# Patient Record
Sex: Female | Born: 1955 | Race: Black or African American | Hispanic: No | Marital: Single | Smoking: Current every day smoker
Health system: Southern US, Community
[De-identification: ages and names within clinical notes are randomized; demographics above are authoritative.]

## PROBLEM LIST (undated history)

## (undated) DIAGNOSIS — N289 Disorder of kidney and ureter, unspecified: Secondary | ICD-10-CM

## (undated) DIAGNOSIS — M329 Systemic lupus erythematosus, unspecified: Secondary | ICD-10-CM

## (undated) DIAGNOSIS — N184 Chronic kidney disease, stage 4 (severe): Secondary | ICD-10-CM

## (undated) DIAGNOSIS — I10 Essential (primary) hypertension: Secondary | ICD-10-CM

## (undated) DIAGNOSIS — I509 Heart failure, unspecified: Secondary | ICD-10-CM

## (undated) DIAGNOSIS — R768 Other specified abnormal immunological findings in serum: Secondary | ICD-10-CM

## (undated) DIAGNOSIS — R7689 Other specified abnormal immunological findings in serum: Secondary | ICD-10-CM

## (undated) DIAGNOSIS — F419 Anxiety disorder, unspecified: Secondary | ICD-10-CM

## (undated) DIAGNOSIS — F1910 Other psychoactive substance abuse, uncomplicated: Secondary | ICD-10-CM

## (undated) DIAGNOSIS — F1410 Cocaine abuse, uncomplicated: Secondary | ICD-10-CM

## (undated) DIAGNOSIS — IMO0002 Reserved for concepts with insufficient information to code with codable children: Secondary | ICD-10-CM

## (undated) DIAGNOSIS — Z5189 Encounter for other specified aftercare: Secondary | ICD-10-CM

## (undated) HISTORY — DX: Cocaine abuse, uncomplicated: F14.10

## (undated) HISTORY — DX: Other specified abnormal immunological findings in serum: R76.89

## (undated) HISTORY — DX: Other psychoactive substance abuse, uncomplicated: F19.10

## (undated) HISTORY — DX: Other specified abnormal immunological findings in serum: R76.8

## (undated) HISTORY — DX: Chronic kidney disease, stage 4 (severe): N18.4

## (undated) HISTORY — DX: Essential (primary) hypertension: I10

---

## 1997-10-01 ENCOUNTER — Emergency Department (HOSPITAL_COMMUNITY): Admission: EM | Admit: 1997-10-01 | Discharge: 1997-10-01 | Payer: Self-pay | Admitting: Emergency Medicine

## 1998-02-22 ENCOUNTER — Emergency Department (HOSPITAL_COMMUNITY): Admission: EM | Admit: 1998-02-22 | Discharge: 1998-02-22 | Payer: Self-pay | Admitting: Emergency Medicine

## 1998-10-28 ENCOUNTER — Emergency Department (HOSPITAL_COMMUNITY): Admission: EM | Admit: 1998-10-28 | Discharge: 1998-10-28 | Payer: Self-pay | Admitting: *Deleted

## 1999-10-03 ENCOUNTER — Encounter: Payer: Self-pay | Admitting: Emergency Medicine

## 1999-10-03 ENCOUNTER — Emergency Department (HOSPITAL_COMMUNITY): Admission: EM | Admit: 1999-10-03 | Discharge: 1999-10-03 | Payer: Self-pay | Admitting: Emergency Medicine

## 2001-11-29 ENCOUNTER — Encounter: Payer: Self-pay | Admitting: Emergency Medicine

## 2001-11-29 ENCOUNTER — Emergency Department (HOSPITAL_COMMUNITY): Admission: EM | Admit: 2001-11-29 | Discharge: 2001-11-29 | Payer: Self-pay | Admitting: Emergency Medicine

## 2002-01-11 ENCOUNTER — Emergency Department (HOSPITAL_COMMUNITY): Admission: EM | Admit: 2002-01-11 | Discharge: 2002-01-11 | Payer: Self-pay | Admitting: Emergency Medicine

## 2002-08-18 ENCOUNTER — Encounter: Payer: Self-pay | Admitting: Emergency Medicine

## 2002-08-18 ENCOUNTER — Inpatient Hospital Stay (HOSPITAL_COMMUNITY): Admission: EM | Admit: 2002-08-18 | Discharge: 2002-08-25 | Payer: Self-pay | Admitting: *Deleted

## 2002-11-07 ENCOUNTER — Emergency Department (HOSPITAL_COMMUNITY): Admission: EM | Admit: 2002-11-07 | Discharge: 2002-11-07 | Payer: Self-pay | Admitting: Emergency Medicine

## 2002-11-08 ENCOUNTER — Ambulatory Visit (HOSPITAL_BASED_OUTPATIENT_CLINIC_OR_DEPARTMENT_OTHER): Admission: RE | Admit: 2002-11-08 | Discharge: 2002-11-09 | Payer: Self-pay | Admitting: Orthopedic Surgery

## 2002-11-08 ENCOUNTER — Encounter: Payer: Self-pay | Admitting: Emergency Medicine

## 2003-02-09 ENCOUNTER — Emergency Department (HOSPITAL_COMMUNITY): Admission: EM | Admit: 2003-02-09 | Discharge: 2003-02-09 | Payer: Self-pay | Admitting: Emergency Medicine

## 2003-02-09 ENCOUNTER — Encounter: Payer: Self-pay | Admitting: Emergency Medicine

## 2003-02-10 ENCOUNTER — Inpatient Hospital Stay (HOSPITAL_COMMUNITY): Admission: EM | Admit: 2003-02-10 | Discharge: 2003-02-18 | Payer: Self-pay | Admitting: Psychiatry

## 2004-07-15 ENCOUNTER — Emergency Department (HOSPITAL_COMMUNITY): Admission: EM | Admit: 2004-07-15 | Discharge: 2004-07-15 | Payer: Self-pay | Admitting: Emergency Medicine

## 2004-07-24 ENCOUNTER — Inpatient Hospital Stay (HOSPITAL_COMMUNITY): Admission: RE | Admit: 2004-07-24 | Discharge: 2004-07-30 | Payer: Self-pay | Admitting: Psychiatry

## 2004-07-24 ENCOUNTER — Ambulatory Visit: Payer: Self-pay | Admitting: Psychiatry

## 2007-07-13 ENCOUNTER — Emergency Department (HOSPITAL_COMMUNITY): Admission: EM | Admit: 2007-07-13 | Discharge: 2007-07-13 | Payer: Self-pay | Admitting: Emergency Medicine

## 2008-06-07 ENCOUNTER — Emergency Department (HOSPITAL_COMMUNITY): Admission: EM | Admit: 2008-06-07 | Discharge: 2008-06-07 | Payer: Self-pay | Admitting: Emergency Medicine

## 2008-06-16 ENCOUNTER — Ambulatory Visit: Payer: Self-pay | Admitting: Internal Medicine

## 2008-06-20 ENCOUNTER — Ambulatory Visit: Payer: Self-pay | Admitting: *Deleted

## 2008-07-03 ENCOUNTER — Emergency Department (HOSPITAL_COMMUNITY): Admission: EM | Admit: 2008-07-03 | Discharge: 2008-07-03 | Payer: Self-pay | Admitting: Emergency Medicine

## 2008-09-01 ENCOUNTER — Ambulatory Visit: Payer: Self-pay | Admitting: Internal Medicine

## 2008-09-01 ENCOUNTER — Encounter (INDEPENDENT_AMBULATORY_CARE_PROVIDER_SITE_OTHER): Payer: Self-pay | Admitting: Internal Medicine

## 2008-09-01 LAB — CONVERTED CEMR LAB
ALT: 17 units/L (ref 0–35)
AST: 16 units/L (ref 0–37)
Albumin: 4.4 g/dL (ref 3.5–5.2)
Anti Nuclear Antibody(ANA): POSITIVE — AB
Basophils Absolute: 0 10*3/uL (ref 0.0–0.1)
Calcium: 9.5 mg/dL (ref 8.4–10.5)
Creatinine, Ser: 1.02 mg/dL (ref 0.40–1.20)
Eosinophils Relative: 3 % (ref 0–5)
Glucose, Bld: 100 mg/dL — ABNORMAL HIGH (ref 70–99)
MCHC: 33.6 g/dL (ref 30.0–36.0)
Monocytes Relative: 13 % — ABNORMAL HIGH (ref 3–12)
Neutrophils Relative %: 57 % (ref 43–77)
Potassium: 4.1 meq/L (ref 3.5–5.3)
RDW: 14.3 % (ref 11.5–15.5)
Total Protein: 8 g/dL (ref 6.0–8.3)
WBC: 6 10*3/uL (ref 4.0–10.5)

## 2008-09-23 ENCOUNTER — Encounter: Admission: RE | Admit: 2008-09-23 | Discharge: 2008-09-23 | Payer: Self-pay | Admitting: Internal Medicine

## 2008-12-26 ENCOUNTER — Ambulatory Visit: Payer: Self-pay | Admitting: Family Medicine

## 2009-12-28 ENCOUNTER — Emergency Department (HOSPITAL_COMMUNITY): Admission: EM | Admit: 2009-12-28 | Discharge: 2009-12-28 | Payer: Self-pay | Admitting: Emergency Medicine

## 2010-01-01 ENCOUNTER — Emergency Department (HOSPITAL_COMMUNITY): Admission: EM | Admit: 2010-01-01 | Discharge: 2010-01-01 | Payer: Self-pay | Admitting: Emergency Medicine

## 2010-01-01 ENCOUNTER — Telehealth (INDEPENDENT_AMBULATORY_CARE_PROVIDER_SITE_OTHER): Payer: Self-pay | Admitting: *Deleted

## 2010-01-03 ENCOUNTER — Emergency Department (HOSPITAL_COMMUNITY): Admission: EM | Admit: 2010-01-03 | Discharge: 2010-01-03 | Payer: Self-pay | Admitting: Emergency Medicine

## 2010-07-08 ENCOUNTER — Encounter: Payer: Self-pay | Admitting: Internal Medicine

## 2010-07-19 NOTE — Progress Notes (Signed)
Summary: triage/bloody urine/clots/pain  Phone Note Call from Patient   Caller: Patient Reason for Call: Talk to Nurse Summary of Call: Patient presents to office wanting to see a provider for bright red blood and clots in urine when she tries to urinate. She also states she has a small amount of bloody drainage when she isn't trying to urinate..Patient walking slightly slumped over guarding lower abd. where she states she is having pain. She was seen at ED on 7/14 and treated for UTI with Macrobid and Pyridium. She states she did not see this amount of blood in her urine at that time, and the pain has gotten worse. Patient advised to return to ED as we do not have any openings until 3:45  this afternoon and she needs further evaluation before then.  Patient states understanding and will return to ED via cab. Initial call taken by: Verne Spurr,  January 01, 2010 9:40 AM

## 2010-09-01 LAB — GC/CHLAMYDIA PROBE AMP, GENITAL
Chlamydia, DNA Probe: NEGATIVE
GC Probe Amp, Genital: NEGATIVE

## 2010-09-01 LAB — POCT I-STAT, CHEM 8
Calcium, Ion: 1.11 mmol/L — ABNORMAL LOW (ref 1.12–1.32)
Chloride: 110 mEq/L (ref 96–112)
Creatinine, Ser: 1.8 mg/dL — ABNORMAL HIGH (ref 0.4–1.2)
Hemoglobin: 14.6 g/dL (ref 12.0–15.0)
Sodium: 142 mEq/L (ref 135–145)

## 2010-09-01 LAB — URINE CULTURE: Colony Count: 25000

## 2010-09-01 LAB — URINALYSIS, ROUTINE W REFLEX MICROSCOPIC
Bilirubin Urine: NEGATIVE
Glucose, UA: 100 mg/dL — AB
Glucose, UA: NEGATIVE mg/dL
Ketones, ur: NEGATIVE mg/dL
Leukocytes, UA: NEGATIVE
Nitrite: NEGATIVE
Protein, ur: 100 mg/dL — AB
Specific Gravity, Urine: 1.023 (ref 1.005–1.030)
Specific Gravity, Urine: 1.006 (ref 1.005–1.030)
pH: 6.5 (ref 5.0–8.0)
pH: 6.5 (ref 5.0–8.0)

## 2010-09-01 LAB — WET PREP, GENITAL
Trich, Wet Prep: NONE SEEN
Yeast Wet Prep HPF POC: NONE SEEN

## 2010-09-01 LAB — DIFFERENTIAL
Basophils Absolute: 0.1 10*3/uL (ref 0.0–0.1)
Basophils Relative: 1 % (ref 0–1)
Eosinophils Relative: 2 % (ref 0–5)
Lymphocytes Relative: 23 % (ref 12–46)
Lymphs Abs: 1.1 10*3/uL (ref 0.7–4.0)
Monocytes Absolute: 0.4 10*3/uL (ref 0.1–1.0)
Monocytes Relative: 8 % (ref 3–12)

## 2010-09-01 LAB — CBC
MCH: 31.4 pg (ref 26.0–34.0)
RBC: 4.51 MIL/uL (ref 3.87–5.11)
RDW: 13.6 % (ref 11.5–15.5)

## 2010-09-01 LAB — URINE MICROSCOPIC-ADD ON

## 2010-09-01 LAB — PREGNANCY, URINE: Preg Test, Ur: NEGATIVE

## 2010-09-02 LAB — URINE MICROSCOPIC-ADD ON

## 2010-09-02 LAB — POCT I-STAT, CHEM 8
BUN: 21 mg/dL (ref 6–23)
Calcium, Ion: 1.18 mmol/L (ref 1.12–1.32)
Chloride: 111 meq/L (ref 96–112)
Creatinine, Ser: 1.4 mg/dL — ABNORMAL HIGH (ref 0.4–1.2)
Glucose, Bld: 87 mg/dL (ref 70–99)
HCT: 46 % (ref 36.0–46.0)
Hemoglobin: 15.6 g/dL — ABNORMAL HIGH (ref 12.0–15.0)
Potassium: 4.1 meq/L (ref 3.5–5.1)
Sodium: 145 meq/L (ref 135–145)
TCO2: 28 mmol/L (ref 0–100)

## 2010-09-02 LAB — URINALYSIS, ROUTINE W REFLEX MICROSCOPIC
Glucose, UA: NEGATIVE mg/dL
Urobilinogen, UA: 0.2 mg/dL (ref 0.0–1.0)
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
## 2011-03-07 LAB — POCT CARDIAC MARKERS
Myoglobin, poc: 83.3
Operator id: 151321

## 2011-03-07 LAB — CBC
HCT: 44.3
Hemoglobin: 14.6
MCHC: 33.1
MCV: 93.3
Platelets: 221
RBC: 4.75
RDW: 13.1
WBC: 4.4

## 2011-03-07 LAB — DIFFERENTIAL
Basophils Absolute: 0.1
Basophils Relative: 1
Eosinophils Absolute: 0.1
Eosinophils Relative: 2
Lymphocytes Relative: 46
Lymphs Abs: 2
Monocytes Absolute: 0.5
Monocytes Relative: 11
Neutro Abs: 1.8
Neutrophils Relative %: 40 — ABNORMAL LOW

## 2011-03-07 LAB — POCT I-STAT CREATININE
Creatinine, Ser: 1.1
Operator id: 151321

## 2011-03-07 LAB — I-STAT 8, (EC8 V) (CONVERTED LAB)
Acid-Base Excess: 1
BUN: 14
Bicarbonate: 27.3 — ABNORMAL HIGH
Glucose, Bld: 83
HCT: 49 — ABNORMAL HIGH
Operator id: 151321
pH, Ven: 7.346 — ABNORMAL HIGH

## 2011-03-07 LAB — D-DIMER, QUANTITATIVE: D-Dimer, Quant: <0.22

## 2011-03-21 LAB — URINALYSIS, ROUTINE W REFLEX MICROSCOPIC
Glucose, UA: NEGATIVE mg/dL
Hgb urine dipstick: NEGATIVE
Ketones, ur: NEGATIVE mg/dL
pH: 6 (ref 5.0–8.0)

## 2011-03-21 LAB — POCT I-STAT 3, ART BLOOD GAS (G3+)
Bicarbonate: 25.8 mEq/L — ABNORMAL HIGH (ref 20.0–24.0)
O2 Saturation: 93 %
TCO2: 27 mmol/L (ref 0–100)
pCO2 arterial: 42.7 mmHg (ref 35.0–45.0)
pH, Arterial: 7.389 (ref 7.350–7.400)

## 2011-03-21 LAB — WET PREP, GENITAL
Clue Cells Wet Prep HPF POC: NONE SEEN
Trich, Wet Prep: NONE SEEN

## 2011-03-21 LAB — URINE CULTURE: Colony Count: 7000

## 2012-10-15 ENCOUNTER — Emergency Department (HOSPITAL_COMMUNITY)
Admission: EM | Admit: 2012-10-15 | Discharge: 2012-10-15 | Disposition: A | Discharge status: Home / Self Care | Payer: Medicaid Other | Attending: Emergency Medicine | Admitting: Emergency Medicine

## 2012-10-15 ENCOUNTER — Emergency Department (HOSPITAL_COMMUNITY): Payer: Medicaid Other

## 2012-10-15 ENCOUNTER — Encounter (HOSPITAL_COMMUNITY): Payer: Self-pay | Admitting: Emergency Medicine

## 2012-10-15 DIAGNOSIS — Z23 Encounter for immunization: Secondary | ICD-10-CM

## 2012-10-15 DIAGNOSIS — S71109A Unspecified open wound, unspecified thigh, initial encounter: Secondary | ICD-10-CM | POA: Insufficient documentation

## 2012-10-15 DIAGNOSIS — S71009A Unspecified open wound, unspecified hip, initial encounter: Secondary | ICD-10-CM | POA: Insufficient documentation

## 2012-10-15 DIAGNOSIS — Y92410 Unspecified street and highway as the place of occurrence of the external cause: Secondary | ICD-10-CM | POA: Insufficient documentation

## 2012-10-15 DIAGNOSIS — W540XXA Bitten by dog, initial encounter: Secondary | ICD-10-CM | POA: Insufficient documentation

## 2012-10-15 DIAGNOSIS — Y9301 Activity, walking, marching and hiking: Secondary | ICD-10-CM | POA: Insufficient documentation

## 2012-10-15 DIAGNOSIS — S71151A Open bite, right thigh, initial encounter: Secondary | ICD-10-CM

## 2012-10-15 DIAGNOSIS — IMO0002 Reserved for concepts with insufficient information to code with codable children: Secondary | ICD-10-CM | POA: Insufficient documentation

## 2012-10-15 MED ORDER — RABIES IMMUNE GLOBULIN 150 UNIT/ML IM INJ
20.0000 [IU]/kg | INJECTION | Freq: Once | INTRAMUSCULAR | Status: AC
  Administered 2012-10-15: 1050 [IU] via INTRAMUSCULAR
  Filled 2012-10-15: qty 8

## 2012-10-15 MED ORDER — AMOXICILLIN-POT CLAVULANATE 500-125 MG PO TABS: 1.0000 | ORAL_TABLET | Freq: Three times a day (TID) | ORAL | Status: DC

## 2012-10-15 MED ORDER — TETANUS-DIPHTH-ACELL PERTUSSIS 5-2.5-18.5 LF-MCG/0.5 IM SUSP
0.5000 mL | Freq: Once | INTRAMUSCULAR | Status: AC
  Administered 2012-10-15: 0.5 mL via INTRAMUSCULAR
  Filled 2012-10-15: qty 0.5

## 2012-10-15 MED ORDER — LIDOCAINE-EPINEPHRINE 1 %-1:100000 IJ SOLN
10.0000 mL | Freq: Once | INTRAMUSCULAR | Status: AC
  Administered 2012-10-15: 10 mL via INTRADERMAL
  Filled 2012-10-15: qty 1

## 2012-10-15 MED ORDER — HYDROCODONE-ACETAMINOPHEN 5-325 MG PO TABS: 1.0000 | ORAL_TABLET | Freq: Four times a day (QID) | ORAL | Status: DC | PRN

## 2012-10-15 MED ORDER — RABIES VACCINE, PCEC IM SUSR
1.0000 mL | Freq: Once | INTRAMUSCULAR | Status: AC
  Administered 2012-10-15: 1 mL via INTRAMUSCULAR
  Filled 2012-10-15: qty 1

## 2012-10-15 MED ORDER — OXYCODONE-ACETAMINOPHEN 5-325 MG PO TABS
2.0000 | ORAL_TABLET | Freq: Once | ORAL | Status: AC
  Administered 2012-10-15: 2 via ORAL
  Filled 2012-10-15: qty 2
  Filled 2012-10-15: qty 1

## 2012-10-15 NOTE — ED Provider Notes (Signed)
Pt with acute onset of dog bite this AM - states she was bit by unknown dog to the R leg and the R forearm (scratch).  She has hx of lupus - no immunosuppression at thsi time.  This attack was unprovoked.  On exam has large tissue defect to the R anterior thigh X 2, no sutures possible, no active bleeding, no obvious FB's.  No repair indicated as there is missing large piece of tissue - irrgate, cleanse, and cover with sterile gauze - abx, rabies tx and f/u for ongoing rabies tx.    I saw and evaluated the patient, reviewed the resident's note and I agree with the findings and plan.   Johnna Acosta, MD 10/15/12 [phone]

## 2012-10-15 NOTE — ED Notes (Signed)
Patient states that she was walking down the road and got bit by an unknown dog.   Patient unsure of kind or whose dog it is.   Patient states that she is not interested in reporting bite.
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
## 2012-10-16 NOTE — ED Provider Notes (Signed)
Medical screening examination/treatment/procedure(s) were conducted as a shared visit with resident and myself.  I personally evaluated the patient during the encounter.  Please see my separate respective documentation pertaining to this patient encounter   Johnna Acosta, MD 10/16/12 [phone]

## 2012-10-18 ENCOUNTER — Emergency Department (INDEPENDENT_AMBULATORY_CARE_PROVIDER_SITE_OTHER)
Admission: EM | Admit: 2012-10-18 | Discharge: 2012-10-18 | Disposition: A | Discharge status: Home / Self Care | Payer: Medicaid Other | Source: Home / Self Care

## 2012-10-18 DIAGNOSIS — Z203 Contact with and (suspected) exposure to rabies: Secondary | ICD-10-CM

## 2012-10-18 MED ORDER — RABIES VACCINE, PCEC IM SUSR
INTRAMUSCULAR | Status: AC
  Filled 2012-10-18: qty 1

## 2012-10-18 MED ORDER — RABIES VACCINE, PCEC IM SUSR
1.0000 mL | Freq: Once | INTRAMUSCULAR | Status: AC
  Administered 2012-10-18: 1 mL via INTRAMUSCULAR

## 2012-10-18 NOTE — ED Notes (Signed)
Patient presents for day 3 of rabies treatment.

## 2012-10-22 ENCOUNTER — Emergency Department (INDEPENDENT_AMBULATORY_CARE_PROVIDER_SITE_OTHER)
Admission: EM | Admit: 2012-10-22 | Discharge: 2012-10-22 | Disposition: A | Discharge status: Home / Self Care | Payer: Medicaid Other | Source: Home / Self Care

## 2012-10-22 ENCOUNTER — Encounter (HOSPITAL_COMMUNITY): Payer: Self-pay | Admitting: Emergency Medicine

## 2012-10-22 DIAGNOSIS — Z203 Contact with and (suspected) exposure to rabies: Secondary | ICD-10-CM

## 2012-10-22 MED ORDER — RABIES VACCINE, PCEC IM SUSR
1.0000 mL | Freq: Once | INTRAMUSCULAR | Status: AC
  Administered 2012-10-22: 1 mL via INTRAMUSCULAR

## 2012-10-22 MED ORDER — RABIES VACCINE, PCEC IM SUSR
INTRAMUSCULAR | Status: AC
  Filled 2012-10-22: qty 1

## 2012-10-22 MED ORDER — HYDROCODONE-ACETAMINOPHEN 5-325 MG PO TABS: 1.0000 | ORAL_TABLET | Freq: Three times a day (TID) | ORAL | Status: DC | PRN

## 2012-10-22 NOTE — ED Notes (Addendum)
Wound was assessed by Dr. Gunnar Bulla no signs of infection.  Wound was redressed with antibiotic ointment, telfa and kerlix wrap. Pt given refill on pain medication.  Mw,cma

## 2012-10-22 NOTE — ED Notes (Signed)
Pt is here for rabies injection.  Pt states that she is still having a lot of pain from dog bite and is requesting refill on pain medication.

## 2013-03-29 ENCOUNTER — Emergency Department (HOSPITAL_COMMUNITY)
Admission: EM | Admit: 2013-03-29 | Discharge: 2013-03-29 | Disposition: A | Discharge status: Home / Self Care | Payer: Medicaid Other | Attending: Emergency Medicine | Admitting: Emergency Medicine

## 2013-03-29 ENCOUNTER — Encounter (HOSPITAL_COMMUNITY): Payer: Self-pay | Admitting: Emergency Medicine

## 2013-03-29 DIAGNOSIS — Z91199 Patient's noncompliance with other medical treatment and regimen due to unspecified reason: Secondary | ICD-10-CM | POA: Insufficient documentation

## 2013-03-29 DIAGNOSIS — B86 Scabies: Secondary | ICD-10-CM

## 2013-03-29 DIAGNOSIS — Z79899 Other long term (current) drug therapy: Secondary | ICD-10-CM | POA: Insufficient documentation

## 2013-03-29 DIAGNOSIS — F17200 Nicotine dependence, unspecified, uncomplicated: Secondary | ICD-10-CM | POA: Insufficient documentation

## 2013-03-29 DIAGNOSIS — N949 Unspecified condition associated with female genital organs and menstrual cycle: Secondary | ICD-10-CM | POA: Insufficient documentation

## 2013-03-29 DIAGNOSIS — M329 Systemic lupus erythematosus, unspecified: Secondary | ICD-10-CM | POA: Insufficient documentation

## 2013-03-29 DIAGNOSIS — Z888 Allergy status to other drugs, medicaments and biological substances status: Secondary | ICD-10-CM | POA: Insufficient documentation

## 2013-03-29 DIAGNOSIS — Z9119 Patient's noncompliance with other medical treatment and regimen: Secondary | ICD-10-CM | POA: Insufficient documentation

## 2013-03-29 DIAGNOSIS — F1410 Cocaine abuse, uncomplicated: Secondary | ICD-10-CM | POA: Insufficient documentation

## 2013-03-29 DIAGNOSIS — I10 Essential (primary) hypertension: Secondary | ICD-10-CM | POA: Insufficient documentation

## 2013-03-29 DIAGNOSIS — F1490 Cocaine use, unspecified, uncomplicated: Secondary | ICD-10-CM

## 2013-03-29 HISTORY — DX: Essential (primary) hypertension: I10

## 2013-03-29 LAB — COMPREHENSIVE METABOLIC PANEL
ALT: 13 U/L (ref 0–35)
Alkaline Phosphatase: 66 U/L (ref 39–117)
Chloride: 104 mEq/L (ref 96–112)
GFR calc Af Amer: 78 mL/min — ABNORMAL LOW (ref >90)
Glucose, Bld: 90 mg/dL (ref 70–99)
Potassium: 4.8 mEq/L (ref 3.5–5.1)
Sodium: 141 mEq/L (ref 135–145)
Total Protein: 8.4 g/dL — ABNORMAL HIGH (ref 6.0–8.3)

## 2013-03-29 LAB — CBC WITH DIFFERENTIAL/PLATELET
Eosinophils Absolute: 0.1 10*3/uL (ref 0.0–0.7)
Lymphocytes Relative: 45 % (ref 12–46)
Lymphs Abs: 1.9 10*3/uL (ref 0.7–4.0)
MCH: 31.1 pg (ref 26.0–34.0)
Neutro Abs: 1.8 10*3/uL (ref 1.7–7.7)
Neutrophils Relative %: 44 % (ref 43–77)
Platelets: 174 10*3/uL (ref 150–400)
RBC: 4.25 MIL/uL (ref 3.87–5.11)
WBC: 4.2 10*3/uL (ref 4.0–10.5)

## 2013-03-29 LAB — URINALYSIS, ROUTINE W REFLEX MICROSCOPIC
Bilirubin Urine: NEGATIVE
Glucose, UA: NEGATIVE mg/dL
Ketones, ur: NEGATIVE mg/dL
Nitrite: NEGATIVE
pH: 6.5 (ref 5.0–8.0)

## 2013-03-29 MED ORDER — PERMETHRIN 5 % EX CREA: TOPICAL_CREAM | CUTANEOUS | Status: DC

## 2013-03-29 MED ORDER — HYDROCHLOROTHIAZIDE 25 MG PO TABS: 25.0000 mg | ORAL_TABLET | Freq: Every day | ORAL | Status: DC

## 2013-03-29 NOTE — ED Notes (Signed)
Patient was given Clonidine at doctors office approx 9min ago. BP has decreased from [id number] to 123XX123 systolic.

## 2013-03-29 NOTE — ED Notes (Signed)
Pt here via EMS c/o htn from PCP; pt given meds with some effect; pt sts not taking meds x month and admits to crack cocaine use this weekend

## 2013-03-29 NOTE — ED Provider Notes (Signed)
TIME SEEN: 4:59 PM  CHIEF COMPLAINT: Lupus flare, hypertension  HPI: Pt is a 57 y.o. female with a history of lupus, hypertension who presents to the emergency department from her primary care physician's office with elevated blood pressure. Per patient, she was seen at her primary care doctor's office today because she is complaining of diffuse pain which is similar to her prior lupus flares. There she was noted to be hypertensive with a systolic blood pressure in the 220s. She was given clonidine and sent to the emergency department by ambulance. Here her blood pressure is 157/93. She denies any headache, blurry vision, chest pain or shortness of breath, numbness, tingling or focal weakness. She states she does take blood pressure medication but cannot recall the name but ran out of this medication 3 days ago. She has recently used crack cocaine.  Patient also complains of pelvic pain for several months. She states her pain is worse with sex. Denies any vaginal bleeding, discharge, dysuria or hematuria. She states that she does not want evaluation for this in the ED but will followup with her primary care physician.  She also complains of a diffuse rash and states she thinks she is exposed to bad bugs and she lives at [place]. She reports the rash is paretic. No associated discharge or fever.  ROS: See HPI Constitutional: no fever  Eyes: no drainage  ENT: no runny nose   Cardiovascular:  no chest pain  Resp: no SOB  GI: no vomiting GU: no dysuria Integumentary: no rash  Allergy: no hives  Musculoskeletal: no leg swelling  Neurological: no slurred speech ROS otherwise negative  PAST MEDICAL HISTORY/PAST SURGICAL HISTORY:  Past Medical History  Diagnosis Date  . Hypertension     MEDICATIONS:  Prior to Admission medications   Medication Sig Start Date End Date Taking? Authorizing Provider  amoxicillin-clavulanate (AUGMENTIN) 500-125 MG per tablet Take 1 tablet (500 mg total) by  mouth every 8 (eight) hours. 10/15/12   Marco Collie, MD  HYDROcodone-acetaminophen (NORCO/VICODIN) 5-325 MG per tablet Take 1 tablet by mouth every 8 (eight) hours as needed for pain. 10/22/12   Randa Spike, MD    ALLERGIES:  Allergies  Allergen Reactions  . Aspirin     Makes sick on stomach     SOCIAL HISTORY:  History  Substance Use Topics  . Smoking status: Current Every Day Smoker  . Smokeless tobacco: Not on file  . Alcohol Use: No    FAMILY HISTORY: History reviewed. No pertinent family history.  EXAM: BP 157/93  Pulse 64  Temp(Src) 97.9 F (36.6 C) (Oral)  Resp 16  Ht 5\' 2"  (1.575 m)  Wt 111 lb 14.4 oz (50.758 kg)  BMI 20.46 kg/m2  SpO2 100% CONSTITUTIONAL: Alert and oriented and responds appropriately to questions. Well-appearing; well-nourished; no apparent distress HEAD: Normocephalic EYES: Conjunctivae clear, PERRL ENT: normal nose; no rhinorrhea; moist mucous membranes; pharynx without lesions noted; poor dentition NECK: Supple, no meningismus, no LAD  CARD: RRR; S1 and S2 appreciated; no murmurs, no clicks, no rubs, no gallops RESP: Normal chest excursion without splinting or tachypnea; breath sounds clear and equal bilaterally; no wheezes, no rhonchi, no rales,  ABD/GI: Normal bowel sounds; non-distended; soft, non-tender, no rebound, no guarding BACK:  The back appears normal and is non-tender to palpation, there is no CVA tenderness EXT: Normal ROM in all joints; non-tender to palpation; no edema; normal capillary refill; no cyanosis    SKIN: Normal color for age and race;  warm; multiple small papules and lesions that are consistent with burrowing from scabies; no cellulitis, fluctuance, discharge NEURO: Moves all extremities equally, normal gait, no pronator drift, sensation to light touch intact diffusely, cranial nerves II through XII intact PSYCH: The patient's mood and manner are appropriate. Grooming and personal hygiene are appropriate.  MEDICAL  DECISION MAKING: Patient here with hypertension who is been noncompliant with medication and a using crack cocaine. She is currently asymptomatic other than diffuse arm and leg pain that is similar to her prior lupus flares. Her blood pressure has improved. Labs including creatinine, urinalysis are unremarkable. We'll send patient home with two-week prescription for hydrochlorothiazide. Patient has been on propanolol in the past but given her cocaine use I do not feel this is safe for her to continue this medication have explained this to her. Have also explained to patient that she will need to followup with her primary care physician. Counseled on abstaining from cocaine use. Discussed with patient that she will need to followup with her doctor to be treated for her chronic pain.  Will treat with permethrin for scabies. I do not feel there is any need for admission at this time and no life-threatening illness. Given return precautions. Patient verbalizes understanding and is comfortable with this plan.        [place], DO 03/29/13 [phone]

## 2013-07-13 DIAGNOSIS — F1910 Other psychoactive substance abuse, uncomplicated: Secondary | ICD-10-CM | POA: Insufficient documentation

## 2013-07-13 DIAGNOSIS — F341 Dysthymic disorder: Secondary | ICD-10-CM | POA: Insufficient documentation

## 2014-01-12 ENCOUNTER — Encounter (HOSPITAL_COMMUNITY): Payer: Self-pay | Admitting: Emergency Medicine

## 2014-01-12 ENCOUNTER — Emergency Department (HOSPITAL_COMMUNITY): Payer: Medicaid Other

## 2014-01-12 ENCOUNTER — Emergency Department (HOSPITAL_COMMUNITY)
Admission: EM | Admit: 2014-01-12 | Discharge: 2014-01-12 | Disposition: A | Discharge status: Home / Self Care | Payer: Medicaid Other | Attending: Emergency Medicine | Admitting: Emergency Medicine

## 2014-01-12 DIAGNOSIS — F17200 Nicotine dependence, unspecified, uncomplicated: Secondary | ICD-10-CM | POA: Diagnosis not present

## 2014-01-12 DIAGNOSIS — Z91040 Latex allergy status: Secondary | ICD-10-CM | POA: Diagnosis not present

## 2014-01-12 DIAGNOSIS — R5381 Other malaise: Secondary | ICD-10-CM | POA: Diagnosis not present

## 2014-01-12 DIAGNOSIS — R0789 Other chest pain: Secondary | ICD-10-CM | POA: Diagnosis not present

## 2014-01-12 DIAGNOSIS — Z8739 Personal history of other diseases of the musculoskeletal system and connective tissue: Secondary | ICD-10-CM | POA: Insufficient documentation

## 2014-01-12 DIAGNOSIS — R5383 Other fatigue: Secondary | ICD-10-CM

## 2014-01-12 DIAGNOSIS — I10 Essential (primary) hypertension: Secondary | ICD-10-CM | POA: Insufficient documentation

## 2014-01-12 DIAGNOSIS — R079 Chest pain, unspecified: Secondary | ICD-10-CM | POA: Diagnosis present

## 2014-01-12 DIAGNOSIS — Z79899 Other long term (current) drug therapy: Secondary | ICD-10-CM | POA: Insufficient documentation

## 2014-01-12 HISTORY — DX: Reserved for concepts with insufficient information to code with codable children: IMO0002

## 2014-01-12 HISTORY — DX: Systemic lupus erythematosus, unspecified: M32.9

## 2014-01-12 LAB — URINALYSIS, ROUTINE W REFLEX MICROSCOPIC
Bilirubin Urine: NEGATIVE
Glucose, UA: NEGATIVE mg/dL
Hgb urine dipstick: NEGATIVE
Ketones, ur: NEGATIVE mg/dL
NITRITE: NEGATIVE
PROTEIN: NEGATIVE mg/dL
SPECIFIC GRAVITY, URINE: 1.017 (ref 1.005–1.030)
UROBILINOGEN UA: 1 mg/dL (ref 0.0–1.0)
pH: 6 (ref 5.0–8.0)

## 2014-01-12 LAB — CBG MONITORING, ED: Glucose-Capillary: 142 mg/dL — ABNORMAL HIGH (ref 70–99)

## 2014-01-12 LAB — URINE MICROSCOPIC-ADD ON

## 2014-01-12 LAB — COMPREHENSIVE METABOLIC PANEL
ALBUMIN: 3.4 g/dL — AB (ref 3.5–5.2)
ALT: <5 U/L (ref 0–35)
ANION GAP: 14 (ref 5–15)
AST: 12 U/L (ref 0–37)
Alkaline Phosphatase: 54 U/L (ref 39–117)
BUN: 17 mg/dL (ref 6–23)
CO2: 25 mEq/L (ref 19–32)
CREATININE: 1.32 mg/dL — AB (ref 0.50–1.10)
Calcium: 8.8 mg/dL (ref 8.4–10.5)
Chloride: 101 mEq/L (ref 96–112)
GFR calc non Af Amer: 44 mL/min — ABNORMAL LOW (ref >90)
GFR, EST AFRICAN AMERICAN: 51 mL/min — AB (ref >90)
Glucose, Bld: 124 mg/dL — ABNORMAL HIGH (ref 70–99)
Potassium: 3.1 mEq/L — ABNORMAL LOW (ref 3.7–5.3)
Sodium: 140 mEq/L (ref 137–147)
Total Protein: 7.2 g/dL (ref 6.0–8.3)

## 2014-01-12 LAB — CBC
HCT: 35.6 % — ABNORMAL LOW (ref 36.0–46.0)
Hemoglobin: 12.1 g/dL (ref 12.0–15.0)
MCH: 30.3 pg (ref 26.0–34.0)
MCHC: 34 g/dL (ref 30.0–36.0)
MCV: 89 fL (ref 78.0–100.0)
Platelets: 197 10*3/uL (ref 150–400)
RBC: 4 MIL/uL (ref 3.87–5.11)
RDW: 13 % (ref 11.5–15.5)
WBC: 5 10*3/uL (ref 4.0–10.5)

## 2014-01-12 LAB — LIPASE, BLOOD: Lipase: 24 U/L (ref 11–59)

## 2014-01-12 LAB — I-STAT TROPONIN, ED
Troponin i, poc: 0.01 ng/mL (ref 0.00–0.08)
Troponin i, poc: 0.01 ng/mL (ref 0.00–0.08)

## 2014-01-12 SURGERY — LEFT HEART CATHETERIZATION WITH CORONARY ANGIOGRAM
Anesthesia: LOCAL

## 2014-01-12 MED ORDER — ONDANSETRON 4 MG PO TBDP
4.0000 mg | ORAL_TABLET | Freq: Once | ORAL | Status: AC
  Administered 2014-01-12: 4 mg via ORAL
  Filled 2014-01-12: qty 1

## 2014-01-12 MED ORDER — HYDROCODONE-ACETAMINOPHEN 5-325 MG PO TABS
1.0000 | ORAL_TABLET | Freq: Once | ORAL | Status: AC
  Administered 2014-01-12: 1 via ORAL
  Filled 2014-01-12: qty 1

## 2014-01-12 MED ORDER — SODIUM CHLORIDE 0.9 % IV BOLUS (SEPSIS)
1000.0000 mL | Freq: Once | INTRAVENOUS | Status: AC
  Administered 2014-01-12: 1000 mL via INTRAVENOUS

## 2014-01-12 MED ORDER — MORPHINE SULFATE 4 MG/ML IJ SOLN
4.0000 mg | Freq: Once | INTRAMUSCULAR | Status: AC
  Administered 2014-01-12: 4 mg via INTRAVENOUS
  Filled 2014-01-12: qty 1

## 2014-01-12 NOTE — ED Notes (Addendum)
Pt told phlebotomy that she had bed bugs-- pt placed on contact precautions

## 2014-01-12 NOTE — ED Notes (Addendum)
Pt had 3 quart bottles of chocolate milk, 2 sandwiches and pieces of pound cake on her person on arrival.  Pt requested to have bottles and food placed in refrigerator, food and beverages placed in POD A refrigerator.

## 2014-01-12 NOTE — Discharge Instructions (Signed)

## 2014-01-12 NOTE — ED Notes (Signed)
Pt presents with onset dizziness and weakness x 1 month that worsened today.  Per EMS, pt had onset of crushing chest pain that began just prior to [facility] admission.  EKG showed T wave elevation, pt transported here.  NTG 0.4mg  given x 1 PTA with pain reducing to 6-7/10.  Pt hypotensive in route.

## 2014-01-12 NOTE — ED Provider Notes (Signed)
CSN: [id number]     Arrival date & time 01/12/14  1439 History   First MD Initiated Contact with Patient 01/12/14 1509     No chief complaint on file.    (Consider location/radiation/quality/duration/timing/severity/associated sxs/prior Treatment) Patient is a 58 y.o. female presenting with chest pain.  Chest Pain Pain location:  Substernal area Pain radiates to:  Does not radiate Pain radiates to the back: no   Pain severity:  Moderate Timing:  Constant Chronicity:  New Associated symptoms: weakness   Associated symptoms: no abdominal pain, no back pain, no cough, no dizziness, no headache, no nausea, no shortness of breath and not vomiting     Past Medical History  Diagnosis Date  . Hypertension   . Lupus    History reviewed. No pertinent past surgical history. History reviewed. No pertinent family history. History  Substance Use Topics  . Smoking status: Current Every Day Smoker  . Smokeless tobacco: Not on file  . Alcohol Use: No   OB History   Grav Para Term Preterm Abortions TAB SAB Ect Mult Living                 Review of Systems  Constitutional: Negative for activity change.  HENT: Negative for congestion.   Respiratory: Negative for cough and shortness of breath.   Cardiovascular: Positive for chest pain. Negative for leg swelling.  Gastrointestinal: Negative for nausea, vomiting, abdominal pain, diarrhea, constipation, blood in stool and abdominal distention.  Genitourinary: Negative for dysuria, flank pain and vaginal discharge.  Musculoskeletal: Negative for back pain.  Skin: Negative for color change.  Neurological: Positive for weakness and light-headedness. Negative for dizziness, syncope and headaches.  Psychiatric/Behavioral: Negative for agitation.      Allergies  Aspirin and Latex  Home Medications   Prior to Admission medications   Medication Sig Start Date End Date Taking? Authorizing Provider  escitalopram (LEXAPRO) 20 MG tablet Take  20 mg by mouth daily.    Historical Provider, MD  gabapentin (NEURONTIN) 300 MG capsule Take 300 mg by mouth 3 (three) times daily.    Historical Provider, MD  hydrochlorothiazide (HYDRODIURIL) 25 MG tablet Take 1 tablet (25 mg total) by mouth daily. 03/29/13   Kristen N Ward, DO  ibuprofen (ADVIL,MOTRIN) 200 MG tablet Take 200 mg by mouth every 6 (six) hours as needed for pain.    Historical Provider, MD  Multiple Vitamin (MULTIVITAMIN WITH MINERALS) TABS tablet Take 1 tablet by mouth daily.    Historical Provider, MD  permethrin (ELIMITE) 5 % cream Apply to affected area once 03/29/13   Kristen N Ward, DO  propranolol (INDERAL) 10 MG tablet Take 10 mg by mouth 3 (three) times daily.    Historical Provider, MD   BP 99/62  Pulse 67  Temp(Src) 98.1 F (36.7 C) (Oral)  Resp 20  Ht 5\' 2"  (1.575 m)  Wt 90 lb (40.824 kg)  BMI 16.46 kg/m2  SpO2 98% Physical Exam  Nursing note and vitals reviewed. Constitutional: She is oriented to person, place, and time. She appears well-developed.  HENT:  Head: Normocephalic.  Eyes: Pupils are equal, round, and reactive to light.  Neck: Neck supple.  Cardiovascular: Normal rate.  Exam reveals no gallop and no friction rub.   No murmur heard. Pulmonary/Chest: Effort normal and breath sounds normal. No respiratory distress.  Abdominal: Soft. She exhibits no distension. There is no tenderness. There is no rebound.  Musculoskeletal: She exhibits no edema.  Neurological: She is alert and oriented to  person, place, and time.  Skin: Skin is warm.  Psychiatric: She has a normal mood and affect.   Cranial nerves III-XII grossly intact Strength 5+/5+ to upper and lower extremities bilaterally with resistance applied, equal distribution noted Strength intact to MCP, PIP, DIP joints of  hand Negative arm drift Fine motor skills intact Heel to knee down shin normal bilaterally Gait proper, proper balance - negative sway, negative drift, negative  step-offs     ED Course  Procedures (including critical care time) Labs Review Labs Reviewed  CBC - Abnormal; Notable for the following:    HCT 35.6 (*)    All other components within normal limits  COMPREHENSIVE METABOLIC PANEL - Abnormal; Notable for the following:    Potassium 3.1 (*)    Glucose, Bld 124 (*)    Creatinine, Ser 1.32 (*)    Albumin 3.4 (*)    Total Bilirubin <0.2 (*)    GFR calc non Af Amer 44 (*)    GFR calc Af Amer 51 (*)    All other components within normal limits  URINALYSIS, ROUTINE W REFLEX MICROSCOPIC - Abnormal; Notable for the following:    APPearance CLOUDY (*)    Leukocytes, UA MODERATE (*)    All other components within normal limits  URINE MICROSCOPIC-ADD ON - Abnormal; Notable for the following:    Squamous Epithelial / LPF FEW (*)    Casts HYALINE CASTS (*)    All other components within normal limits  CBG MONITORING, ED - Abnormal; Notable for the following:    Glucose-Capillary 142 (*)    All other components within normal limits  LIPASE, BLOOD  I-STAT TROPOININ, ED  I-STAT TROPOININ, ED  I-STAT TROPOININ, ED    Imaging Review Dg Chest 2 View  01/12/2014   CLINICAL DATA:  Pre syncope, dizziness, fatigue, shortness of breath, cough and chest pain for few weeks, leg pain, history hypertension, lupus, smoking  EXAM: CHEST  2 VIEW  COMPARISON:  06/07/2008  FINDINGS: Upper normal heart size.  Normal mediastinal contours and pulmonary vascularity.  Linear scarring RIGHT lower lobe.  No acute infiltrate, pleural effusion or pneumothorax.  Nonspecific calcification in RIGHT cervical region, cannot exclude carotid calcification.  BILATERAL cervical ribs.  Minimal levoconvex thoracic scoliosis.  IMPRESSION: RIGHT lower lobe scarring.  No acute infiltrate.  Question RIGHT carotid arterial calcification ; recommend followup nonemergent carotid ultrasound to exclude carotid vascular disease.   Electronically Signed   By: Lavonia Dana M.D.   On: 01/12/2014  15:56     EKG Interpretation   Date/Time:  Wednesday January 12 2014 14:40:17 EDT Ventricular Rate:  66 PR Interval:  109 QRS Duration: 84 QT Interval:  405 QTC Calculation: 424 R Axis:   112 Text Interpretation:  Right and left arm electrode reversal,  interpretation assumes no reversal Sinus rhythm Short PR interval Consider  right atrial enlargement Consider left ventricular hypertrophy Probable  lateral infarct, age indeterminate Anterior ST elevation, probably due to  LVH Similar to prior Confirmed by HORTON  MD, COURTNEY ([id number]) on  01/12/2014 2:45:56 PM      MDM   Final diagnoses:  Other chest pain   58 y/o female with PMH of HTN and lupus that presents with generalized weakness and chest pain with onset today. Patient has no history of CAD and reports that she is very hot in her home and has not been able to have adequate PO intake. No neuro deficits on exam and pain has reduced with nitro  Patient given IV fluids and did have improvement. Patient noted have a HEART score of 3 and EKG noted to have ST elevations in V1-V3 that are noted to be early-repole and not concerning for cardiac ischemia. Trop -neg, CXR unremarkable. Patient has stead gait and no neuro deficits. It was determined that after a delta negative trop the patient was appropriate for discharge and at discharge had good resolution of symptoms.     Claudean Severance, MD 01/13/14 1626

## 2014-01-14 NOTE — ED Provider Notes (Signed)
This patient was seen in conjunction with the resident physician, Dr. Fredderick Severance.  The documentation accurately reflects this patient's evaluation in the ED.  On my exam, patient was in no distress.  Patient mostly complains of weakness, and chest pain seems secondary to her.  Patient's complaints are chronic, and his evaluation is largely reassuring, with serial negative troponins, unremarkable labs. EKG was nonischemic, rate 66, LVH, abnormal  Carmin Muskrat, MD 01/14/14 [phone]

## 2014-10-10 IMAGING — CR DG CHEST 2V
3 series · 3 of 3 positions shown · non-contrast
Comparison: 06/07/2008

CLINICAL DATA: Pre syncope, dizziness, fatigue, shortness of
breath, cough and chest pain for few weeks, leg pain, history
hypertension, lupus, smoking

EXAM:
CHEST  2 VIEW

[w chest lat (1 of 2)]
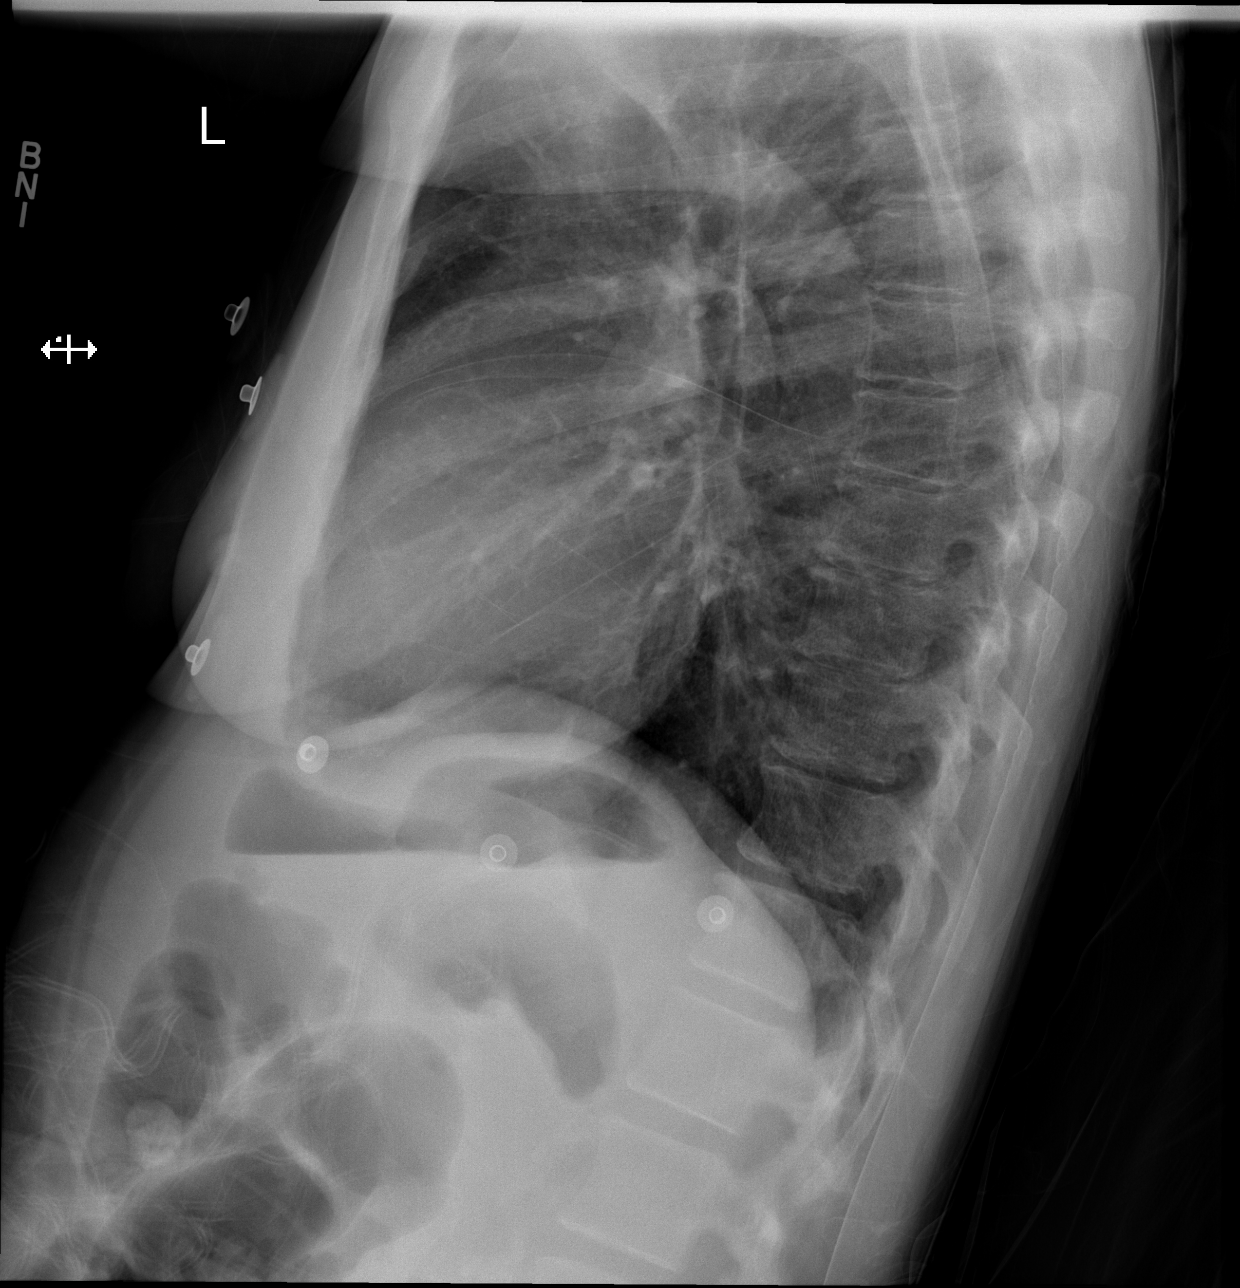

[w chest lat (2 of 2)]
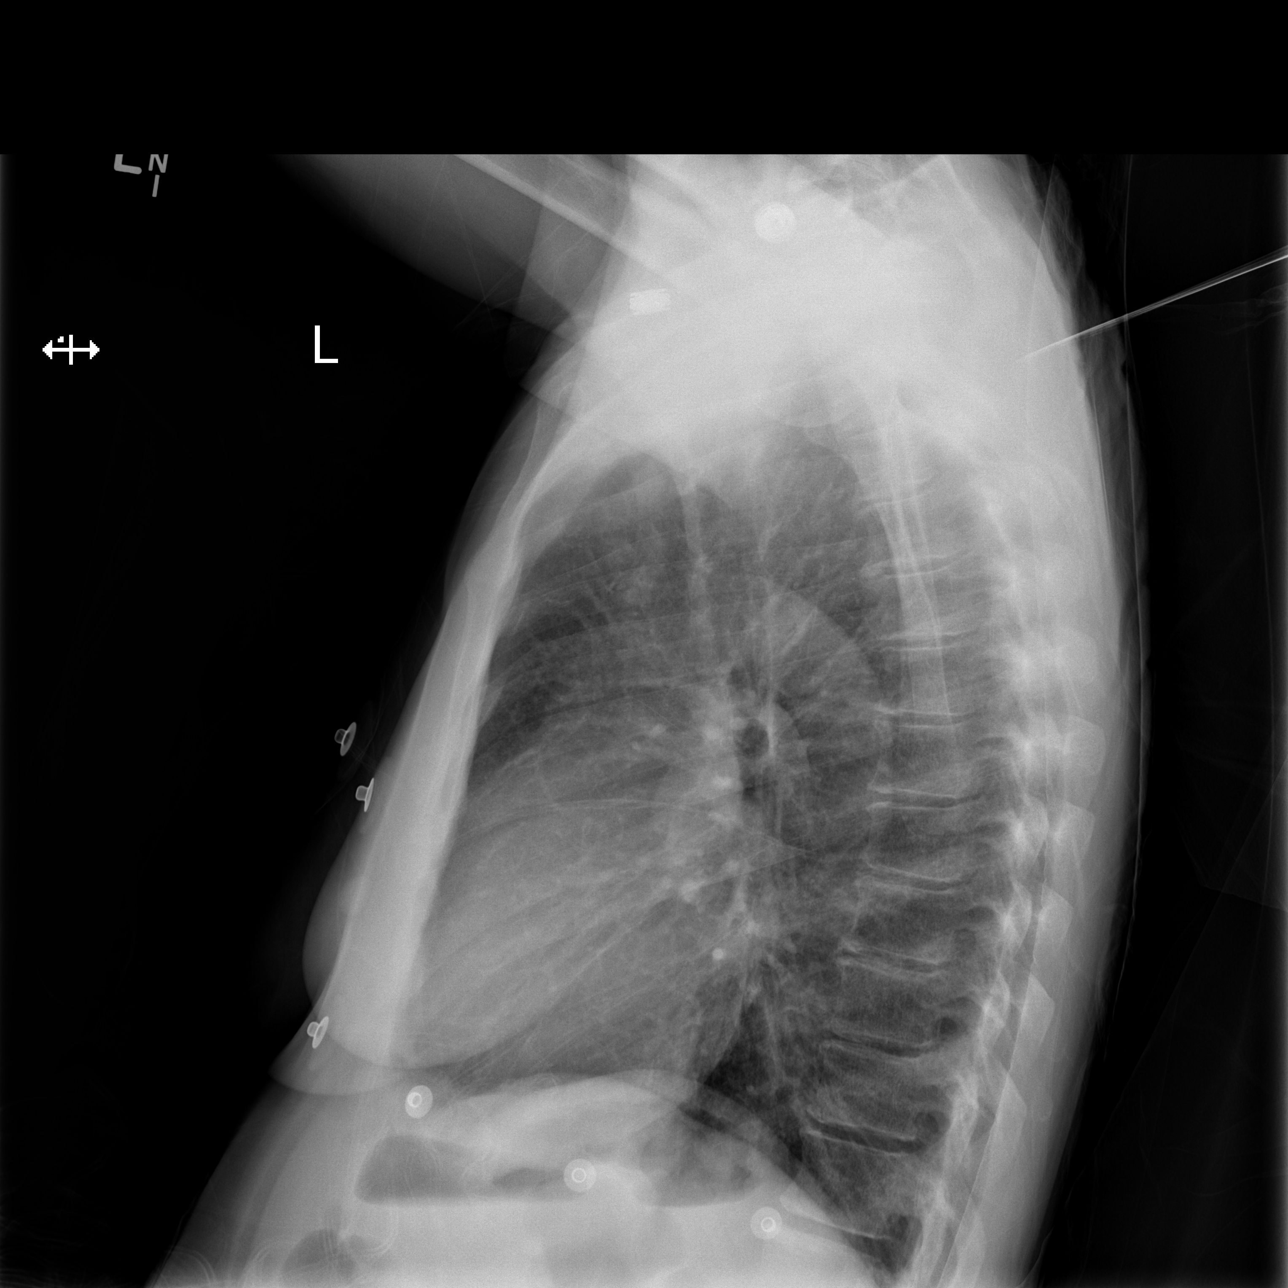

[x chest ap]
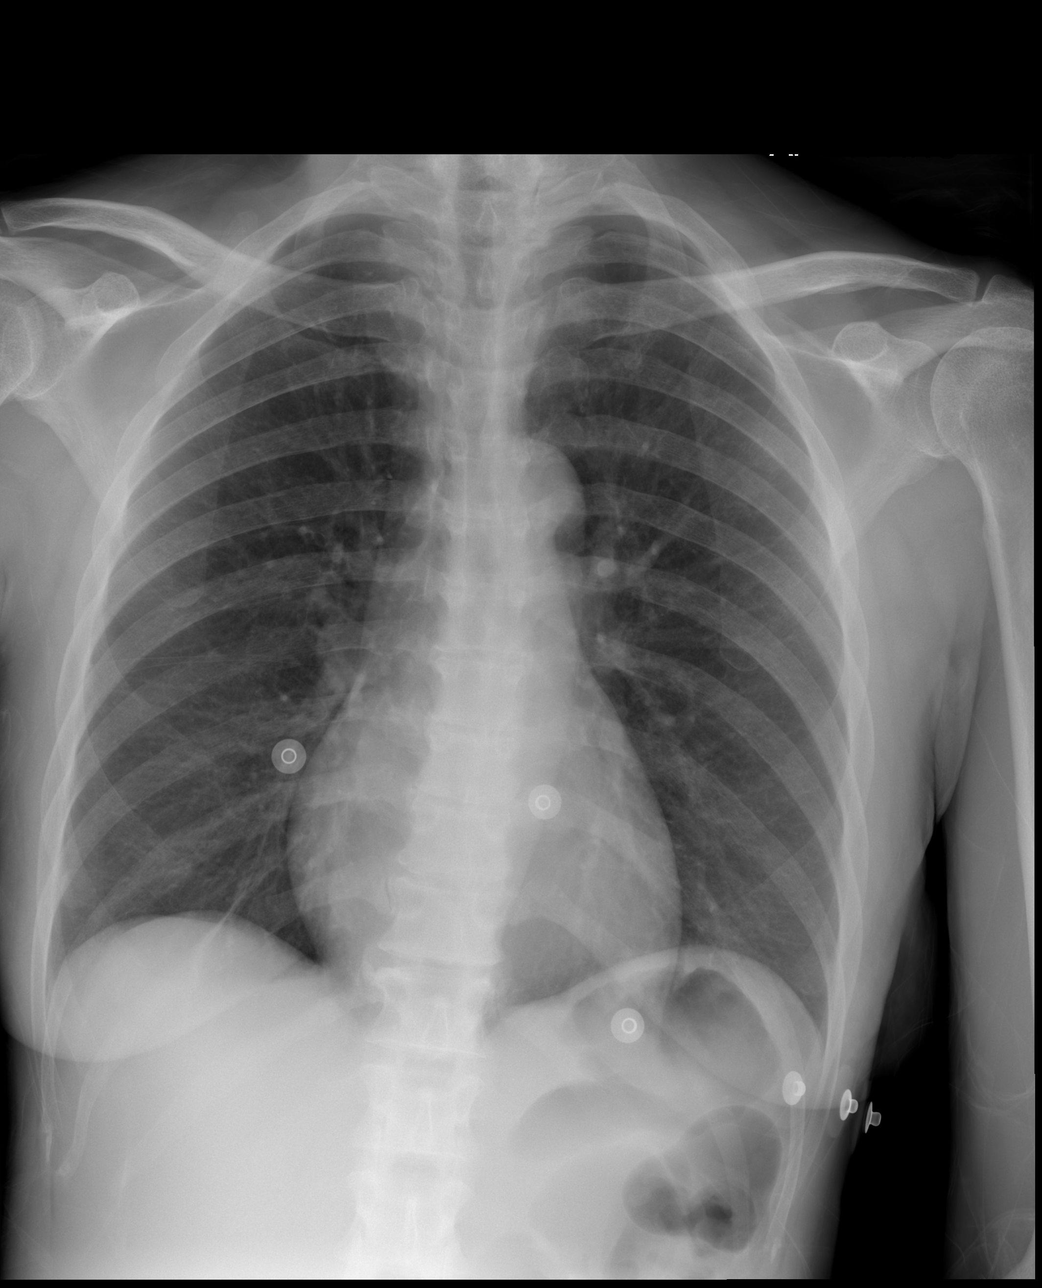

[3 of 3 positions shown; findings below may reference images not displayed]

FINDINGS: Upper normal heart size.

Normal mediastinal contours and pulmonary vascularity.

Linear scarring RIGHT lower lobe.

No acute infiltrate, pleural effusion or pneumothorax.

Nonspecific calcification in RIGHT cervical region, cannot exclude
carotid calcification.

BILATERAL cervical ribs.

Minimal levoconvex thoracic scoliosis.
IMPRESSION: RIGHT lower lobe scarring.

No acute infiltrate.

Question RIGHT carotid arterial calcification ; recommend followup
nonemergent carotid ultrasound to exclude carotid vascular disease.

## 2015-12-07 ENCOUNTER — Emergency Department (HOSPITAL_COMMUNITY): Payer: Medicaid Other

## 2015-12-07 ENCOUNTER — Emergency Department (HOSPITAL_COMMUNITY)
Admission: EM | Admit: 2015-12-07 | Discharge: 2015-12-07 | Disposition: A | Discharge status: Home / Self Care | Payer: Medicaid Other | Attending: Emergency Medicine | Admitting: Emergency Medicine

## 2015-12-07 ENCOUNTER — Ambulatory Visit (HOSPITAL_COMMUNITY)
Admission: RE | Admit: 2015-12-07 | Discharge: 2015-12-07 | Disposition: A | Discharge status: Home / Self Care | Payer: Medicaid Other | Source: Ambulatory Visit | Attending: Emergency Medicine | Admitting: Emergency Medicine

## 2015-12-07 ENCOUNTER — Encounter (HOSPITAL_COMMUNITY): Payer: Self-pay | Admitting: Emergency Medicine

## 2015-12-07 ENCOUNTER — Other Ambulatory Visit (HOSPITAL_COMMUNITY): Payer: Self-pay | Admitting: Emergency Medicine

## 2015-12-07 ENCOUNTER — Other Ambulatory Visit: Payer: Self-pay

## 2015-12-07 DIAGNOSIS — Z599 Problem related to housing and economic circumstances, unspecified: Secondary | ICD-10-CM

## 2015-12-07 DIAGNOSIS — I10 Essential (primary) hypertension: Secondary | ICD-10-CM | POA: Diagnosis not present

## 2015-12-07 DIAGNOSIS — R52 Pain, unspecified: Secondary | ICD-10-CM | POA: Diagnosis not present

## 2015-12-07 DIAGNOSIS — F419 Anxiety disorder, unspecified: Secondary | ICD-10-CM | POA: Insufficient documentation

## 2015-12-07 DIAGNOSIS — R079 Chest pain, unspecified: Secondary | ICD-10-CM | POA: Insufficient documentation

## 2015-12-07 DIAGNOSIS — F17210 Nicotine dependence, cigarettes, uncomplicated: Secondary | ICD-10-CM | POA: Insufficient documentation

## 2015-12-07 DIAGNOSIS — Z91040 Latex allergy status: Secondary | ICD-10-CM | POA: Insufficient documentation

## 2015-12-07 HISTORY — DX: Anxiety disorder, unspecified: F41.9

## 2015-12-07 LAB — COMPREHENSIVE METABOLIC PANEL
ALBUMIN: 4.1 g/dL (ref 3.5–5.0)
ALT: 17 U/L (ref 14–54)
AST: 24 U/L (ref 15–41)
Alkaline Phosphatase: 63 U/L (ref 38–126)
Anion gap: 11 (ref 5–15)
BUN: 32 mg/dL — ABNORMAL HIGH (ref 6–20)
CHLORIDE: 98 mmol/L — AB (ref 101–111)
CO2: 25 mmol/L (ref 22–32)
Calcium: 9.7 mg/dL (ref 8.9–10.3)
Creatinine, Ser: 2.56 mg/dL — ABNORMAL HIGH (ref 0.44–1.00)
GFR calc non Af Amer: 20 mL/min — ABNORMAL LOW (ref >60)
GFR, EST AFRICAN AMERICAN: 23 mL/min — AB (ref >60)
Glucose, Bld: 91 mg/dL (ref 65–99)
Potassium: 3.4 mmol/L — ABNORMAL LOW (ref 3.5–5.1)
SODIUM: 134 mmol/L — AB (ref 135–145)
Total Bilirubin: 0.4 mg/dL (ref 0.3–1.2)
Total Protein: 8.7 g/dL — ABNORMAL HIGH (ref 6.5–8.1)

## 2015-12-07 LAB — CBC WITH DIFFERENTIAL/PLATELET
BASOS PCT: 0 %
Basophils Absolute: 0 10*3/uL (ref 0.0–0.1)
EOS ABS: 0 10*3/uL (ref 0.0–0.7)
EOS PCT: 1 %
HCT: 41.6 % (ref 36.0–46.0)
Hemoglobin: 14.1 g/dL (ref 12.0–15.0)
LYMPHS ABS: 1.8 10*3/uL (ref 0.7–4.0)
Lymphocytes Relative: 37 %
MCH: 30.1 pg (ref 26.0–34.0)
MCHC: 33.9 g/dL (ref 30.0–36.0)
MCV: 88.7 fL (ref 78.0–100.0)
Monocytes Absolute: 0.5 10*3/uL (ref 0.1–1.0)
Monocytes Relative: 10 %
Neutro Abs: 2.6 10*3/uL (ref 1.7–7.7)
Neutrophils Relative %: 52 %
PLATELETS: 215 10*3/uL (ref 150–400)
RBC: 4.69 MIL/uL (ref 3.87–5.11)
RDW: 13 % (ref 11.5–15.5)
WBC: 5 10*3/uL (ref 4.0–10.5)

## 2015-12-07 LAB — I-STAT TROPONIN, ED: Troponin i, poc: 0 ng/mL (ref 0.00–0.08)

## 2015-12-07 MED ORDER — LORAZEPAM 1 MG PO TABS
1.0000 mg | ORAL_TABLET | Freq: Once | ORAL | Status: AC
  Administered 2015-12-07: 1 mg via ORAL
  Filled 2015-12-07: qty 1

## 2015-12-07 NOTE — ED Notes (Signed)
[facility] from [facility] with CP. Pt has hx of anxiety and her CP gets worse when she becomes anxious. Pt is in a bad living situation and that triggers her anxiety attacks. Pt reports pain to bilateral arms. Pt a/o x 4 on arrival. Pt has hx of noncompliance with HTN meds and anxiety meds.

## 2015-12-07 NOTE — Progress Notes (Signed)
CSW engaged with Patient at Patient's bedside. Patient reports concerns with her current housing. Patient states that she stays in a [facility] apartment and that she needs a note so that she can get into a new apartment. She states that she has spoken with the [employer] people who have given her resources and told her that she needs to take a few classes prior to getting a new place. She states that because of her social issues, this causes her to have anxiety and the anxiety causes occasional chest pains.CSW provided Patient with resources to the [facility] who assist with locating affordable rental housing for families and individuals in crisis and the chronically homeless. Patient reports that she is also actively using crack to deal with her stress but emphasizes that she is not drinking alcohol. CSW provided Patient with brief psychoeducation on the effects of substance abuse on the body and Patient's mental health. Patient reports an understanding and expresses a desire to get clean and get back active in her church. Patient reports that she will follow up with the [facility] to assist with her housing situation. Patient was provided two bus passes to get to and from [employer]. Patient was also provided with substance abuse resources. Patient appreciative of care received at this time. No further needs reported. CSW signing off. Please contact if new need(s) arise.     Emiliano Dyer, LCSW [facility] ED/87M Clinical Social Worker [phone]

## 2015-12-07 NOTE — Discharge Instructions (Signed)
Panic Attacks °Panic attacks are sudden, short feelings of great fear or discomfort. You may have them for no reason when you are relaxed, when you are uneasy (anxious), or when you are sleeping.  °HOME CARE °· Take all your medicines as told. °· Check with your doctor before starting new medicines. °· Keep all doctor visits. °GET HELP IF: °· You are not able to take your medicines as told. °· Your symptoms do not get better. °· Your symptoms get worse. °GET HELP RIGHT AWAY IF: °· Your attacks seem different than your normal attacks. °· You have thoughts about hurting yourself or others. °· You take panic attack medicine and you have a side effect. °MAKE SURE YOU: °· Understand these instructions. °· Will watch your condition. °· Will get help right away if you are not doing well or get worse. °  °This information is not intended to replace advice given to you by your health care provider. Make sure you discuss any questions you have with your health care provider. °  °Document Released: 07/06/2010 Document Revised: 03/24/2013 Document Reviewed: 01/15/2013 °Elsevier Interactive Patient Education ©2016 [employer] Inc[employer] ° °

## 2015-12-07 NOTE — ED Provider Notes (Signed)
CSN: [id number]     Arrival date & time 12/07/15  1222 History   First MD Initiated Contact with Patient 12/07/15 1504     Chief Complaint  Patient presents with  . Chest Pain     (Consider location/radiation/quality/duration/timing/severity/associated sxs/prior Treatment) HPI Comments: Patient presents to the ED with a chief complaint of "housing problem."  She states that she stays in a [place] apartment and that she needs a note so that she can get into a new apartment.  She states that she has spoken with the [employer] people who have given her resources and told her that she needs to take a few classes prior to getting a new place.  She states that because of her social issues, this causes her to have anxiety and the anxiety causes occasional chest pains.  She denies symptoms at present.  There are no other associated symptoms.  She has been noncompliant in taking her HTN and anxiety meds.  The history is provided by the patient. No language interpreter was used.    Past Medical History  Diagnosis Date  . Hypertension   . Lupus ([place])   . Anxiety    History reviewed. No pertinent past surgical history. History reviewed. No pertinent family history. Social History  Substance Use Topics  . Smoking status: Current Every Day Smoker -- 0.50 packs/day    Types: Cigarettes  . Smokeless tobacco: None  . Alcohol Use: No   OB History    No data available     Review of Systems  Constitutional: Negative for fever and chills.  Respiratory: Negative for shortness of breath.   Cardiovascular: Negative for chest pain.  Gastrointestinal: Negative for nausea, vomiting, diarrhea and constipation.  Genitourinary: Negative for dysuria.  All other systems reviewed and are negative.     Allergies  Aspirin and Latex  Home Medications   Prior to Admission medications   Not on File   BP 145/87 mmHg  Pulse 65  Temp(Src) 98.8 F (37.1 C) (Oral)  Resp 14  Ht 5\' 4"  (1.626 m)  Wt  40.824 kg  BMI 15.44 kg/m2  SpO2 91% Physical Exam  Constitutional: She is oriented to person, place, and time. She appears well-developed and well-nourished.  HENT:  Head: Normocephalic and atraumatic.  Eyes: Conjunctivae and EOM are normal. Pupils are equal, round, and reactive to light.  Neck: Normal range of motion. Neck supple.  Cardiovascular: Normal rate and regular rhythm.  Exam reveals no gallop and no friction rub.   No murmur heard. Pulmonary/Chest: Effort normal and breath sounds normal. No respiratory distress. She has no wheezes. She has no rales. She exhibits no tenderness.  Abdominal: Soft. Bowel sounds are normal. She exhibits no distension and no mass. There is no tenderness. There is no rebound and no guarding.  Musculoskeletal: Normal range of motion. She exhibits no edema or tenderness.  Neurological: She is alert and oriented to person, place, and time.  Skin: Skin is warm and dry.  Psychiatric: She has a normal mood and affect. Her behavior is normal. Judgment and thought content normal.  Nursing note and vitals reviewed.   ED Course  Procedures (including critical care time) Results for orders placed or performed during the hospital encounter of 12/07/15  CBC with Differential/Platelet  Result Value Ref Range   WBC 5.0 4.0 - 10.5 K/uL   RBC 4.69 3.87 - 5.11 MIL/uL   Hemoglobin 14.1 12.0 - 15.0 g/dL   HCT 41.6 36.0 - 46.0 %  MCV 88.7 78.0 - 100.0 fL   MCH 30.1 26.0 - 34.0 pg   MCHC 33.9 30.0 - 36.0 g/dL   RDW 13.0 11.5 - 15.5 %   Platelets 215 150 - 400 K/uL   Neutrophils Relative % 52 %   Neutro Abs 2.6 1.7 - 7.7 K/uL   Lymphocytes Relative 37 %   Lymphs Abs 1.8 0.7 - 4.0 K/uL   Monocytes Relative 10 %   Monocytes Absolute 0.5 0.1 - 1.0 K/uL   Eosinophils Relative 1 %   Eosinophils Absolute 0.0 0.0 - 0.7 K/uL   Basophils Relative 0 %   Basophils Absolute 0.0 0.0 - 0.1 K/uL  Comprehensive metabolic panel  Result Value Ref Range   Sodium 134 (L)  135 - 145 mmol/L   Potassium 3.4 (L) 3.5 - 5.1 mmol/L   Chloride 98 (L) 101 - 111 mmol/L   CO2 25 22 - 32 mmol/L   Glucose, Bld 91 65 - 99 mg/dL   BUN 32 (H) 6 - 20 mg/dL   Creatinine, Ser 2.56 (H) 0.44 - 1.00 mg/dL   Calcium 9.7 8.9 - 10.3 mg/dL   Total Protein 8.7 (H) 6.5 - 8.1 g/dL   Albumin 4.1 3.5 - 5.0 g/dL   AST 24 15 - 41 U/L   ALT 17 14 - 54 U/L   Alkaline Phosphatase 63 38 - 126 U/L   Total Bilirubin 0.4 0.3 - 1.2 mg/dL   GFR calc non Af Amer 20 (L) >60 mL/min   GFR calc Af Amer 23 (L) >60 mL/min   Anion gap 11 5 - 15  I-stat troponin, ED  Result Value Ref Range   Troponin i, poc 0.00 0.00 - 0.08 ng/mL   Comment 3           No results found.  I have personally reviewed and evaluated these images and lab results as part of my medical decision-making.   EKG Interpretation   Date/Time:  Thursday December 07 2015 12:33:35 EDT Ventricular Rate:  85 PR Interval:    QRS Duration: 79 QT Interval:  377 QTC Calculation: 449 R Axis:   82 Text Interpretation:  Sinus rhythm Short PR interval Right atrial  enlargement Consider left ventricular hypertrophy Anterior ST elevation,  probably due to LVH No significant change was found Confirmed by CAMPOS   MD, Lennette Bihari ([id number]) on 12/07/2015 3:19:38 PM      MDM   Final diagnoses:  Housing problems  Anxiety    Patient here with housing problem.  She states that this is the reason she came to the ED.  I have arranged for social work to speak with the patient and provide resources.  No symptoms at this time, but she does seem somewhat anxious.    Troponin is negative.  No ischemic changes on EKG.    Plan for discharge with follow-up/resources.  Cr is increased from prior about 2 years ago.  Recommend increasing fluids and f/u with primary care in 3 days.  Patient understands and agrees with this plan.  Labs and EKG reviewed with Dr. Venora Maples.   Montine Circle, PA-C 12/07/15 [place], MD 12/08/15 [phone]

## 2015-12-07 NOTE — ED Notes (Signed)
Pt ambulates independently and with steady gait at time of discharge. Discharge instructions and follow up information reviewed with patient. No other questions or concerns voiced at this time.  

## 2017-08-17 DIAGNOSIS — I10 Essential (primary) hypertension: Secondary | ICD-10-CM | POA: Insufficient documentation

## 2017-08-17 DIAGNOSIS — R519 Headache, unspecified: Secondary | ICD-10-CM | POA: Insufficient documentation

## 2017-08-17 DIAGNOSIS — F1910 Other psychoactive substance abuse, uncomplicated: Secondary | ICD-10-CM | POA: Insufficient documentation

## 2017-08-18 DIAGNOSIS — N184 Chronic kidney disease, stage 4 (severe): Secondary | ICD-10-CM | POA: Insufficient documentation

## 2017-08-18 DIAGNOSIS — N189 Chronic kidney disease, unspecified: Secondary | ICD-10-CM | POA: Insufficient documentation

## 2017-08-25 DIAGNOSIS — R768 Other specified abnormal immunological findings in serum: Secondary | ICD-10-CM | POA: Insufficient documentation

## 2018-02-24 ENCOUNTER — Encounter (HOSPITAL_COMMUNITY): Payer: Self-pay | Admitting: Emergency Medicine

## 2018-02-24 ENCOUNTER — Other Ambulatory Visit: Payer: Self-pay

## 2018-02-24 ENCOUNTER — Ambulatory Visit (INDEPENDENT_AMBULATORY_CARE_PROVIDER_SITE_OTHER): Payer: Medicaid Other

## 2018-02-24 ENCOUNTER — Ambulatory Visit (HOSPITAL_COMMUNITY)
Admission: EM | Admit: 2018-02-24 | Discharge: 2018-02-24 | Disposition: A | Discharge status: Home / Self Care | Payer: Medicaid Other | Attending: Family Medicine | Admitting: Family Medicine

## 2018-02-24 DIAGNOSIS — F1910 Other psychoactive substance abuse, uncomplicated: Secondary | ICD-10-CM

## 2018-02-24 DIAGNOSIS — J069 Acute upper respiratory infection, unspecified: Secondary | ICD-10-CM

## 2018-02-24 DIAGNOSIS — R05 Cough: Secondary | ICD-10-CM

## 2018-02-24 HISTORY — DX: Disorder of kidney and ureter, unspecified: N28.9

## 2018-02-24 MED ORDER — CETIRIZINE HCL 10 MG PO CHEW: 10.0000 mg | CHEWABLE_TABLET | Freq: Every day | ORAL | 0 refills | Status: DC

## 2018-02-24 MED ORDER — FLUTICASONE PROPIONATE 50 MCG/ACT NA SUSP: 2.0000 | Freq: Every day | NASAL | 0 refills | Status: DC

## 2018-02-24 NOTE — ED Triage Notes (Addendum)
Uri symptoms started 2 months ago.  Patient has chest congestion, head congestion.  Patient has multiple concerns.  Patient is homeless.  Patient comments about feel week and tired all the time

## 2018-02-24 NOTE — Discharge Instructions (Signed)
Chest x-ray did not show signs of pneumonia Get plenty of rest and push fluids Flonase prescribed use as directed Zyrtec prescribed.  Take daily for symptomatic relief Use OTC medication as needed for symptomatic relief Follow up with [facility] or with [place] next week for reevaluation of symptoms Please follow up with Education officer, museum for further assistance regarding substance abuse and current living situation Return or go to ER if you have any new or worsening symptoms

## 2018-02-24 NOTE — ED Provider Notes (Signed)
[facility]   [id number] 02/24/18 Arrival Time: 1093   CC: Chest congestion and head congestion  SUBJECTIVE: History from: patient.  Cassidy Larson is a 62 y.o. female hx congential heart murmur, HTN, anxiety, renal disorder, substance abuse, and homelessness who presents with worsening chest and head congestion for 2-3 days and dry cough for approximately 2 weeks.  Admits to positive sick exposure to friend with pneumonia.  Has NOT tried OTC medications.  Symptoms are made worse with laying down at night.  Reports previous symptoms in the past.   Complains of sweating, subjective fever, chills, fatigue, sinus pressure, rhinorrhea, dry cough, and decreased appetite.  Denies sore throat, SOB, wheezing, chest pain, nausea, changes in bowel or bladder habits.    Admits to smoking meth this morning.  Working with Education officer, museum to get into treatment center.     ROS: As per HPI.  Past Medical History:  Diagnosis Date  . Anxiety   . Hypertension   . Lupus ([place])   . Renal disorder    History reviewed. No pertinent surgical history. Allergies  Allergen Reactions  . Aspirin Other (See Comments)    Makes sick on stomach   . Latex Other (See Comments)    unspecified   No current facility-administered medications on file prior to encounter.    No current outpatient medications on file prior to encounter.   Social History   Socioeconomic History  . Marital status: Single    Spouse name: Not on file  . Number of children: Not on file  . Years of education: Not on file  . Highest education level: Not on file  Occupational History  . Not on file  Social Needs  . Financial resource strain: Not on file  . Food insecurity:    Worry: Not on file    Inability: Not on file  . Transportation needs:    Medical: Not on file    Non-medical: Not on file  Tobacco Use  . Smoking status: Current Every Day Smoker    Packs/day: 0.50    Types: Cigarettes  Substance and Sexual  Activity  . Alcohol use: No  . Drug use: Yes    Types: Cocaine    Comment: crack- last use 12/06/15  . Sexual activity: Yes    Birth control/protection: None  Lifestyle  . Physical activity:    Days per week: Not on file    Minutes per session: Not on file  . Stress: Not on file  Relationships  . Social connections:    Talks on phone: Not on file    Gets together: Not on file    Attends religious service: Not on file    Active member of club or organization: Not on file    Attends meetings of clubs or organizations: Not on file    Relationship status: Not on file  . Intimate partner violence:    Fear of current or ex partner: Not on file    Emotionally abused: Not on file    Physically abused: Not on file    Forced sexual activity: Not on file  Other Topics Concern  . Not on file  Social History Narrative  . Not on file   Family History  Family history unknown: Yes    OBJECTIVE:  Vitals:   02/24/18 1609  BP: (!) 181/102  Pulse: 71  Resp: 18  Temp: 98.2 F (36.8 C)  TempSrc: Oral  SpO2: 96%     General appearance: alert;  fragile appearing; appears chronically ill HEENT: Ears: EACs clear, TMs pearly gray with visible cone of light, without erythema; Eyes: pupils constricted, EOMI grossly; Sinuses nontender to palpation; Nose: clear rhinorrhea; Throat: oropharynx clear, tonsils nonerythematous without white tonsillar exudates, uvula midline dentition: poor, missing multiple teeth Neck: supple without LAD Lungs: unlabored respirations, symmetrical air entry; cough: moderate; no respiratory distress; harsh breath sounds heard throughout bilateral lung fields Heart: murmur present.  Skin: warm and dry Psychological: alert and cooperative; normal mood and affect  Imaging: Dg Chest 2 View  Result Date: 02/24/2018 CLINICAL DATA:  Cough for 2 weeks EXAM: CHEST - 2 VIEW COMPARISON:  August 17, 2017 FINDINGS: No edema or consolidation. The heart size and pulmonary  vascularity are normal. No adenopathy. No bone lesions. IMPRESSION: No edema or consolidation. Electronically Signed   By: Lowella Grip III M.D.   On: 02/24/2018 17:25   ASSESSMENT & PLAN:  1. URI with cough and congestion   2. Substance abuse ([place])    Meds ordered this encounter  Medications  . fluticasone (FLONASE) 50 MCG/ACT nasal spray    Sig: Place 2 sprays into both nostrils daily.    Dispense:  16 g    Refill:  0    Order Specific Question:   Supervising Provider    Answer:   Wynona Luna [phone]  . cetirizine (ZYRTEC) 10 MG chewable tablet    Sig: Chew 1 tablet (10 mg total) by mouth daily.    Dispense:  20 tablet    Refill:  0    Order Specific Question:   Supervising Provider    Answer:   Wynona Luna [[id number]]   Chest x-ray did not show signs of pneumonia Get plenty of rest and push fluids Flonase prescribed use as directed Zyrtec prescribed.  Take daily for symptomatic relief Use OTC medication as needed for symptomatic relief Follow up with [facility] or with [place] next week for reevaluation of symptoms Please follow up with Education officer, museum for further assistance regarding substance abuse and current living situation Return or go to ER if you have any new or worsening symptoms   Reviewed expectations re: course of current medical issues. Questions answered. Outlined signs and symptoms indicating need for more acute intervention. Patient verbalized understanding. After Visit Summary given.         Lestine Box, PA-C 02/24/18 1823

## 2018-04-19 ENCOUNTER — Emergency Department (HOSPITAL_COMMUNITY): Payer: Medicaid Other

## 2018-04-19 ENCOUNTER — Emergency Department (HOSPITAL_COMMUNITY)
Admission: EM | Admit: 2018-04-19 | Discharge: 2018-04-19 | Disposition: A | Discharge status: Home / Self Care | Payer: Medicaid Other | Attending: Emergency Medicine | Admitting: Emergency Medicine

## 2018-04-19 ENCOUNTER — Encounter (HOSPITAL_COMMUNITY): Payer: Self-pay | Admitting: Emergency Medicine

## 2018-04-19 DIAGNOSIS — W19XXXA Unspecified fall, initial encounter: Secondary | ICD-10-CM | POA: Diagnosis not present

## 2018-04-19 DIAGNOSIS — Z590 Homelessness: Secondary | ICD-10-CM | POA: Diagnosis not present

## 2018-04-19 DIAGNOSIS — Y999 Unspecified external cause status: Secondary | ICD-10-CM | POA: Insufficient documentation

## 2018-04-19 DIAGNOSIS — Y929 Unspecified place or not applicable: Secondary | ICD-10-CM | POA: Diagnosis not present

## 2018-04-19 DIAGNOSIS — R350 Frequency of micturition: Secondary | ICD-10-CM | POA: Diagnosis not present

## 2018-04-19 DIAGNOSIS — I10 Essential (primary) hypertension: Secondary | ICD-10-CM | POA: Insufficient documentation

## 2018-04-19 DIAGNOSIS — M79601 Pain in right arm: Secondary | ICD-10-CM | POA: Diagnosis not present

## 2018-04-19 DIAGNOSIS — Y939 Activity, unspecified: Secondary | ICD-10-CM | POA: Insufficient documentation

## 2018-04-19 DIAGNOSIS — M2550 Pain in unspecified joint: Secondary | ICD-10-CM | POA: Insufficient documentation

## 2018-04-19 DIAGNOSIS — F17210 Nicotine dependence, cigarettes, uncomplicated: Secondary | ICD-10-CM | POA: Diagnosis not present

## 2018-04-19 DIAGNOSIS — R52 Pain, unspecified: Secondary | ICD-10-CM

## 2018-04-19 DIAGNOSIS — Z79899 Other long term (current) drug therapy: Secondary | ICD-10-CM | POA: Diagnosis not present

## 2018-04-19 DIAGNOSIS — M7918 Myalgia, other site: Secondary | ICD-10-CM | POA: Diagnosis present

## 2018-04-19 LAB — CBC WITH DIFFERENTIAL/PLATELET
ABS IMMATURE GRANULOCYTES: 0.02 10*3/uL (ref 0.00–0.07)
BASOS PCT: 1 %
Basophils Absolute: 0 10*3/uL (ref 0.0–0.1)
EOS ABS: 0 10*3/uL (ref 0.0–0.5)
Eosinophils Relative: 1 %
HCT: 43.3 % (ref 36.0–46.0)
Hemoglobin: 13.7 g/dL (ref 12.0–15.0)
IMMATURE GRANULOCYTES: 0 %
Lymphocytes Relative: 20 %
Lymphs Abs: 1.3 10*3/uL (ref 0.7–4.0)
MCH: 30.5 pg (ref 26.0–34.0)
MCHC: 31.6 g/dL (ref 30.0–36.0)
MCV: 96.4 fL (ref 80.0–100.0)
Monocytes Absolute: 0.6 10*3/uL (ref 0.1–1.0)
Monocytes Relative: 10 %
NEUTROS ABS: 4.3 10*3/uL (ref 1.7–7.7)
NRBC: 0 % (ref 0.0–0.2)
Neutrophils Relative %: 68 %
PLATELETS: 266 10*3/uL (ref 150–400)
RBC: 4.49 MIL/uL (ref 3.87–5.11)
RDW: 13.2 % (ref 11.5–15.5)
WBC: 6.3 10*3/uL (ref 4.0–10.5)

## 2018-04-19 LAB — COMPREHENSIVE METABOLIC PANEL
ALBUMIN: 3.5 g/dL (ref 3.5–5.0)
ALT: 20 U/L (ref 0–44)
AST: 27 U/L (ref 15–41)
Alkaline Phosphatase: 54 U/L (ref 38–126)
Anion gap: 7 (ref 5–15)
BILIRUBIN TOTAL: 0.7 mg/dL (ref 0.3–1.2)
BUN: 24 mg/dL — ABNORMAL HIGH (ref 6–20)
CO2: 21 mmol/L — ABNORMAL LOW (ref 22–32)
Calcium: 9.2 mg/dL (ref 8.9–10.3)
Chloride: 109 mmol/L (ref 98–111)
Creatinine, Ser: 1.42 mg/dL — ABNORMAL HIGH (ref 0.44–1.00)
GFR calc Af Amer: 45 mL/min — ABNORMAL LOW (ref >60)
GFR, EST NON AFRICAN AMERICAN: 39 mL/min — AB (ref >60)
GLUCOSE: 85 mg/dL (ref 70–99)
POTASSIUM: 4.9 mmol/L (ref 3.5–5.1)
Sodium: 137 mmol/L (ref 135–145)
TOTAL PROTEIN: 8.3 g/dL — AB (ref 6.5–8.1)

## 2018-04-19 LAB — URINALYSIS, ROUTINE W REFLEX MICROSCOPIC
BILIRUBIN URINE: NEGATIVE
GLUCOSE, UA: NEGATIVE mg/dL
Hgb urine dipstick: NEGATIVE
KETONES UR: NEGATIVE mg/dL
LEUKOCYTES UA: NEGATIVE
Nitrite: NEGATIVE
PH: 5 (ref 5.0–8.0)
Protein, ur: 30 mg/dL — AB
SPECIFIC GRAVITY, URINE: 1.025 (ref 1.005–1.030)

## 2018-04-19 LAB — CK: Total CK: 116 U/L (ref 38–234)

## 2018-04-19 MED ORDER — ACETAMINOPHEN 500 MG PO TABS
500.0000 mg | ORAL_TABLET | Freq: Once | ORAL | Status: AC
  Administered 2018-04-19: 500 mg via ORAL
  Filled 2018-04-19: qty 1

## 2018-04-19 MED ORDER — SODIUM CHLORIDE 0.9 % IV BOLUS
500.0000 mL | Freq: Once | INTRAVENOUS | Status: AC
  Administered 2018-04-19: 500 mL via INTRAVENOUS

## 2018-04-19 NOTE — ED Notes (Signed)
Pt back from xray and IV fluids are done.

## 2018-04-19 NOTE — Discharge Instructions (Addendum)
There is no clear cause for why your arm is hurting.  If you develop fever, worsening pain, numbness, chest pain, shortness of breath, or any other new/concerning symptoms and return to the ER for evaluation. You may take tylenol for pain.

## 2018-04-19 NOTE — ED Notes (Signed)
D/c teaching done.  Pt expresses concerns of being homeless and needing housing and medications needs.  Mariann Laster, case manager notified.  Will come see pt.

## 2018-04-19 NOTE — ED Notes (Signed)
Pt to xray

## 2018-04-19 NOTE — ED Notes (Signed)
Pt ambulated to restroom without difficulty
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
## 2018-04-19 NOTE — ED Triage Notes (Addendum)
Pt reports generalized pain all over due to "multiple falls" states that she is homeless and her legs have been giving out lately causing her to fall. Also reports urinary frequency for a while

## 2018-04-19 NOTE — ED Notes (Signed)
Back from xray

## 2018-05-21 ENCOUNTER — Ambulatory Visit (INDEPENDENT_AMBULATORY_CARE_PROVIDER_SITE_OTHER): Payer: Medicaid Other

## 2018-05-21 ENCOUNTER — Other Ambulatory Visit: Payer: Self-pay

## 2018-05-21 ENCOUNTER — Encounter (HOSPITAL_COMMUNITY): Payer: Self-pay

## 2018-05-21 ENCOUNTER — Ambulatory Visit (HOSPITAL_COMMUNITY)
Admission: EM | Admit: 2018-05-21 | Discharge: 2018-05-21 | Disposition: A | Discharge status: Home / Self Care | Payer: Medicaid Other | Attending: Family Medicine | Admitting: Family Medicine

## 2018-05-21 DIAGNOSIS — W19XXXA Unspecified fall, initial encounter: Secondary | ICD-10-CM

## 2018-05-21 DIAGNOSIS — M25532 Pain in left wrist: Secondary | ICD-10-CM

## 2018-05-21 MED ORDER — HYDROCODONE-ACETAMINOPHEN 5-325 MG PO TABS
1.0000 | ORAL_TABLET | Freq: Once | ORAL | Status: AC
  Administered 2018-05-21: 1 via ORAL

## 2018-05-21 MED ORDER — HYDROCODONE-ACETAMINOPHEN 5-325 MG PO TABS
ORAL_TABLET | ORAL | Status: AC
  Filled 2018-05-21: qty 1

## 2018-05-21 NOTE — ED Triage Notes (Signed)
Pt states she fell last night. Pt thinks she broke her left wrist and left forearm.
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
## 2018-05-21 NOTE — Discharge Instructions (Signed)
You need to follow-up with your primary care doctor for ongoing pain management

## 2018-08-09 ENCOUNTER — Emergency Department (HOSPITAL_COMMUNITY)
Admission: EM | Admit: 2018-08-09 | Discharge: 2018-08-09 | Disposition: A | Discharge status: Home / Self Care | Payer: Medicaid Other | Attending: Emergency Medicine | Admitting: Emergency Medicine

## 2018-08-09 ENCOUNTER — Other Ambulatory Visit: Payer: Self-pay

## 2018-08-09 ENCOUNTER — Encounter (HOSPITAL_COMMUNITY): Payer: Self-pay | Admitting: Emergency Medicine

## 2018-08-09 DIAGNOSIS — I10 Essential (primary) hypertension: Secondary | ICD-10-CM | POA: Insufficient documentation

## 2018-08-09 DIAGNOSIS — F1490 Cocaine use, unspecified, uncomplicated: Secondary | ICD-10-CM | POA: Diagnosis not present

## 2018-08-09 DIAGNOSIS — R531 Weakness: Secondary | ICD-10-CM | POA: Insufficient documentation

## 2018-08-09 DIAGNOSIS — E860 Dehydration: Secondary | ICD-10-CM

## 2018-08-09 DIAGNOSIS — Z91040 Latex allergy status: Secondary | ICD-10-CM | POA: Insufficient documentation

## 2018-08-09 DIAGNOSIS — Z590 Homelessness: Secondary | ICD-10-CM | POA: Diagnosis not present

## 2018-08-09 DIAGNOSIS — F17210 Nicotine dependence, cigarettes, uncomplicated: Secondary | ICD-10-CM | POA: Diagnosis not present

## 2018-08-09 DIAGNOSIS — F1290 Cannabis use, unspecified, uncomplicated: Secondary | ICD-10-CM | POA: Insufficient documentation

## 2018-08-09 DIAGNOSIS — R079 Chest pain, unspecified: Secondary | ICD-10-CM | POA: Diagnosis present

## 2018-08-09 LAB — RAPID URINE DRUG SCREEN, HOSP PERFORMED
AMPHETAMINES: NOT DETECTED
Barbiturates: NOT DETECTED
Benzodiazepines: NOT DETECTED
Cocaine: POSITIVE — AB
Opiates: NOT DETECTED
Tetrahydrocannabinol: POSITIVE — AB

## 2018-08-09 LAB — COMPREHENSIVE METABOLIC PANEL
ALBUMIN: 3.7 g/dL (ref 3.5–5.0)
ALT: 20 U/L (ref 0–44)
ANION GAP: 10 (ref 5–15)
AST: 25 U/L (ref 15–41)
Alkaline Phosphatase: 58 U/L (ref 38–126)
BUN: 26 mg/dL — ABNORMAL HIGH (ref 6–20)
CO2: 20 mmol/L — AB (ref 22–32)
Calcium: 9.6 mg/dL (ref 8.9–10.3)
Chloride: 108 mmol/L (ref 98–111)
Creatinine, Ser: 1.48 mg/dL — ABNORMAL HIGH (ref 0.44–1.00)
GFR calc Af Amer: 44 mL/min — ABNORMAL LOW (ref >60)
GFR calc non Af Amer: 38 mL/min — ABNORMAL LOW (ref >60)
GLUCOSE: 109 mg/dL — AB (ref 70–99)
Potassium: 4.3 mmol/L (ref 3.5–5.1)
Sodium: 138 mmol/L (ref 135–145)
Total Bilirubin: 0.5 mg/dL (ref 0.3–1.2)
Total Protein: 9.4 g/dL — ABNORMAL HIGH (ref 6.5–8.1)

## 2018-08-09 LAB — URINALYSIS, ROUTINE W REFLEX MICROSCOPIC
Bacteria, UA: NONE SEEN
Bilirubin Urine: NEGATIVE
Glucose, UA: NEGATIVE mg/dL
Hgb urine dipstick: NEGATIVE
Ketones, ur: NEGATIVE mg/dL
Nitrite: NEGATIVE
Protein, ur: 100 mg/dL — AB
Specific Gravity, Urine: 1.027 (ref 1.005–1.030)
pH: 5 (ref 5.0–8.0)

## 2018-08-09 LAB — CBC
HCT: 45.2 % (ref 36.0–46.0)
Hemoglobin: 14.5 g/dL (ref 12.0–15.0)
MCH: 29.5 pg (ref 26.0–34.0)
MCHC: 32.1 g/dL (ref 30.0–36.0)
MCV: 92.1 fL (ref 80.0–100.0)
Platelets: 279 10*3/uL (ref 150–400)
RBC: 4.91 MIL/uL (ref 3.87–5.11)
RDW: 14.3 % (ref 11.5–15.5)
WBC: 6 10*3/uL (ref 4.0–10.5)
nRBC: 0 % (ref 0.0–0.2)

## 2018-08-09 MED ORDER — SODIUM CHLORIDE 0.9 % IV BOLUS
1000.0000 mL | Freq: Once | INTRAVENOUS | Status: AC
  Administered 2018-08-09: 1000 mL via INTRAVENOUS

## 2018-08-09 NOTE — ED Notes (Signed)
Discharge instructions again discussed requesting bp medication perscritption. Will inform provider.

## 2018-08-09 NOTE — Progress Notes (Signed)
CSW met with patient to discuss homelessness resources and supports. CSW noted patient was not receptive and kept stating they would not help her.CSW processed with patient her concerns regarding the shelters and stated she has been to "them all" and would not help her. CSW discussed patient's history of homelessness related to her partner passing away and "leaving me nothing." CSW noted patient wanted to find her own home as she reported having SSDI. CSW discussed steps towards it and noted patient endorsed hopelessness. CSW discussed resources again and provided patient with resource list and contact information for [facility] and their crisis line. CSW discussed the housing resources that may be available and their connection to local boarding houses. CSW provided patient with a pair of jeans and provided the covering RN two bus passes to assist patient as she reports residing in an abandoned house. CSW signing off at this time please re-consult for future social work needs.  Lamonte Richer, LCSW, [place] Worker II [phone]

## 2018-08-09 NOTE — ED Notes (Signed)
Pt drinking coffee and eating [place] sandwhich. Discharge instructions discussed. Pt offered shelter information and state she doe not want to be there.

## 2018-08-09 NOTE — ED Triage Notes (Signed)
Pt in w/central cp that began this am after 2 episodes of diarrhea. Denies sob, n/v or coughing, is homeless. Did not take ASA PTA. EKG unremarkable per EMS

## 2018-08-09 NOTE — ED Notes (Signed)
SW at bedside.
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
## 2018-08-09 NOTE — ED Notes (Signed)
Social work aware of pt needs.

## 2018-12-10 NOTE — Progress Notes (Signed)
[place] Screening performed. Temperature, PHQ-9, and need for medical care and medications assessed. Referred to [facility].  Jobe Igo MSN, RN

## 2018-12-24 ENCOUNTER — Other Ambulatory Visit: Payer: Self-pay | Admitting: *Deleted

## 2018-12-24 DIAGNOSIS — Z20822 Contact with and (suspected) exposure to covid-19: Secondary | ICD-10-CM

## 2018-12-31 LAB — NOVEL CORONAVIRUS, NAA: SARS-CoV-2, NAA: NOT DETECTED

## 2019-01-06 NOTE — Progress Notes (Signed)
COVID-19 Screening performed. Temperature, PHQ-9, and need for medical care and medications assessed. No additional needs assessed at this time.  Kelton Bultman MSN, RN 

## 2019-01-26 ENCOUNTER — Telehealth: Payer: Self-pay

## 2019-01-26 NOTE — Telephone Encounter (Signed)
Message received from Jobe Igo, RN requesting an appointment for patient to establish care. Informed her that an appointment has been scheduled for 02/09/2019 @ 1030 @ [facility]

## 2019-02-04 NOTE — Progress Notes (Signed)
COVID-19 Screening performed. Temperature, PHQ-9, and need for medical care and medications assessed. No additional needs assessed at this time.  Valleri Hendricksen MSN, RN 

## 2019-02-05 ENCOUNTER — Telehealth: Payer: Self-pay

## 2019-02-05 ENCOUNTER — Encounter: Payer: Self-pay | Admitting: Internal Medicine

## 2019-02-05 NOTE — Telephone Encounter (Signed)
Spoke with Cassidy Larson about appt on August 25th at [facility]. She stated she would be moving and is unsure of next location. I will follow up with Derick of the [facility] for more details.   Jobe Igo RN, CNP

## 2019-02-07 NOTE — Progress Notes (Signed)
COVID-19 Screening performed. Temperature, PHQ-9, and need for medical care and medications assessed. No additional needs assessed at this time.  Remington Highbaugh MSN, RN 

## 2019-02-08 ENCOUNTER — Telehealth: Payer: Self-pay

## 2019-02-08 NOTE — Telephone Encounter (Signed)
Called patient to do their pre-visit COVID screening.  Unable to contact patient to do prescreening.

## 2019-02-09 ENCOUNTER — Ambulatory Visit: Payer: Medicaid Other | Admitting: Internal Medicine

## 2019-02-09 ENCOUNTER — Other Ambulatory Visit: Payer: Self-pay

## 2019-02-09 ENCOUNTER — Encounter (INDEPENDENT_AMBULATORY_CARE_PROVIDER_SITE_OTHER): Payer: Self-pay | Admitting: Primary Care

## 2019-02-09 ENCOUNTER — Other Ambulatory Visit (INDEPENDENT_AMBULATORY_CARE_PROVIDER_SITE_OTHER): Payer: Self-pay | Admitting: Primary Care

## 2019-02-09 ENCOUNTER — Ambulatory Visit (INDEPENDENT_AMBULATORY_CARE_PROVIDER_SITE_OTHER): Payer: Medicaid Other | Admitting: Primary Care

## 2019-02-09 ENCOUNTER — Telehealth: Payer: Self-pay

## 2019-02-09 VITALS — BP 163/88 | HR 65 | Temp 97.8°F | Ht 62.0 in | Wt 96.6 lb

## 2019-02-09 DIAGNOSIS — F1920 Other psychoactive substance dependence, uncomplicated: Secondary | ICD-10-CM

## 2019-02-09 DIAGNOSIS — Z681 Body mass index (BMI) 19 or less, adult: Secondary | ICD-10-CM | POA: Diagnosis not present

## 2019-02-09 DIAGNOSIS — F17200 Nicotine dependence, unspecified, uncomplicated: Secondary | ICD-10-CM

## 2019-02-09 DIAGNOSIS — I10 Essential (primary) hypertension: Secondary | ICD-10-CM

## 2019-02-09 DIAGNOSIS — F17210 Nicotine dependence, cigarettes, uncomplicated: Secondary | ICD-10-CM | POA: Diagnosis not present

## 2019-02-09 DIAGNOSIS — Z7689 Persons encountering health services in other specified circumstances: Secondary | ICD-10-CM
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
## 2019-02-09 NOTE — Progress Notes (Signed)
New Patient Office Visit  Subjective:  Patient ID: Cassidy Larson, female    DOB: 1955-08-03  Age: 63 y.o. MRN: [id number]  CC:  Chief Complaint  Patient presents with  . New Patient (Initial Visit)    pain all over     HPI [name] presents for establishment of care and used cocaine 2 days ago she is in excruciating pain she need pain medication because she is unable to care for her self its affecting her life she rather be dead than in this type of pain.   Past Medical History:  Diagnosis Date  . Anxiety   . CKD (chronic kidney disease) stage 4, GFR 15-29 ml/min (HCC)   . Cocaine abuse ([place])   . Essential hypertension   . Lupus ([place])   . Polysubstance abuse ([place])   . Positive ANA (antinuclear antibody)     History reviewed. No pertinent surgical history.  Family History  Family history unknown: Yes    Social History   Socioeconomic History  . Marital status: Single    Spouse name: Not on file  . Number of children: Not on file  . Years of education: Not on file  . Highest education level: Not on file  Occupational History  . Not on file  Social Needs  . Financial resource strain: Not on file  . Food insecurity    Worry: Not on file    Inability: Not on file  . Transportation needs    Medical: Not on file    Non-medical: Not on file  Tobacco Use  . Smoking status: Current Every Day Smoker    Packs/day: 0.50    Types: Cigarettes  . Smokeless tobacco: Never Used  Substance and Sexual Activity  . Alcohol use: No  . Drug use: Yes    Types: Marijuana, "Crack" cocaine  . Sexual activity: Yes    Birth control/protection: None  Lifestyle  . Physical activity    Days per week: Not on file    Minutes per session: Not on file  . Stress: Not on file  Relationships  . Social Herbalist on phone: Not on file    Gets together: Not on file    Attends religious service: Not on file    Active member of club or organization: Not on file   Attends meetings of clubs or organizations: Not on file    Relationship status: Not on file  . Intimate partner violence    Fear of current or ex partner: Not on file    Emotionally abused: Not on file    Physically abused: Not on file    Forced sexual activity: Not on file  Other Topics Concern  . Not on file  Social History Narrative  . Not on file    ROS Review of Systems  Constitutional: Positive for fatigue.  Musculoskeletal: Positive for arthralgias.  Neurological: Positive for light-headedness.    Objective:   Today's Vitals: BP (!) 163/88 (BP Location: Left Arm, Patient Position: Sitting, Cuff Size: Normal)   Pulse 65   Temp 97.8 F (36.6 C) (Tympanic)   Ht 5\' 2"  (1.575 m)   Wt 96 lb 9.6 oz (43.8 kg)   SpO2 96%   BMI 17.67 kg/m   Physical Exam Constitutional:      Appearance: Normal appearance.     Comments: Thin built   HENT:     Head: Normocephalic.     Right Ear: Tympanic membrane normal.  Left Ear: Tympanic membrane normal.  Eyes:     Extraocular Movements: Extraocular movements intact.     Pupils: Pupils are equal, round, and reactive to light.  Neck:     Musculoskeletal: Normal range of motion.  Cardiovascular:     Rate and Rhythm: Normal rate and regular rhythm.  Pulmonary:     Effort: Pulmonary effort is normal.     Breath sounds: Normal breath sounds.  Abdominal:     General: Abdomen is flat. Bowel sounds are normal.     Palpations: Abdomen is soft.  Musculoskeletal: Normal range of motion.  Skin:    General: Skin is warm and dry.  Neurological:     General: No focal deficit present.     Mental Status: She is alert and oriented to person, place, and time.     Assessment & Plan:  Emyla was seen today for new patient (initial visit).  Diagnoses and all orders for this visit:  Encounter to establish care [name] was dropped off by a nurse for an appointment on [place] patient was seen. I asked her when was the last time she  use cocaine 2 days ago. I explained I was unable to provide what she was requesting pain medications. She became irate voicing to kill herself because she is in pain and rather be dead.  Tobacco dependence Nicotine affect every organ in the body second leading cause of death.  Increased risk for lung cancer and other respiratory diseases recommend cessation.  This will be reminded at each clinical visit.  BMI less than 19,adult Malnutrition indicating an lack of food resource underlining conditions.   Essential hypertension Counseled on blood pressure goal of less than 130/80, low-sodium, DASH diet, medication compliance, 150 minutes of moderate intensity exercise per week. Discussed medication compliance, adverse effects.  Drug abuse and dependence Fish farm manager Aid [place]:  [phone]  /  [phone]  [place]:  [phone]  Family Service of the [facility] 24-hr Crisis line:  [phone]  [facility], [place]:  [phone]  [place] (custody):  [phone]  [facility]:   [phone]    Baby & Breastfeeding Car Seat Inspection @ [employer].- call [place] Lactation  [phone]  [place] Lactation [phone]  [place]: [phone] ([place]);  [phone] ([place])  [place] League:  [phone]   [place] Child Development: [phone] [facility]) / [phone] (HP)  - Child Care Resources/ Referrals/ Scholarships  - Head Start/ Early Head Start (call or apply online)  [place] DHHS: [place] Pre-K :  [phone] / [phone]   Employment / [place]: [phone] / [place]  [place] Works [place] ([place]): [phone] ([place]) / [phone] ([place])  [place]: [phone] / [id number]  [place] [employer] Job & Career Center: [phone]  DHHS Work First: [phone] ([place]) /  [phone] ([place])  [place]:  [place]:  [phone]  [employer]: [place] (furniture):  [place] Helping Hands: [phone]  [place]  [place]- SNAP/ Food Stamps: [phone]  [place]: Letta [phone] ;  HP [phone]  [place]  During the summer, text "FOOD" to [place] / Clinics (Adults) [place] (for Adults) through [employer]: [phone]  [place]  Family Medicine:   [place]:   [phone]  Health Department:  [place]:  [phone] / [phone]  Planned [facility] [place]:   [phone]  [facility]:   [phone] x [place]:   [place]:  [place]:  [phone]   [place] for [facility] [place]):  [phone]  Faith Action International House:  [place]:  [place]:  [place]:  [place]  www.youthsafegso.org  PFLAG  786-469-4399 / info@[place]a Glassman Project:  (951) 136-6952   Mental Health/ Substance Use Family Service of the [place]  [place]:  ([phone] or [phone]  [facility] of Care:  [phone]  Journeys Counseling:  [place]:  [phone]  [place] Sessions (walk-ins)  [phone] / [place]:  [phone]-123456789  Alcoholics Anonymous:  123XX123  Narcotics Anonymous:  339-532-2006  Quit Smoking Hotline:  800-QUIT-NOW 971-782-7967)   Parenting [place]:   [place]:  [phone]  YWCA: [phone]  [employer]: Bringing Out the Best:  [phone]               Thriving at Three (Hispanic families): [phone]  Healthy Start ([place]):  [phone] [phone]  Parents as Teachers:  [place] Together (Immigrants): (726)310-2516   Poison Control [phone])[phone]  [place] Open Doors Application: ImDemand.es  [place] of [place]: http://www.[place].gov/index.aspx?page=3615   Special Needs Family Support Network:  7818654510  [place] of [place]:   [place] or ([phone] /  [place]:  [phone]  [place]:  [place] (CDSA):  [phone]  [facility] (Care Coordination for Children):  3340507152   Transportation Medicaid Transportation: ([phone] 690-6931 to apply  [place]: [phone] (reduced-fare bus ID to [place])  SCAT Paratransit services: Eligible riders only, call 123456 for application   Tutoring/Mentoring [place]: [place]: [phone] Letta Kocher)  [phone] ([place])  ACES through child's school: [place]: contact your local [place]: [phone]     Follow-up: No follow-ups on file.   Kerin Perna, NP

## 2019-02-09 NOTE — Progress Notes (Signed)
COVID-19 Screening performed. Temperature, PHQ-9, and need for medical care and medications assessed. Pt referred for [facility] and medication assistance.  Jobe Igo MSN, RN

## 2019-02-10 ENCOUNTER — Other Ambulatory Visit: Payer: Self-pay

## 2019-02-10 ENCOUNTER — Encounter (HOSPITAL_COMMUNITY): Payer: Self-pay

## 2019-02-10 ENCOUNTER — Emergency Department (HOSPITAL_COMMUNITY)
Admission: EM | Admit: 2019-02-10 | Discharge: 2019-02-10 | Disposition: A | Discharge status: Home / Self Care | Payer: Medicaid Other | Attending: Emergency Medicine | Admitting: Emergency Medicine

## 2019-02-10 DIAGNOSIS — Z91040 Latex allergy status: Secondary | ICD-10-CM | POA: Diagnosis not present

## 2019-02-10 DIAGNOSIS — M62830 Muscle spasm of back: Secondary | ICD-10-CM | POA: Diagnosis not present

## 2019-02-10 DIAGNOSIS — F17210 Nicotine dependence, cigarettes, uncomplicated: Secondary | ICD-10-CM | POA: Insufficient documentation

## 2019-02-10 DIAGNOSIS — N184 Chronic kidney disease, stage 4 (severe): Secondary | ICD-10-CM | POA: Diagnosis not present

## 2019-02-10 DIAGNOSIS — I129 Hypertensive chronic kidney disease with stage 1 through stage 4 chronic kidney disease, or unspecified chronic kidney disease: Secondary | ICD-10-CM | POA: Diagnosis not present

## 2019-02-10 DIAGNOSIS — M791 Myalgia, unspecified site: Secondary | ICD-10-CM | POA: Diagnosis present

## 2019-02-10 MED ORDER — METHOCARBAMOL 500 MG PO TABS: 500.0000 mg | ORAL_TABLET | Freq: Two times a day (BID) | ORAL | 0 refills | Status: DC

## 2019-02-10 NOTE — ED Notes (Signed)
Resting in chair with eyes closed no respiratory or acute distress noted call light in reach.

## 2019-02-10 NOTE — Progress Notes (Addendum)
Consult request has been received. CSW attempting to follow up at present time.  8:23 PM Pt verbalized wanting a different shelter as she is "not treated right" at the [facility].   CSW actively validated the pt's opinion and provided feedback and informed the pt that calls had been made and there are no open beds at nearby shelters.  CSW attempted to offer [facility] as a solution and pt verbally reprimanded the CSW because per the pt. "They're just a prison, too", and "ya'll are all the same".  CSW attempted to desculate without success, left and returned and pt stated, "I just don't care" and when CSW again offered to assist with solutions pt verbally accosted the CSW in a loud voice for, "being just like the rest of them".  The above repeated itself several time until pt finally stated, "I will just go back" and began muttering under her breath.  CSW stated he was standing by should pt want to talk more and pt ignored the CSW.  EDP updated.  Please reconsult if future social work needs arise.  CSW signing off, as social work intervention is no longer needed.  Alphonse Guild. Zakaria Fromer, LCSW, LCAS, CSI Transitions of Care Clinical Social Worker Care Coordination Department Ph: [phone]

## 2019-02-10 NOTE — ED Provider Notes (Signed)
[facility] DEPT Provider Note   CSN: [id number] Arrival date & time: 02/10/19  1341     History   Chief Complaint Chief Complaint  Patient presents with  . Anxiety  . Generalized Body Aches    HPI Cassidy Larson is a 63 y.o. female with a past medical history of CKD, cocaine abuse, hypertension, polysubstance abuse presents to ED for generalized body spasms.  Patient states that she has been suffering from these symptoms for the past several years.  She has been taking over-the-counter medication such as Tylenol with no improvement.  Patient mostly speaking about being lost, away from her family, not getting Medicaid, providers making fun of her at her doctor's office appointment yesterday, feeling like she is labeled as a drug addict.  She denies any chest pain, shortness of breath, vomiting, SI, HI, AVH.     HPI  Past Medical History:  Diagnosis Date  . Anxiety   . CKD (chronic kidney disease) stage 4, GFR 15-29 ml/min (HCC)   . Cocaine abuse ([place])   . Essential hypertension   . Lupus ([place])   . Polysubstance abuse ([place])   . Positive ANA (antinuclear antibody)     There are no active problems to display for this patient.   History reviewed. No pertinent surgical history.   OB History   No obstetric history on file.      Home Medications    Prior to Admission medications   Medication Sig Start Date End Date Taking? Authorizing Provider  methocarbamol (ROBAXIN) 500 MG tablet Take 1 tablet (500 mg total) by mouth 2 (two) times daily. 02/10/19   Delia Heady, PA-C    Family History Family History  Family history unknown: Yes    Social History Social History   Tobacco Use  . Smoking status: Current Every Day Smoker    Packs/day: 0.50    Types: Cigarettes  . Smokeless tobacco: Never Used  Substance Use Topics  . Alcohol use: No  . Drug use: Yes    Types: Marijuana, "Crack" cocaine     Allergies   Aspirin and Latex   Review of Systems Review of Systems  Constitutional: Negative for chills and fever.  Respiratory: Negative for shortness of breath.   Gastrointestinal: Negative for vomiting.  Neurological: Negative for weakness and headaches.     Physical Exam Updated Vital Signs BP (!) 173/108 (BP Location: Right Arm)   Pulse 88   Temp 98.9 F (37.2 C) (Oral)   Resp 20   Ht 5\' 2"  (1.575 m)   Wt 43.5 kg   SpO2 95%   BMI 17.56 kg/m   Physical Exam Vitals signs and nursing note reviewed.  Constitutional:      General: She is not in acute distress.    Appearance: She is well-developed. She is not diaphoretic.  HENT:     Head: Normocephalic and atraumatic.  Eyes:     General: No scleral icterus.    Conjunctiva/sclera: Conjunctivae normal.  Neck:     Musculoskeletal: Normal range of motion.  Cardiovascular:     Rate and Rhythm: Normal rate and regular rhythm.     Heart sounds: Normal heart sounds.  Pulmonary:     Effort: Pulmonary effort is normal. No respiratory distress.     Breath sounds: Normal breath sounds.  Skin:    Findings: No rash.  Neurological:     Mental Status: She is alert.      ED Treatments / Results  Labs (  all labs ordered are listed, but only abnormal results are displayed) Labs Reviewed - No data to display  EKG None  Radiology No results found.  Procedures Procedures (including critical care time)  Medications Ordered in ED Medications - No data to display   Initial Impression / Assessment and Plan / ED Course  I have reviewed the triage vital signs and the nursing notes.  Pertinent labs & imaging results that were available during my care of the patient were reviewed by me and considered in my medical decision making (see chart for details).        63 year old female presents to ED for generalized muscle spasms.  108 of her history taking time is spent telling me about her social issues such as not being able to get Medicaid, being  away from her family, feeling like the providers at her appointment yesterday were making fun of her and labeled her as a drug addict.  She denies any chest pain, shortness of breath or vomiting.  Her vital signs are within normal limits.  No abnormalities noted on physical exam.  Will give muscle relaxer but will consult social work for help with obtaining medications.  Patient is hemodynamically stable, in NAD, and able to ambulate in the ED. Evaluation does not show pathology that would require ongoing emergent intervention or inpatient treatment.   Portions of this note were generated with Lobby[employer]. Dictation errors may occur despite best attempts at proofreading.   Final Clinical Impressions(s) / ED Diagnoses   Final diagnoses:  Muscle spasm of back    ED Discharge Orders         Ordered    methocarbamol (ROBAXIN) 500 MG tablet  2 times daily     02/10/19 1646           Delia Heady, PA-C 02/10/19 1659    Isla Pence, MD 02/10/19 2257

## 2019-02-10 NOTE — ED Provider Notes (Signed)
Patient care assumed from [facility], PA-C at shift change.  See her note for full H&P.  Per her note, "Cassidy Larson is a 63 y.o. female with a past medical history of CKD, cocaine abuse, hypertension, polysubstance abuse presents to ED for generalized body spasms.  Patient states that she has been suffering from these symptoms for the past several years.  She has been taking over-the-counter medication such as Tylenol with no improvement.  Patient mostly speaking about being lost, away from her family, not getting Medicaid, providers making fun of her at her doctor's office appointment yesterday, feeling like she is labeled as a drug addict.  She denies any chest pain, shortness of breath, vomiting, SI, HI, AVH."  At shift change, patient awaiting case management consult.  Following this she is safe for discharge.  Physical Exam  BP (!) 168/98   Pulse 79   Temp 98.9 F (37.2 C) (Oral)   Resp 13   Ht 5\' 2"  (1.575 m)   Wt 43.5 kg   SpO2 96%   BMI 17.56 kg/m   Physical Exam Vitals signs and nursing note reviewed.  Constitutional:      General: She is not in acute distress.    Appearance: She is well-developed.  HENT:     Head: Normocephalic.  Eyes:     Conjunctiva/sclera: Conjunctivae normal.  Neck:     Musculoskeletal: Neck supple.  Cardiovascular:     Rate and Rhythm: Normal rate.  Pulmonary:     Effort: Pulmonary effort is normal.  Musculoskeletal: Normal range of motion.  Skin:    General: Skin is warm and dry.  Neurological:     Mental Status: She is alert.       ED Course/Procedures     Procedures  MDM   63 year old female presenting with anxiety and generalized body aches.  No SI, HI or AVH.  Does not warrant TTS eval at this time.  Is complaining of chronic generalized body pain.  Prior provider prescribed muscle relaxer for her.  Also awaiting consult social work pending discharge.  Social work evaluated patient to discuss resources.  Patient stable for  discharge.       Bishop Dublin 02/10/19 1943    Isla Pence, MD 02/10/19 2257

## 2019-02-10 NOTE — ED Triage Notes (Signed)
Pt BIBA from street. Pt is stating that she is anxious and upset. Pt c/o chronic generalized pain.

## 2019-02-10 NOTE — ED Notes (Signed)
Pt verbalized discharge instructions and follow up care. Alert and ambulatory. Leaving on bus to go back to shelter.

## 2019-02-10 NOTE — ED Notes (Signed)
No respiratory or acute distress noted resting in bed with eyes closed call light in reach. 

## 2019-02-16 IMAGING — DX DG WRIST COMPLETE 3+V*L*
4 series · 4 of 4 positions shown · non-contrast
Comparison: No recent prior.

CLINICAL DATA: Fall.  Wrist injury.

EXAM:
LEFT WRIST - COMPLETE 3+ VIEW

[wrist pa]
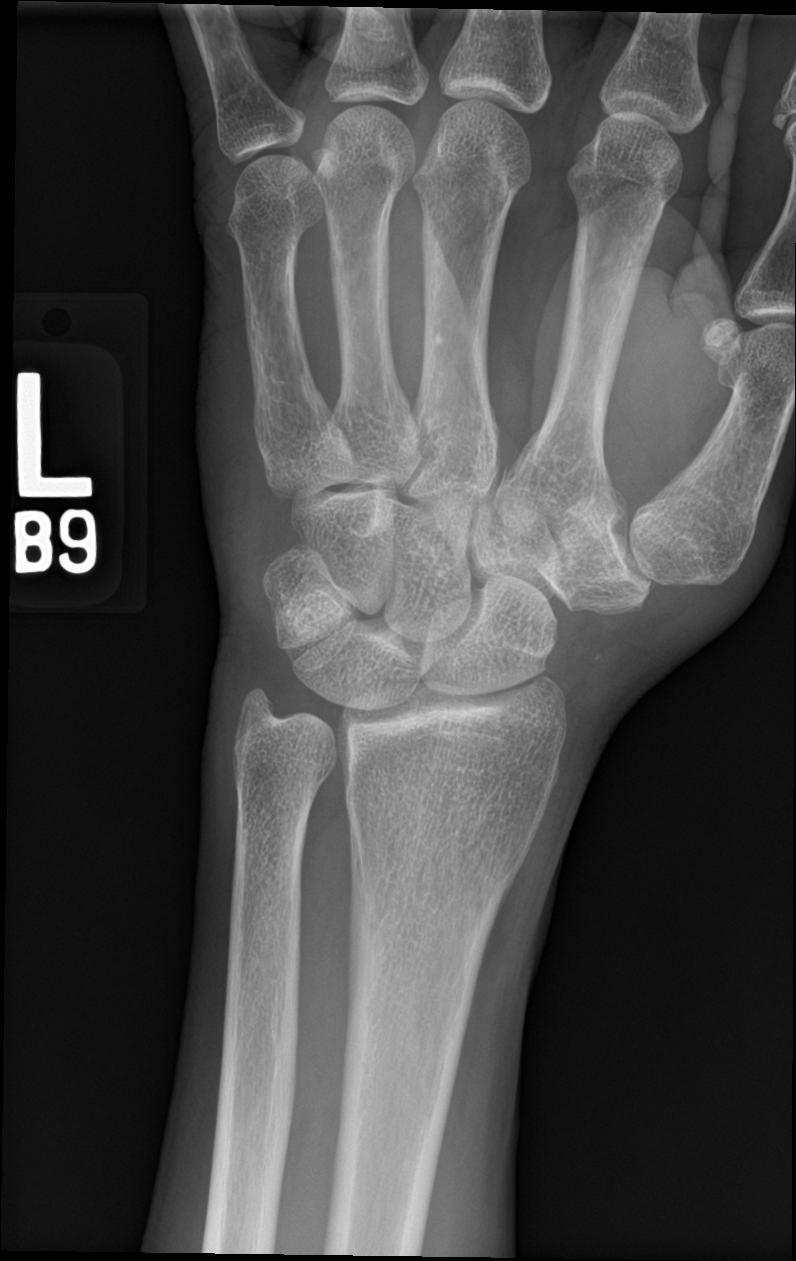

[wrist navicular]
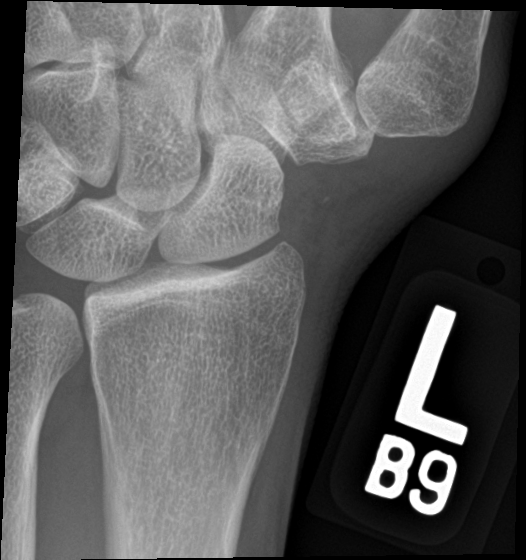

[wrist obl]
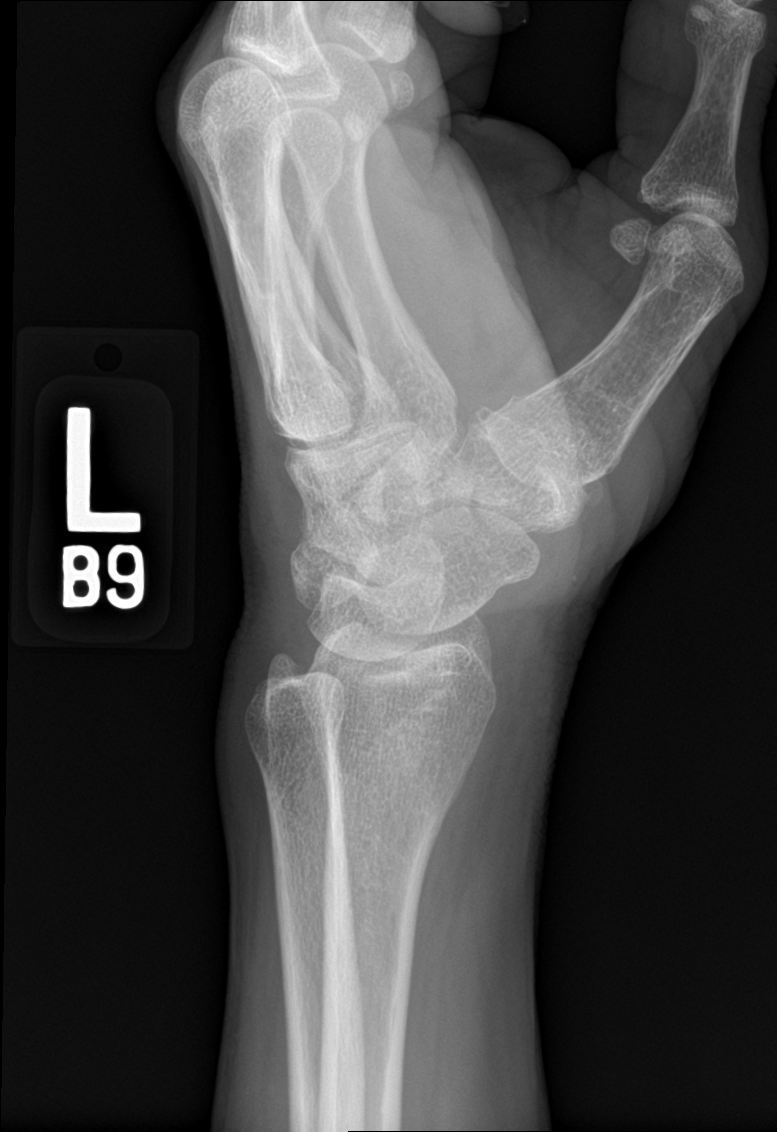

[wrist lat]
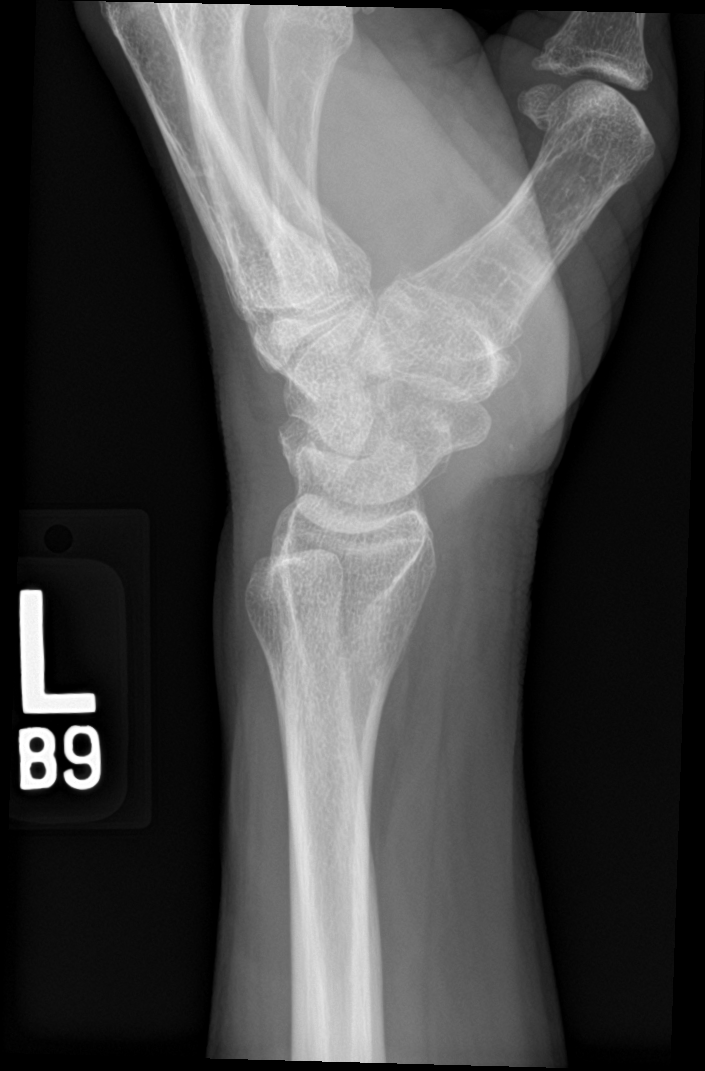

[4 of 4 positions shown; findings below may reference images not displayed]

FINDINGS: No acute bony or joint abnormality identified. No evidence of
fracture or dislocation. Punctate calcification noted over the lower
aspect of the radial aspect of the left wrist. This is most likely
dystrophic.
IMPRESSION: No acute bony or joint abnormality.

## 2019-02-23 ENCOUNTER — Ambulatory Visit: Payer: Medicaid Other

## 2019-03-05 ENCOUNTER — Telehealth: Payer: Self-pay | Admitting: Licensed Clinical Social Worker

## 2019-03-05 NOTE — Telephone Encounter (Signed)
Call placed to patient to follow up on appropriateness for behavioral health services. LCSW left message with shelter staff for a return call.

## 2019-03-08 NOTE — Progress Notes (Signed)
COVID-19 Screening performed. Temperature, PHQ-9, and need for medical care and medications assessed. No additional needs assessed at this time.  Erica Richwine MSN, RN 

## 2019-03-09 NOTE — Progress Notes (Signed)
COVID-19 Screening performed. Temperature, PHQ-9, and need for medical care and medications assessed. No additional needs assessed at this time.  Quita Mcgrory MSN, RN 

## 2020-07-10 ENCOUNTER — Emergency Department (HOSPITAL_COMMUNITY)
Admission: EM | Admit: 2020-07-10 | Discharge: 2020-07-10 | Disposition: A | Discharge status: Home / Self Care | Payer: Medicaid Other | Attending: Emergency Medicine | Admitting: Emergency Medicine

## 2020-07-10 ENCOUNTER — Encounter (HOSPITAL_COMMUNITY): Payer: Self-pay | Admitting: Emergency Medicine

## 2020-07-10 DIAGNOSIS — N184 Chronic kidney disease, stage 4 (severe): Secondary | ICD-10-CM | POA: Diagnosis not present

## 2020-07-10 DIAGNOSIS — I10 Essential (primary) hypertension: Secondary | ICD-10-CM

## 2020-07-10 DIAGNOSIS — F17210 Nicotine dependence, cigarettes, uncomplicated: Secondary | ICD-10-CM | POA: Insufficient documentation

## 2020-07-10 DIAGNOSIS — R52 Pain, unspecified: Secondary | ICD-10-CM | POA: Insufficient documentation

## 2020-07-10 DIAGNOSIS — I129 Hypertensive chronic kidney disease with stage 1 through stage 4 chronic kidney disease, or unspecified chronic kidney disease: Secondary | ICD-10-CM | POA: Diagnosis not present

## 2020-07-10 DIAGNOSIS — W19XXXA Unspecified fall, initial encounter: Secondary | ICD-10-CM | POA: Insufficient documentation

## 2020-07-10 LAB — COMPREHENSIVE METABOLIC PANEL
ALT: 15 U/L (ref 0–44)
AST: 18 U/L (ref 15–41)
Albumin: 2.9 g/dL — ABNORMAL LOW (ref 3.5–5.0)
Alkaline Phosphatase: 61 U/L (ref 38–126)
Anion gap: 12 (ref 5–15)
BUN: 21 mg/dL (ref 8–23)
CO2: 23 mmol/L (ref 22–32)
Calcium: 8.7 mg/dL — ABNORMAL LOW (ref 8.9–10.3)
Chloride: 102 mmol/L (ref 98–111)
Creatinine, Ser: 1.53 mg/dL — ABNORMAL HIGH (ref 0.44–1.00)
GFR, Estimated: 38 mL/min — ABNORMAL LOW (ref >60)
Glucose, Bld: 109 mg/dL — ABNORMAL HIGH (ref 70–99)
Potassium: 3.4 mmol/L — ABNORMAL LOW (ref 3.5–5.1)
Sodium: 137 mmol/L (ref 135–145)
Total Bilirubin: 0.5 mg/dL (ref 0.3–1.2)
Total Protein: 7.2 g/dL (ref 6.5–8.1)

## 2020-07-10 LAB — CBC
HCT: 36.1 % (ref 36.0–46.0)
Hemoglobin: 12 g/dL (ref 12.0–15.0)
MCH: 31 pg (ref 26.0–34.0)
MCHC: 33.2 g/dL (ref 30.0–36.0)
MCV: 93.3 fL (ref 80.0–100.0)
Platelets: 247 10*3/uL (ref 150–400)
RBC: 3.87 MIL/uL (ref 3.87–5.11)
RDW: 14.3 % (ref 11.5–15.5)
WBC: 6.4 10*3/uL (ref 4.0–10.5)
nRBC: 0 % (ref 0.0–0.2)

## 2020-07-10 MED ORDER — ACETAMINOPHEN 325 MG PO TABS
650.0000 mg | ORAL_TABLET | Freq: Once | ORAL | Status: AC
  Administered 2020-07-10: 650 mg via ORAL
  Filled 2020-07-10: qty 2
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
## 2020-07-10 NOTE — ED Triage Notes (Signed)
Pt  Here from home with c/o pain all over , states that the tylenol that she takes otc  Has "stopped " working

## 2020-07-10 NOTE — Discharge Instructions (Signed)
Please read and follow all provided instructions.  Your diagnoses today include:  1. Generalized pain   2. Fall, initial encounter   3. Hypertension, unspecified type     Tests performed today include:  Vital signs. See below for your results today.  Blood cell counts (white, red, and platelets) Electrolytes  Kidney function test  Medications prescribed:   Tylenol - medication for pain  Home care instructions:  Follow any educational materials contained in this packet.  Follow-up instructions: Please follow-up with your primary care provider as needed for further evaluation of your symptoms.  Return instructions:   Please return to the Emergency Department if you experience worsening symptoms.   Please return if you have any other emergent concerns.  Additional Information:  Your vital signs today were: BP (!) 176/95 (BP Location: Right Arm)    Pulse 64    Temp (!) 97.2 F (36.2 C) (Oral)    Resp 16    SpO2 97%  If your blood pressure (BP) was elevated above 135/85 this visit, please have this repeated by your doctor within one month. ---------------

## 2021-02-05 ENCOUNTER — Ambulatory Visit (INDEPENDENT_AMBULATORY_CARE_PROVIDER_SITE_OTHER): Payer: Medicaid Other | Admitting: Primary Care

## 2021-02-22 ENCOUNTER — Ambulatory Visit (INDEPENDENT_AMBULATORY_CARE_PROVIDER_SITE_OTHER): Payer: Medicaid Other | Admitting: Primary Care

## 2021-03-13 ENCOUNTER — Ambulatory Visit (INDEPENDENT_AMBULATORY_CARE_PROVIDER_SITE_OTHER): Payer: Medicaid Other | Admitting: Primary Care

## 2021-04-11 ENCOUNTER — Ambulatory Visit (INDEPENDENT_AMBULATORY_CARE_PROVIDER_SITE_OTHER): Payer: Medicaid Other | Admitting: Primary Care

## 2021-04-25 ENCOUNTER — Ambulatory Visit (INDEPENDENT_AMBULATORY_CARE_PROVIDER_SITE_OTHER): Payer: Medicaid Other | Admitting: Primary Care

## 2022-01-19 ENCOUNTER — Other Ambulatory Visit: Payer: Self-pay

## 2022-01-19 ENCOUNTER — Emergency Department (HOSPITAL_COMMUNITY): Payer: Medicaid Other

## 2022-01-19 ENCOUNTER — Emergency Department (HOSPITAL_COMMUNITY)
Admission: EM | Admit: 2022-01-19 | Discharge: 2022-01-19 | Disposition: A | Discharge status: Home / Self Care | Payer: Medicaid Other | Attending: Emergency Medicine | Admitting: Emergency Medicine

## 2022-01-19 DIAGNOSIS — Z91040 Latex allergy status: Secondary | ICD-10-CM | POA: Insufficient documentation

## 2022-01-19 DIAGNOSIS — F1410 Cocaine abuse, uncomplicated: Secondary | ICD-10-CM | POA: Insufficient documentation

## 2022-01-19 DIAGNOSIS — N189 Chronic kidney disease, unspecified: Secondary | ICD-10-CM | POA: Diagnosis not present

## 2022-01-19 DIAGNOSIS — I129 Hypertensive chronic kidney disease with stage 1 through stage 4 chronic kidney disease, or unspecified chronic kidney disease: Secondary | ICD-10-CM | POA: Insufficient documentation

## 2022-01-19 DIAGNOSIS — R079 Chest pain, unspecified: Secondary | ICD-10-CM | POA: Diagnosis not present

## 2022-01-19 LAB — BASIC METABOLIC PANEL
Anion gap: 6 (ref 5–15)
BUN: 24 mg/dL — ABNORMAL HIGH (ref 8–23)
CO2: 26 mmol/L (ref 22–32)
Calcium: 8.9 mg/dL (ref 8.9–10.3)
Chloride: 106 mmol/L (ref 98–111)
Creatinine, Ser: 1.53 mg/dL — ABNORMAL HIGH (ref 0.44–1.00)
GFR, Estimated: 38 mL/min — ABNORMAL LOW (ref >60)
Glucose, Bld: 87 mg/dL (ref 70–99)
Potassium: 3.7 mmol/L (ref 3.5–5.1)
Sodium: 138 mmol/L (ref 135–145)

## 2022-01-19 LAB — CBC
HCT: 37.4 % (ref 36.0–46.0)
Hemoglobin: 12.3 g/dL (ref 12.0–15.0)
MCH: 29.7 pg (ref 26.0–34.0)
MCHC: 32.9 g/dL (ref 30.0–36.0)
MCV: 90.3 fL (ref 80.0–100.0)
Platelets: 219 10*3/uL (ref 150–400)
RBC: 4.14 MIL/uL (ref 3.87–5.11)
RDW: 14.5 % (ref 11.5–15.5)
WBC: 4 10*3/uL (ref 4.0–10.5)
nRBC: 0 % (ref 0.0–0.2)

## 2022-01-19 LAB — TROPONIN I (HIGH SENSITIVITY)
Troponin I (High Sensitivity): 10 ng/L (ref <18)
Troponin I (High Sensitivity): 10 ng/L (ref <18)

## 2022-01-19 NOTE — ED Provider Notes (Signed)
[facility] EMERGENCY DEPARTMENT Provider Note   CSN: [id number] Arrival date & time: 01/19/22  1547     History  Chief Complaint  Patient presents with   Chest Pain    Cassidy Larson is a 66 y.o. female.  Cassidy Larson is a 66 y.o. female with a history of polysubstance abuse, hypertension, CKD, anxiety, lupus, who presents to the ED for evaluation of chest pain.  Chest pain started today.  She reports she has frequent intermittent episodes of chest pain.  Currently denies chest pain.  Reports that she has been dealing with a lot of loss and has been using crack cocaine to cope.  Last used crack at 3 PM today just prior to arrival reports she sometimes has chest pain after using crack.  Denies any associated shortness of breath, lightheadedness, syncope.  She reports some depression but denies any suicidal or homicidal thoughts.  No other complaints.  The history is provided by the patient.  Chest Pain      Home Medications Prior to Admission medications   Not on File      Allergies    Aspirin and Latex    Review of Systems   Review of Systems  Cardiovascular:  Positive for chest pain.  All other systems reviewed and are negative.   Physical Exam Updated Vital Signs BP (!) 183/96   Pulse (!) 56   Temp 97.7 F (36.5 C) (Oral)   Resp 16   SpO2 98%  Physical Exam Vitals and nursing note reviewed.  Constitutional:      General: She is not in acute distress.    Appearance: Normal appearance. She is well-developed. She is not diaphoretic.     Comments: Alert, chronically ill-appearing, no acute distress  HENT:     Head: Normocephalic and atraumatic.  Eyes:     General:        Right eye: No discharge.        Left eye: No discharge.     Pupils: Pupils are equal, round, and reactive to light.  Cardiovascular:     Rate and Rhythm: Normal rate and regular rhythm.     Pulses: Normal pulses.          Radial pulses are 2+ on the right side and  2+ on the left side.       Dorsalis pedis pulses are 2+ on the right side and 2+ on the left side.     Heart sounds: Normal heart sounds.  Pulmonary:     Effort: Pulmonary effort is normal. No respiratory distress.     Breath sounds: Normal breath sounds. No wheezing or rales.     Comments: Respirations equal and unlabored, patient able to speak in full sentences, lungs clear to auscultation bilaterally  Chest:     Chest wall: No tenderness.  Abdominal:     General: Bowel sounds are normal. There is no distension.     Palpations: Abdomen is soft. There is no mass.     Tenderness: There is no abdominal tenderness. There is no guarding.     Comments: Abdomen soft, nondistended, nontender to palpation in all quadrants without guarding or peritoneal signs  Musculoskeletal:        General: No deformity.     Cervical back: Neck supple.     Right lower leg: No tenderness. No edema.     Left lower leg: No tenderness. No edema.  Skin:    General: Skin is warm and dry.  Capillary Refill: Capillary refill takes less than 2 seconds.  Neurological:     Mental Status: She is alert and oriented to person, place, and time.     Coordination: Coordination normal.     Comments: Speech is clear, able to follow commands Moves extremities without ataxia, coordination intact  Psychiatric:        Mood and Affect: Mood normal.        Behavior: Behavior normal.     ED Results / Procedures / Treatments   Labs (all labs ordered are listed, but only abnormal results are displayed) Labs Reviewed  BASIC METABOLIC PANEL - Abnormal; Notable for the following components:      Result Value   BUN 24 (*)    Creatinine, Ser 1.53 (*)    GFR, Estimated 38 (*)    All other components within normal limits  CBC  TROPONIN I (HIGH SENSITIVITY)  TROPONIN I (HIGH SENSITIVITY)    EKG EKG Interpretation  Date/Time:  Saturday January 19 2022 15:58:07 EDT Ventricular Rate:  60 PR Interval:  106 QRS  Duration: 74 QT Interval:  416 QTC Calculation: 416 R Axis:   63 Text Interpretation: Sinus rhythm with short PR Otherwise normal ECG When compared with ECG of 09-Aug-2018 10:54, PREVIOUS ECG IS PRESENT since last tracing no significant change Confirmed by Daleen Bo [phone]) on 01/19/2022 5:08:22 PM  Radiology DG Chest 2 View  Result Date: 01/19/2022 CLINICAL DATA:  Chest pain EXAM: CHEST - 2 VIEW COMPARISON:  04/19/2018 FINDINGS: The heart size and mediastinal contours are within normal limits. Both lungs are clear. The visualized skeletal structures are unremarkable. IMPRESSION: Negative. Electronically Signed   By: Rolm Baptise M.D.   On: 01/19/2022 17:00    Procedures Procedures    Medications Ordered in ED Medications - No data to display  ED Course/ Medical Decision Making/ A&P                           Medical Decision Making Amount and/or Complexity of Data Reviewed Labs: ordered. Radiology: ordered.   Patient presents to the emergency department with chest pain. Patient nontoxic appearing, in no apparent distress, vitals without significant abnormality. Fairly benign physical exam.   DDX including but not limited to: ACS, pulmonary embolism, dissection, pneumothorax, pneumonia, arrhythmia, severe anemia, MSK, GERD, anxiety, abdominal process .   Additional history obtained:  Chart & nursing note reviewed.    EKG: Normal sinus rhythm, no STEMI  Lab Tests:  I reviewed & interpreted labs including:  No leukocytosis, normal hemoglobin, creatinine at baseline, troponin negative x2  Imaging Studies ordered:  I ordered and viewed the following imaging, agree with radiologist impression:  No active cardiopulmonary disease  ED Course:    RE-EVAL: Currently denies chest pain  EKG without obvious acute ischemia, delta troponin negative, doubt ACS. Patient is low risk wells, PERC negative, doubt pulmonary embolism. Pain is not a tearing sensation, symmetric pulses, no  widening of mediastinum on CXR, doubt dissection. Cardiac monitor reviewed, no notable arrhythmias or tachycardia   Based on patient's chief complaint, I considered admission might be necessary, however after reassuring ED workup feel patient is reasonable for discharge.   I discussed results, treatment plan, need for PCP follow-up, and return precautions with the patient. Provided opportunity for questions, patient confirmed understanding and is in agreement with plan.     Social determinants:  -Cocaine abuse  Portions of this note were generated with Diplomatic Services operational officer dictation  software. Dictation errors may occur despite best attempts at proofreading.         Final Clinical Impression(s) / ED Diagnoses Final diagnoses:  Intermittent chest pain  Cocaine abuse [facility])    Rx / DC Orders ED Discharge Orders     None         Janet Berlin 01/22/22 2121    Daleen Bo, MD 01/24/22 [phone]

## 2022-01-19 NOTE — ED Notes (Signed)
DC instructions reviewed with pt. PT verbalized understanding. PT DC °

## 2022-01-19 NOTE — ED Triage Notes (Signed)
Per EMS pt from home  Has been dealing with a lot of loss in her life, has been using crack to cope.  Pt has CP today .VSS  1 nitro, refused asa d/t reported allergy.  Multiple health problems.  20G LAC   Pt does endorse using crack last about 1500 today.

## 2022-01-19 NOTE — ED Notes (Signed)
Attempted to call pt's ride several times and left message. Pt's ride's name is Merry Proud.  Name and number are in contacts. Pt provided number so she can keep calling him.  She states he has her keys so she cant get in house even if she finds another ride.

## 2022-01-19 NOTE — Discharge Instructions (Signed)
Your lab work today was reassuring.  Please try and reduce your use of crack cocaine as it could be contributing to your chest pain.  Follow-up with your primary care doctor for continued management and help with your stress and anxiety.  Return for new or worsening symptoms.

## 2022-03-27 ENCOUNTER — Emergency Department (HOSPITAL_COMMUNITY): Payer: Medicaid Other | Admitting: Anesthesiology

## 2022-03-27 ENCOUNTER — Inpatient Hospital Stay (HOSPITAL_COMMUNITY): Payer: Medicaid Other

## 2022-03-27 ENCOUNTER — Inpatient Hospital Stay (HOSPITAL_COMMUNITY)
Admission: EM | Disposition: A | Discharge status: Home / Self Care | Payer: Self-pay | Source: Home / Self Care | Attending: Cardiovascular Disease

## 2022-03-27 ENCOUNTER — Inpatient Hospital Stay (HOSPITAL_COMMUNITY)
Admission: EM | Admit: 2022-03-27 | Discharge: 2022-04-02 | DRG: 321 | Disposition: A | Discharge status: Home / Self Care | Payer: Medicaid Other | Attending: Cardiovascular Disease | Admitting: Cardiovascular Disease

## 2022-03-27 DIAGNOSIS — F1410 Cocaine abuse, uncomplicated: Secondary | ICD-10-CM | POA: Diagnosis present

## 2022-03-27 DIAGNOSIS — F17210 Nicotine dependence, cigarettes, uncomplicated: Secondary | ICD-10-CM

## 2022-03-27 DIAGNOSIS — I2510 Atherosclerotic heart disease of native coronary artery without angina pectoris: Secondary | ICD-10-CM

## 2022-03-27 DIAGNOSIS — R339 Retention of urine, unspecified: Secondary | ICD-10-CM | POA: Diagnosis not present

## 2022-03-27 DIAGNOSIS — Z886 Allergy status to analgesic agent status: Secondary | ICD-10-CM | POA: Diagnosis not present

## 2022-03-27 DIAGNOSIS — R627 Adult failure to thrive: Secondary | ICD-10-CM | POA: Diagnosis present

## 2022-03-27 DIAGNOSIS — Z95820 Peripheral vascular angioplasty status with implants and grafts: Secondary | ICD-10-CM

## 2022-03-27 DIAGNOSIS — E860 Dehydration: Secondary | ICD-10-CM | POA: Diagnosis present

## 2022-03-27 DIAGNOSIS — I10 Essential (primary) hypertension: Secondary | ICD-10-CM

## 2022-03-27 DIAGNOSIS — N184 Chronic kidney disease, stage 4 (severe): Secondary | ICD-10-CM | POA: Diagnosis present

## 2022-03-27 DIAGNOSIS — J9601 Acute respiratory failure with hypoxia: Secondary | ICD-10-CM | POA: Diagnosis not present

## 2022-03-27 DIAGNOSIS — E440 Moderate protein-calorie malnutrition: Secondary | ICD-10-CM | POA: Diagnosis not present

## 2022-03-27 DIAGNOSIS — Z681 Body mass index (BMI) 19 or less, adult: Secondary | ICD-10-CM

## 2022-03-27 DIAGNOSIS — I252 Old myocardial infarction: Secondary | ICD-10-CM

## 2022-03-27 DIAGNOSIS — I2102 ST elevation (STEMI) myocardial infarction involving left anterior descending coronary artery: Principal | ICD-10-CM | POA: Diagnosis present

## 2022-03-27 DIAGNOSIS — E785 Hyperlipidemia, unspecified: Secondary | ICD-10-CM | POA: Diagnosis present

## 2022-03-27 DIAGNOSIS — I493 Ventricular premature depolarization: Secondary | ICD-10-CM | POA: Diagnosis not present

## 2022-03-27 DIAGNOSIS — Z9911 Dependence on respirator [ventilator] status: Secondary | ICD-10-CM

## 2022-03-27 DIAGNOSIS — R451 Restlessness and agitation: Secondary | ICD-10-CM | POA: Diagnosis present

## 2022-03-27 DIAGNOSIS — F419 Anxiety disorder, unspecified: Secondary | ICD-10-CM | POA: Diagnosis present

## 2022-03-27 DIAGNOSIS — E782 Mixed hyperlipidemia: Secondary | ICD-10-CM | POA: Diagnosis not present

## 2022-03-27 DIAGNOSIS — F1910 Other psychoactive substance abuse, uncomplicated: Secondary | ICD-10-CM | POA: Diagnosis not present

## 2022-03-27 DIAGNOSIS — Z720 Tobacco use: Secondary | ICD-10-CM | POA: Diagnosis not present

## 2022-03-27 DIAGNOSIS — I129 Hypertensive chronic kidney disease with stage 1 through stage 4 chronic kidney disease, or unspecified chronic kidney disease: Secondary | ICD-10-CM | POA: Diagnosis not present

## 2022-03-27 DIAGNOSIS — R079 Chest pain, unspecified: Secondary | ICD-10-CM | POA: Diagnosis present

## 2022-03-27 DIAGNOSIS — Z5889 Other problems related to physical environment: Secondary | ICD-10-CM

## 2022-03-27 DIAGNOSIS — D631 Anemia in chronic kidney disease: Secondary | ICD-10-CM | POA: Diagnosis not present

## 2022-03-27 DIAGNOSIS — Z91040 Latex allergy status: Secondary | ICD-10-CM | POA: Diagnosis not present

## 2022-03-27 DIAGNOSIS — R64 Cachexia: Secondary | ICD-10-CM | POA: Diagnosis present

## 2022-03-27 DIAGNOSIS — F1210 Cannabis abuse, uncomplicated: Secondary | ICD-10-CM | POA: Diagnosis present

## 2022-03-27 DIAGNOSIS — I213 ST elevation (STEMI) myocardial infarction of unspecified site: Principal | ICD-10-CM

## 2022-03-27 DIAGNOSIS — I472 Ventricular tachycardia, unspecified: Secondary | ICD-10-CM | POA: Diagnosis not present

## 2022-03-27 DIAGNOSIS — I2582 Chronic total occlusion of coronary artery: Secondary | ICD-10-CM | POA: Diagnosis present

## 2022-03-27 DIAGNOSIS — Z955 Presence of coronary angioplasty implant and graft: Secondary | ICD-10-CM

## 2022-03-27 DIAGNOSIS — N1832 Chronic kidney disease, stage 3b: Secondary | ICD-10-CM | POA: Diagnosis not present

## 2022-03-27 DIAGNOSIS — Z79899 Other long term (current) drug therapy: Secondary | ICD-10-CM

## 2022-03-27 DIAGNOSIS — R197 Diarrhea, unspecified: Secondary | ICD-10-CM | POA: Diagnosis not present

## 2022-03-27 HISTORY — PX: LEFT HEART CATH AND CORONARY ANGIOGRAPHY: CATH118249

## 2022-03-27 HISTORY — PX: CORONARY/GRAFT ACUTE MI REVASCULARIZATION: CATH118305

## 2022-03-27 LAB — CBC WITH DIFFERENTIAL/PLATELET
Abs Immature Granulocytes: 0.03 10*3/uL (ref 0.00–0.07)
Basophils Absolute: 0 10*3/uL (ref 0.0–0.1)
Basophils Relative: 0 %
Eosinophils Absolute: 0.1 10*3/uL (ref 0.0–0.5)
Eosinophils Relative: 1 %
HCT: 32.8 % — ABNORMAL LOW (ref 36.0–46.0)
Hemoglobin: 10.7 g/dL — ABNORMAL LOW (ref 12.0–15.0)
Immature Granulocytes: 0 %
Lymphocytes Relative: 20 %
Lymphs Abs: 1.4 10*3/uL (ref 0.7–4.0)
MCH: 31.3 pg (ref 26.0–34.0)
MCHC: 32.6 g/dL (ref 30.0–36.0)
MCV: 95.9 fL (ref 80.0–100.0)
Monocytes Absolute: 1.1 10*3/uL — ABNORMAL HIGH (ref 0.1–1.0)
Monocytes Relative: 16 %
Neutro Abs: 4.4 10*3/uL (ref 1.7–7.7)
Neutrophils Relative %: 63 %
Platelets: 221 10*3/uL (ref 150–400)
RBC: 3.42 MIL/uL — ABNORMAL LOW (ref 3.87–5.11)
RDW: 14.7 % (ref 11.5–15.5)
WBC: 7 10*3/uL (ref 4.0–10.5)
nRBC: 0 % (ref 0.0–0.2)

## 2022-03-27 LAB — COMPREHENSIVE METABOLIC PANEL
ALT: 16 U/L (ref 0–44)
AST: 26 U/L (ref 15–41)
Albumin: 2.7 g/dL — ABNORMAL LOW (ref 3.5–5.0)
Alkaline Phosphatase: 57 U/L (ref 38–126)
Anion gap: 6 (ref 5–15)
BUN: 43 mg/dL — ABNORMAL HIGH (ref 8–23)
CO2: 24 mmol/L (ref 22–32)
Calcium: 8.3 mg/dL — ABNORMAL LOW (ref 8.9–10.3)
Chloride: 107 mmol/L (ref 98–111)
Creatinine, Ser: 1.43 mg/dL — ABNORMAL HIGH (ref 0.44–1.00)
GFR, Estimated: 41 mL/min — ABNORMAL LOW (ref >60)
Glucose, Bld: 112 mg/dL — ABNORMAL HIGH (ref 70–99)
Potassium: 4.5 mmol/L (ref 3.5–5.1)
Sodium: 137 mmol/L (ref 135–145)
Total Bilirubin: 0.7 mg/dL (ref 0.3–1.2)
Total Protein: 7.1 g/dL (ref 6.5–8.1)

## 2022-03-27 LAB — POCT I-STAT 7, (LYTES, BLD GAS, ICA,H+H)
Acid-base deficit: 14 mmol/L — ABNORMAL HIGH (ref 0.0–2.0)
Bicarbonate: 13.6 mmol/L — ABNORMAL LOW (ref 20.0–28.0)
Calcium, Ion: 0.96 mmol/L — ABNORMAL LOW (ref 1.15–1.40)
HCT: 41 % (ref 36.0–46.0)
Hemoglobin: 13.9 g/dL (ref 12.0–15.0)
O2 Saturation: 100 %
Potassium: 2.8 mmol/L — ABNORMAL LOW (ref 3.5–5.1)
Sodium: 102 mmol/L — CL (ref 135–145)
TCO2: 15 mmol/L — ABNORMAL LOW (ref 22–32)
pCO2 arterial: 35.5 mmHg (ref 32–48)
pH, Arterial: 7.19 — CL (ref 7.35–7.45)
pO2, Arterial: 411 mmHg — ABNORMAL HIGH (ref 83–108)

## 2022-03-27 LAB — LIPID PANEL
Cholesterol: 165 mg/dL (ref 0–200)
HDL: 47 mg/dL (ref >40)
LDL Cholesterol: 111 mg/dL — ABNORMAL HIGH (ref 0–99)
Total CHOL/HDL Ratio: 3.5 RATIO
Triglycerides: 33 mg/dL (ref <150)
VLDL: 7 mg/dL (ref 0–40)

## 2022-03-27 LAB — POCT ACTIVATED CLOTTING TIME
Activated Clotting Time: 377 seconds
Activated Clotting Time: 775 seconds

## 2022-03-27 LAB — HEMOGLOBIN A1C
Hgb A1c MFr Bld: 5.5 % (ref 4.8–5.6)
Mean Plasma Glucose: 111.15 mg/dL

## 2022-03-27 LAB — PROTIME-INR
INR: 1.2 (ref 0.8–1.2)
Prothrombin Time: 15.1 seconds (ref 11.4–15.2)

## 2022-03-27 LAB — TROPONIN I (HIGH SENSITIVITY): Troponin I (High Sensitivity): 11 ng/L (ref <18)

## 2022-03-27 LAB — APTT: aPTT: 26 seconds (ref 24–36)

## 2022-03-27 SURGERY — CORONARY/GRAFT ACUTE MI REVASCULARIZATION
Anesthesia: General

## 2022-03-27 MED ORDER — HYDRALAZINE HCL 20 MG/ML IJ SOLN: 10.0000 mg | INTRAMUSCULAR | Status: AC | PRN

## 2022-03-27 MED ORDER — ATORVASTATIN CALCIUM 80 MG PO TABS
80.0000 mg | ORAL_TABLET | Freq: Every day | ORAL | Status: DC
  Administered 2022-03-28 (×2): 80 mg via NASOGASTRIC
  Filled 2022-03-27 (×2): qty 1

## 2022-03-27 MED ORDER — SODIUM BICARBONATE 8.4 % IV SOLN
INTRAVENOUS | Status: AC
  Filled 2022-03-27: qty 50

## 2022-03-27 MED ORDER — VERAPAMIL HCL 2.5 MG/ML IV SOLN
INTRAVENOUS | Status: DC | PRN
  Administered 2022-03-27: 10 mL via INTRA_ARTERIAL

## 2022-03-27 MED ORDER — TIROFIBAN HCL IN NACL 5-0.9 MG/100ML-% IV SOLN
INTRAVENOUS | Status: AC
  Filled 2022-03-27: qty 100

## 2022-03-27 MED ORDER — ASPIRIN 81 MG PO CHEW
324.0000 mg | CHEWABLE_TABLET | Freq: Once | ORAL | Status: AC
  Administered 2022-03-27: 324 mg via ORAL

## 2022-03-27 MED ORDER — MIDAZOLAM-SODIUM CHLORIDE 100-0.9 MG/100ML-% IV SOLN
INTRAVENOUS | Status: AC
  Filled 2022-03-27: qty 100

## 2022-03-27 MED ORDER — VERAPAMIL HCL 2.5 MG/ML IV SOLN
INTRAVENOUS | Status: AC
  Filled 2022-03-27: qty 2

## 2022-03-27 MED ORDER — HEPARIN (PORCINE) IN NACL 1000-0.9 UT/500ML-% IV SOLN
INTRAVENOUS | Status: DC | PRN
  Administered 2022-03-27 (×2): 500 mL

## 2022-03-27 MED ORDER — HEPARIN SODIUM (PORCINE) 1000 UNIT/ML IJ SOLN
INTRAMUSCULAR | Status: AC
  Filled 2022-03-27: qty 10

## 2022-03-27 MED ORDER — NITROGLYCERIN 1 MG/10 ML FOR IR/CATH LAB
INTRA_ARTERIAL | Status: AC
  Filled 2022-03-27: qty 10

## 2022-03-27 MED ORDER — ACETAMINOPHEN 325 MG PO TABS
650.0000 mg | ORAL_TABLET | ORAL | Status: DC | PRN
  Administered 2022-03-28 – 2022-04-01 (×2): 650 mg via ORAL
  Filled 2022-03-27 (×3): qty 2

## 2022-03-27 MED ORDER — ASPIRIN 81 MG PO CHEW
81.0000 mg | CHEWABLE_TABLET | Freq: Every day | ORAL | Status: DC
  Administered 2022-03-28 – 2022-03-31 (×4): 81 mg via ORAL
  Filled 2022-03-27 (×4): qty 1

## 2022-03-27 MED ORDER — LIDOCAINE HCL (CARDIAC) PF 100 MG/5ML IV SOSY
PREFILLED_SYRINGE | INTRAVENOUS | Status: DC | PRN
  Administered 2022-03-27: 20 mg via INTRATRACHEAL

## 2022-03-27 MED ORDER — TICAGRELOR 90 MG PO TABS
90.0000 mg | ORAL_TABLET | Freq: Two times a day (BID) | ORAL | Status: DC
  Administered 2022-03-28: 90 mg via NASOGASTRIC
  Filled 2022-03-27: qty 1

## 2022-03-27 MED ORDER — MIDAZOLAM-SODIUM CHLORIDE 100-0.9 MG/100ML-% IV SOLN
0.5000 mg/h | INTRAVENOUS | Status: DC
  Administered 2022-03-27: 2 mg/h via INTRAVENOUS

## 2022-03-27 MED ORDER — SODIUM CHLORIDE 0.9 % IV SOLN: INTRAVENOUS | Status: AC

## 2022-03-27 MED ORDER — MIDAZOLAM HCL 2 MG/2ML IJ SOLN
INTRAMUSCULAR | Status: DC | PRN
  Administered 2022-03-27: 2 mg via INTRAVENOUS
  Administered 2022-03-27 (×3): 1 mg via INTRAVENOUS

## 2022-03-27 MED ORDER — HEPARIN (PORCINE) IN NACL 1000-0.9 UT/500ML-% IV SOLN
INTRAVENOUS | Status: AC
  Filled 2022-03-27: qty 1000

## 2022-03-27 MED ORDER — LABETALOL HCL 5 MG/ML IV SOLN: 10.0000 mg | INTRAVENOUS | Status: AC | PRN

## 2022-03-27 MED ORDER — HEPARIN SODIUM (PORCINE) 1000 UNIT/ML IJ SOLN
INTRAMUSCULAR | Status: DC | PRN
  Administered 2022-03-27: 6000 [IU] via INTRAVENOUS

## 2022-03-27 MED ORDER — TIROFIBAN HCL IN NACL 5-0.9 MG/100ML-% IV SOLN
INTRAVENOUS | Status: AC | PRN
  Administered 2022-03-27: .075 ug/kg/min via INTRAVENOUS

## 2022-03-27 MED ORDER — HEPARIN SODIUM (PORCINE) 5000 UNIT/ML IJ SOLN
4000.0000 [IU] | Freq: Three times a day (TID) | INTRAMUSCULAR | Status: DC
  Administered 2022-03-27: 4000 [IU] via SUBCUTANEOUS
  Filled 2022-03-27: qty 0.8

## 2022-03-27 MED ORDER — FENTANYL 2500MCG IN NS 250ML (10MCG/ML) PREMIX INFUSION
INTRAVENOUS | Status: AC
  Filled 2022-03-27: qty 250

## 2022-03-27 MED ORDER — ETOMIDATE 2 MG/ML IV SOLN
INTRAVENOUS | Status: DC | PRN
  Administered 2022-03-27: 14 mg via INTRAVENOUS

## 2022-03-27 MED ORDER — SODIUM CHLORIDE 0.9 % IV SOLN: INTRAVENOUS | Status: DC

## 2022-03-27 MED ORDER — SODIUM CHLORIDE 0.9 % IV SOLN: 250.0000 mL | INTRAVENOUS | Status: DC | PRN

## 2022-03-27 MED ORDER — TIROFIBAN HCL IN NACL 5-0.9 MG/100ML-% IV SOLN: 0.0750 ug/kg/min | INTRAVENOUS | Status: AC

## 2022-03-27 MED ORDER — FENTANYL 2500MCG IN NS 250ML (10MCG/ML) PREMIX INFUSION
0.0000 ug/h | INTRAVENOUS | Status: DC
  Administered 2022-03-27: 50 ug/h via INTRAVENOUS

## 2022-03-27 MED ORDER — TIROFIBAN (AGGRASTAT) BOLUS VIA INFUSION
INTRAVENOUS | Status: DC | PRN
  Administered 2022-03-27: 1075 ug via INTRAVENOUS

## 2022-03-27 MED ORDER — ONDANSETRON HCL 4 MG/2ML IJ SOLN
4.0000 mg | Freq: Four times a day (QID) | INTRAMUSCULAR | Status: DC | PRN
  Administered 2022-04-01: 4 mg via INTRAVENOUS
  Filled 2022-03-27: qty 2

## 2022-03-27 MED ORDER — SODIUM CHLORIDE 0.9% FLUSH
3.0000 mL | Freq: Two times a day (BID) | INTRAVENOUS | Status: DC
  Administered 2022-03-28 – 2022-04-01 (×9): 3 mL via INTRAVENOUS

## 2022-03-27 MED ORDER — POTASSIUM CHLORIDE CRYS ER 20 MEQ PO TBCR: 80.0000 meq | EXTENDED_RELEASE_TABLET | Freq: Once | ORAL | Status: DC

## 2022-03-27 MED ORDER — FENTANYL CITRATE (PF) 100 MCG/2ML IJ SOLN
INTRAMUSCULAR | Status: AC
  Filled 2022-03-27: qty 2

## 2022-03-27 MED ORDER — MIDAZOLAM HCL 2 MG/2ML IJ SOLN
INTRAMUSCULAR | Status: AC
  Filled 2022-03-27: qty 2

## 2022-03-27 MED ORDER — SODIUM BICARBONATE 8.4 % IV SOLN
INTRAVENOUS | Status: DC | PRN
  Administered 2022-03-27: 50 meq via INTRAVENOUS

## 2022-03-27 MED ORDER — SUCCINYLCHOLINE CHLORIDE 200 MG/10ML IV SOSY
PREFILLED_SYRINGE | INTRAVENOUS | Status: DC | PRN
  Administered 2022-03-27: 100 mg via INTRAVENOUS

## 2022-03-27 MED ORDER — FENTANYL CITRATE (PF) 100 MCG/2ML IJ SOLN
INTRAMUSCULAR | Status: DC | PRN
  Administered 2022-03-27 (×4): 50 ug via INTRAVENOUS

## 2022-03-27 MED ORDER — LIDOCAINE HCL (PF) 1 % IJ SOLN
INTRAMUSCULAR | Status: AC
  Filled 2022-03-27: qty 30

## 2022-03-27 MED ORDER — SODIUM CHLORIDE 0.9% FLUSH
3.0000 mL | INTRAVENOUS | Status: DC | PRN
  Administered 2022-03-30 (×2): 3 mL via INTRAVENOUS

## 2022-03-27 MED ORDER — IOHEXOL 350 MG/ML SOLN
INTRAVENOUS | Status: DC | PRN
  Administered 2022-03-27: 135 mL

## 2022-03-27 SURGICAL SUPPLY — 22 items
BALL SAPPHIRE NC24 2.75X12 (BALLOONS) ×1
BALLN TREK OTW 2.5X12 (BALLOONS) ×1
BALLOON SAPPHIRE NC24 2.75X12 (BALLOONS) IMPLANT
BALLOON TREK OTW 2.5X12 (BALLOONS) IMPLANT
CATH INFINITI JR4 5F (CATHETERS) IMPLANT
CATH VISTA GUIDE 6FR XBLAD3.5 (CATHETERS) IMPLANT
DEVICE RAD COMP TR BAND LRG (VASCULAR PRODUCTS) IMPLANT
ELECT DEFIB PAD ADLT CADENCE (PAD) IMPLANT
GLIDESHEATH SLEND SS 6F .021 (SHEATH) IMPLANT
GUIDEWIRE INQWIRE 1.5J.035X260 (WIRE) IMPLANT
INQWIRE 1.5J .035X260CM (WIRE) ×1
KIT HEART LEFT (KITS) ×2 IMPLANT
PACK CARDIAC CATHETERIZATION (CUSTOM PROCEDURE TRAY) ×2 IMPLANT
STENT SYNERGY XD 2.50X16 (Permanent Stent) IMPLANT
SYNERGY XD 2.50X16 (Permanent Stent) ×1 IMPLANT
TRANSDUCER W/STOPCOCK (MISCELLANEOUS) ×2 IMPLANT
TUBING CIL FLEX 10 FLL-RA (TUBING) ×2 IMPLANT
WIRE ASAHI FIELDER XT 300CM (WIRE) IMPLANT
WIRE COUGAR XT STRL 190CM (WIRE) IMPLANT
WIRE COUGAR XT STRL 300CM (WIRE) IMPLANT
WIRE HI TORQ VERSACORE-J 145CM (WIRE) IMPLANT
WIRE HI TORQ WHISPER MS 190CM (WIRE) IMPLANT

## 2022-03-27 NOTE — ED Notes (Signed)
Aspirin 324mg  and heparin 4000 units given per cardiology

## 2022-03-27 NOTE — H&P (Addendum)
Cardiology Admission History and Physical   Patient ID: Cassidy Larson MRN: [id number]; DOB: 26-Jul-1955   Admission date: 03/27/2022  PCP:  Kerin Perna, NP   [place] Providers Cardiologist:  None        Chief Complaint:  chest pain  Patient Profile:   Cassidy Larson is a 66 y.o. female with pmhx cocaine use, MJ use, tobacco use who presents to the ER with crushing chest pain that started 4 hours ago.  History of Present Illness:   Cassidy Larson is a 66 y.o. female with pmhx cocaine use, MJ use, tobacco use who presents to the ER with crushing chest pain that started 4 hours ago.  Pt reports that she was with friends "having a good time" this evening, and developed crushing chest pain around 6pm. She says the pain is left-sided and substernal. She endorses cocaine use today. She says that nothing has alleviated her pain including nitroglycerin. She denies prior cardiac history and reports she does not take any medications at home. With regards to cardiac risk factors, she reports history of tobacco use and cocaine use. She denies history of T2DM or hypertension. She reports that aspirin makes her vomit.  In the ER, patient endorsed 10/10 chest pain that radiated to her back. BP was 120/80s, HR 70s. She was given ASA 325 mg and IV heparin 4000 U. EMS EKG shows ST elevations in precordial leads V2-V4. Discussed with interventional cardiology, and decision was made to proceed to [facility].  In the cath lab, patient received DES x1 to mLAD. She was intubated during the procedure due to increasing sedation requirements and agitation on the table. Otherwise, she tolerated the procedure well.  Past Medical History:  Diagnosis Date   Anxiety    CKD (chronic kidney disease) stage 4, GFR 15-29 ml/min (HCC)    Cocaine abuse (HCC)    Essential hypertension    Lupus (HCC)    Polysubstance abuse (HCC)    Positive ANA (antinuclear antibody)     No past surgical  history on file.   Medications Prior to Admission: Prior to Admission medications   Not on File     Allergies:    Allergies  Allergen Reactions   Aspirin Other (See Comments)    Makes sick on stomach    Latex Other (See Comments)    unspecified    Social History:   Social History   Socioeconomic History   Marital status: Single    Spouse name: Not on file   Number of children: Not on file   Years of education: Not on file   Highest education level: Not on file  Occupational History   Not on file  Tobacco Use   Smoking status: Every Day    Packs/day: 0.50    Types: Cigarettes   Smokeless tobacco: Never  Substance and Sexual Activity   Alcohol use: No   Drug use: Yes    Types: Marijuana, "Crack" cocaine   Sexual activity: Yes    Birth control/protection: None  Other Topics Concern   Not on file  Social History Narrative   Not on file   Social Determinants of Health   Financial Resource Strain: Not on file  Food Insecurity: Not on file  Transportation Needs: Not on file  Physical Activity: Not on file  Stress: Not on file  Social Connections: Not on file  Intimate Partner Violence: Not on file    Family History:   The patient's Family history is  unknown by patient.    ROS:  Please see the history of present illness.  All other ROS reviewed and negative.     Physical Exam/Data:   Vitals:   03/27/22 2202 03/27/22 2203 03/27/22 2204 03/27/22 2205  BP:      Pulse: 75 78 75   Resp: 19 18 19    Temp:    98.2 F (36.8 C)  TempSrc:    Oral  SpO2: 100% 99% 99%    No intake or output data in the 24 hours ending 03/27/22 2248    02/10/2019    1:50 PM 02/09/2019   11:17 AM 08/09/2018   10:59 AM  Last 3 Weights  Weight (lbs) 96 lb 96 lb 9.6 oz 98 lb 1.7 oz  Weight (kg) 43.545 kg 43.817 kg 44.5 kg      General:  cachectic AA female, in mild distress HEENT: normal Neck: no JVD Vascular: No carotid bruits; Distal pulses 2+ bilaterally   Cardiac:   normal S1, S2; RRR; no murmur  Lungs:  clear to auscultation bilaterally, no wheezing, rhonchi or rales  Abd: soft, nontender, no hepatomegaly  Ext: no edema Musculoskeletal:  No deformities, BUE and BLE strength normal and equal Skin: warm and dry  Neuro:  CNs 2-12 intact, no focal abnormalities noted Psych:  Normal affect    EKG:  The ECG that was done was personally reviewed and demonstrates ST elevations in V2-V4  Relevant CV Studies: TTE pending LHC report pending  Laboratory Data: Labs are pending   Radiology/Studies:  None   Assessment and Plan:  Cassidy Larson is a 66 y.o. female with pmhx cocaine use, MJ use, tobacco use who presents to the ER with crushing chest pain that started 4 hours ago. In the ER she was noted to have ST elevations in V2-V4 along with continued chest pain. Given her ST elevations on EKG, continued chest pain, and cardiac risk factors, there was high concern for ACS and she was taken to cath lab. She was found to have 100% occlusion of mLAD and received 1 DES to mLAD through right radial artery approach. She was intubated in the cath lab for increasing sedation requirements and agitation. She will be started on aggrastat, ticagrelor, asa and statin.  #STEMI s/p PCI with DES to mLAD - Start high intensity statin - lipid panel, hemoglobin A1c, TSH  - P2Y12: ticagrelor 80 mg BID - Continue aggrastat - Continue asa 81 mg qD  - f/u TTE  #Agitation Intubated during LHC for agitation and increased sedation requirement. No hypoxic respiratory failure - consult to pulmonology for vent management  #Substance use #Tobacco use In the setting of CAD, should be counseled on smoking cessation - Smoking cessation counseling  Risk Assessment/Risk Scores:    TIMI Risk Score for ST  Elevation MI:   The patient's TIMI risk score is 3, which indicates a 4.4% risk of all cause mortality at 30 days.    Severity of Illness: The appropriate patient status  for this patient is INPATIENT. Inpatient status is judged to be reasonable and necessary in order to provide the required intensity of service to ensure the patient's safety. The patient's presenting symptoms, physical exam findings, and initial radiographic and laboratory data in the context of their chronic comorbidities is felt to place them at high risk for further clinical deterioration. Furthermore, it is not anticipated that the patient will be medically stable for discharge from the hospital within 2 midnights of admission.   *  I certify that at the point of admission it is my clinical judgment that the patient will require inpatient hospital care spanning beyond 2 midnights from the point of admission due to high intensity of service, high risk for further deterioration and high frequency of surveillance required.*   For questions or updates, please contact [place] Please consult [email or web address] for contact info under     Signed, Loel Dubonnet, MD  03/27/2022 10:48 PM   I have personally seen and examined this patient. I agree with the assessment and plan as outlined above.  66 yo female with h/o polysubstance abuse (cocaine, Thc, tobacco) admitted with anterior STEMI. Her mid LAD was occluded. Successful PTCA/DES x 1 mid LAD. LVEDP 17 mmHg. Stable BP during case with systolic BP 419-379.  She was intubated during the case secondary to agitation.   Will admit to the ICU. Will ask PCCM team to assist in ventilator management. Will continue Aggrastat infusion for 4 hours. Will give Brilinta 180 mg po x 1 once OG tube in place. Will then continue ASA/Brilinta for one year. It does not appear that she has a true ASA allergy. Will start high intensity statin. Echo in am. No beta blocker given cocaine abuse. Consider PCI of the Circumflex but this lesion is not critical and it may be best to observe her compliance with medical therapy and cocaine abstinence before placing additional  coronary stents.  Pt stated before the case that she had no family or friends and did not wish to notify anyone of her presence in the hospital.   Lauree Chandler, MD, [facility] 03/27/2022 11:51 PM

## 2022-03-27 NOTE — Anesthesia Postprocedure Evaluation (Signed)
Anesthesia Post Note  Patient: Cassidy Larson  Procedure(s) Performed: Coronary/Graft Acute MI Revascularization     Patient location during evaluation: Cath Lab Anesthesia Type: General Level of consciousness: patient remains intubated per anesthesia plan Pain management: pain level controlled Vital Signs Assessment: post-procedure vital signs reviewed and stable Respiratory status: patient on ventilator - see flowsheet for VS and patient remains intubated per anesthesia plan Cardiovascular status: stable and tachycardic Postop Assessment: no apparent nausea or vomiting Anesthetic complications: no   No notable events documented.  Last Vitals:  Vitals:   03/27/22 2204 03/27/22 2205  BP:    Pulse: 75   Resp: 19   Temp:  36.8 C  SpO2: 99%     Last Pain:  Vitals:   03/27/22 2219  TempSrc:   PainSc: 8                  Adamarys Shall,E. Raeya Merritts

## 2022-03-27 NOTE — Anesthesia Procedure Notes (Signed)
Procedure Name: Intubation Date/Time: 03/27/2022 10:58 PM  Performed by: Clovis Cao, CRNAPre-anesthesia Checklist: Patient identified, Emergency Drugs available, Suction available and Patient being monitored Patient Re-evaluated:Patient Re-evaluated prior to induction Oxygen Delivery Method: Ambu bag Preoxygenation: Pre-oxygenation with 100% oxygen Induction Type: IV induction, Rapid sequence and Cricoid Pressure applied Laryngoscope Size: Glidescope and 3 Grade View: Grade I Tube type: Oral Tube size: 7.0 mm Number of attempts: 1 Airway Equipment and Method: Stylet and Video-laryngoscopy Placement Confirmation: ETT inserted through vocal cords under direct vision, breath sounds checked- equal and bilateral and CO2 detector Secured at: 20 cm Tube secured with: Tape Dental Injury: Teeth and Oropharynx as per pre-operative assessment

## 2022-03-27 NOTE — ED Provider Notes (Signed)
[facility] EMERGENCY DEPARTMENT Provider Note   CSN: [id number] Arrival date & time: 03/27/22  2142     History  No chief complaint on file.   Cassidy Larson is a 66 y.o. female.  The history is provided by the patient and the EMS personnel. No language interpreter was used.  Chest Pain Pain location:  Substernal area Pain quality: crushing, pressure and radiating   Pain radiates to:  Upper back and mid back Pain severity:  Severe Onset quality:  Gradual Timing:  Constant Progression:  Worsening Chronicity:  New Associated symptoms: abdominal pain (epigastric), back pain, diaphoresis, nausea and shortness of breath   Associated symptoms: no cough, no fatigue, no fever, no headache, no palpitations, no vomiting and no weakness        Home Medications Prior to Admission medications   Not on File      Allergies    Aspirin and Latex    Review of Systems   Review of Systems  Constitutional:  Positive for diaphoresis. Negative for fatigue and fever.  HENT:  Negative for congestion.   Respiratory:  Positive for chest tightness and shortness of breath. Negative for cough and wheezing.   Cardiovascular:  Positive for chest pain. Negative for palpitations and leg swelling.  Gastrointestinal:  Positive for abdominal pain (epigastric) and nausea. Negative for constipation, diarrhea and vomiting.  Genitourinary:  Negative for dysuria.  Musculoskeletal:  Positive for back pain. Negative for neck pain and neck stiffness.  Skin:  Negative for rash and wound.  Neurological:  Negative for weakness and headaches.  Psychiatric/Behavioral:  Negative for agitation.   All other systems reviewed and are negative.   Physical Exam Updated Vital Signs BP 136/74   Pulse 75   Temp 98.2 F (36.8 C) (Oral)   Resp 19   SpO2 99%  Physical Exam Vitals and nursing note reviewed.  Constitutional:      General: She is not in acute distress.    Appearance: She is  well-developed. She is ill-appearing. She is not toxic-appearing or diaphoretic.  HENT:     Head: Normocephalic and atraumatic.     Nose: No congestion.     Mouth/Throat:     Mouth: Mucous membranes are moist.  Eyes:     Extraocular Movements: Extraocular movements intact.     Conjunctiva/sclera: Conjunctivae normal.     Pupils: Pupils are equal, round, and reactive to light.  Cardiovascular:     Rate and Rhythm: Normal rate and regular rhythm.     Heart sounds: No murmur heard. Pulmonary:     Effort: Pulmonary effort is normal. No respiratory distress.     Breath sounds: Normal breath sounds. No wheezing, rhonchi or rales.  Chest:     Chest wall: No tenderness.  Abdominal:     General: Abdomen is flat.     Palpations: Abdomen is soft.     Tenderness: There is no abdominal tenderness. There is no guarding or rebound.  Musculoskeletal:        General: No swelling or tenderness.     Cervical back: Neck supple. No tenderness.     Right lower leg: No edema.     Left lower leg: No edema.  Skin:    General: Skin is warm and dry.     Capillary Refill: Capillary refill takes less than 2 seconds.     Findings: No erythema.  Neurological:     Mental Status: She is alert.  Psychiatric:  Mood and Affect: Mood normal.     ED Results / Procedures / Treatments   Labs (all labs ordered are listed, but only abnormal results are displayed) Labs Reviewed  HEMOGLOBIN A1C  CBC WITH DIFFERENTIAL/PLATELET  PROTIME-INR  APTT  COMPREHENSIVE METABOLIC PANEL  LIPID PANEL  TROPONIN I (HIGH SENSITIVITY)    EKG EKG Interpretation  Date/Time:  Wednesday March 27 2022 21:43:42 EDT Ventricular Rate:  74 PR Interval:  105 QRS Duration: 80 QT Interval:  398 QTC Calculation: 442 R Axis:   72 Text Interpretation: Sinus rhythm Short PR interval Anterior infarct, acute (LAD) >>> Acute MI <<< when compared to prior, concerning changes for ischemia. ** ** ACUTE MI / STEMI ** ** Confirmed  by Antony Blackbird [phone]) on 03/27/2022 10:09:36 PM  Radiology No results found.  Procedures Procedures    CRITICAL CARE Performed by: Gwenyth Allegra Lola Lofaro Total critical care time: 34 minutes Critical care time was exclusive of separately billable procedures and treating other patients. Critical care was necessary to treat or prevent imminent or life-threatening deterioration. Critical care was time spent personally by me on the following activities: development of treatment plan with patient and/or surrogate as well as nursing, discussions with consultants, evaluation of patient's response to treatment, examination of patient, obtaining history from patient or surrogate, ordering and performing treatments and interventions, ordering and review of laboratory studies, ordering and review of radiographic studies, pulse oximetry and re-evaluation of patient's condition.   Medications Ordered in ED Medications - No data to display  ED Course/ Medical Decision Making/ A&P                           Medical Decision Making   Cassidy Larson is a 66 y.o. female with a past medical history significant for CKD, hypertension, lupus, and polysubstance abuse who presents as a code STEMI for chest pain today.  According to patient, she did report using marijuana and cocaine while partying earlier and then started having chest pain.  She reports the pain is in her chest and goes to her back and epigastric area.  She did not have elevated blood pressures critically with EMS but says that her chest pain is "100 out of 10".  She reports it feels like a large pressure on her chest.  She does have some shortness of breath and had some nausea but no vomiting reported.  Denies syncope.  Denies trauma.  She denies history of aortic disease or vascular troubles to her knowledge.  She had EKG in route that showed concern for STEMI and it was activated.  Patient initially was taken to a room for evaluation.  On  my exam, she does have palpable pulses in all extremities.  Chest and abdomen were nontender on my exam and lungs were clear.  I did not appreciate a murmur.  Normal bowel sounds were appreciated.  No focal neurologic deficits initially.  Initial EKG does show concern for STEMI.  Cardiology arrived as patient arrived to the emergency department and followed her into the room.  Based on the new EKG, they will take her to Cath Lab for likely STEMI.  Acute dissection was considered versus vasospasm given her reported cocaine use tonight however, with her EKG changes and the continued chest pain, they will take her to the Cath Lab.  They gave her heparin and I ordered other STEMI labs.  Anticipate admission after management.  Patient admitted by cardiology after Cath Lab.  Final Clinical Impression(s) / ED Diagnoses Final diagnoses:  ST elevation myocardial infarction (STEMI), unspecified artery (HCC)  Chest pain of uncertain etiology     Clinical Impression: 1. ST elevation myocardial infarction (STEMI), unspecified artery ([place])   2. Chest pain of uncertain etiology     Disposition: Admit  This note was prepared with assistance of Dragon voice recognition software. Occasional wrong-word or sound-a-like substitutions may have occurred due to the inherent limitations of voice recognition software.      Chrles Selley, Gwenyth Allegra, MD 03/27/22 2234

## 2022-03-27 NOTE — Anesthesia Preprocedure Evaluation (Signed)
Anesthesia Evaluation  Patient identified by MRN, date of birth, ID band Patient awake    Reviewed: Allergy & Precautions, NPO status , Patient's Chart, lab work & pertinent test resultsPreop documentation limited or incomplete due to emergent nature of procedure.  History of Anesthesia Complications Negative for: history of anesthetic complications  Airway Mallampati: I  TM Distance: >3 FB Neck ROM: Full    Dental  (+) Edentulous Upper, Edentulous Lower   Pulmonary Current SmokerPatient did not abstain from smoking.,    breath sounds clear to auscultation       Cardiovascular hypertension, + CAD and + Past MI (acute anterior MI)   Rhythm:Regular Rate:Tachycardia  STEMI, VSS   Neuro/Psych negative neurological ROS     GI/Hepatic negative GI ROS, (+)     substance abuse  cocaine use and marijuana use,   Endo/Other  negative endocrine ROS  Renal/GU Renal InsufficiencyRenal disease     Musculoskeletal   Abdominal   Peds  Hematology   Anesthesia Other Findings   Reproductive/Obstetrics                             Anesthesia Physical Anesthesia Plan  ASA: 4  Anesthesia Plan: General   Post-op Pain Management: Minimal or no pain anticipated   Induction: Intravenous  PONV Risk Score and Plan: 3 and Treatment may vary due to age or medical condition  Airway Management Planned: Oral ETT  Additional Equipment: None  Intra-op Plan:   Post-operative Plan: Post-operative intubation/ventilation  Informed Consent: I have reviewed the patients History and Physical, chart, labs and discussed the procedure including the risks, benefits and alternatives for the proposed anesthesia with the patient or authorized representative who has indicated his/her understanding and acceptance.     Only emergency history available  Plan Discussed with: CRNA and Surgeon  Anesthesia Plan Comments:  (Consent from patient)        Anesthesia Quick Evaluation

## 2022-03-27 NOTE — ED Triage Notes (Signed)
Patient arrived via [facility], found on street, complaining of chest pain, rates pain 10/10 arrival to ED. Code STEMI was called en-route. Patient alerts and oriented x4 at arrival to ED.

## 2022-03-27 NOTE — Transfer of Care (Signed)
Immediate Anesthesia Transfer of Care Note  Patient: Cassidy Larson  Procedure(s) Performed: Coronary/Graft Acute MI Revascularization  Patient Location: Cath Lab  Anesthesia Type:General  Level of Consciousness: Patient remains intubated per anesthesia plan  Airway & Oxygen Therapy: Patient remains intubated per anesthesia plan and Patient placed on Ventilator (see vital sign flow sheet for setting)  Post-op Assessment: Report given to RN and Post -op Vital signs reviewed and stable  Post vital signs: Reviewed and stable  Last Vitals:  Vitals Value Taken Time  BP    Temp    Pulse    Resp    SpO2      Last Pain:  Vitals:   03/27/22 2219  TempSrc:   PainSc: 8          Complications: No notable events documented.

## 2022-03-28 ENCOUNTER — Telehealth (HOSPITAL_COMMUNITY): Payer: Self-pay | Admitting: Pharmacy Technician

## 2022-03-28 ENCOUNTER — Encounter (HOSPITAL_COMMUNITY): Payer: Self-pay | Admitting: Cardiovascular Disease

## 2022-03-28 ENCOUNTER — Inpatient Hospital Stay (HOSPITAL_COMMUNITY): Payer: Medicaid Other

## 2022-03-28 ENCOUNTER — Other Ambulatory Visit (HOSPITAL_COMMUNITY): Payer: Self-pay

## 2022-03-28 DIAGNOSIS — Z9911 Dependence on respirator [ventilator] status: Secondary | ICD-10-CM

## 2022-03-28 DIAGNOSIS — Z720 Tobacco use: Secondary | ICD-10-CM | POA: Diagnosis present

## 2022-03-28 DIAGNOSIS — I2510 Atherosclerotic heart disease of native coronary artery without angina pectoris: Secondary | ICD-10-CM | POA: Diagnosis not present

## 2022-03-28 DIAGNOSIS — N1832 Chronic kidney disease, stage 3b: Secondary | ICD-10-CM

## 2022-03-28 DIAGNOSIS — F1410 Cocaine abuse, uncomplicated: Secondary | ICD-10-CM | POA: Diagnosis not present

## 2022-03-28 DIAGNOSIS — J9601 Acute respiratory failure with hypoxia: Secondary | ICD-10-CM | POA: Diagnosis not present

## 2022-03-28 DIAGNOSIS — F1910 Other psychoactive substance abuse, uncomplicated: Secondary | ICD-10-CM

## 2022-03-28 HISTORY — DX: Dependence on respirator (ventilator) status: Z99.11

## 2022-03-28 LAB — POCT I-STAT 7, (LYTES, BLD GAS, ICA,H+H)
Acid-Base Excess: 0 mmol/L (ref 0.0–2.0)
Acid-Base Excess: 0 mmol/L (ref 0.0–2.0)
Bicarbonate: 20.7 mmol/L (ref 20.0–28.0)
Bicarbonate: 24.5 mmol/L (ref 20.0–28.0)
Calcium, Ion: 1.11 mmol/L — ABNORMAL LOW (ref 1.15–1.40)
Calcium, Ion: 1.19 mmol/L (ref 1.15–1.40)
HCT: 30 % — ABNORMAL LOW (ref 36.0–46.0)
HCT: 32 % — ABNORMAL LOW (ref 36.0–46.0)
Hemoglobin: 10.2 g/dL — ABNORMAL LOW (ref 12.0–15.0)
Hemoglobin: 10.9 g/dL — ABNORMAL LOW (ref 12.0–15.0)
O2 Saturation: 100 %
O2 Saturation: 98 %
Patient temperature: 98
Patient temperature: 98.7
Potassium: 4.2 mmol/L (ref 3.5–5.1)
Potassium: 4.8 mmol/L (ref 3.5–5.1)
Sodium: 138 mmol/L (ref 135–145)
Sodium: 140 mmol/L (ref 135–145)
TCO2: 21 mmol/L — ABNORMAL LOW (ref 22–32)
TCO2: 26 mmol/L (ref 22–32)
pCO2 arterial: 23.2 mmHg — ABNORMAL LOW (ref 32–48)
pCO2 arterial: 36.5 mmHg (ref 32–48)
pH, Arterial: 7.434 (ref 7.35–7.45)
pH, Arterial: 7.558 — ABNORMAL HIGH (ref 7.35–7.45)
pO2, Arterial: 217 mmHg — ABNORMAL HIGH (ref 83–108)
pO2, Arterial: 95 mmHg (ref 83–108)

## 2022-03-28 LAB — CBC
HCT: 34.4 % — ABNORMAL LOW (ref 36.0–46.0)
Hemoglobin: 11.3 g/dL — ABNORMAL LOW (ref 12.0–15.0)
MCH: 30.5 pg (ref 26.0–34.0)
MCHC: 32.8 g/dL (ref 30.0–36.0)
MCV: 93 fL (ref 80.0–100.0)
Platelets: 250 10*3/uL (ref 150–400)
RBC: 3.7 MIL/uL — ABNORMAL LOW (ref 3.87–5.11)
RDW: 14.5 % (ref 11.5–15.5)
WBC: 5.3 10*3/uL (ref 4.0–10.5)
nRBC: 0 % (ref 0.0–0.2)

## 2022-03-28 LAB — BASIC METABOLIC PANEL
Anion gap: 8 (ref 5–15)
Anion gap: 6 (ref 5–15)
BUN: 38 mg/dL — ABNORMAL HIGH (ref 8–23)
BUN: 34 mg/dL — ABNORMAL HIGH (ref 8–23)
CO2: 21 mmol/L — ABNORMAL LOW (ref 22–32)
CO2: 22 mmol/L (ref 22–32)
Calcium: 8.2 mg/dL — ABNORMAL LOW (ref 8.9–10.3)
Calcium: 8.3 mg/dL — ABNORMAL LOW (ref 8.9–10.3)
Chloride: 107 mmol/L (ref 98–111)
Chloride: 111 mmol/L (ref 98–111)
Creatinine, Ser: 1.25 mg/dL — ABNORMAL HIGH (ref 0.44–1.00)
Creatinine, Ser: 1.21 mg/dL — ABNORMAL HIGH (ref 0.44–1.00)
GFR, Estimated: 48 mL/min — ABNORMAL LOW (ref >60)
GFR, Estimated: 50 mL/min — ABNORMAL LOW (ref >60)
Glucose, Bld: 99 mg/dL (ref 70–99)
Glucose, Bld: 84 mg/dL (ref 70–99)
Potassium: 3.7 mmol/L (ref 3.5–5.1)
Potassium: 4.6 mmol/L (ref 3.5–5.1)
Sodium: 136 mmol/L (ref 135–145)
Sodium: 139 mmol/L (ref 135–145)

## 2022-03-28 LAB — MRSA NEXT GEN BY PCR, NASAL: MRSA by PCR Next Gen: NOT DETECTED

## 2022-03-28 LAB — MAGNESIUM
Magnesium: 1.8 mg/dL (ref 1.7–2.4)
Magnesium: 2.4 mg/dL (ref 1.7–2.4)

## 2022-03-28 LAB — ECHOCARDIOGRAM COMPLETE
Height: 62 in
Weight: 1439.16 oz

## 2022-03-28 LAB — TROPONIN I (HIGH SENSITIVITY): Troponin I (High Sensitivity): 19192 ng/L (ref <18)

## 2022-03-28 MED ORDER — TICAGRELOR 90 MG PO TABS
90.0000 mg | ORAL_TABLET | Freq: Two times a day (BID) | ORAL | Status: DC
  Administered 2022-03-28 – 2022-04-02 (×10): 90 mg via ORAL
  Filled 2022-03-28 (×10): qty 1

## 2022-03-28 MED ORDER — CARVEDILOL 6.25 MG PO TABS
6.2500 mg | ORAL_TABLET | Freq: Two times a day (BID) | ORAL | Status: DC
  Administered 2022-03-29 – 2022-04-02 (×9): 6.25 mg via ORAL
  Filled 2022-03-28 (×9): qty 1

## 2022-03-28 MED ORDER — HYDRALAZINE HCL 25 MG PO TABS
25.0000 mg | ORAL_TABLET | Freq: Three times a day (TID) | ORAL | Status: DC
  Administered 2022-03-28: 25 mg
  Filled 2022-03-28 (×2): qty 1

## 2022-03-28 MED ORDER — POTASSIUM CHLORIDE 20 MEQ PO PACK
80.0000 meq | PACK | Freq: Once | ORAL | Status: AC
  Administered 2022-03-28: 80 meq
  Filled 2022-03-28: qty 4

## 2022-03-28 MED ORDER — TICAGRELOR 90 MG PO TABS
180.0000 mg | ORAL_TABLET | Freq: Once | ORAL | Status: AC
  Administered 2022-03-28: 180 mg via NASOGASTRIC
  Filled 2022-03-28: qty 2

## 2022-03-28 MED ORDER — ENOXAPARIN SODIUM 30 MG/0.3ML IJ SOSY: 30.0000 mg | PREFILLED_SYRINGE | INTRAMUSCULAR | Status: DC

## 2022-03-28 MED ORDER — METHYLPREDNISOLONE SODIUM SUCC 40 MG IJ SOLR
40.0000 mg | Freq: Once | INTRAMUSCULAR | Status: AC
  Administered 2022-03-28: 40 mg via INTRAVENOUS
  Filled 2022-03-28: qty 1

## 2022-03-28 MED ORDER — ORAL CARE MOUTH RINSE: 15.0000 mL | OROMUCOSAL | Status: DC | PRN

## 2022-03-28 MED ORDER — ENOXAPARIN SODIUM 30 MG/0.3ML IJ SOSY
30.0000 mg | PREFILLED_SYRINGE | INTRAMUSCULAR | Status: DC
  Administered 2022-03-28 – 2022-03-31 (×4): 30 mg via SUBCUTANEOUS
  Filled 2022-03-28 (×4): qty 0.3

## 2022-03-28 MED ORDER — MAGNESIUM SULFATE 2 GM/50ML IV SOLN
2.0000 g | Freq: Once | INTRAVENOUS | Status: AC
  Administered 2022-03-28: 2 g via INTRAVENOUS
  Filled 2022-03-28: qty 50

## 2022-03-28 MED ORDER — ORAL CARE MOUTH RINSE
15.0000 mL | OROMUCOSAL | Status: DC
  Administered 2022-03-28 (×5): 15 mL via OROMUCOSAL

## 2022-03-28 MED ORDER — CHLORHEXIDINE GLUCONATE CLOTH 2 % EX PADS
6.0000 | MEDICATED_PAD | Freq: Every day | CUTANEOUS | Status: DC
  Administered 2022-03-28: 6 via TOPICAL

## 2022-03-28 MED ORDER — RACEPINEPHRINE HCL 2.25 % IN NEBU
0.5000 mL | INHALATION_SOLUTION | Freq: Once | RESPIRATORY_TRACT | Status: AC
  Administered 2022-03-28: 0.5 mL via RESPIRATORY_TRACT
  Filled 2022-03-28: qty 0.5

## 2022-03-28 MED ORDER — ATORVASTATIN CALCIUM 80 MG PO TABS
80.0000 mg | ORAL_TABLET | Freq: Every day | ORAL | Status: DC
  Administered 2022-03-29 – 2022-04-02 (×5): 80 mg via ORAL
  Filled 2022-03-28 (×5): qty 1

## 2022-03-28 MED ORDER — FENTANYL BOLUS VIA INFUSION: 30.0000 ug | INTRAVENOUS | Status: DC | PRN

## 2022-03-28 MED ORDER — CARVEDILOL 6.25 MG PO TABS
6.2500 mg | ORAL_TABLET | Freq: Two times a day (BID) | ORAL | Status: DC
  Administered 2022-03-28 (×2): 6.25 mg
  Filled 2022-03-28 (×2): qty 1

## 2022-03-28 MED ORDER — HALOPERIDOL LACTATE 5 MG/ML IJ SOLN: 1.0000 mg | Freq: Four times a day (QID) | INTRAMUSCULAR | Status: DC | PRN

## 2022-03-28 MED ORDER — HYDRALAZINE HCL 25 MG PO TABS
25.0000 mg | ORAL_TABLET | Freq: Three times a day (TID) | ORAL | Status: DC
  Administered 2022-03-28 – 2022-03-31 (×9): 25 mg via ORAL
  Filled 2022-03-28 (×9): qty 1

## 2022-03-28 MED ORDER — HYDRALAZINE HCL 25 MG PO TABS
25.0000 mg | ORAL_TABLET | Freq: Three times a day (TID) | ORAL | Status: DC
  Administered 2022-03-28: 25 mg

## 2022-03-28 MED FILL — Nitroglycerin IV Soln 100 MCG/ML in D5W: INTRA_ARTERIAL | Qty: 10 | Status: AC

## 2022-03-28 MED FILL — Heparin Sod (Porcine)-NaCl IV Soln 1000 Unit/500ML-0.9%: INTRAVENOUS | Qty: 1000 | Status: AC

## 2022-03-28 NOTE — Progress Notes (Signed)
RT and RN transported vent patient from Cath lab to [place]. Vital signs stable through out.

## 2022-03-28 NOTE — Telephone Encounter (Signed)
Pharmacy Patient Advocate Encounter  Insurance verification completed.    The patient is insured through [place] [employer]   The patient is currently admitted and ran test claims for the following: Cassidy Larson, Jardiance.  Copays and coinsurance results were relayed to Inpatient clinical team.

## 2022-03-28 NOTE — Progress Notes (Addendum)
2000 patient alert x4 able to make all needs known call light in reach on 2L [place] patient doesn't want to take off sp02 100%. Pt states she thinks she breathes better with it on decreased to 1L . [place] swallow completed at bedside patient passed had pudding after.   03/29/2022  0445 5 beat v-run patient was sleeping  0630 patient walked to bathroom tolerated well.

## 2022-03-28 NOTE — Procedures (Signed)
Extubation Procedure Note  Patient Details:   Name: Darrion Macaulay DOB: Jul 11, 1955 MRN: [id number]   Airway Documentation:  Airway 7 mm (Active)  Secured at (cm) 22 cm 03/28/22 0852  Measured From Lips 03/28/22 0852  Secured Location Left 03/28/22 0852  Secured By [employer] 03/28/22 0852  Tube Holder Repositioned Yes 03/28/22 0840  Prone position No 03/28/22 0800  Cuff Pressure (cm H2O) Green OR 18-26 CmH2O 03/28/22 0840  Site Condition Dry 03/28/22 0852   Vent end date: (not recorded) Vent end time: (not recorded)   Evaluation  O2 sats: stable throughout Complications: No apparent complications Patient did tolerate procedure well. Bilateral Breath Sounds: Rhonchi   Yes  Patient was extubated to a 3L [place] without any complications, dyspnea or stridor noted. Positive cuff leak prior to extubation.   Claretta Fraise 03/28/2022, 9:27 AM

## 2022-03-28 NOTE — Consult Note (Signed)
NAME:  Cassidy Larson, MRN:  [id number], DOB:  April 04, 1956, LOS: 1 ADMISSION DATE:  03/27/2022, CONSULTATION DATE:  03/27/22  REFERRING MD:  cardiology , CHIEF COMPLAINT:  chest pain    History of Present Illness:  66 yo woman admitted with stemi, taken to cath lab for PCI, stent placed.   Chest pain began around 6pm. L side/substernal.  No resolution with NTG.  Radiation to midback.    In ED, given ASA 324, heparin 4000U DES to mid LAD   Intubated during the procedure due to increased sedation requirements for agitation during procedure.  11pm 7.19/35/411 (RR 28, Tv  400, 100%) Pertinent  Medical History  Tobacco use Cocaine use Anxiety,  CKD HTN Lupus Pos ANA   Significant Hospital Events: Including procedures, antibiotic start and stop dates in addition to other pertinent events   STEMI> LAD stent, ibtubated  Interim History / Subjective:  Awake, she denies complaints this morning.  Objective   Blood pressure 135/78, pulse 76, temperature 98.7 F (37.1 C), temperature source Oral, resp. rate 19, height 5\' 2"  (1.575 m), weight 40.8 kg, SpO2 99 %.    Vent Mode: CPAP;PSV FiO2 (%):  [30 %-100 %] 30 % Set Rate:  [16 bmp-28 bmp] 16 bmp Vt Set:  [400 mL] 400 mL PEEP:  [5 cmH20] 5 cmH20 Pressure Support:  [10 cmH20] 10 cmH20 Plateau Pressure:  [19 cmH20-23 cmH20] 21 cmH20   Intake/Output Summary (Last 24 hours) at 03/28/2022 0914 Last data filed at 03/28/2022 0800 Gross per 24 hour  Intake 559.7 ml  Output 200 ml  Net 359.7 ml    Filed Weights   03/28/22 0000  Weight: 40.8 kg    Examination: General: awake, alert, lying in bed in NAD on MV HENT: Bier/AT, eyes anicteric Lungs: breathing comfortably on 5/5, cTAB Cardiovascular: S1S2, RRR Abdomen: soft, NT, ND Extremities: no c/c/e Neuro: awake, alert, moving all extremities, following commands, nodding to answer questions Derm: warm, dry, no rashes  BUN 34 Cr 1.21   Resolved Hospital Problem list      Assessment & Plan:  S/p STEMI, PCI -DAPT -statin -coreg -start hydralazine -need to watch renal function carefully when starting ARB -echo ordered -monitor on tele -cardiac rehab, OOB mobility  Acute respiratory failure with hypoxia requiring MV -LTVV -SAT & SBT -extubate to [place] -maintain euvolemia  Cocaine abuse -recommend cessation -only Bblocker appropriate is coreg with cocaine history  Tobacco abuse -recommend cessation -needs lung cancer screening referral after discharge  CKD 3a -strict I/O -renally dose meds, avoid nephrotoxic meds  Acute anemia, POA -monitor -may need GI evaluation as an outpatient -transfuse for Hb <7 or hemodynamically significant bleeding  No family at bedside during rounds.  Best Practice (right click and "Reselect all SmartList Selections" daily)   Diet/type: NPO DVT prophylaxis: LMWH GI prophylaxis: PPI Lines: N/A Foley:  N/A Code Status:  full code Last date of multidisciplinary goals of care discussion []   Labs   CBC: Recent Labs  Lab 03/27/22 2211 03/27/22 2317 03/28/22 0050 03/28/22 0055 03/28/22 0516  WBC 7.0  --  5.3  --   --   NEUTROABS 4.4  --   --   --   --   HGB 10.7* 13.9 11.3* 10.9* 10.2*  HCT 32.8* 41.0 34.4* 32.0* 30.0*  MCV 95.9  --  93.0  --   --   PLT 221  --  250  --   --      Basic Metabolic Panel: Recent  Labs  Lab 03/27/22 2211 03/27/22 2317 03/28/22 0050 03/28/22 0055 03/28/22 0516 03/28/22 0557  NA 137 102* 136 138 140 139  K 4.5 2.8* 3.7 4.2 4.8 4.6  CL 107  --  107  --   --  111  CO2 24  --  21*  --   --  22  GLUCOSE 112*  --  99  --   --  84  BUN 43*  --  38*  --   --  34*  CREATININE 1.43*  --  1.25*  --   --  1.21*  CALCIUM 8.3*  --  8.2*  --   --  8.3*  MG  --   --  1.8  --   --  2.4    GFR: Estimated Creatinine Clearance: 30.3 mL/min (A) (by C-G formula based on SCr of 1.21 mg/dL (H)). Recent Labs  Lab 03/27/22 2211 03/28/22 0050  WBC 7.0 5.3    This patient  is critically ill with multiple organ system failure which requires frequent high complexity decision making, assessment, support, evaluation, and titration of therapies. This was completed through the application of advanced monitoring technologies and extensive interpretation of multiple databases. During this encounter critical care time was devoted to patient care services described in this note for 35 minutes.  Julian Hy, DO 03/28/22 9:41 AM  Pulmonary & Critical Care

## 2022-03-28 NOTE — Progress Notes (Signed)
Rounding Note    Patient Name: Cassidy Larson Date of Encounter: 03/28/2022  [place] Cardiologist: None New- Dr Angelena Form  Subjective   Patient alert on vent. Complains of throat discomfort. No chest pain or SOB  Inpatient Medications    Scheduled Meds:  aspirin  81 mg Oral Daily   atorvastatin  80 mg Per NG tube Daily   mouth rinse  15 mL Mouth Rinse Q2H   sodium chloride flush  3 mL Intravenous Q12H   ticagrelor  90 mg Per NG tube BID   Continuous Infusions:  sodium chloride 10 mL/hr at 03/28/22 0600   sodium chloride 50 mL/hr at 03/28/22 0600   sodium chloride     fentaNYL infusion INTRAVENOUS 25 mcg/hr (03/28/22 0600)   midazolam 1 mg/hr (03/28/22 0600)   PRN Meds: sodium chloride, acetaminophen, fentaNYL, ondansetron (ZOFRAN) IV, mouth rinse, sodium chloride flush   Vital Signs    Vitals:   03/28/22 0600 03/28/22 0615 03/28/22 0630 03/28/22 0645  BP: 105/64 (!) 147/81 (!) 146/91   Pulse: (!) 59 65 69 65  Resp: 16 16 19 16   Temp:      TempSrc:      SpO2: 99% 100% 100% 100%  Weight:      Height:        Intake/Output Summary (Last 24 hours) at 03/28/2022 0745 Last data filed at 03/28/2022 0600 Gross per 24 hour  Intake 430.81 ml  Output 0 ml  Net 430.81 ml      03/28/2022   12:00 AM 02/10/2019    1:50 PM 02/09/2019   11:17 AM  Last 3 Weights  Weight (lbs) 89 lb 15.2 oz 96 lb 96 lb 9.6 oz  Weight (kg) 40.8 kg 43.545 kg 43.817 kg      Telemetry    NSR with some runs of NSVT - Personally Reviewed  ECG    NSR with Junctional rhythm, persistent ST elevation anteriorly - Personally Reviewed  Physical Exam   GEN: small BF in No acute distress.  On vent Neck: No JVD Cardiac: RRR, no murmurs, rubs, or gallops.  Respiratory: Clear to auscultation bilaterally. GI: Soft, nontender, non-distended  MS: No edema; No deformity. Neuro:  Nonfocal, alert on vent Psych: Normal affect   Labs    High Sensitivity Troponin:   Recent Labs   Lab 03/27/22 2211 03/28/22 0050  TROPONINIHS 11 19,192*     Chemistry Recent Labs  Lab 03/27/22 2211 03/27/22 2317 03/28/22 0050 03/28/22 0055 03/28/22 0516 03/28/22 0557  NA 137   < > 136 138 140 139  K 4.5   < > 3.7 4.2 4.8 4.6  CL 107  --  107  --   --  111  CO2 24  --  21*  --   --  22  GLUCOSE 112*  --  99  --   --  84  BUN 43*  --  38*  --   --  34*  CREATININE 1.43*  --  1.25*  --   --  1.21*  CALCIUM 8.3*  --  8.2*  --   --  8.3*  MG  --   --  1.8  --   --  2.4  PROT 7.1  --   --   --   --   --   ALBUMIN 2.7*  --   --   --   --   --   AST 26  --   --   --   --   --  ALT 16  --   --   --   --   --   ALKPHOS 57  --   --   --   --   --   BILITOT 0.7  --   --   --   --   --   GFRNONAA 41*  --  48*  --   --  50*  ANIONGAP 6  --  8  --   --  6   < > = values in this interval not displayed.    Lipids  Recent Labs  Lab 03/27/22 2211  CHOL 165  TRIG 33  HDL 47  LDLCALC 111*  CHOLHDL 3.5    Hematology Recent Labs  Lab 03/27/22 2211 03/27/22 2317 03/28/22 0050 03/28/22 0055 03/28/22 0516  WBC 7.0  --  5.3  --   --   RBC 3.42*  --  3.70*  --   --   HGB 10.7*   < > 11.3* 10.9* 10.2*  HCT 32.8*   < > 34.4* 32.0* 30.0*  MCV 95.9  --  93.0  --   --   MCH 31.3  --  30.5  --   --   MCHC 32.6  --  32.8  --   --   RDW 14.7  --  14.5  --   --   PLT 221  --  250  --   --    < > = values in this interval not displayed.   Thyroid No results for input(s): "TSH", "FREET4" in the last 168 hours.  BNPNo results for input(s): "BNP", "PROBNP" in the last 168 hours.  DDimer No results for input(s): "DDIMER" in the last 168 hours.   Radiology    DG Abd 1 View  Result Date: 03/28/2022 CLINICAL DATA:  OG tube placement EXAM: ABDOMEN - 1 VIEW COMPARISON:  None Available. FINDINGS: Enteric tube terminates in the proximal gastric body. IMPRESSION: Enteric tube terminates in the proximal gastric body. Electronically Signed   By: Julian Hy M.D.   On: 03/28/2022  00:15   DG CHEST PORT 1 VIEW  Result Date: 03/28/2022 CLINICAL DATA:  ETT placement EXAM: PORTABLE CHEST 1 VIEW COMPARISON:  01/19/2022 FINDINGS: Endotracheal tube terminates 4 cm above the carina. Mild bibasilar opacities, likely atelectasis. No pleural effusion or pneumothorax. The heart normal in size.  Thoracic aortic atherosclerosis. Enteric tube courses into the proximal stomach. IMPRESSION: Endotracheal tube terminates 4 cm above the carina. Enteric tube courses into the proximal stomach. Electronically Signed   By: Julian Hy M.D.   On: 03/28/2022 00:14   CARDIAC CATHETERIZATION  Addendum Date: 03/27/2022     Prox RCA to Mid RCA lesion is 100% stenosed.   Mid Cx lesion is 80% stenosed.   Mid LAD lesion is 100% stenosed.   A drug-eluting stent was successfully placed using a SYNERGY XD 2.50X16.   Post intervention, there is a 0% residual stenosis. Acute anterior STEMI secondary to thrombotic occlusion of the mid LAD Successful PTCA/DES x 1 mid LAD Moderately severe mid Circumflex stenosis Dominant RCA with chronic occlusion of the mid RCA. The distal RCA branches fill from left to right collaterals. LVEDP=17 mmHg Pt intubated during case secondary to agitation. Recommendations: Will admit to the ICU. Will ask PCCM team to assist in ventilator management. Will continue Aggrastat infusion for 4 hours. Will give Brilinta 180 mg po x 1 once OG tube in place. Will then continue ASA/Brilinta for one year. It  does not appear that she has a true ASA allergy. Will start high intensity statin. Echo in am. No beta blocker given cocaine abuse. Consider PCI of the Circumflex but this lesion is not critical and it may be best to observe her compliance with medical therapy and cocaine abstinence before placing additional coronary stents.   Result Date: 03/27/2022   Prox RCA to Mid RCA lesion is 100% stenosed.   Mid Cx lesion is 80% stenosed.   Mid LAD lesion is 100% stenosed.   A drug-eluting stent was  successfully placed using a SYNERGY XD 2.50X16.   Post intervention, there is a 0% residual stenosis. Acute anterior STEMI secondary to thrombotic occlusion of the mid LAD Successful PTCA/DES x 1 mid LAD Moderately severe mid Circumflex stenosis Dominant RCA with chronic occlusion of the mid RCA. The distal RCA branches fill from left to right collaterals. LVEDP=17 mmHg Pt intubated during case secondary to agitation. Recommendations: Will admit to the ICU. Will ask PCCM team to assist in ventilator management. Will continue Aggrastat infusion for 4 hours. Will give Brilinta 180 mg po x 1 once OG tube in place. Will then continue ASA/Brilinta for one year. It does not appear that she has a true ASA allergy. Will start high intensity statin. Echo in am. No beta blocker given cocaine abuse. Consider PCI of the Circumflex but this lesion is not critical and it may be best to observe her compliance with medical therapy and cocaine abstinence before placing additional coronary stents.    Cardiac Studies   Coronary/Graft Acute MI Revascularization  LEFT HEART CATH AND CORONARY ANGIOGRAPHY   Conclusion      Prox RCA to Mid RCA lesion is 100% stenosed.   Mid Cx lesion is 80% stenosed.   Mid LAD lesion is 100% stenosed.   A drug-eluting stent was successfully placed using a SYNERGY XD 2.50X16.   Post intervention, there is a 0% residual stenosis.   Acute anterior STEMI secondary to thrombotic occlusion of the mid LAD Successful PTCA/DES x 1 mid LAD Moderately severe mid Circumflex stenosis Dominant RCA with chronic occlusion of the mid RCA. The distal RCA branches fill from left to right collaterals.  LVEDP=17 mmHg Pt intubated during case secondary to agitation.    Recommendations: Will admit to the ICU. Will ask PCCM team to assist in ventilator management. Will continue Aggrastat infusion for 4 hours. Will give Brilinta 180 mg po x 1 once OG tube in place. Will then continue ASA/Brilinta for one  year. It does not appear that she has a true ASA allergy. Will start high intensity statin. Echo in am. No beta blocker given cocaine abuse. Consider PCI of the Circumflex but this lesion is not critical and it may be best to observe her compliance with medical therapy and cocaine abstinence before placing additional coronary stents.    Coronary Diagrams  Diagnostic Dominance: Right  Intervention     Patient Profile     66 y.o. female with pmhx cocaine use, MJ use, tobacco use who presents to the ER with crushing chest pain that started 4 hours ago. Anterior STEMI  Assessment & Plan    Acute anterior STEMI. Patient with risk factors of cocaine and tobacco abuse, HLD. HTN. S/p emergent cardiac cath with acutely occluded mid LAD treated with DES. 80% stenosis in the mid LCx. 100% mid RCA with left to right collaterals- chronic. Now on ASA and Brilinta. Can DC Aggrastat. High dose statin. Will initiate Coreg and hydralazine  now for HTN. Anticipate she will be extubated this am. Will assess LV function by Echo. If EF down as expected will add ARB if renal function remains stable. Plan to treat residual disease medically for now. Could consider PCI of LCx at later time if patient compliant with medical therapy and follow up . Acute respiratory failure secondary to agitation requiring intubation. Now alert on vent and ABG looks good. Anticipate extubation today. Plans per CCM NSVT post reperfusion. Magnesium repleted. Start Coreg and monitor.  Urinary retention. 500 cc on bladder scan. Unable to pass I/O catheter . Voided some this am. Will monitor. If unable to void may need urology to see. Purewick in place. Polysubstance abuse. Will counsel on cessation.  HLD. Will initiate high dose statin. Goal LDL <70 CKD. Stage 3b. Creatinine at baseline.  HTN. Will initiate hydralazine and Coreg and monitor.  History of positive ANA   For questions or updates, please contact [place] Please consult [email or web address] for contact info under        Signed, Sherrine Salberg [place], MD  03/28/2022, 7:45 AM

## 2022-03-28 NOTE — Progress Notes (Signed)
[facility] Progress Note Patient Name: Melondy Blanchard DOB: 12/03/1955 MRN: [id number]   Date of Service  03/28/2022  HPI/Events of Note  Urinary retention - Bladder scan = 572 mL.   eICU Interventions  Plan: I/O Cath PRN.      Intervention Category Major Interventions: Other:  Lysle Dingwall 03/28/2022, 4:59 AM

## 2022-03-28 NOTE — Progress Notes (Signed)
  Echocardiogram 2D Echocardiogram has been performed.  Wynelle Link 03/28/2022, 3:40 PM

## 2022-03-28 NOTE — TOC Benefit Eligibility Note (Addendum)
Patient Teacher, English as a foreign language completed.    The patient is currently admitted and upon discharge could be taking Brilinta 90 mg.  The current 30 day co-pay is $4.00.   The patient is currently admitted and upon discharge could be taking Entresto 24-26mg .  Requires Prior Authorization  The patient is currently admitted and upon discharge could be taking Jardiance 10 mg.  Requires Prior Authorization  The patient is currently admitted and upon discharge could be taking Farxiga 10 mg.  Requires Prior Authorization  The patient is insured through [place] [place] Patient Advocate Specialist [place] Patient Advocate Team Direct Number: [phone]  Fax: [phone]

## 2022-03-28 NOTE — Consult Note (Signed)
NAME:  Cassidy Larson, MRN:  [id number], DOB:  02-Sep-1955, LOS: 1 ADMISSION DATE:  03/27/2022, CONSULTATION DATE:  03/27/22  REFERRING MD:  cardiology , CHIEF COMPLAINT:  chest pain    History of Present Illness:  66 yo woman admitted with stemi, taken to cath lab for PCI, stent placed.   Chest pain began around 6pm. L side/substernal.  No resolution with NTG.  Radiation to midback.    In ED, given ASA 324, heparin 4000U DES to mid LAD   Intubated during the procedure due to increased sedation requirements for agitation during procedure.  11pm 7.19/35/411 (RR 28, Tv  400, 100%) Pertinent  Medical History  Tobacco use Cocaine use Anxiety,  CKD HTN Lupus Pos ANA   Significant Hospital Events: Including procedures, antibiotic start and stop dates in addition to other pertinent events     Interim History / Subjective:    Objective   Blood pressure 127/85, pulse 85, temperature (!) 96.9 F (36.1 C), temperature source Axillary, resp. rate (!) 28, height 5\' 2"  (1.575 m), weight 40.8 kg, SpO2 100 %.    Vent Mode: PRVC FiO2 (%):  [100 %] 100 % Set Rate:  [28 bmp] 28 bmp Vt Set:  [400 mL] 400 mL PEEP:  [5 cmH20] 5 cmH20   Intake/Output Summary (Last 24 hours) at 03/28/2022 0010 Last data filed at 03/27/2022 2300 Gross per 24 hour  Intake --  Output 0 ml  Net 0 ml   Filed Weights   03/28/22 0000  Weight: 40.8 kg    Examination: General: Intubated , thin  HENT: NCAT  Lungs: CTAB Cardiovascular: RRR no mgr  Abdomen: nt, nd, nbs  Extremities: no edema  Neuro: intubated and sedated GU:   Resolved Hospital Problem list     Assessment & Plan:  S/p STEMI, PCI: intubated for airway protection, agitation.  A-a gradient actually elevated.   7.5/23/213 on repeat abg.  I have decreased her O2 to 50% from 100% and decreased RR from 28 to 20.   Repeat ABG 5am ON sedation now.   Goal extubation in AM.   PVCs - cont to monitor, ABG with low K however I doubt it is  accurate.  Kidney disease  Cr 1.43 (appears baseline) BUN elevated at 43 Hypoalb 2.7   Best Practice (right click and "Reselect all SmartList Selections" daily)   Diet/type: NPO DVT prophylaxis: other GI prophylaxis: PPI Lines: N/A Foley:  N/A Code Status:  full code Last date of multidisciplinary goals of care discussion []   Labs   CBC: Recent Labs  Lab 03/27/22 2211 03/27/22 2317  WBC 7.0  --   NEUTROABS 4.4  --   HGB 10.7* 13.9  HCT 32.8* 41.0  MCV 95.9  --   PLT 221  --     Basic Metabolic Panel: Recent Labs  Lab 03/27/22 2211 03/27/22 2317  NA 137 102*  K 4.5 2.8*  CL 107  --   CO2 24  --   GLUCOSE 112*  --   BUN 43*  --   CREATININE 1.43*  --   CALCIUM 8.3*  --    GFR: Estimated Creatinine Clearance: 25.6 mL/min (A) (by C-G formula based on SCr of 1.43 mg/dL (H)). Recent Labs  Lab 03/27/22 2211  WBC 7.0    Liver Function Tests: Recent Labs  Lab 03/27/22 2211  AST 26  ALT 16  ALKPHOS 57  BILITOT 0.7  PROT 7.1  ALBUMIN 2.7*   No results for input(s): "  LIPASE", "AMYLASE" in the last 168 hours. No results for input(s): "AMMONIA" in the last 168 hours.  ABG    Component Value Date/Time   PHART 7.190 (LL) 03/27/2022 2317   PCO2ART 35.5 03/27/2022 2317   PO2ART 411 (H) 03/27/2022 2317   HCO3 13.6 (L) 03/27/2022 2317   TCO2 15 (L) 03/27/2022 2317   ACIDBASEDEF 14.0 (H) 03/27/2022 2317   O2SAT 100 03/27/2022 2317     Coagulation Profile: Recent Labs  Lab 03/27/22 2211  INR 1.2    Cardiac Enzymes: No results for input(s): "CKTOTAL", "CKMB", "CKMBINDEX", "TROPONINI" in the last 168 hours.  HbA1C: Hgb A1c MFr Bld  Date/Time Value Ref Range Status  03/27/2022 10:11 PM 5.5 4.8 - 5.6 % Final    Comment:    (NOTE) Pre diabetes:          5.7%-6.4%  Diabetes:              >6.4%  Glycemic control for   <7.0% adults with diabetes     CBG: No results for input(s): "GLUCAP" in the last 168 hours.  Review of Systems:   Unable to  assess   Past Medical History:  She,  has a past medical history of Anxiety, CKD (chronic kidney disease) stage 4, GFR 15-29 ml/min ([place]), Cocaine abuse ([place]), Essential hypertension, Lupus ([place]), Polysubstance abuse ([place]), and Positive ANA (antinuclear antibody).   Surgical History:  No past surgical history on file.   Social History:   reports that she has been smoking cigarettes. She has been smoking an average of .5 packs per day. She has never used smokeless tobacco. She reports current drug use. Drugs: Marijuana and "Crack" cocaine. She reports that she does not drink alcohol.   Family History:  Her Family history is unknown by patient.   Allergies Allergies  Allergen Reactions   Aspirin Other (See Comments)    Makes sick on stomach    Latex Other (See Comments)    unspecified     Home Medications  Prior to Admission medications   Not on File     Critical care time: 45 min

## 2022-03-28 NOTE — Progress Notes (Signed)
[facility] ADULT ICU REPLACEMENT PROTOCOL   The patient does apply for the [facility] Adult ICU Electrolyte Replacment Protocol based on the criteria listed below:   1.Exclusion criteria: TCTS patients, ECMO patients, and Dialysis patients 2. Is GFR >/= 30 ml/min? Yes.    Patient's GFR today is 48 3. Is SCr </= 2? Yes.   Patient's SCr is 1.25 mg/dL 4. Did SCr increase >/= 0.5 in 24 hours? No. 5.Pt's weight >40kg  Yes.   6. Abnormal electrolyte(s):   Mg 1.8  7. Electrolytes replaced per protocol 8.  Call MD STAT for K+ </= 2.5, Phos </= 1, or Mag </= 1 Physician:  S. Rosie Fate R Ellamarie Naeve 03/28/2022 3:10 AM

## 2022-03-28 NOTE — Progress Notes (Signed)
[facility] Progress Note Patient Name: Simcha Farrington DOB: May 15, 1956 MRN: [id number]   Date of Service  03/28/2022  HPI/Events of Note  Multiple issues: 1. Request to review CXR and Abdominal film for ETT and gastric tube placement. ETT tip in satisfactory position 4 cm from the carina. Gastric tube terminates in satisfactory position in the stomach with tip and side port well below the GE junction. Already has a Fentanyl IV infusion and Versed IV infusion written for sedation. ABG pending.   eICU Interventions  Plan: OK to use gastric tube.  Ventilator orders: 100%/PRVC 28/TV 400/P 5.  Fentanyl 30 mcg IV bolus from bag Q 1 hour PRN pain or agitation.      Intervention Category Major Interventions: Respiratory failure - evaluation and management  Gracie Gupta [place] 03/28/2022, 12:48 AM

## 2022-03-29 ENCOUNTER — Other Ambulatory Visit: Payer: Self-pay

## 2022-03-29 ENCOUNTER — Encounter (HOSPITAL_COMMUNITY): Payer: Self-pay | Admitting: Cardiovascular Disease

## 2022-03-29 LAB — BASIC METABOLIC PANEL
Anion gap: 8 (ref 5–15)
BUN: 23 mg/dL (ref 8–23)
CO2: 21 mmol/L — ABNORMAL LOW (ref 22–32)
Calcium: 8.6 mg/dL — ABNORMAL LOW (ref 8.9–10.3)
Chloride: 108 mmol/L (ref 98–111)
Creatinine, Ser: 1.22 mg/dL — ABNORMAL HIGH (ref 0.44–1.00)
GFR, Estimated: 50 mL/min — ABNORMAL LOW (ref >60)
Glucose, Bld: 145 mg/dL — ABNORMAL HIGH (ref 70–99)
Potassium: 4.5 mmol/L (ref 3.5–5.1)
Sodium: 137 mmol/L (ref 135–145)

## 2022-03-29 LAB — MAGNESIUM: Magnesium: 2.3 mg/dL (ref 1.7–2.4)

## 2022-03-29 LAB — LIPOPROTEIN A (LPA): Lipoprotein (a): 204.9 nmol/L — ABNORMAL HIGH (ref <75.0)

## 2022-03-29 MED ORDER — MAGNESIUM SULFATE 2 GM/50ML IV SOLN: 2.0000 g | Freq: Once | INTRAVENOUS | Status: DC

## 2022-03-29 MED ORDER — ENSURE ENLIVE PO LIQD
237.0000 mL | Freq: Two times a day (BID) | ORAL | Status: DC
  Administered 2022-03-30 – 2022-04-02 (×3): 237 mL via ORAL

## 2022-03-29 MED ORDER — ISOSORBIDE MONONITRATE ER 30 MG PO TB24
15.0000 mg | ORAL_TABLET | Freq: Every day | ORAL | Status: DC
  Administered 2022-03-29 – 2022-04-02 (×5): 15 mg via ORAL
  Filled 2022-03-29 (×5): qty 1

## 2022-03-29 MED ORDER — CALCIUM GLUCONATE-NACL 1-0.675 GM/50ML-% IV SOLN: 1.0000 g | Freq: Once | INTRAVENOUS | Status: DC

## 2022-03-29 MED ORDER — BANATROL TF EN LIQD
60.0000 mL | Freq: Two times a day (BID) | ENTERAL | Status: DC
  Administered 2022-03-29 – 2022-03-31 (×5): 60 mL via ORAL
  Filled 2022-03-29 (×12): qty 60

## 2022-03-29 NOTE — Evaluation (Signed)
Physical Therapy Evaluation Patient Details Name: Preston Weill MRN: [id number] DOB: 06/05/1956 Today's Date: 03/29/2022  History of Present Illness  66 yo admitted 03/27/22 after found on street by EMS with crushing chest pain, STEMI taken to cath lab with PCI and stent placed. Intubated in cath lab, extubated 10/12. PMhx: cocaine and tobacco abuse, anxiety, CKD, HTN, Lupus   Clinical Impression  Patient evaluated by Physical Therapy with no further acute PT needs identified. All education has been completed and the patient has no further questions. To defer further activity to mobility specialist and cardiac rehab. See below for any follow-up Physical Therapy or equipment needs.     Mobility vitals:  Pre 131/79, Post 147/93 SpO2 >95% RA     Recommendations for follow up therapy are one component of a multi-disciplinary discharge planning process, led by the attending physician.  Recommendations may be updated based on patient status, additional functional criteria and insurance authorization.  Follow Up Recommendations No PT follow up      Assistance Recommended at Discharge PRN  Patient can return home with the following  A little help with bathing/dressing/bathroom    Equipment Recommendations    Recommendations for Other Services       Functional Status Assessment Patient has not had a recent decline in their functional status     Precautions / Restrictions Precautions Precautions: Fall;Other (comment) (R wrist cath) Restrictions Weight Bearing Restrictions: No      Mobility  Bed Mobility Overal bed mobility: Needs Assistance Bed Mobility: Supine to Sit, Sit to Supine     Supine to sit: Supervision, HOB elevated Sit to supine: Supervision, HOB elevated   General bed mobility comments: verbal cues for R wrist precautions    Transfers Overall transfer level: Needs assistance Equipment used: None Transfers: Sit to/from Stand Sit to Stand: Supervision            General transfer comment: For line management and cuing for UE positioning, sit to stand from both bed and regular height toilet    Ambulation/Gait Ambulation/Gait assistance: Min guard Gait Distance (Feet): 400 Feet Assistive device: None Gait Pattern/deviations: Decreased stride length, Step-to pattern Gait velocity: decreased     General Gait Details: slow guarded gait, pt reports this is baseline functioning  Stairs            Wheelchair Mobility    Modified Rankin (Stroke Patients Only)       Balance Overall balance assessment: Needs assistance Sitting-balance support: No upper extremity supported Sitting balance-Leahy Scale: Good Sitting balance - Comments: pt performs dynamic sitting balance with weight shifting towards both head and foot of bed   Standing balance support: No upper extremity supported Standing balance-Leahy Scale: Fair Standing balance comment: pt able to intermittently reach out of BOS with no assitive device and no steadying necessary to prevent LOB                             Pertinent Vitals/Pain Pain Assessment Faces Pain Scale: Hurts little more Pain Location: sternal scar + B LE during ambulation Pain Descriptors / Indicators: Aching, Nagging Pain Intervention(s): Monitored during session, Limited activity within patient's tolerance    Home Living Family/patient expects to be discharged to:: Private residence Living Arrangements: Alone Available Help at Discharge: Other (Comment) Type of Home: Apartment Home Access: Level entry       Home Layout: One level Home Equipment: None      Prior Function  Prior Level of Function : Independent/Modified Independent                     Hand Dominance   Dominant Hand: Left    Extremity/Trunk Assessment   Upper Extremity Assessment Upper Extremity Assessment: Overall WFL for tasks assessed    Lower Extremity Assessment Lower Extremity Assessment:  Overall WFL for tasks assessed    Cervical / Trunk Assessment Cervical / Trunk Assessment: Normal  Communication   Communication: No difficulties  Cognition Arousal/Alertness: Awake/alert Behavior During Therapy: WFL for tasks assessed/performed Overall Cognitive Status: Within Functional Limits for tasks assessed                                 General Comments: Pt AOx4, able to recall events that have occurred in past week        General Comments General comments (skin integrity, edema, etc.): Pt performed mobility without supplemental oxygen, Sp02 > 95% when pleth reliable    Exercises     Assessment/Plan    PT Assessment Patient does not need any further PT services  PT Problem List         PT Treatment Interventions      PT Goals (Current goals can be found in the Care Plan section)  Acute Rehab PT Goals Patient Stated Goal: to get back to feeling better PT Goal Formulation: With patient Time For Goal Achievement: 04/12/22 Potential to Achieve Goals: Good    Frequency       Co-evaluation               AM-PAC PT "6 Clicks" Mobility  Outcome Measure Help needed turning from your back to your side while in a flat bed without using bedrails?: None Help needed moving from lying on your back to sitting on the side of a flat bed without using bedrails?: None Help needed moving to and from a bed to a chair (including a wheelchair)?: A Little Help needed standing up from a chair using your arms (e.g., wheelchair or bedside chair)?: None Help needed to walk in hospital room?: A Little Help needed climbing 3-5 steps with a railing? : A Little 6 Click Score: 21    End of Session Equipment Utilized During Treatment: Gait belt Activity Tolerance: Patient tolerated treatment well Patient left: in bed;with call bell/phone within reach Nurse Communication: Mobility status;Other (comment) (O2 status)      Time: 5397-6734 PT Time Calculation (min)  (ACUTE ONLY): 27 min   Charges:   PT Evaluation $PT Eval Moderate Complexity: 1 Mod         [address] Ludwig, SPT   [place] Latrish Mogel 03/29/2022, 12:26 PM

## 2022-03-29 NOTE — Progress Notes (Signed)
PT Cancellation Note  Patient Details Name: Cassidy Larson MRN: [id number] DOB: [birth year]   Cancelled Treatment:    Reason Eval/Treat Not Completed: Other (comment) (pt currently with cardiac rehab, will reattempt)   [name] 03/29/2022, 8:40 AM [place] Office: [phone]

## 2022-03-29 NOTE — Plan of Care (Signed)
  Problem: Education: Goal: Knowledge of General Education information will improve Description: Including pain rating scale, medication(s)/side effects and non-pharmacologic comfort measures Outcome: Not Progressing   Problem: Health Behavior/Discharge Planning: Goal: Ability to manage health-related needs will improve Outcome: Progressing   Problem: Clinical Measurements: Goal: Ability to maintain clinical measurements within normal limits will improve Outcome: Progressing Goal: Will remain free from infection Outcome: Progressing Goal: Diagnostic test results will improve Outcome: Progressing Goal: Respiratory complications will improve Outcome: Progressing Goal: Cardiovascular complication will be avoided Outcome: Progressing

## 2022-03-29 NOTE — Progress Notes (Signed)
CARDIAC REHAB PHASE I   PRE:  Rate/Rhythm: 76 SR   BP:  Sitting: 121/93      SaO2: 99 RA   MODE:  Ambulation: 80 ft   POST:  Rate/Rhythm: 85 SR   BP:  Sitting: 122/99      SaO2: 99 RA   Pt ambulated in hal with minimal assist. Pt feels weak but maintaining steady gait. C/o of mild chest soreness and slight sob which resolved quickly once she sat in chair. In chair with call bell and bedside table in reach. Post MI/stent education including site care, exercise guidelines, risk factors, smoking and drug use cessation, restrictions, MI booklet, antiplatelet therapy importance and CRP2 reviewed. All questions and concerns addressed. Will refer to [facility] for CRP2. Will continue to follow.    0800-0900  Vanessa Barbara, RN BSN 03/29/2022 8:59 AM

## 2022-03-29 NOTE — Progress Notes (Signed)
Initial Nutrition Assessment  DOCUMENTATION CODES:   Non-severe (moderate) malnutrition in context of social or environmental circumstances  INTERVENTION:  - Add Ensure Enlive po BID, each supplement provides 350 kcal and 20 grams of protein.  - Add Banatrol BID  NUTRITION DIAGNOSIS:   Moderate Malnutrition related to social / environmental circumstances as evidenced by moderate fat depletion, moderate muscle depletion.  GOAL:   Patient will meet greater than or equal to 90% of their needs  MONITOR:   PO intake, Supplement acceptance  REASON FOR ASSESSMENT:   Malnutrition Screening Tool    ASSESSMENT:   66 y.o. female admits related to chest pain. PMH includes: anxiety, CKD, HTN, Lupus, polysubstance abuse. Pt is currently receiving medical management for s/p STEMI.  Meds reviewed. Labs reviewed.   The pt reports that she has not been eating very much since admission because she has no appetite. Pt reports that she was eating well prior to getting sick. Pt states that she is not sure how long it has been that she has had a poor appetite. Wts appear stable per record, no significant wt loss. Pt does report that she has been experiencing some diarrhea. She states that she would be open to trying Ensure BID and Banatrol BID. RD will continue to monitor PO intakes.   NUTRITION - FOCUSED PHYSICAL EXAM:  Flowsheet Row Most Recent Value  Orbital Region Moderate depletion  Upper Arm Region Mild depletion  Thoracic and Lumbar Region Unable to assess  Buccal Region Moderate depletion  Temple Region Moderate depletion  Clavicle Bone Region Moderate depletion  Clavicle and Acromion Bone Region Moderate depletion  Scapular Bone Region Unable to assess  Dorsal Hand Moderate depletion  Patellar Region Moderate depletion  Anterior Thigh Region Moderate depletion  Posterior Calf Region Moderate depletion  Edema (RD Assessment) None  Hair Reviewed  Eyes Reviewed  Mouth Reviewed   Skin Reviewed  Nails Reviewed       Diet Order:   Diet Order             Diet Heart Room service appropriate? Yes; Fluid consistency: Thin  Diet effective now                   EDUCATION NEEDS:   Not appropriate for education at this time  Skin:  Skin Assessment: Reviewed RN Assessment  Last BM:  03/27/22  Height:   Ht Readings from Last 1 Encounters:  03/28/22 5\' 2"  (1.575 m)    Weight:   Wt Readings from Last 1 Encounters:  03/29/22 39.1 kg    Ideal Body Weight:  50 kg  BMI:  Body mass index is 15.77 kg/m.  Estimated Nutritional Needs:   Kcal:  9811-9147 kcals  Protein:  70-80 gm  Fluid:  8295-6213 mL  Thalia Bloodgood, RD, LDN, CNSC

## 2022-03-29 NOTE — Progress Notes (Signed)
Rounding Note    Patient Name: Cassidy Larson Date of Encounter: 03/29/2022  [place] Cardiologist: None New- Dr Angelena Form  Subjective   Patient denies any chest pain. States she is still SOB if off oxygen. Reports her living situation is poor.   Inpatient Medications    Scheduled Meds:  aspirin  81 mg Oral Daily   atorvastatin  80 mg Oral Daily   carvedilol  6.25 mg Oral BID WC   Chlorhexidine Gluconate Cloth  6 each Topical Daily   enoxaparin (LOVENOX) injection  30 mg Subcutaneous Q24H   hydrALAZINE  25 mg Oral Q8H   isosorbide mononitrate  15 mg Oral Daily   sodium chloride flush  3 mL Intravenous Q12H   ticagrelor  90 mg Oral BID   Continuous Infusions:  sodium chloride Stopped (03/28/22 1057)   sodium chloride     PRN Meds: sodium chloride, acetaminophen, haloperidol lactate, ondansetron (ZOFRAN) IV, mouth rinse, sodium chloride flush   Vital Signs    Vitals:   03/29/22 0500 03/29/22 0600 03/29/22 0619 03/29/22 0728  BP: 123/67 129/65    Pulse: 65 61    Resp: 14 12    Temp:    98.6 F (37 C)  TempSrc:    Oral  SpO2: 100% 99%    Weight:   39.1 kg   Height:        Intake/Output Summary (Last 24 hours) at 03/29/2022 0809 Last data filed at 03/28/2022 2225 Gross per 24 hour  Intake 197.38 ml  Output 550 ml  Net -352.62 ml       03/29/2022    6:19 AM 03/28/2022   12:00 AM 02/10/2019    1:50 PM  Last 3 Weights  Weight (lbs) 86 lb 3.2 oz 89 lb 15.2 oz 96 lb  Weight (kg) 39.1 kg 40.8 kg 43.545 kg      Telemetry    NSR with some runs of NSVT- less than before.  - Personally Reviewed  ECG    Will repeat today  Physical Exam   GEN: small BF in No acute distress.   Neck: No JVD Cardiac: RRR, no murmurs, rubs, or gallops.  Respiratory: Clear to auscultation bilaterally. GI: Soft, nontender, non-distended  MS: No edema; No deformity. Neuro:  Nonfocal, alert  Psych: Normal affect   Labs    High Sensitivity Troponin:    Recent Labs  Lab 03/27/22 2211 03/28/22 0050  TROPONINIHS 11 19,192*      Chemistry Recent Labs  Lab 03/27/22 2211 03/27/22 2317 03/28/22 0050 03/28/22 0055 03/28/22 0516 03/28/22 0557 03/29/22 0632  NA 137   < > 136   < > 140 139 137  K 4.5   < > 3.7   < > 4.8 4.6 4.5  CL 107  --  107  --   --  111 108  CO2 24  --  21*  --   --  22 21*  GLUCOSE 112*  --  99  --   --  84 145*  BUN 43*  --  38*  --   --  34* 23  CREATININE 1.43*  --  1.25*  --   --  1.21* 1.22*  CALCIUM 8.3*  --  8.2*  --   --  8.3* 8.6*  MG  --   --  1.8  --   --  2.4 2.3  PROT 7.1  --   --   --   --   --   --  ALBUMIN 2.7*  --   --   --   --   --   --   AST 26  --   --   --   --   --   --   ALT 16  --   --   --   --   --   --   ALKPHOS 57  --   --   --   --   --   --   BILITOT 0.7  --   --   --   --   --   --   GFRNONAA 41*  --  48*  --   --  50* 50*  ANIONGAP 6  --  8  --   --  6 8   < > = values in this interval not displayed.     Lipids  Recent Labs  Lab 03/27/22 2211  CHOL 165  TRIG 33  HDL 47  LDLCALC 111*  CHOLHDL 3.5     Hematology Recent Labs  Lab 03/27/22 2211 03/27/22 2317 03/28/22 0050 03/28/22 0055 03/28/22 0516  WBC 7.0  --  5.3  --   --   RBC 3.42*  --  3.70*  --   --   HGB 10.7*   < > 11.3* 10.9* 10.2*  HCT 32.8*   < > 34.4* 32.0* 30.0*  MCV 95.9  --  93.0  --   --   MCH 31.3  --  30.5  --   --   MCHC 32.6  --  32.8  --   --   RDW 14.7  --  14.5  --   --   PLT 221  --  250  --   --    < > = values in this interval not displayed.    Thyroid No results for input(s): "TSH", "FREET4" in the last 168 hours.  BNPNo results for input(s): "BNP", "PROBNP" in the last 168 hours.  DDimer No results for input(s): "DDIMER" in the last 168 hours.   Radiology    ECHOCARDIOGRAM COMPLETE  Result Date: 03/28/2022    ECHOCARDIOGRAM REPORT   Patient Name:   Coree Riester Date of Exam: 03/28/2022 Medical Rec #:  [id number]        Height:       62.0 in Accession #:     [id number]       Weight:       89.9 lb Date of Birth:  Jul 24, 1955        BSA:          1.361 m Patient Age:    4 years         BP:           125/67 mmHg Patient Gender: F                HR:           59 bpm. Exam Location:  Inpatient Procedure: 2D Echo, Cardiac Doppler and Color Doppler Indications:    CAD Native Vessel I25.10  History:        Patient has no prior history of Echocardiogram examinations. CAD                 and STEMI; Risk Factors:Hypertension.  Sonographer:    Greer Pickerel Referring Phys: Burnell Blanks  Sonographer Comments: No apical window, no parasternal window, suboptimal subcostal window, Technically challenging study due to limited acoustic windows and Technically difficult  study due to poor echo windows. Image acquisition challenging due to respiratory motion and Image acquisition challenging due to COPD. IMPRESSIONS  1. Images are not interpretable.  2. Left ventricular endocardial border not optimally defined to evaluate regional wall motion. Left ventricular diastolic function could not be evaluated.  3. Right ventricular systolic function was not well visualized. The right ventricular size is not well visualized.  4. The mitral valve was not assessed. No evidence of mitral valve regurgitation. No evidence of mitral stenosis. Severe mitral annular calcification.  5. The aortic valve was not assessed. Aortic valve regurgitation is not visualized. No aortic stenosis is present.  6. The inferior vena cava is dilated in size with <50% respiratory variability, suggesting right atrial pressure of 15 mmHg. FINDINGS  Left Ventricle: Left ventricular endocardial border not optimally defined to evaluate regional wall motion. There is no left ventricular hypertrophy. Left ventricular diastolic function could not be evaluated. Right Ventricle: The right ventricular size is not well visualized. Right vetricular wall thickness was not assessed. Right ventricular systolic function was not well  visualized. Left Atrium: Left atrial size was not assessed. Right Atrium: Right atrial size was not assessed. Pericardium: The pericardium was not assessed. Mitral Valve: The mitral valve was not assessed. Severe mitral annular calcification. No evidence of mitral valve regurgitation. No evidence of mitral valve stenosis. Tricuspid Valve: The tricuspid valve is not assessed. Tricuspid valve regurgitation is not demonstrated. No evidence of tricuspid stenosis. Aortic Valve: The aortic valve was not assessed. Aortic valve regurgitation is not visualized. No aortic stenosis is present. Pulmonic Valve: The pulmonic valve was not assessed. Pulmonic valve regurgitation is not visualized. No evidence of pulmonic stenosis. Aorta: The aortic root was not well visualized. Venous: The inferior vena cava is dilated in size with less than 50% respiratory variability, suggesting right atrial pressure of 15 mmHg. IAS/Shunts: No atrial level shunt detected by color flow Doppler. Skeet Latch MD Electronically signed by Skeet Latch MD Signature Date/Time: 03/28/2022/7:28:05 PM    Final    DG Abd 1 View  Result Date: 03/28/2022 CLINICAL DATA:  OG tube placement EXAM: ABDOMEN - 1 VIEW COMPARISON:  None Available. FINDINGS: Enteric tube terminates in the proximal gastric body. IMPRESSION: Enteric tube terminates in the proximal gastric body. Electronically Signed   By: Julian Hy M.D.   On: 03/28/2022 00:15   DG CHEST PORT 1 VIEW  Result Date: 03/28/2022 CLINICAL DATA:  ETT placement EXAM: PORTABLE CHEST 1 VIEW COMPARISON:  01/19/2022 FINDINGS: Endotracheal tube terminates 4 cm above the carina. Mild bibasilar opacities, likely atelectasis. No pleural effusion or pneumothorax. The heart normal in size.  Thoracic aortic atherosclerosis. Enteric tube courses into the proximal stomach. IMPRESSION: Endotracheal tube terminates 4 cm above the carina. Enteric tube courses into the proximal stomach. Electronically  Signed   By: Julian Hy M.D.   On: 03/28/2022 00:14   CARDIAC CATHETERIZATION  Addendum Date: 03/27/2022     Prox RCA to Mid RCA lesion is 100% stenosed.   Mid Cx lesion is 80% stenosed.   Mid LAD lesion is 100% stenosed.   A drug-eluting stent was successfully placed using a SYNERGY XD 2.50X16.   Post intervention, there is a 0% residual stenosis. Acute anterior STEMI secondary to thrombotic occlusion of the mid LAD Successful PTCA/DES x 1 mid LAD Moderately severe mid Circumflex stenosis Dominant RCA with chronic occlusion of the mid RCA. The distal RCA branches fill from left to right collaterals. LVEDP=17 mmHg Pt intubated during  case secondary to agitation. Recommendations: Will admit to the ICU. Will ask PCCM team to assist in ventilator management. Will continue Aggrastat infusion for 4 hours. Will give Brilinta 180 mg po x 1 once OG tube in place. Will then continue ASA/Brilinta for one year. It does not appear that she has a true ASA allergy. Will start high intensity statin. Echo in am. No beta blocker given cocaine abuse. Consider PCI of the Circumflex but this lesion is not critical and it may be best to observe her compliance with medical therapy and cocaine abstinence before placing additional coronary stents.   Result Date: 03/27/2022   Prox RCA to Mid RCA lesion is 100% stenosed.   Mid Cx lesion is 80% stenosed.   Mid LAD lesion is 100% stenosed.   A drug-eluting stent was successfully placed using a SYNERGY XD 2.50X16.   Post intervention, there is a 0% residual stenosis. Acute anterior STEMI secondary to thrombotic occlusion of the mid LAD Successful PTCA/DES x 1 mid LAD Moderately severe mid Circumflex stenosis Dominant RCA with chronic occlusion of the mid RCA. The distal RCA branches fill from left to right collaterals. LVEDP=17 mmHg Pt intubated during case secondary to agitation. Recommendations: Will admit to the ICU. Will ask PCCM team to assist in ventilator management. Will  continue Aggrastat infusion for 4 hours. Will give Brilinta 180 mg po x 1 once OG tube in place. Will then continue ASA/Brilinta for one year. It does not appear that she has a true ASA allergy. Will start high intensity statin. Echo in am. No beta blocker given cocaine abuse. Consider PCI of the Circumflex but this lesion is not critical and it may be best to observe her compliance with medical therapy and cocaine abstinence before placing additional coronary stents.    Cardiac Studies   Coronary/Graft Acute MI Revascularization  LEFT HEART CATH AND CORONARY ANGIOGRAPHY   Conclusion      Prox RCA to Mid RCA lesion is 100% stenosed.   Mid Cx lesion is 80% stenosed.   Mid LAD lesion is 100% stenosed.   A drug-eluting stent was successfully placed using a SYNERGY XD 2.50X16.   Post intervention, there is a 0% residual stenosis.   Acute anterior STEMI secondary to thrombotic occlusion of the mid LAD Successful PTCA/DES x 1 mid LAD Moderately severe mid Circumflex stenosis Dominant RCA with chronic occlusion of the mid RCA. The distal RCA branches fill from left to right collaterals.  LVEDP=17 mmHg Pt intubated during case secondary to agitation.    Recommendations: Will admit to the ICU. Will ask PCCM team to assist in ventilator management. Will continue Aggrastat infusion for 4 hours. Will give Brilinta 180 mg po x 1 once OG tube in place. Will then continue ASA/Brilinta for one year. It does not appear that she has a true ASA allergy. Will start high intensity statin. Echo in am. No beta blocker given cocaine abuse. Consider PCI of the Circumflex but this lesion is not critical and it may be best to observe her compliance with medical therapy and cocaine abstinence before placing additional coronary stents.    Coronary Diagrams  Diagnostic Dominance: Right  Intervention     Patient Profile     66 y.o. female with pmhx cocaine use, MJ use, tobacco use who presents to the ER with  crushing chest pain that started 4 hours ago. Anterior STEMI  Assessment & Plan    Acute anterior STEMI. Patient with risk factors of cocaine and  tobacco abuse, HLD. HTN. S/p emergent cardiac cath with acutely occluded mid LAD treated with DES. 80% stenosis in the mid LCx. 100% mid RCA with left to right collaterals- chronic. Now on ASA and Brilinta.  High dose statin. Will initiate Coreg and hydralazine now for HTN. Will add low dose nitrates. Unable to assess LV function by Echo yesterday but exam done with defibrillator pads in place. Will remove pads and repeat Echo today. May not be a great candidate for ACEi/ARB,ARNI or aldactone with low Creatinine clearance.  Plan to treat residual disease medically for now. Could consider PCI of LCx at later time if patient compliant with medical therapy and follow up . Acute respiratory failure secondary to agitation requiring intubation. Now alert and extubated. Wean oxygen as tolerated.  NSVT post reperfusion. Magnesium repleted. On Coreg. Will monitor.  Urinary retention. 500 cc on bladder scan. Unable to pass I/O catheter . Patient able to void on own now.  Polysubstance abuse. Counseled on importance of cessation.  HLD. Will initiate high dose statin. Goal LDL <70 CKD. Stage 3b. Creatinine at baseline.  HTN. improved History of positive ANA Poor social situation. Will ask case management to see. Has Medicaid so medication is not an issue.   Will transfer to telemetry today.   For questions or updates, please contact [place] Please consult [email or web address] for contact info under        Signed, Rishard Delange [place], MD  03/29/2022, 8:09 AM

## 2022-03-29 NOTE — Progress Notes (Signed)
Patient transferred from [facility] at 1535hrs. Oriented to unit and plan of care for shift, patient verbalized understanding.
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
## 2022-03-30 ENCOUNTER — Inpatient Hospital Stay (HOSPITAL_COMMUNITY): Payer: Medicaid Other

## 2022-03-30 ENCOUNTER — Encounter (HOSPITAL_COMMUNITY): Payer: Self-pay | Admitting: Cardiovascular Disease

## 2022-03-30 DIAGNOSIS — I2102 ST elevation (STEMI) myocardial infarction involving left anterior descending coronary artery: Secondary | ICD-10-CM

## 2022-03-30 DIAGNOSIS — Z95820 Peripheral vascular angioplasty status with implants and grafts: Secondary | ICD-10-CM

## 2022-03-30 DIAGNOSIS — E785 Hyperlipidemia, unspecified: Secondary | ICD-10-CM | POA: Diagnosis present

## 2022-03-30 DIAGNOSIS — E440 Moderate protein-calorie malnutrition: Secondary | ICD-10-CM | POA: Insufficient documentation

## 2022-03-30 DIAGNOSIS — I2510 Atherosclerotic heart disease of native coronary artery without angina pectoris: Secondary | ICD-10-CM | POA: Diagnosis present

## 2022-03-30 HISTORY — DX: Atherosclerotic heart disease of native coronary artery without angina pectoris: I25.10

## 2022-03-30 HISTORY — DX: Peripheral vascular angioplasty status with implants and grafts: Z95.820

## 2022-03-30 HISTORY — DX: Hyperlipidemia, unspecified: E78.5

## 2022-03-30 LAB — ECHOCARDIOGRAM COMPLETE
AR max vel: 1.34 cm2
AV Area VTI: 1.33 cm2
AV Area mean vel: 1.26 cm2
AV Mean grad: 5 mmHg
AV Peak grad: 8.4 mmHg
Ao pk vel: 1.45 m/s
Area-P 1/2: 4.49 cm2
Height: 62 in
S' Lateral: 3 cm
Weight: 1379.2 oz

## 2022-03-30 MED ORDER — TICAGRELOR 90 MG PO TABS: 90.0000 mg | ORAL_TABLET | Freq: Two times a day (BID) | ORAL | 11 refills | Status: DC

## 2022-03-30 MED ORDER — ATORVASTATIN CALCIUM 80 MG PO TABS: 80.0000 mg | ORAL_TABLET | Freq: Every day | ORAL | 6 refills | Status: DC

## 2022-03-30 MED ORDER — HYDRALAZINE HCL 25 MG PO TABS: 25.0000 mg | ORAL_TABLET | Freq: Three times a day (TID) | ORAL | 6 refills | Status: DC

## 2022-03-30 MED ORDER — CARVEDILOL 6.25 MG PO TABS: 6.2500 mg | ORAL_TABLET | Freq: Two times a day (BID) | ORAL | 6 refills | Status: DC

## 2022-03-30 MED ORDER — ASPIRIN 81 MG PO CHEW: 81.0000 mg | CHEWABLE_TABLET | Freq: Every day | ORAL | 11 refills | Status: DC

## 2022-03-30 MED ORDER — NITROGLYCERIN 0.4 MG SL SUBL: 0.4000 mg | SUBLINGUAL_TABLET | SUBLINGUAL | Status: DC | PRN

## 2022-03-30 MED ORDER — ISOSORBIDE MONONITRATE ER 30 MG PO TB24: 15.0000 mg | ORAL_TABLET | Freq: Every day | ORAL | 6 refills | Status: DC

## 2022-03-30 NOTE — Progress Notes (Signed)
Rounding Note    Patient Name: Cassidy Larson Date of Encounter: 03/30/2022  [place] Cardiologist: None   Subjective   NAEO. No chest pain. Reports poor appetite. Off oxygen.  Inpatient Medications    Scheduled Meds:  aspirin  81 mg Oral Daily   atorvastatin  80 mg Oral Daily   carvedilol  6.25 mg Oral BID WC   enoxaparin (LOVENOX) injection  30 mg Subcutaneous Q24H   feeding supplement  237 mL Oral BID BM   fiber supplement (BANATROL TF)  60 mL Oral BID   hydrALAZINE  25 mg Oral Q8H   isosorbide mononitrate  15 mg Oral Daily   sodium chloride flush  3 mL Intravenous Q12H   ticagrelor  90 mg Oral BID   Continuous Infusions:  sodium chloride Stopped (03/28/22 1057)   sodium chloride     PRN Meds: sodium chloride, acetaminophen, haloperidol lactate, ondansetron (ZOFRAN) IV, mouth rinse, sodium chloride flush   Vital Signs    Vitals:   03/29/22 1950 03/30/22 0003 03/30/22 0440 03/30/22 0801  BP: 121/69 113/75 (!) 132/95 132/66  Pulse: 70 63 74 67  Resp: 16 16 18 18   Temp: 98.5 F (36.9 C) 98.7 F (37.1 C) 98.9 F (37.2 C) 98.7 F (37.1 C)  TempSrc: Oral Oral Oral Oral  SpO2: 100% 100% 100% 99%  Weight:      Height:        Intake/Output Summary (Last 24 hours) at 03/30/2022 4332 Last data filed at 03/30/2022 9518 Gross per 24 hour  Intake 360 ml  Output 201 ml  Net 159 ml      03/29/2022    6:19 AM 03/28/2022   12:00 AM 02/10/2019    1:50 PM  Last 3 Weights  Weight (lbs) 86 lb 3.2 oz 89 lb 15.2 oz 96 lb  Weight (kg) 39.1 kg 40.8 kg 43.545 kg      Telemetry    sinus - Personally Reviewed  ECG    Personally Reviewed  Physical Exam   GEN: No acute distress.  Chronically ill appearing. Neck: No JVD Cardiac: RRR, no murmurs, rubs, or gallops.  Respiratory: Clear to auscultation bilaterally. GI: Soft, nontender, non-distended  MS: No edema; No deformity. Neuro:  Nonfocal  Psych: Normal affect   Labs    High Sensitivity  Troponin:   Recent Labs  Lab 03/27/22 2211 03/28/22 0050  TROPONINIHS 11 19,192*     Chemistry Recent Labs  Lab 03/27/22 2211 03/27/22 2317 03/28/22 0050 03/28/22 0055 03/28/22 0516 03/28/22 0557 03/29/22 0632  NA 137   < > 136   < > 140 139 137  K 4.5   < > 3.7   < > 4.8 4.6 4.5  CL 107  --  107  --   --  111 108  CO2 24  --  21*  --   --  22 21*  GLUCOSE 112*  --  99  --   --  84 145*  BUN 43*  --  38*  --   --  34* 23  CREATININE 1.43*  --  1.25*  --   --  1.21* 1.22*  CALCIUM 8.3*  --  8.2*  --   --  8.3* 8.6*  MG  --   --  1.8  --   --  2.4 2.3  PROT 7.1  --   --   --   --   --   --   ALBUMIN 2.7*  --   --   --   --   --   --  AST 26  --   --   --   --   --   --   ALT 16  --   --   --   --   --   --   ALKPHOS 57  --   --   --   --   --   --   BILITOT 0.7  --   --   --   --   --   --   GFRNONAA 41*  --  48*  --   --  50* 50*  ANIONGAP 6  --  8  --   --  6 8   < > = values in this interval not displayed.    Lipids  Recent Labs  Lab 03/27/22 2211  CHOL 165  TRIG 33  HDL 47  LDLCALC 111*  CHOLHDL 3.5    Hematology Recent Labs  Lab 03/27/22 2211 03/27/22 2317 03/28/22 0050 03/28/22 0055 03/28/22 0516  WBC 7.0  --  5.3  --   --   RBC 3.42*  --  3.70*  --   --   HGB 10.7*   < > 11.3* 10.9* 10.2*  HCT 32.8*   < > 34.4* 32.0* 30.0*  MCV 95.9  --  93.0  --   --   MCH 31.3  --  30.5  --   --   MCHC 32.6  --  32.8  --   --   RDW 14.7  --  14.5  --   --   PLT 221  --  250  --   --    < > = values in this interval not displayed.   Thyroid No results for input(s): "TSH", "FREET4" in the last 168 hours.  BNPNo results for input(s): "BNP", "PROBNP" in the last 168 hours.  DDimer No results for input(s): "DDIMER" in the last 168 hours.   Radiology    ECHOCARDIOGRAM COMPLETE  Result Date: 03/28/2022    ECHOCARDIOGRAM REPORT   Patient Name:   Cassidy Larson Date of Exam: 03/28/2022 Medical Rec #:  [id number]        Height:       62.0 in Accession #:     [id number]       Weight:       89.9 lb Date of Birth:  December 31, 1955        BSA:          1.361 m Patient Age:    66 years         BP:           125/67 mmHg Patient Gender: F                HR:           59 bpm. Exam Location:  Inpatient Procedure: 2D Echo, Cardiac Doppler and Color Doppler Indications:    CAD Native Vessel I25.10  History:        Patient has no prior history of Echocardiogram examinations. CAD                 and STEMI; Risk Factors:Hypertension.  Sonographer:    Greer Pickerel Referring Phys: Burnell Blanks  Sonographer Comments: No apical window, no parasternal window, suboptimal subcostal window, Technically challenging study due to limited acoustic windows and Technically difficult study due to poor echo windows. Image acquisition challenging due to respiratory motion and Image acquisition challenging due to COPD. IMPRESSIONS  1. Images  are not interpretable.  2. Left ventricular endocardial border not optimally defined to evaluate regional wall motion. Left ventricular diastolic function could not be evaluated.  3. Right ventricular systolic function was not well visualized. The right ventricular size is not well visualized.  4. The mitral valve was not assessed. No evidence of mitral valve regurgitation. No evidence of mitral stenosis. Severe mitral annular calcification.  5. The aortic valve was not assessed. Aortic valve regurgitation is not visualized. No aortic stenosis is present.  6. The inferior vena cava is dilated in size with <50% respiratory variability, suggesting right atrial pressure of 15 mmHg. FINDINGS  Left Ventricle: Left ventricular endocardial border not optimally defined to evaluate regional wall motion. There is no left ventricular hypertrophy. Left ventricular diastolic function could not be evaluated. Right Ventricle: The right ventricular size is not well visualized. Right vetricular wall thickness was not assessed. Right ventricular systolic function was not well  visualized. Left Atrium: Left atrial size was not assessed. Right Atrium: Right atrial size was not assessed. Pericardium: The pericardium was not assessed. Mitral Valve: The mitral valve was not assessed. Severe mitral annular calcification. No evidence of mitral valve regurgitation. No evidence of mitral valve stenosis. Tricuspid Valve: The tricuspid valve is not assessed. Tricuspid valve regurgitation is not demonstrated. No evidence of tricuspid stenosis. Aortic Valve: The aortic valve was not assessed. Aortic valve regurgitation is not visualized. No aortic stenosis is present. Pulmonic Valve: The pulmonic valve was not assessed. Pulmonic valve regurgitation is not visualized. No evidence of pulmonic stenosis. Aorta: The aortic root was not well visualized. Venous: The inferior vena cava is dilated in size with less than 50% respiratory variability, suggesting right atrial pressure of 15 mmHg. IAS/Shunts: No atrial level shunt detected by color flow Doppler. Skeet Latch MD Electronically signed by Skeet Latch MD Signature Date/Time: 03/28/2022/7:28:05 PM    Final       Assessment & Plan    66 y.o. female with pmhx cocaine use, MJ use, tobacco use who presents to the ER with anterior STEMI 03/27/2022.   #STEMI Cont ASA, Atorvastatin, coreg, ticagrelor Repeat echo pending to reassess LV function  #HTN Cont above and imdur/hydral  #Acute respiratory failure Resolved.  #NSVT post reperfusion. Continue coreg.  #Polysubstance abuse Cessation encouraged  Possible discharge later today pending echo results.  For questions or updates, please contact [place] Please consult [email or web address] for contact info under        Signed, Vickie Epley, MD  03/30/2022, 8:22 AM

## 2022-03-30 NOTE — Plan of Care (Signed)
  Problem: Education: Goal: Knowledge of General Education information will improve Description: Including pain rating scale, medication(s)/side effects and non-pharmacologic comfort measures 03/30/2022 1427 by Carlynn Spry, RN Outcome: Adequate for Discharge 03/30/2022 1427 by Carlynn Spry, RN Outcome: Adequate for Discharge   Problem: Health Behavior/Discharge Planning: Goal: Ability to manage health-related needs will improve 03/30/2022 1427 by Carlynn Spry, RN Outcome: Adequate for Discharge 03/30/2022 1427 by Carlynn Spry, RN Outcome: Adequate for Discharge   Problem: Clinical Measurements: Goal: Ability to maintain clinical measurements within normal limits will improve 03/30/2022 1427 by Carlynn Spry, RN Outcome: Adequate for Discharge 03/30/2022 1427 by Carlynn Spry, RN Outcome: Adequate for Discharge Goal: Will remain free from infection 03/30/2022 1427 by Carlynn Spry, RN Outcome: Adequate for Discharge 03/30/2022 1427 by Carlynn Spry, RN Outcome: Adequate for Discharge Goal: Diagnostic test results will improve 03/30/2022 1427 by Carlynn Spry, RN Outcome: Adequate for Discharge 03/30/2022 1427 by Carlynn Spry, RN Outcome: Adequate for Discharge Goal: Respiratory complications will improve 03/30/2022 1427 by Carlynn Spry, RN Outcome: Adequate for Discharge 03/30/2022 1427 by Carlynn Spry, RN Outcome: Adequate for Discharge Goal: Cardiovascular complication will be avoided 03/30/2022 1427 by Carlynn Spry, RN Outcome: Adequate for Discharge 03/30/2022 1427 by Carlynn Spry, RN Outcome: Adequate for Discharge   Problem: Activity: Goal: Risk for activity intolerance will decrease 03/30/2022 1427 by Carlynn Spry, RN Outcome: Adequate for Discharge 03/30/2022 1427 by Carlynn Spry, RN Outcome: Adequate for Discharge   Problem: Nutrition: Goal: Adequate  nutrition will be maintained 03/30/2022 1427 by Carlynn Spry, RN Outcome: Adequate for Discharge 03/30/2022 1427 by Carlynn Spry, RN Outcome: Adequate for Discharge   Problem: Coping: Goal: Level of anxiety will decrease 03/30/2022 1427 by Carlynn Spry, RN Outcome: Adequate for Discharge 03/30/2022 1427 by Carlynn Spry, RN Outcome: Adequate for Discharge   Problem: Elimination: Goal: Will not experience complications related to bowel motility 03/30/2022 1427 by Carlynn Spry, RN Outcome: Adequate for Discharge 03/30/2022 1427 by Carlynn Spry, RN Outcome: Adequate for Discharge Goal: Will not experience complications related to urinary retention 03/30/2022 1427 by Carlynn Spry, RN Outcome: Adequate for Discharge 03/30/2022 1427 by Carlynn Spry, RN Outcome: Adequate for Discharge   Problem: Pain Managment: Goal: General experience of comfort will improve 03/30/2022 1427 by Carlynn Spry, RN Outcome: Adequate for Discharge 03/30/2022 1427 by Carlynn Spry, RN Outcome: Adequate for Discharge   Problem: Safety: Goal: Ability to remain free from injury will improve 03/30/2022 1427 by Carlynn Spry, RN Outcome: Adequate for Discharge 03/30/2022 1427 by Carlynn Spry, RN Outcome: Adequate for Discharge   Problem: Skin Integrity: Goal: Risk for impaired skin integrity will decrease Outcome: Adequate for Discharge

## 2022-03-30 NOTE — Discharge Instructions (Signed)
Call [facility] at [phone] if any bleeding, swelling or drainage at cath site.  May shower, no tub baths for 48 hours for groin sticks. No lifting over 5 pounds for 5 days.  No Driving for 5 days   Heart Healthy Diet  Take 1 NTG, under your tongue, while sitting.  If no relief of pain may repeat NTG, one tab every 5 minutes up to 3 tablets total over 15 minutes.  If no relief CALL 911.  If you have dizziness/lightheadness  while taking NTG, stop taking and call 911.         Do not stop asprin or Brilinta these keep your new stent open and stopping could cause a heart attack.  Stop tobacco, alcohol and any non prescribed drugs.

## 2022-03-30 NOTE — Discharge Summary (Signed)
Discharge Summary    Patient ID: Cassidy Larson MRN: [id number]; DOB: 02-Apr-1956  Admit date: 03/27/2022 Discharge date: 03/31/2022  PCP:  Grayce Sessions, NP   [facility] Providers Cardiologist:  Verne Carrow, MD        Discharge Diagnoses    Principal Problem:   Acute ST elevation myocardial infarction (STEMI) due to occlusion of left anterior descending (LAD) coronary artery [facility]) Active Problems:   On mechanically assisted ventilation (HCC) extubated 03/28/22    Acute respiratory failure with hypoxia (HCC)   Cocaine abuse (HCC)   Tobacco abuse   CAD in native artery residual post LAD stent, 100% RCA and 80% LCx.    S/P angioplasty with stent to mLAD 03/27/22   HLD (hyperlipidemia)   Malnutrition of moderate degree    Diagnostic Studies/Procedures    Cardiac cath 03/27/22    Prox RCA to Mid RCA lesion is 100% stenosed.   Mid Cx lesion is 80% stenosed.   Mid LAD lesion is 100% stenosed.   A drug-eluting stent was successfully placed using a SYNERGY XD 2.50X16.   Post intervention, there is a 0% residual stenosis.   Acute anterior STEMI secondary to thrombotic occlusion of the mid LAD Successful PTCA/DES x 1 mid LAD Moderately severe mid Circumflex stenosis Dominant RCA with chronic occlusion of the mid RCA. The distal RCA branches fill from left to right collaterals.  LVEDP=17 mmHg Pt intubated during case secondary to agitation.    Recommendations: Will admit to the ICU. Will ask PCCM team to assist in ventilator management. Will continue Aggrastat infusion for 4 hours. Will give Brilinta 180 mg po x 1 once OG tube in place. Will then continue ASA/Brilinta for one year. It does not appear that she has a true ASA allergy. Will start high intensity statin. Echo in am. No beta blocker given cocaine abuse. Consider PCI of the Circumflex but this lesion is not critical and it may be best to observe her compliance with medical therapy and cocaine  abstinence before placing additional coronary stents.   Diagnostic Dominance: Right  Intervention    Echo 03/28/22 IMPRESSIONS     1. Images are not interpretable.   2. Left ventricular endocardial border not optimally defined to evaluate  regional wall motion. Left ventricular diastolic function could not be  evaluated.   3. Right ventricular systolic function was not well visualized. The right  ventricular size is not well visualized.   4. The mitral valve was not assessed. No evidence of mitral valve  regurgitation. No evidence of mitral stenosis. Severe mitral annular  calcification.   5. The aortic valve was not assessed. Aortic valve regurgitation is not  visualized. No aortic stenosis is present.   6. The inferior vena cava is dilated in size with <50% respiratory  variability, suggesting right atrial pressure of 15 mmHg.   FINDINGS   Left Ventricle: Left ventricular endocardial border not optimally defined  to evaluate regional wall motion. There is no left ventricular  hypertrophy. Left ventricular diastolic function could not be evaluated.   Right Ventricle: The right ventricular size is not well visualized. Right  vetricular wall thickness was not assessed. Right ventricular systolic  function was not well visualized.   Left Atrium: Left atrial size was not assessed.   Right Atrium: Right atrial size was not assessed.   Pericardium: The pericardium was not assessed.   Mitral Valve: The mitral valve was not assessed. Severe mitral annular  calcification. No evidence of  mitral valve regurgitation. No evidence of  mitral valve stenosis.   Tricuspid Valve: The tricuspid valve is not assessed. Tricuspid valve  regurgitation is not demonstrated. No evidence of tricuspid stenosis.   Aortic Valve: The aortic valve was not assessed. Aortic valve  regurgitation is not visualized. No aortic stenosis is present.   Pulmonic Valve: The pulmonic valve was not assessed.  Pulmonic valve  regurgitation is not visualized. No evidence of pulmonic stenosis.   Aorta: The aortic root was not well visualized.   Venous: The inferior vena cava is dilated in size with less than 50%  respiratory variability, suggesting right atrial pressure of 15 mmHg.   IAS/Shunts: No atrial level shunt detected by color flow Doppler.   Echo 03/30/22 IMPRESSIONS     1. Left ventricular ejection fraction, by estimation, is 55 to 60%. The  left ventricle has normal function. The left ventricle demonstrates  regional wall motion abnormalities (anteroapical hypokinesis). Left  ventricular diastolic parameters were normal.   No LV thrombus   2. Right ventricular systolic function is normal. The right ventricular  size is normal. Tricuspid regurgitation signal is inadequate for assessing  PA pressure.   3. Left atrial size was mildly dilated.   4. The mitral valve is grossly normal. No evidence of mitral valve  regurgitation.   5. The aortic valve is tricuspid. Aortic valve regurgitation is not  visualized. No aortic stenosis is present.   Comparison(s): Prior images reviewed side by side. This study is  interpretable.   FINDINGS   Left Ventricle: Left ventricular ejection fraction, by estimation, is 55  to 60%. The left ventricle has normal function. The left ventricle  demonstrates regional wall motion abnormalities. The left ventricular  internal cavity size was normal in size.  There is no left ventricular hypertrophy. Left ventricular diastolic  parameters were normal.      LV Wall Scoring:  The apical septal segment, apical anterior segment, and apex are  hypokinetic.   Right Ventricle: The right ventricular size is normal. Right vetricular  wall thickness was not well visualized. Right ventricular systolic  function is normal. Tricuspid regurgitation signal is inadequate for  assessing PA pressure.   Left Atrium: Left atrial size was mildly dilated.   Right  Atrium: Right atrial size was normal in size.   Pericardium: There is no evidence of pericardial effusion.   Mitral Valve: The mitral valve is grossly normal. No evidence of mitral  valve regurgitation.   Tricuspid Valve: The tricuspid valve is normal in structure. Tricuspid  valve regurgitation is not demonstrated. No evidence of tricuspid  stenosis.   Aortic Valve: The aortic valve is tricuspid. Aortic valve regurgitation is  not visualized. No aortic stenosis is present. Aortic valve mean gradient  measures 5.0 mmHg. Aortic valve peak gradient measures 8.4 mmHg. Aortic  valve area, by VTI measures 1.33  cm.   Pulmonic Valve: The pulmonic valve was not well visualized. Pulmonic valve  regurgitation is not visualized.   Aorta: The aortic root is normal in size and structure.   IAS/Shunts: No atrial level shunt detected by color flow Doppler.  _____________   History of Present Illness     Cassidy Larson is a 66 y.o. female with prior cocaine use, THC use, tobacco use, presented to ER with crushing chest pain that started 4 hours piror to admit.  She had taken cocaine day of admit and later developed the severe chest pain, left sided substernal.  She did take a NTG  but did not help.  No prior cardiac hx and no medications at home.  + tobacco use and cocaine use.  She stated ASA would make her vomit but here she has been able to take.   In ER pain was 10/10 chest pain with radiation to her back.  BP was stable she was given ASSA 325 and IV heparin.  EKG with ST elevations in precordial leads V2-V4. She was taken to cath lab emergently for STEMI.    In the cath lab she rec'd DES to [place] and was intubated due to increasing sedation requirements and agitation on the table.   Hospital Course     Consultants: CCM managed Vent in ICU.   Dr. Duwayne Heck saw in consult.  Pt extubated 03/28/22.  She is on ASA and Brilinta and High dose statin. Coreg and hydralazine started and low dose  nitrates started.   On Echo could not evaluate LV function.  Will plan repeat prior to discharge. EF was 55-60% + RWMA, no LV thrombus.   She did have NSVT post reperfusion.  Mg+ repleted and on Coreg and continued monitoring.  For her HTN on hydralazine and imdur coreg.  Have encouraged cessation of polysubstance abuse.   Cardiac rehab has walked the pt and she is stable.  She is on Medicaid for medications.  I have Care manager and social work to see if we can help pt.    Plan for follow-up with Dr. Angelena Form and cardiac rehab.  EKG SR and continues with sT elevation but evolving and improved from admit.   Pk troponin 19,192 ,  LPa was 204.Marland Kitchen     Please note pt could not be discharged because she could not go by pharmacy for medication.  She was given a bus pass and no real way to go to pharmacy.  She should not miss brillinta.    Today she assures RN that she will be able to obtain meds.    Did the patient have an acute coronary syndrome (MI, NSTEMI, STEMI, etc) this admission?:  Yes                               AHA/ACC Clinical Performance & Quality Measures: Aspirin prescribed? - Yes ADP Receptor Inhibitor (Plavix/Clopidogrel, Brilinta/Ticagrelor or Effient/Prasugrel) prescribed (includes medically managed patients)? - Yes Beta Blocker prescribed? - Yes High Intensity Statin (Lipitor 40-80mg  or Crestor 20-40mg ) prescribed? - Yes EF assessed during THIS hospitalization? - Yes For EF <40%, was ACEI/ARB prescribed? - Not Applicable (EF >/= 81%) For EF <40%, Aldosterone Antagonist (Spironolactone or Eplerenone) prescribed? - Not Applicable (EF >/= 27%) Cardiac Rehab Phase II ordered (including medically managed patients)? - Yes       The patient will be scheduled for a TOC follow up appointment in 7 days days.  A message has been sent to the [facility] and Scheduling Pool at the office where the patient should be seen for follow up.  _____________  Discharge Vitals Blood pressure 108/60,  pulse 61, temperature 98.3 F (36.8 C), temperature source Oral, resp. rate 17, height 5\' 2"  (1.575 m), weight 39.1 kg, SpO2 100 %.  Filed Weights   03/28/22 0000 03/29/22 0619  Weight: 40.8 kg 39.1 kg    Labs & Radiologic Studies    CBC No results for input(s): "WBC", "NEUTROABS", "HGB", "HCT", "MCV", "PLT" in the last 72 hours.  Basic Metabolic Panel Recent Labs    03/29/22 [phone]  NA 137  K 4.5  CL 108  CO2 21*  GLUCOSE 145*  BUN 23  CREATININE 1.22*  CALCIUM 8.6*  MG 2.3   Liver Function Tests No results for input(s): "AST", "ALT", "ALKPHOS", "BILITOT", "PROT", "ALBUMIN" in the last 72 hours.  No results for input(s): "LIPASE", "AMYLASE" in the last 72 hours. High Sensitivity Troponin:   Recent Labs  Lab 03/27/22 2211 03/28/22 0050  TROPONINIHS 11 19,192*    BNP Invalid input(s): "POCBNP" D-Dimer No results for input(s): "DDIMER" in the last 72 hours. Hemoglobin A1C No results for input(s): "HGBA1C" in the last 72 hours.  Fasting Lipid Panel No results for input(s): "CHOL", "HDL", "LDLCALC", "TRIG", "CHOLHDL", "LDLDIRECT" in the last 72 hours.  Thyroid Function Tests No results for input(s): "TSH", "T4TOTAL", "T3FREE", "THYROIDAB" in the last 72 hours.  Invalid input(s): "FREET3" _____________  ECHOCARDIOGRAM COMPLETE  Result Date: 03/30/2022    ECHOCARDIOGRAM REPORT   Patient Name:   Cassidy Larson Date of Exam: 03/30/2022 Medical Rec #:  [id number]        Height:       62.0 in Accession #:    [id number]       Weight:       86.2 lb Date of Birth:  March 11, 1956        BSA:          1.337 m Patient Age:    54 years         BP:           117/71 mmHg Patient Gender: F                HR:           76 bpm. Exam Location:  Inpatient Procedure: 2D Echo, Cardiac Doppler, Color Doppler and Intracardiac            Opacification Agent Indications:    Acute MI  History:        Patient has no prior history of Echocardiogram examinations and                 Patient has prior  history of Echocardiogram examinations, most                 recent 03/28/2022. CAD; Heart cath 03/27/22.  Sonographer:    Merrie Roof RDCS Referring Phys: [id number] [place]  1. Left ventricular ejection fraction, by estimation, is 55 to 60%. The left ventricle has normal function. The left ventricle demonstrates regional wall motion abnormalities (anteroapical hypokinesis). Left ventricular diastolic parameters were normal.  No LV thrombus  2. Right ventricular systolic function is normal. The right ventricular size is normal. Tricuspid regurgitation signal is inadequate for assessing PA pressure.  3. Left atrial size was mildly dilated.  4. The mitral valve is grossly normal. No evidence of mitral valve regurgitation.  5. The aortic valve is tricuspid. Aortic valve regurgitation is not visualized. No aortic stenosis is present. Comparison(s): Prior images reviewed side by side. This study is interpretable. FINDINGS  Left Ventricle: Left ventricular ejection fraction, by estimation, is 55 to 60%. The left ventricle has normal function. The left ventricle demonstrates regional wall motion abnormalities. The left ventricular internal cavity size was normal in size. There is no left ventricular hypertrophy. Left ventricular diastolic parameters were normal.  LV Wall Scoring: The apical septal segment, apical anterior segment, and apex are hypokinetic. Right Ventricle: The right ventricular size is normal. Right vetricular wall thickness was not well visualized.  Right ventricular systolic function is normal. Tricuspid regurgitation signal is inadequate for assessing PA pressure. Left Atrium: Left atrial size was mildly dilated. Right Atrium: Right atrial size was normal in size. Pericardium: There is no evidence of pericardial effusion. Mitral Valve: The mitral valve is grossly normal. No evidence of mitral valve regurgitation. Tricuspid Valve: The tricuspid valve is normal in structure. Tricuspid  valve regurgitation is not demonstrated. No evidence of tricuspid stenosis. Aortic Valve: The aortic valve is tricuspid. Aortic valve regurgitation is not visualized. No aortic stenosis is present. Aortic valve mean gradient measures 5.0 mmHg. Aortic valve peak gradient measures 8.4 mmHg. Aortic valve area, by VTI measures 1.33 cm. Pulmonic Valve: The pulmonic valve was not well visualized. Pulmonic valve regurgitation is not visualized. Aorta: The aortic root is normal in size and structure. IAS/Shunts: No atrial level shunt detected by color flow Doppler.  LEFT VENTRICLE PLAX 2D LVIDd:         4.40 cm   Diastology LVIDs:         3.00 cm   LV e' medial:    8.05 cm/s LV PW:         0.70 cm   LV E/e' medial:  10.9 LV IVS:        0.70 cm   LV e' lateral:   11.60 cm/s LVOT diam:     1.60 cm   LV E/e' lateral: 7.5 LV SV:         37 LV SV Index:   28 LVOT Area:     2.01 cm  RIGHT VENTRICLE RV Basal diam:  3.20 cm RV S prime:     12.50 cm/s TAPSE (M-mode): 2.1 cm LEFT ATRIUM             Index        RIGHT ATRIUM           Index LA diam:        3.70 cm 2.77 cm/m   RA Area:     11.60 cm LA Vol (A2C):   55.9 ml 41.82 ml/m  RA Volume:   27.50 ml  20.57 ml/m LA Vol (A4C):   36.5 ml 27.30 ml/m LA Biplane Vol: 46.4 ml 34.71 ml/m  AORTIC VALVE AV Area (Vmax):    1.34 cm AV Area (Vmean):   1.26 cm AV Area (VTI):     1.33 cm AV Vmax:           145.00 cm/s AV Vmean:          99.200 cm/s AV VTI:            0.280 m AV Peak Grad:      8.4 mmHg AV Mean Grad:      5.0 mmHg LVOT Vmax:         96.40 cm/s LVOT Vmean:        62.400 cm/s LVOT VTI:          0.185 m LVOT/AV VTI ratio: 0.66  AORTA Ao Root diam: 2.40 cm MITRAL VALVE MV Area (PHT): 4.49 cm    SHUNTS MV Decel Time: 169 msec    Systemic VTI:  0.18 m MV E velocity: 87.50 cm/s  Systemic Diam: 1.60 cm MV A velocity: 63.40 cm/s MV E/A ratio:  1.38 Riley Lam MD Electronically signed by Riley Lam MD Signature Date/Time: 03/30/2022/9:12:48 PM    Final     ECHOCARDIOGRAM COMPLETE  Result Date: 03/28/2022    ECHOCARDIOGRAM REPORT   Patient Name:  Cassidy Larson Date of Exam: 03/28/2022 Medical Rec #:  [id number]        Height:       62.0 in Accession #:    [id number]       Weight:       89.9 lb Date of Birth:  08-19-1955        BSA:          1.361 m Patient Age:    21 years         BP:           125/67 mmHg Patient Gender: F                HR:           59 bpm. Exam Location:  Inpatient Procedure: 2D Echo, Cardiac Doppler and Color Doppler Indications:    CAD Native Vessel I25.10  History:        Patient has no prior history of Echocardiogram examinations. CAD                 and STEMI; Risk Factors:Hypertension.  Sonographer:    Greer Pickerel Referring Phys: Burnell Blanks  Sonographer Comments: No apical window, no parasternal window, suboptimal subcostal window, Technically challenging study due to limited acoustic windows and Technically difficult study due to poor echo windows. Image acquisition challenging due to respiratory motion and Image acquisition challenging due to COPD. IMPRESSIONS  1. Images are not interpretable.  2. Left ventricular endocardial border not optimally defined to evaluate regional wall motion. Left ventricular diastolic function could not be evaluated.  3. Right ventricular systolic function was not well visualized. The right ventricular size is not well visualized.  4. The mitral valve was not assessed. No evidence of mitral valve regurgitation. No evidence of mitral stenosis. Severe mitral annular calcification.  5. The aortic valve was not assessed. Aortic valve regurgitation is not visualized. No aortic stenosis is present.  6. The inferior vena cava is dilated in size with <50% respiratory variability, suggesting right atrial pressure of 15 mmHg. FINDINGS  Left Ventricle: Left ventricular endocardial border not optimally defined to evaluate regional wall motion. There is no left ventricular hypertrophy. Left  ventricular diastolic function could not be evaluated. Right Ventricle: The right ventricular size is not well visualized. Right vetricular wall thickness was not assessed. Right ventricular systolic function was not well visualized. Left Atrium: Left atrial size was not assessed. Right Atrium: Right atrial size was not assessed. Pericardium: The pericardium was not assessed. Mitral Valve: The mitral valve was not assessed. Severe mitral annular calcification. No evidence of mitral valve regurgitation. No evidence of mitral valve stenosis. Tricuspid Valve: The tricuspid valve is not assessed. Tricuspid valve regurgitation is not demonstrated. No evidence of tricuspid stenosis. Aortic Valve: The aortic valve was not assessed. Aortic valve regurgitation is not visualized. No aortic stenosis is present. Pulmonic Valve: The pulmonic valve was not assessed. Pulmonic valve regurgitation is not visualized. No evidence of pulmonic stenosis. Aorta: The aortic root was not well visualized. Venous: The inferior vena cava is dilated in size with less than 50% respiratory variability, suggesting right atrial pressure of 15 mmHg. IAS/Shunts: No atrial level shunt detected by color flow Doppler. Skeet Latch MD Electronically signed by Skeet Latch MD Signature Date/Time: 03/28/2022/7:28:05 PM    Final    DG Abd 1 View  Result Date: 03/28/2022 CLINICAL DATA:  OG tube placement EXAM: ABDOMEN - 1 VIEW COMPARISON:  None Available. FINDINGS: Enteric tube terminates  in the proximal gastric body. IMPRESSION: Enteric tube terminates in the proximal gastric body. Electronically Signed   By: Charline Bills M.D.   On: 03/28/2022 00:15   DG CHEST PORT 1 VIEW  Result Date: 03/28/2022 CLINICAL DATA:  ETT placement EXAM: PORTABLE CHEST 1 VIEW COMPARISON:  01/19/2022 FINDINGS: Endotracheal tube terminates 4 cm above the carina. Mild bibasilar opacities, likely atelectasis. No pleural effusion or pneumothorax. The heart  normal in size.  Thoracic aortic atherosclerosis. Enteric tube courses into the proximal stomach. IMPRESSION: Endotracheal tube terminates 4 cm above the carina. Enteric tube courses into the proximal stomach. Electronically Signed   By: Charline Bills M.D.   On: 03/28/2022 00:14   CARDIAC CATHETERIZATION  Addendum Date: 03/27/2022     Prox RCA to Mid RCA lesion is 100% stenosed.   Mid Cx lesion is 80% stenosed.   Mid LAD lesion is 100% stenosed.   A drug-eluting stent was successfully placed using a SYNERGY XD 2.50X16.   Post intervention, there is a 0% residual stenosis. Acute anterior STEMI secondary to thrombotic occlusion of the mid LAD Successful PTCA/DES x 1 mid LAD Moderately severe mid Circumflex stenosis Dominant RCA with chronic occlusion of the mid RCA. The distal RCA branches fill from left to right collaterals. LVEDP=17 mmHg Pt intubated during case secondary to agitation. Recommendations: Will admit to the ICU. Will ask PCCM team to assist in ventilator management. Will continue Aggrastat infusion for 4 hours. Will give Brilinta 180 mg po x 1 once OG tube in place. Will then continue ASA/Brilinta for one year. It does not appear that she has a true ASA allergy. Will start high intensity statin. Echo in am. No beta blocker given cocaine abuse. Consider PCI of the Circumflex but this lesion is not critical and it may be best to observe her compliance with medical therapy and cocaine abstinence before placing additional coronary stents.   Result Date: 03/27/2022   Prox RCA to Mid RCA lesion is 100% stenosed.   Mid Cx lesion is 80% stenosed.   Mid LAD lesion is 100% stenosed.   A drug-eluting stent was successfully placed using a SYNERGY XD 2.50X16.   Post intervention, there is a 0% residual stenosis. Acute anterior STEMI secondary to thrombotic occlusion of the mid LAD Successful PTCA/DES x 1 mid LAD Moderately severe mid Circumflex stenosis Dominant RCA with chronic occlusion of the mid RCA.  The distal RCA branches fill from left to right collaterals. LVEDP=17 mmHg Pt intubated during case secondary to agitation. Recommendations: Will admit to the ICU. Will ask PCCM team to assist in ventilator management. Will continue Aggrastat infusion for 4 hours. Will give Brilinta 180 mg po x 1 once OG tube in place. Will then continue ASA/Brilinta for one year. It does not appear that she has a true ASA allergy. Will start high intensity statin. Echo in am. No beta blocker given cocaine abuse. Consider PCI of the Circumflex but this lesion is not critical and it may be best to observe her compliance with medical therapy and cocaine abstinence before placing additional coronary stents.   Disposition   Pt is being discharged home today in good condition.  Follow-up Plans & Appointments   Call [facility] at [phone] if any bleeding, swelling or drainage at cath site.  May shower, no tub baths for 48 hours for groin sticks. No lifting over 5 pounds for 5 days.  No Driving for 5 days   Heart Healthy Diet  Take 1 NTG, under your tongue, while sitting.  If no relief of pain may repeat NTG, one tab every 5 minutes up to 3 tablets total over 15 minutes.  If no relief CALL 911.  If you have dizziness/lightheadness  while taking NTG, stop taking and call 911.         Do not stop asprin or Brilinta these keep your new stent open and stopping could cause a heart attack.  Stop tobacco, alcohol and any non prescribed drugs.   Follow-up Information     Elgie Collard, PA-C Follow up on 04/03/2022.   Specialty: Cardiology Why: 1:05 pm Contact information: [address] [place] [id number] [phone]                Discharge Instructions     Amb Referral to Cardiac Rehabilitation   Complete by: As directed    Diagnosis:  Coronary Stents STEMI     After initial evaluation and assessments completed: Virtual Based Care may be provided alone or in  conjunction with Phase 2 Cardiac Rehab based on patient barriers.: Yes   Intensive Cardiac Rehabilitation (ICR) [place] location only OR Traditional Cardiac Rehabilitation (TCR) *If criteria for ICR are not met will enroll in TCR [facility] only): Yes        Discharge Medications   Allergies as of 03/31/2022       Reactions   Aspirin Other (See Comments)   Makes sick on stomach   Latex Other (See Comments)   unspecified        Medication List     TAKE these medications    acetaminophen 500 MG tablet Commonly known as: TYLENOL Take 500 mg by mouth every 6 (six) hours as needed for mild pain or moderate pain.   aspirin 81 MG chewable tablet Chew 1 tablet (81 mg total) by mouth daily.   atorvastatin 80 MG tablet Commonly known as: LIPITOR Take 1 tablet (80 mg total) by mouth daily.   carvedilol 6.25 MG tablet Commonly known as: COREG Take 1 tablet (6.25 mg total) by mouth 2 (two) times daily with a meal.   hydrALAZINE 25 MG tablet Commonly known as: APRESOLINE Take 1 tablet (25 mg total) by mouth every 8 (eight) hours.   isosorbide mononitrate 30 MG 24 hr tablet Commonly known as: IMDUR Take 0.5 tablets (15 mg total) by mouth daily.   ticagrelor 90 MG Tabs tablet Commonly known as: BRILINTA Take 1 tablet (90 mg total) by mouth 2 (two) times daily.           Outstanding Labs/Studies   BMP In 6 weeks hepatic and lipid  Duration of Discharge Encounter   Greater than 30 minutes including physician time.  Signed, Cecilie Kicks, NP 03/31/2022, 10:07 AM

## 2022-03-30 NOTE — Progress Notes (Signed)
Occupational Therapy Treatment Patient Details Name: Cassidy Larson MRN: [id number] DOB: Oct 23, 1955 Today's Date: 03/30/2022   History of present illness 66 yo admitted 03/27/22 after found on street by EMS with crushing chest pain, STEMI taken to cath lab with PCI and stent placed. Intubated in cath lab, extubated 10/12. PMhx: cocaine and tobacco abuse, anxiety, CKD, HTN, Lupus   OT comments  Patient received in bed and agreeable to OT session. Patient required min assist to donn mesh panties due to difficulty using reacher. Patient instructed on AE use for LB dressing with reacher and sock aide with min assist to perform due to difficulty with reacher use and donning sock over sock aide. Patient performed 2 transfers to bathroom with supervision for safety. Patient could benefit from further OT sessions to provide further education on AE use for LB dressing. Acute OT to continue to follow.    Recommendations for follow up therapy are one component of a multi-disciplinary discharge planning process, led by the attending physician.  Recommendations may be updated based on patient status, additional functional criteria and insurance authorization.    Follow Up Recommendations  No OT follow up    Assistance Recommended at Discharge PRN  Patient can return home with the following  Assist for transportation   Equipment Recommendations  None recommended by OT    Recommendations for Other Services      Precautions / Restrictions Precautions Precautions: Fall;Other (comment) (right wrist cath) Restrictions Weight Bearing Restrictions: No       Mobility Bed Mobility Overal bed mobility: Needs Assistance Bed Mobility: Supine to Sit     Supine to sit: Supervision, HOB elevated     General bed mobility comments: verbal cues for R wrist precautions    Transfers Overall transfer level: Needs assistance Equipment used: None Transfers: Sit to/from Stand Sit to Stand: Supervision            General transfer comment: supervision for safety     Balance Overall balance assessment: Needs assistance Sitting-balance support: No upper extremity supported Sitting balance-Leahy Scale: Good     Standing balance support: No upper extremity supported Standing balance-Leahy Scale: Fair Standing balance comment: stood to [facility]                           ADL either performed or assessed with clinical judgement   ADL Overall ADL's : Needs assistance/impaired                 Upper Body Dressing : Set up;Standing Upper Body Dressing Details (indicate cue type and reason): to donn gown Lower Body Dressing: Minimal assistance;Sitting/lateral leans Lower Body Dressing Details (indicate cue type and reason): patient required assistance to donn mesh panties and socks. Patient educated on reacher use and sock aide and required min assist for use and verbal cues for education Toilet Transfer: Supervision/safety;Ambulation   Toileting- Clothing Manipulation and Hygiene: Modified independent;Sit to/from stand         General ADL Comments: education on AE use for LB dressing    Extremity/Trunk Assessment              Vision       Perception     Praxis      Cognition Arousal/Alertness: Awake/alert Behavior During Therapy: WFL for tasks assessed/performed Overall Cognitive Status: Within Functional Limits for tasks assessed  General Comments: asked often why she couldn't go home        Exercises      Shoulder Instructions       General Comments      Pertinent Vitals/ Pain       Pain Assessment Pain Assessment: No/denies pain Pain Intervention(s): Monitored during session  Home Living                                          Prior Functioning/Environment              Frequency  Min 2X/week        Progress Toward Goals  OT Goals(current goals can now  be found in the care plan section)  Progress towards OT goals: Progressing toward goals  Acute Rehab OT Goals OT Goal Formulation: With patient Time For Goal Achievement: 04/12/22 Potential to Achieve Goals: Good ADL Goals Additional ADL Goal #1: Pt will complete basic ADLs modified independently. Additional ADL Goal #2: Pt will be knowledgable in use of AE for accessing R LE.  Plan Discharge plan remains appropriate    Co-evaluation                 AM-PAC OT "6 Clicks" Daily Activity     Outcome Measure   Help from another person eating meals?: None Help from another person taking care of personal grooming?: A Little Help from another person toileting, which includes using toliet, bedpan, or urinal?: A Little Help from another person bathing (including washing, rinsing, drying)?: A Little Help from another person to put on and taking off regular upper body clothing?: A Little Help from another person to put on and taking off regular lower body clothing?: A Little 6 Click Score: 19    End of Session    OT Visit Diagnosis: Muscle weakness (generalized) (M62.81)   Activity Tolerance Patient tolerated treatment well   Patient Left in chair;with call bell/phone within reach   Nurse Communication Mobility status        Time: 6759-1638 OT Time Calculation (min): 42 min  Charges: OT General Charges $OT Visit: 1 Visit OT Treatments $Self Care/Home Management : 38-52 mins  Lodema Hong, [place]  Office [place] 03/30/2022, 2:18 PM

## 2022-03-30 NOTE — Progress Notes (Signed)
Due to staffing and echo schedule, echocardiogram performed late. Echo personally viewed by Dr. Gasper Sells and she was deemed stable for discharge. However, through communication with RN, pt will need a bus pass to get home. This is a problem because she needs to get to the pharmacy to get her brilinta. I made the decision to hold her tonight and we will reassess with a better plan to get her medications tomorrow.    Ledora Bottcher, PA-C 03/30/2022, 5:16 PM [facility] [address] [place], [place] [address]

## 2022-03-30 NOTE — Social Work (Signed)
CSW met with client to address consult for SU and housing. CSW provided a SU resource list to pt and she informed CSW that she would look it over. CSW discussed pt's current living situation and she explained that she has been in her apartment for 4 to 5 years and it needs to be maintained. Pt explained that she has a meeting with them this Thursday. CSW encouraged to bring her concerns to that meeting. Pt explained that she does not have a phone. CSW inquired about friends and family and she informed CSW that she does not have any supports right now. CSW informed her that she may be able to use the phone when she goes to her meeting to connect with a SU provider.Pt expressed her appreciation for CSW efforts.

## 2022-03-31 LAB — CBC
HCT: 35 % — ABNORMAL LOW (ref 36.0–46.0)
Hemoglobin: 11.6 g/dL — ABNORMAL LOW (ref 12.0–15.0)
MCH: 30.5 pg (ref 26.0–34.0)
MCHC: 33.1 g/dL (ref 30.0–36.0)
MCV: 92.1 fL (ref 80.0–100.0)
Platelets: 232 10*3/uL (ref 150–400)
RBC: 3.8 MIL/uL — ABNORMAL LOW (ref 3.87–5.11)
RDW: 14.3 % (ref 11.5–15.5)
WBC: 5.4 10*3/uL (ref 4.0–10.5)
nRBC: 0 % (ref 0.0–0.2)

## 2022-03-31 LAB — BASIC METABOLIC PANEL
Anion gap: 9 (ref 5–15)
BUN: 27 mg/dL — ABNORMAL HIGH (ref 8–23)
CO2: 18 mmol/L — ABNORMAL LOW (ref 22–32)
Calcium: 8.8 mg/dL — ABNORMAL LOW (ref 8.9–10.3)
Chloride: 109 mmol/L (ref 98–111)
Creatinine, Ser: 1.5 mg/dL — ABNORMAL HIGH (ref 0.44–1.00)
GFR, Estimated: 39 mL/min — ABNORMAL LOW (ref >60)
Glucose, Bld: 93 mg/dL (ref 70–99)
Potassium: 4.2 mmol/L (ref 3.5–5.1)
Sodium: 136 mmol/L (ref 135–145)

## 2022-03-31 LAB — TSH: TSH: 0.864 u[IU]/mL (ref 0.350–4.500)

## 2022-03-31 LAB — MAGNESIUM: Magnesium: 2.2 mg/dL (ref 1.7–2.4)

## 2022-03-31 NOTE — Progress Notes (Signed)
Rounding Note    Patient Name: Cassidy Larson Date of Encounter: 03/31/2022  [facility] Cardiologist: Lauree Chandler, MD   Subjective   NAEO.  Unable to discharge yesterday due to transportation/medication problems.  Inpatient Medications    Scheduled Meds:  aspirin  81 mg Oral Daily   atorvastatin  80 mg Oral Daily   carvedilol  6.25 mg Oral BID WC   enoxaparin (LOVENOX) injection  30 mg Subcutaneous Q24H   feeding supplement  237 mL Oral BID BM   fiber supplement (BANATROL TF)  60 mL Oral BID   hydrALAZINE  25 mg Oral Q8H   isosorbide mononitrate  15 mg Oral Daily   sodium chloride flush  3 mL Intravenous Q12H   ticagrelor  90 mg Oral BID   Continuous Infusions:  sodium chloride Stopped (03/28/22 1057)   sodium chloride     PRN Meds: sodium chloride, acetaminophen, haloperidol lactate, ondansetron (ZOFRAN) IV, mouth rinse, sodium chloride flush   Vital Signs    Vitals:   03/30/22 2057 03/31/22 0100 03/31/22 0424 03/31/22 0809  BP: 111/60 118/71 116/71 108/60  Pulse: 70 71 67 64  Resp: 20 16 17 17   Temp: 98.5 F (36.9 C) 99 F (37.2 C) 98.3 F (36.8 C) 98.3 F (36.8 C)  TempSrc: Oral Oral Oral Oral  SpO2: 99% 97% 100% 100%  Weight:      Height:       No intake or output data in the 24 hours ending 03/31/22 0917     03/29/2022    6:19 AM 03/28/2022   12:00 AM 02/10/2019    1:50 PM  Last 3 Weights  Weight (lbs) 86 lb 3.2 oz 89 lb 15.2 oz 96 lb  Weight (kg) 39.1 kg 40.8 kg 43.545 kg      Telemetry    sinus - Personally Reviewed  ECG    Personally Reviewed  Physical Exam   GEN: No acute distress.  Chronically ill appearing. Neck: No JVD Cardiac: RRR, no murmurs, rubs, or gallops.  Respiratory: Clear to auscultation bilaterally. GI: Soft, nontender, non-distended  MS: No edema; No deformity. Neuro:  Nonfocal  Psych: Normal affect   Labs    High Sensitivity Troponin:   Recent Labs  Lab 03/27/22 2211  03/28/22 0050  TROPONINIHS 11 19,192*      Chemistry Recent Labs  Lab 03/27/22 2211 03/27/22 2317 03/28/22 0050 03/28/22 0055 03/28/22 0516 03/28/22 0557 03/29/22 0632  NA 137   < > 136   < > 140 139 137  K 4.5   < > 3.7   < > 4.8 4.6 4.5  CL 107  --  107  --   --  111 108  CO2 24  --  21*  --   --  22 21*  GLUCOSE 112*  --  99  --   --  84 145*  BUN 43*  --  38*  --   --  34* 23  CREATININE 1.43*  --  1.25*  --   --  1.21* 1.22*  CALCIUM 8.3*  --  8.2*  --   --  8.3* 8.6*  MG  --   --  1.8  --   --  2.4 2.3  PROT 7.1  --   --   --   --   --   --   ALBUMIN 2.7*  --   --   --   --   --   --  AST 26  --   --   --   --   --   --   ALT 16  --   --   --   --   --   --   ALKPHOS 57  --   --   --   --   --   --   BILITOT 0.7  --   --   --   --   --   --   GFRNONAA 41*  --  48*  --   --  50* 50*  ANIONGAP 6  --  8  --   --  6 8   < > = values in this interval not displayed.     Lipids  Recent Labs  Lab 03/27/22 2211  CHOL 165  TRIG 33  HDL 47  LDLCALC 111*  CHOLHDL 3.5     Hematology Recent Labs  Lab 03/27/22 2211 03/27/22 2317 03/28/22 0050 03/28/22 0055 03/28/22 0516  WBC 7.0  --  5.3  --   --   RBC 3.42*  --  3.70*  --   --   HGB 10.7*   < > 11.3* 10.9* 10.2*  HCT 32.8*   < > 34.4* 32.0* 30.0*  MCV 95.9  --  93.0  --   --   MCH 31.3  --  30.5  --   --   MCHC 32.6  --  32.8  --   --   RDW 14.7  --  14.5  --   --   PLT 221  --  250  --   --    < > = values in this interval not displayed.    Thyroid No results for input(s): "TSH", "FREET4" in the last 168 hours.  BNPNo results for input(s): "BNP", "PROBNP" in the last 168 hours.  DDimer No results for input(s): "DDIMER" in the last 168 hours.   Radiology    ECHOCARDIOGRAM COMPLETE  Result Date: 03/30/2022    ECHOCARDIOGRAM REPORT   Patient Name:   Cassidy Larson Date of Exam: 03/30/2022 Medical Rec #:  [id number]        Height:       62.0 in Accession #:    [id number]       Weight:       86.2 lb  Date of Birth:  February 17, 1956        BSA:          1.337 m Patient Age:    65 years         BP:           117/71 mmHg Patient Gender: F                HR:           76 bpm. Exam Location:  Inpatient Procedure: 2D Echo, Cardiac Doppler, Color Doppler and Intracardiac            Opacification Agent Indications:    Acute MI  History:        Patient has no prior history of Echocardiogram examinations and                 Patient has prior history of Echocardiogram examinations, most                 recent 03/28/2022. CAD; Heart cath 03/27/22.  Sonographer:    Merrie Roof RDCS Referring Phys: [id number] [place]  1. Left  ventricular ejection fraction, by estimation, is 55 to 60%. The left ventricle has normal function. The left ventricle demonstrates regional wall motion abnormalities (anteroapical hypokinesis). Left ventricular diastolic parameters were normal.  No LV thrombus  2. Right ventricular systolic function is normal. The right ventricular size is normal. Tricuspid regurgitation signal is inadequate for assessing PA pressure.  3. Left atrial size was mildly dilated.  4. The mitral valve is grossly normal. No evidence of mitral valve regurgitation.  5. The aortic valve is tricuspid. Aortic valve regurgitation is not visualized. No aortic stenosis is present. Comparison(s): Prior images reviewed side by side. This study is interpretable. FINDINGS  Left Ventricle: Left ventricular ejection fraction, by estimation, is 55 to 60%. The left ventricle has normal function. The left ventricle demonstrates regional wall motion abnormalities. The left ventricular internal cavity size was normal in size. There is no left ventricular hypertrophy. Left ventricular diastolic parameters were normal.  LV Wall Scoring: The apical septal segment, apical anterior segment, and apex are hypokinetic. Right Ventricle: The right ventricular size is normal. Right vetricular wall thickness was not well visualized. Right  ventricular systolic function is normal. Tricuspid regurgitation signal is inadequate for assessing PA pressure. Left Atrium: Left atrial size was mildly dilated. Right Atrium: Right atrial size was normal in size. Pericardium: There is no evidence of pericardial effusion. Mitral Valve: The mitral valve is grossly normal. No evidence of mitral valve regurgitation. Tricuspid Valve: The tricuspid valve is normal in structure. Tricuspid valve regurgitation is not demonstrated. No evidence of tricuspid stenosis. Aortic Valve: The aortic valve is tricuspid. Aortic valve regurgitation is not visualized. No aortic stenosis is present. Aortic valve mean gradient measures 5.0 mmHg. Aortic valve peak gradient measures 8.4 mmHg. Aortic valve area, by VTI measures 1.33 cm. Pulmonic Valve: The pulmonic valve was not well visualized. Pulmonic valve regurgitation is not visualized. Aorta: The aortic root is normal in size and structure. IAS/Shunts: No atrial level shunt detected by color flow Doppler.  LEFT VENTRICLE PLAX 2D LVIDd:         4.40 cm   Diastology LVIDs:         3.00 cm   LV e' medial:    8.05 cm/s LV PW:         0.70 cm   LV E/e' medial:  10.9 LV IVS:        0.70 cm   LV e' lateral:   11.60 cm/s LVOT diam:     1.60 cm   LV E/e' lateral: 7.5 LV SV:         37 LV SV Index:   28 LVOT Area:     2.01 cm  RIGHT VENTRICLE RV Basal diam:  3.20 cm RV S prime:     12.50 cm/s TAPSE (M-mode): 2.1 cm LEFT ATRIUM             Index        RIGHT ATRIUM           Index LA diam:        3.70 cm 2.77 cm/m   RA Area:     11.60 cm LA Vol (A2C):   55.9 ml 41.82 ml/m  RA Volume:   27.50 ml  20.57 ml/m LA Vol (A4C):   36.5 ml 27.30 ml/m LA Biplane Vol: 46.4 ml 34.71 ml/m  AORTIC VALVE AV Area (Vmax):    1.34 cm AV Area (Vmean):   1.26 cm AV Area (VTI):     1.33 cm  AV Vmax:           145.00 cm/s AV Vmean:          99.200 cm/s AV VTI:            0.280 m AV Peak Grad:      8.4 mmHg AV Mean Grad:      5.0 mmHg LVOT Vmax:         96.40  cm/s LVOT Vmean:        62.400 cm/s LVOT VTI:          0.185 m LVOT/AV VTI ratio: 0.66  AORTA Ao Root diam: 2.40 cm MITRAL VALVE MV Area (PHT): 4.49 cm    SHUNTS MV Decel Time: 169 msec    Systemic VTI:  0.18 m MV E velocity: 87.50 cm/s  Systemic Diam: 1.60 cm MV A velocity: 63.40 cm/s MV E/A ratio:  1.38 Rudean Haskell MD Electronically signed by Rudean Haskell MD Signature Date/Time: 03/30/2022/9:12:48 PM    Final       Assessment & Plan    66 y.o. female with pmhx cocaine use, MJ use, tobacco use who presents to the ER with anterior STEMI 03/27/2022.   #STEMI Cont ASA, Atorvastatin, coreg, ticagrelor Echo shows normal LV function.  #HTN Cont above and imdur/hydral  #Acute respiratory failure Resolved.  #NSVT post reperfusion. Continue coreg.  #Polysubstance abuse Cessation encouraged   OK to discharge today.   For questions or updates, please contact [facility] Please consult [email or web address] for contact info under        Signed, Vickie Epley, MD  03/31/2022, 9:17 AM

## 2022-03-31 NOTE — Progress Notes (Signed)
After ambulation downstairs and back to 6E, she complained of heart racing. She was placed back in bed and on telemetry. EKG with sinus rhythm HR 67. Will draw some basic labs. Monitor overnight.   Ledora Bottcher, PA-C 03/31/2022, 1:57 PM [place], [place] [address]

## 2022-03-31 NOTE — Progress Notes (Signed)
RNCM provided patient with bus pass.

## 2022-04-01 ENCOUNTER — Telehealth: Payer: Self-pay

## 2022-04-01 ENCOUNTER — Other Ambulatory Visit (HOSPITAL_COMMUNITY): Payer: Self-pay

## 2022-04-01 DIAGNOSIS — Z95820 Peripheral vascular angioplasty status with implants and grafts: Secondary | ICD-10-CM

## 2022-04-01 DIAGNOSIS — E785 Hyperlipidemia, unspecified: Secondary | ICD-10-CM

## 2022-04-01 DIAGNOSIS — E440 Moderate protein-calorie malnutrition: Secondary | ICD-10-CM

## 2022-04-01 MED ORDER — ISOSORBIDE MONONITRATE ER 30 MG PO TB24
15.0000 mg | ORAL_TABLET | Freq: Every day | ORAL | 11 refills | Status: DC
  Filled 2022-04-01 (×2): qty 15, 30d supply, fill #0

## 2022-04-01 MED ORDER — ATORVASTATIN CALCIUM 80 MG PO TABS
80.0000 mg | ORAL_TABLET | Freq: Every day | ORAL | 3 refills | Status: DC
  Filled 2022-04-01 (×2): qty 90, 90d supply, fill #0

## 2022-04-01 MED ORDER — ATORVASTATIN CALCIUM 80 MG PO TABS
80.0000 mg | ORAL_TABLET | Freq: Every day | ORAL | 3 refills | Status: DC
  Filled 2022-04-01: qty 90, 90d supply, fill #0

## 2022-04-01 MED ORDER — LOPERAMIDE HCL 2 MG PO CAPS
2.0000 mg | ORAL_CAPSULE | Freq: Once | ORAL | Status: AC
  Administered 2022-04-01: 2 mg via ORAL
  Filled 2022-04-01: qty 1

## 2022-04-01 MED ORDER — HYDRALAZINE HCL 25 MG PO TABS
25.0000 mg | ORAL_TABLET | Freq: Three times a day (TID) | ORAL | 11 refills | Status: DC
  Filled 2022-04-01: qty 90, 30d supply, fill #0

## 2022-04-01 MED ORDER — SODIUM CHLORIDE 0.9 % IV BOLUS
500.0000 mL | Freq: Once | INTRAVENOUS | Status: AC
  Administered 2022-04-01: 500 mL via INTRAVENOUS

## 2022-04-01 MED ORDER — HYDRALAZINE HCL 25 MG PO TABS
25.0000 mg | ORAL_TABLET | Freq: Two times a day (BID) | ORAL | Status: DC
  Administered 2022-04-01 – 2022-04-02 (×2): 25 mg via ORAL
  Filled 2022-04-01 (×2): qty 1

## 2022-04-01 MED ORDER — LOPERAMIDE HCL 2 MG PO CAPS: 2.0000 mg | ORAL_CAPSULE | Freq: Four times a day (QID) | ORAL | Status: DC | PRN

## 2022-04-01 MED ORDER — ISOSORBIDE MONONITRATE ER 30 MG PO TB24
15.0000 mg | ORAL_TABLET | Freq: Every day | ORAL | 11 refills | Status: DC
  Filled 2022-04-01: qty 15, 30d supply, fill #0

## 2022-04-01 MED ORDER — NITROGLYCERIN 0.4 MG SL SUBL
0.4000 mg | SUBLINGUAL_TABLET | SUBLINGUAL | 3 refills | Status: DC | PRN
  Filled 2022-04-01: qty 25, 1d supply, fill #0

## 2022-04-01 MED ORDER — TICAGRELOR 90 MG PO TABS
90.0000 mg | ORAL_TABLET | Freq: Two times a day (BID) | ORAL | 11 refills | Status: DC
  Filled 2022-04-01 (×2): qty 60, 30d supply, fill #0

## 2022-04-01 MED ORDER — ASPIRIN 81 MG PO CHEW
81.0000 mg | CHEWABLE_TABLET | Freq: Every day | ORAL | 3 refills | Status: DC
  Filled 2022-04-01: qty 90, 90d supply, fill #0

## 2022-04-01 MED ORDER — HYDRALAZINE HCL 25 MG PO TABS
25.0000 mg | ORAL_TABLET | Freq: Three times a day (TID) | ORAL | 11 refills | Status: DC
  Filled 2022-04-01 (×2): qty 90, 30d supply, fill #0

## 2022-04-01 MED ORDER — CARVEDILOL 6.25 MG PO TABS
6.2500 mg | ORAL_TABLET | Freq: Two times a day (BID) | ORAL | 11 refills | Status: DC
  Filled 2022-04-01: qty 60, 30d supply, fill #0

## 2022-04-01 MED ORDER — CARVEDILOL 6.25 MG PO TABS
6.2500 mg | ORAL_TABLET | Freq: Two times a day (BID) | ORAL | 11 refills | Status: DC
  Filled 2022-04-01 (×2): qty 60, 30d supply, fill #0

## 2022-04-01 MED ORDER — TICAGRELOR 90 MG PO TABS
90.0000 mg | ORAL_TABLET | Freq: Two times a day (BID) | ORAL | 11 refills | Status: DC
  Filled 2022-04-01: qty 60, 30d supply, fill #0

## 2022-04-01 MED ORDER — NITROGLYCERIN 0.4 MG SL SUBL
0.4000 mg | SUBLINGUAL_TABLET | SUBLINGUAL | 3 refills | Status: DC | PRN
  Filled 2022-04-01: qty 25, 7d supply, fill #0

## 2022-04-01 NOTE — Progress Notes (Addendum)
Rounding Note    Patient Name: Cassidy Larson Date of Encounter: 04/01/2022  [place] Cardiologist: Cassidy Chandler, MD   Subjective   Continue to have nausea. Unable to keep anything down. Multiple watery diarrhea this morning. Peri-umbilical tenderness. No SOB. 1 episode of chest pain after stood up this morning, this has subsided.   She says that her abdomen feels better now.  Not complaining as much of loose stools but just feels weak and tired. She spent a long time talking about hardships in her life.  She talked about her living situation and how unsanitary it is.  She says that sometimes she would rather go back to being homeless then living where she is.  (I suspect a lot of the reason for her not wanting to be discharged is that she feels safer here than she has at home.  Inpatient Medications    Scheduled Meds:  aspirin  81 mg Oral Daily   atorvastatin  80 mg Oral Daily   carvedilol  6.25 mg Oral BID WC   enoxaparin (LOVENOX) injection  30 mg Subcutaneous Q24H   feeding supplement  237 mL Oral BID BM   fiber supplement (BANATROL TF)  60 mL Oral BID   hydrALAZINE  25 mg Oral Q8H   isosorbide mononitrate  15 mg Oral Daily   sodium chloride flush  3 mL Intravenous Q12H   ticagrelor  90 mg Oral BID   Continuous Infusions:  sodium chloride Stopped (03/28/22 1057)   sodium chloride     PRN Meds: sodium chloride, acetaminophen, haloperidol lactate, ondansetron (ZOFRAN) IV, mouth rinse, sodium chloride flush   Vital Signs    Vitals:   03/31/22 2025 04/01/22 0014 04/01/22 0500 04/01/22 0530  BP: (!) 108/59 117/73  112/60  Pulse: 65 68  (!) 59  Resp: 12 15  18   Temp: 98.2 F (36.8 C) 97.7 F (36.5 C)  97.8 F (36.6 C)  TempSrc: Oral Oral  Oral  SpO2: 95% 94% 98% 91%  Weight:      Height:       No intake or output data in the 24 hours ending 04/01/22 0808    03/29/2022    6:19 AM 03/28/2022   12:00 AM 02/10/2019    1:50 PM  Last 3  Weights  Weight (lbs) 86 lb 3.2 oz 89 lb 15.2 oz 96 lb  Weight (kg) 39.1 kg 40.8 kg 43.545 kg      Telemetry    NSR with HR 50-60s - Personally Reviewed  ECG    NSR without significant ST-T wave changes - Personally Reviewed  Physical Exam   GEN: No acute distress.  Thin and frail, almost cachectic elderly woman.  Looks much older than stated age.   Neck: No JVD Cardiac: RRR, no murmurs, rubs, or gallops.  Respiratory: Clear to auscultation bilaterally.Nonlabored, good air movement. GI: Soft, nontender, non-distended  MS: No edema; No deformity. Neuro:  Nonfocal  Psych: Normal affect   Labs    High Sensitivity Troponin:   Recent Labs  Lab 03/27/22 2211 03/28/22 0050  TROPONINIHS 11 19,192*     Chemistry Recent Labs  Lab 03/27/22 2211 03/27/22 2317 03/28/22 0557 03/29/22 0632 03/31/22 1416  NA 137   < > 139 137 136  K 4.5   < > 4.6 4.5 4.2  CL 107   < > 111 108 109  CO2 24   < > 22 21* 18*  GLUCOSE 112*   < > 84  145* 93  BUN 43*   < > 34* 23 27*  CREATININE 1.43*   < > 1.21* 1.22* 1.50*  CALCIUM 8.3*   < > 8.3* 8.6* 8.8*  MG  --    < > 2.4 2.3 2.2  PROT 7.1  --   --   --   --   ALBUMIN 2.7*  --   --   --   --   AST 26  --   --   --   --   ALT 16  --   --   --   --   ALKPHOS 57  --   --   --   --   BILITOT 0.7  --   --   --   --   GFRNONAA 41*   < > 50* 50* 39*  ANIONGAP 6   < > 6 8 9    < > = values in this interval not displayed.    Lipids  Recent Labs  Lab 03/27/22 2211  CHOL 165  TRIG 33  HDL 47  LDLCALC 111*  CHOLHDL 3.5    Hematology Recent Labs  Lab 03/27/22 2211 03/27/22 2317 03/28/22 0050 03/28/22 0055 03/28/22 0516 03/31/22 1416  WBC 7.0  --  5.3  --   --  5.4  RBC 3.42*  --  3.70*  --   --  3.80*  HGB 10.7*   < > 11.3* 10.9* 10.2* 11.6*  HCT 32.8*   < > 34.4* 32.0* 30.0* 35.0*  MCV 95.9  --  93.0  --   --  92.1  MCH 31.3  --  30.5  --   --  30.5  MCHC 32.6  --  32.8  --   --  33.1  RDW 14.7  --  14.5  --   --  14.3  PLT  221  --  250  --   --  232   < > = values in this interval not displayed.   Thyroid  Recent Labs  Lab 03/31/22 1416  TSH 0.864    BNPNo results for input(s): "BNP", "PROBNP" in the last 168 hours.  DDimer No results for input(s): "DDIMER" in the last 168 hours.   Radiology    ECHOCARDIOGRAM COMPLETE  Result Date: 03/30/2022    ECHOCARDIOGRAM REPORT   Patient Name:   Cassidy Larson Date of Exam: 03/30/2022 Medical Rec #:  [id number]        Height:       62.0 in Accession #:    [id number]       Weight:       86.2 lb Date of Birth:  [birth year]        BSA:          1.337 m Patient Age:    66 years         BP:           117/71 mmHg Patient Gender: F                HR:           76 bpm. Exam Location:  Inpatient Procedure: 2D Echo, Cardiac Doppler, Color Doppler and Intracardiac            Opacification Agent Indications:    Acute MI  History:        Patient has no prior history of Echocardiogram examinations and  Patient has prior history of Echocardiogram examinations, most                 recent 03/28/2022. CAD; Heart cath 03/27/22.  Sonographer:    Merrie Roof RDCS Referring Phys: [id number] [place]  1. Left ventricular ejection fraction, by estimation, is 55 to 60%. The left ventricle has normal function. The left ventricle demonstrates regional wall motion abnormalities (anteroapical hypokinesis). Left ventricular diastolic parameters were normal.  No LV thrombus  2. Right ventricular systolic function is normal. The right ventricular size is normal. Tricuspid regurgitation signal is inadequate for assessing PA pressure.  3. Left atrial size was mildly dilated.  4. The mitral valve is grossly normal. No evidence of mitral valve regurgitation.  5. The aortic valve is tricuspid. Aortic valve regurgitation is not visualized. No aortic stenosis is present. Comparison(s): Prior images reviewed side by side. This study is interpretable. FINDINGS  Left Ventricle: Left  ventricular ejection fraction, by estimation, is 55 to 60%. The left ventricle has normal function. The left ventricle demonstrates regional wall motion abnormalities. The left ventricular internal cavity size was normal in size. There is no left ventricular hypertrophy. Left ventricular diastolic parameters were normal.  LV Wall Scoring: The apical septal segment, apical anterior segment, and apex are hypokinetic. Right Ventricle: The right ventricular size is normal. Right vetricular wall thickness was not well visualized. Right ventricular systolic function is normal. Tricuspid regurgitation signal is inadequate for assessing PA pressure. Left Atrium: Left atrial size was mildly dilated. Right Atrium: Right atrial size was normal in size. Pericardium: There is no evidence of pericardial effusion. Mitral Valve: The mitral valve is grossly normal. No evidence of mitral valve regurgitation. Tricuspid Valve: The tricuspid valve is normal in structure. Tricuspid valve regurgitation is not demonstrated. No evidence of tricuspid stenosis. Aortic Valve: The aortic valve is tricuspid. Aortic valve regurgitation is not visualized. No aortic stenosis is present. Aortic valve mean gradient measures 5.0 mmHg. Aortic valve peak gradient measures 8.4 mmHg. Aortic valve area, by VTI measures 1.33 cm. Pulmonic Valve: The pulmonic valve was not well visualized. Pulmonic valve regurgitation is not visualized. Aorta: The aortic root is normal in size and structure. IAS/Shunts: No atrial level shunt detected by color flow Doppler.  LEFT VENTRICLE PLAX 2D LVIDd:         4.40 cm   Diastology LVIDs:         3.00 cm   LV e' medial:    8.05 cm/s LV PW:         0.70 cm   LV E/e' medial:  10.9 LV IVS:        0.70 cm   LV e' lateral:   11.60 cm/s LVOT diam:     1.60 cm   LV E/e' lateral: 7.5 LV SV:         37 LV SV Index:   28 LVOT Area:     2.01 cm  RIGHT VENTRICLE RV Basal diam:  3.20 cm RV S prime:     12.50 cm/s TAPSE (M-mode): 2.1 cm  LEFT ATRIUM             Index        RIGHT ATRIUM           Index LA diam:        3.70 cm 2.77 cm/m   RA Area:     11.60 cm LA Vol (A2C):   55.9 ml 41.82 ml/m  RA Volume:  27.50 ml  20.57 ml/m LA Vol (A4C):   36.5 ml 27.30 ml/m LA Biplane Vol: 46.4 ml 34.71 ml/m  AORTIC VALVE AV Area (Vmax):    1.34 cm AV Area (Vmean):   1.26 cm AV Area (VTI):     1.33 cm AV Vmax:           145.00 cm/s AV Vmean:          99.200 cm/s AV VTI:            0.280 m AV Peak Grad:      8.4 mmHg AV Mean Grad:      5.0 mmHg LVOT Vmax:         96.40 cm/s LVOT Vmean:        62.400 cm/s LVOT VTI:          0.185 m LVOT/AV VTI ratio: 0.66  AORTA Ao Root diam: 2.40 cm MITRAL VALVE MV Area (PHT): 4.49 cm    SHUNTS MV Decel Time: 169 msec    Systemic VTI:  0.18 m MV E velocity: 87.50 cm/s  Systemic Diam: 1.60 cm MV A velocity: 63.40 cm/s MV E/A ratio:  1.38 Rudean Haskell MD Electronically signed by Rudean Haskell MD Signature Date/Time: 03/30/2022/9:12:48 PM    Final     Cardiac Studies   Coronary/Graft Acute MI Revascularization  LEFT HEART CATH AND CORONARY ANGIOGRAPHY    Conclusion       Prox RCA to Mid RCA lesion is 100% stenosed.   Mid Cx lesion is 80% stenosed.   Mid LAD lesion is 100% stenosed.   A drug-eluting stent was successfully placed using a SYNERGY XD 2.50X16.   Post intervention, there is a 0% residual stenosis.   Acute anterior STEMI secondary to thrombotic occlusion of the mid LAD Successful PTCA/DES x 1 mid LAD Moderately severe mid Circumflex stenosis Dominant RCA with chronic occlusion of the mid RCA. The distal RCA branches fill from left to right collaterals.  LVEDP=17 mmHg Pt intubated during case secondary to agitation.    Recommendations: Will admit to the ICU. Will ask PCCM team to assist in ventilator management. Will continue Aggrastat infusion for 4 hours. Will give Brilinta 180 mg po x 1 once OG tube in place. Will then continue ASA/Brilinta for one year. It does not appear  that she has a true ASA allergy. Will start high intensity statin. Echo in am. No beta blocker given cocaine abuse. Consider PCI of the Circumflex but this lesion is not critical and it may be best to observe her compliance with medical therapy and cocaine abstinence before placing additional coronary stents.    Coronary Diagrams   Diagnostic Dominance: Right  Intervention     Echo 03/30/2022 1. Left ventricular ejection fraction, by estimation, is 55 to 60%. The  left ventricle has normal function. The left ventricle demonstrates  regional wall motion abnormalities (anteroapical hypokinesis). Left  ventricular diastolic parameters were normal.   No LV thrombus   2. Right ventricular systolic function is normal. The right ventricular  size is normal. Tricuspid regurgitation signal is inadequate for assessing  PA pressure.   3. Left atrial size was mildly dilated.   4. The mitral valve is grossly normal. No evidence of mitral valve  regurgitation.   5. The aortic valve is tricuspid. Aortic valve regurgitation is not  visualized. No aortic stenosis is present.   Comparison(s): Prior images reviewed side by side. This study is  interpretable.    Patient Profile  66 y.o. female with PMH of cocaine use, MJ use and tobacco use who presented with anterior STEMI and underwent DES to mLAD. Patient was intubated during [place] for agitation and increased sedation requirement.   Assessment & Plan    Anterior STEMI - Cath 03/27/2022 DES to midLAD which was previously 100% occluded, 80% mid LCx lesion. CTO of prox to mid RCA. Post procedure, started on ASA and Brilinta. May consider PCI of LCx later if patient is compliant with medical therapy and follow up. - Echo 03/30/2022 EF 55-60%, anteroapical hypokinesis, no significant valve issue.   CAD: See #1. ASA, Brilinta, lipitor, coreg, hydralazine and imdur  HLD: on Lipitor 80mg  daily  CKD stage III: Cr 1.43 --> 1.25 --> 1.21 --> 1.22  --> 1.50. Baseline Cr 1.2-1.5 range. => With creatinine going up to 1.5 today, I will treat with 500 mL bolus.  I think she is not eating and drinking well this is probably leading Szymon dehydration.  Concern for baseline failure to thrive.  Very concerned about her living situation.  Agitation: intubated during LHC for agitation and increased sedation requirement  Polysubstance use: tobacco, marijuana and cocaine  Poor social situation: Education officer, museum saw the patient on 10/14. Patient does not have any family. Got a bus pass. Will try to get [facility] clinic to fill 30 days of Brilinta prior to D/C  Diarrhea: complained of abdominal pain and watery diarrhea reported by nurse. Imodium. Will check with MD to see if need c dif test. No recent abx. Patient says ASA and Lovenox was making it worse. She has a h/o similar symptom on ASA as well. Will stop Lovenox, patient has SCD. Will discuss with MD to see if can do brilinta monotherapy at this time = reasonable to stop Lovenox and aspirin.  Continue aspirin monotherapy.  Discussed using caffeine with Brilinta to help some of her dyspnea.  Hopefully IV fluids will help rehydrate her with her watery stools.  Dispo: Unfortunately, did not get discharged over the weekend for multiple reasons.  Now is having different complaints.  I think some of this is a little bit related to her fear of being discharged and going back to her living situation.  Cannot exclude that she is truly having symptoms, but they seem to be very and not necessarily associated with her MI.  She does say that she lives near stores where she can get her medications, but just does not always feel safe in her living environment. We will try to give her some IV fluids today and reassess her later on this afternoon and hopefully either by this afternoon or tomorrow we can have her discharged.  She does have a bus pass.  She does not have any family-stating that her sisters have all passed the only  friend she had died a few years ago.  We talked about transportation to clinic visits.  She will be on health heart care social work hopefully provide assistance with rotation to and from clinic visits. Will Engage Dr.Tobb.   For questions or updates, please contact [place] Please consult [email or web address] for contact info under        Signed, Almyra Deforest, [place]  04/01/2022, 8:08 AM    ATTENDING ATTESTATION  I have seen, examined and evaluated the patient this morning on rounds along with Almyra Deforest, PA.  After reviewing all the available data and chart, we discussed the patients laboratory, study & physical findings as well as symptoms in detail.  I agree with his findings, examination as well as impression recommendations as per our discussion.    Attending adjustments noted in italics.   Stable from a cardiac standpoint.  Now seems to be noncardiac in nature.  Somewhat varied and atypical.  Complaint today is abdominal pain in the stools.  She is taking in less p.o.  Borderline failure to thrive. She has been ensuring written form, she would not be have this at home. Seems dehydrated on labs.  Hopefully we can reassess her later on today or tomorrow to ensure that she is for discharge.  She has now r been here 3 to 4 days longer than initially anticipated.    Leonie Man, MD, MS Glenetta Hew, M.D., M.S. Interventional Cardiologist  [place]  Pager # [phone] Phone # [phone] [address] [place], [place] [address]

## 2022-04-01 NOTE — Plan of Care (Signed)

## 2022-04-01 NOTE — Telephone Encounter (Signed)
**Note De-identified  Obfuscation** -----  **Note De-Identified  Obfuscation** Message from Ledora Bottcher, [place] sent at 03/30/2022  1:48 PM EDT ----- Pt will need a TOC phone call.  Thanks Angie

## 2022-04-01 NOTE — TOC Transition Note (Signed)
TOC: 6 MEDS TOTAL: READY FOR PT D/C IN TOC PHARM.

## 2022-04-01 NOTE — Progress Notes (Addendum)
CARDIAC REHAB PHASE I    Pt is resting in bed. She is not feeling well this morning. Having loose stools and had cp earlier today. Reviewed education provided on Friday. Pt is very concerned about her housing meeting coming up this Thursday. I will contact social work and case management to see if there is anything we can do to help. Will continue to follow.   [id number]  Vanessa Barbara, RN BSN 04/01/2022 10:56 AM

## 2022-04-01 NOTE — Progress Notes (Signed)
Pt has went to have BM 4 times since midnight. Dr. Eulas Post notified pt is having clear watery stools with marble sized, brown poop present. Pt stated since admission has not had any withdrawal from cocaine noted. Pt is very anxious and talkative at this time and no signs of behavioral issues noted.   Dr. Eulas Post called back after paged received stated will call nurse back. Will follow-up with call back from MD and also pass all information on to oncoming nurse as well.

## 2022-04-01 NOTE — Plan of Care (Signed)
  Problem: Clinical Measurements: Goal: Cardiovascular complication will be avoided Outcome: Progressing   Problem: Nutrition: Goal: Adequate nutrition will be maintained Outcome: Progressing   Problem: Coping: Goal: Level of anxiety will decrease Outcome: Progressing   Problem: Elimination: Goal: Will not experience complications related to urinary retention Outcome: Progressing   Problem: Safety: Goal: Ability to remain free from injury will improve Outcome: Progressing   Problem: Skin Integrity: Goal: Risk for impaired skin integrity will decrease Outcome: Progressing

## 2022-04-01 NOTE — Progress Notes (Addendum)
CSW met with patient at bedside. Patient reports she comes from home alone. CSW received consult for housing,food,transportation,substance use, and utility resources. CSW offered patient resources for area shelters. Patient declined. Patient confirmed plan is to return to her apartment at dc. CSW offered patient food resources. Patient accepted. CSW offered patient community resources guide for [employer] Patient accepted. CSW offered patient outpatient substance use treatment services resources. Patient accepted. Patient reports she will need transportation at dc. CSW provided patient with a bus pass for when she is medically ready for dc.Patient requested clothes when medically ready for dc. CSW will assist on finding patient some clothes at dc from Fish farm manager.All questions answered. No further questions reported at this time.

## 2022-04-01 NOTE — Progress Notes (Signed)
Mobility Specialist - Progress Note   04/01/22 1555  Mobility  Activity Ambulated with assistance in room  Level of Assistance Contact guard assist, steadying assist  Assistive Device None  Distance Ambulated (ft) 20 ft  Activity Response Tolerated well  Mobility Referral Yes  $Mobility charge 1 Mobility   Pt received in bed and requesting to [place] BR. No complaints throughout. Pt was returned to bed with all needs met and bed alarm on.  Larey Seat

## 2022-04-02 ENCOUNTER — Other Ambulatory Visit (HOSPITAL_COMMUNITY): Payer: Self-pay

## 2022-04-02 DIAGNOSIS — E782 Mixed hyperlipidemia: Secondary | ICD-10-CM

## 2022-04-02 LAB — BASIC METABOLIC PANEL
Anion gap: 8 (ref 5–15)
BUN: 25 mg/dL — ABNORMAL HIGH (ref 8–23)
CO2: 20 mmol/L — ABNORMAL LOW (ref 22–32)
Calcium: 8.9 mg/dL (ref 8.9–10.3)
Chloride: 109 mmol/L (ref 98–111)
Creatinine, Ser: 1.47 mg/dL — ABNORMAL HIGH (ref 0.44–1.00)
GFR, Estimated: 40 mL/min — ABNORMAL LOW (ref >60)
Glucose, Bld: 87 mg/dL (ref 70–99)
Potassium: 4.3 mmol/L (ref 3.5–5.1)
Sodium: 137 mmol/L (ref 135–145)

## 2022-04-02 MED ORDER — SERTRALINE HCL 25 MG PO TABS
25.0000 mg | ORAL_TABLET | Freq: Every day | ORAL | 0 refills | Status: DC
  Filled 2022-04-02: qty 30, 30d supply, fill #0

## 2022-04-02 MED ORDER — SERTRALINE HCL 50 MG PO TABS: 25.0000 mg | ORAL_TABLET | Freq: Every day | ORAL | Status: DC

## 2022-04-02 NOTE — TOC Progression Note (Signed)
Transition of Care [facility]) - Progression Note    Patient Details  Name: Cassidy Larson MRN: [id number] Date of Birth: September 04, 1955  [facility]) CM/SW [place], [place] Phone Number: 04/02/2022, 2:41 PM  Clinical Narrative:     CSW spoke with patient at bedside. Patient confirmed plan to return back to her apartment at dc. Patient requested clothes,shoes, and back pack. CSW provided patient with items requested from social work Management consultant. Patient accepted. All questions answered. No further questions reported at this time.        Expected Discharge Plan and Services           Expected Discharge Date: 04/02/22                                     Social Determinants of Health (SDOH) Interventions Food Insecurity Interventions: Other (Comment) (Food resources provided patient accepted) Housing Interventions: Other (Comment) (Pateint declined shelter resources pateint plans to return to her apartment) Transportation Interventions: [employer] Given (CSW provided pateint with bus pass) Utilities Interventions: Other (Comment) (community resources provided patient accepted)  Readmission Risk Interventions     No data to display

## 2022-04-02 NOTE — Plan of Care (Signed)
  Problem: Clinical Measurements: Goal: Diagnostic test results will improve Outcome: Progressing Goal: Cardiovascular complication will be avoided Outcome: Progressing   Problem: Activity: Goal: Risk for activity intolerance will decrease Outcome: Progressing   Problem: Nutrition: Goal: Adequate nutrition will be maintained Outcome: Progressing   Problem: Elimination: Goal: Will not experience complications related to urinary retention Outcome: Progressing   Problem: Pain Managment: Goal: General experience of comfort will improve Outcome: Progressing   Problem: Safety: Goal: Ability to remain free from injury will improve Outcome: Progressing

## 2022-04-02 NOTE — Progress Notes (Signed)
    Patient seen and evaluated today with plans for discharge.  She continues to have psychosocial concerns.  She feels weak tired and seems very depressed.  I think a lot of this is just fear of discharge.  Plan is ambulate in the hallway today.  She does okay then would try to discharge her on low-dose Zoloft.  Potentially look into some home health assistance for her although social worker had not mentioned anything about that. It is somewhat concerning for her to be discharged to a somewhat unsafe home environment where she is by herself.   Glenetta Hew, MD

## 2022-04-02 NOTE — Plan of Care (Signed)

## 2022-04-02 NOTE — Progress Notes (Signed)
CARDIAC REHAB PHASE I   Pt is resting in bed. States she is feeling better today, however to tired for walk right now. States I just need to sleep I'll walk later. Pt asked about clothing for home. Per note Ebony Hail with social work is aware of need and looking for clothing for pt. Will return to offer walk later today as time permits.    [id number]  Vanessa Barbara, RN BSN 04/02/2022 10:01 AM

## 2022-04-02 NOTE — Discharge Summary (Cosign Needed)
Discharge Summary    Patient ID: Cassidy Larson MRN: [id number]; DOB: 15-Jul-1955  Admit date: 03/27/2022 Discharge date: 04/02/2022  PCP:  Kerin Perna, NP   [place] Prov[facility] Cardiologist:  Lauree Chandler, MD     Discharge Diagnoses    Principal Problem:   Acute ST elevation myocardial infarction (STEMI) due to occlusion of left anterior descending (LAD) coronary artery [facility]) Active Problems:   On mechanically assisted ventilation ([place]) extubated 03/28/22    Acute respiratory failure with hypoxia (HCC)   Cocaine abuse ([place])   Tobacco abuse   CAD in native artery residual post LAD stent, 100% RCA and 80% LCx.    S/P angioplasty with stent to mLAD 03/27/22   HLD (hyperlipidemia)   Malnutrition of moderate degree    Diagnostic Studies/Procedures   Cath: 03/27/22  Conclusion       Prox RCA to Mid RCA lesion is 100% stenosed.   Mid Cx lesion is 80% stenosed.   Mid LAD lesion is 100% stenosed.   A drug-eluting stent was successfully placed using a SYNERGY XD 2.50X16.   Post intervention, there is a 0% residual stenosis.   Acute anterior STEMI secondary to thrombotic occlusion of the mid LAD Successful PTCA/DES x 1 mid LAD Moderately severe mid Circumflex stenosis Dominant RCA with chronic occlusion of the mid RCA. The distal RCA branches fill from left to right collaterals.  LVEDP=17 mmHg Pt intubated during case secondary to agitation.    Recommendations: Will admit to the ICU. Will ask PCCM team to assist in ventilator management. Will continue Aggrastat infusion for 4 hours. Will give Brilinta 180 mg po x 1 once OG tube in place. Will then continue ASA/Brilinta for one year. It does not appear that she has a true ASA allergy. Will start high intensity statin. Echo in am. No beta blocker given cocaine abuse. Consider PCI of the Circumflex but this lesion is not critical and it may be best to observe her compliance with medical therapy and  cocaine abstinence before placing additional coronary stents.    Coronary Diagrams   Diagnostic Dominance: Right  Intervention        Echo 03/30/2022 1. Left ventricular ejection fraction, by estimation, is 55 to 60%. The  left ventricle has normal function. The left ventricle demonstrates  regional wall motion abnormalities (anteroapical hypokinesis). Left  ventricular diastolic parameters were normal.   No LV thrombus   2. Right ventricular systolic function is normal. The right ventricular  size is normal. Tricuspid regurgitation signal is inadequate for assessing  PA pressure.   3. Left atrial size was mildly dilated.   4. The mitral valve is grossly normal. No evidence of mitral valve  regurgitation.   5. The aortic valve is tricuspid. Aortic valve regurgitation is not  visualized. No aortic stenosis is present.   Comparison(s): Prior images reviewed side by side. This study is  interpretable.  _____________   History of Present Illness     Cassidy Larson is a 66 y.o. female with  prior cocaine use, THC use, tobacco use, presented to ER with crushing chest pain that started 4 hours piror to admit.  She had taken cocaine day of admit and later developed the severe chest pain, left sided substernal.  She did take a NTG but did not help.  No prior cardiac hx and no medications at home.  + tobacco use and cocaine use.  She stated ASA would make her vomit but here she has been  able to take.    In ER pain was 10/10 chest pain with radiation to her back.  BP was stable she was given ASSA 325 and IV heparin.  EKG with ST elevations in precordial leads V2-V4. She was taken to cath lab emergently for STEMI.     In the cath lab she rec'd DES to [place] and was intubated due to increasing sedation requirements and agitation on the table.   Hospital Course     Consultants: PCCM   Anterior STEMI CAD -- Cath 03/27/2022 DES to midLAD which was previously 100% occluded, 80% mid LCx  lesion. CTO of prox to mid RCA. Post procedure, started on ASA and Brilinta. May consider PCI of LCx later if patient is compliant with medical therapy and follow up. Seen by cardiac rehab. -- Echo 03/30/2022 EF 55-60%, anteroapical hypokinesis, no significant valve issue.  -- continue ASA, Brilinta, lipitor, coreg, hydralazine and imdur   HLD -- LDL 111, HDL 47 -- now on Lipitor $RemoveBe'80mg'TPdIgNFPl$  daily -- will need FLP/LFTs in 8 weeks    CKD stage III -- Cr 1.43 --> 1.25 --> 1.21 --> 1.22 --> 1.50. Baseline Cr 1.2-1.5 range, improved to 1.47 prior to discharge    Agitation -- intubated during LHC for agitation and increased sedation requirement   Polysubstance use -- tobacco, marijuana and cocaine   Poor social situation -- social worker saw the patient on 10/14. Patient does not have any family. Got a bus pass.  -- medications filled through [facility] prior to discharge   Difficult situation.  She was not discharged yesterday because of feeling weak and lethargic and creatinine being up little bit.  Creatinine is now stable with minor improvement.  She seems to be very very fearful of leaving the hospital and going out to her apartment by herself.  She is complaining of feeling lethargic and weak and just scared.  She clearly has some psychosocial issues that are affecting her care.  Very sad home situation. =>, We will write a prescription for low-dose Zoloft hopefully this will help alleviate some of her stress and anxiety for going home.  I stated the only other option for her would be discharged to a nursing facility but she does not want to do that either.  I think she is probably best to go home, maybe we could arrange some home health. Will help to discharge her today.  We will get her up to ambulate.  If stable, then can be discharged.  Otherwise we will need to potentially consider short-term rehab.    General: Thin, frail older female. Appears older than stated age Head: Normocephalic,  atraumatic.  Neck: Supple without bruits, JVD. Lungs:  Resp regular and unlabored, CTA. Heart: RRR, S1, S2, no S3, S4, or murmur; no rub. Abdomen: Soft, non-tender, non-distended with normoactive bowel sounds. No hepatomegaly. No rebound/guarding. No obvious abdominal masses. Extremities: No clubbing, cyanosis, edema. Distal pedal pulses are 2+ bilaterally.  Neuro: Alert and oriented X 3. Moves all extremities spontaneously. Psych: Normal affect.   Did the patient have an acute coronary syndrome (MI, NSTEMI, STEMI, etc) this admission?:  Yes                               AHA/ACC Clinical Performance & Quality Measures: Aspirin prescribed? - Yes ADP Receptor Inhibitor (Plavix/Clopidogrel, Brilinta/Ticagrelor or Effient/Prasugrel) prescribed (includes medically managed patients)? - Yes Beta Blocker prescribed? - Yes High Intensity Statin (Lipitor  40-'80mg'$  or Crestor 20-$RemoveBefor'40mg'ajhFpwxoxLEe$ ) prescribed? - Yes EF assessed during THIS hospitalization? - Yes For EF <40%, was ACEI/ARB prescribed? - Not Applicable (EF >/= 12%) For EF <40%, Aldosterone Antagonist (Spironolactone or Eplerenone) prescribed? - Not Applicable (EF >/= 24%) Cardiac Rehab Phase II ordered (including medically managed patients)? - Yes       The patient will be scheduled for a TOC follow up appointment in 10-14 days.  A message has been sent to the [facility] and Scheduling Pool at the office where the patient should be seen for follow up.  _____________  Discharge Vitals Blood pressure (!) 99/51, pulse 66, temperature 98.1 F (36.7 C), temperature source Oral, resp. rate 16, height $RemoveBe'5\' 2"'gxXeCWPIp$  (1.575 m), weight 39.1 kg, SpO2 100 %.  Filed Weights   03/28/22 0000 03/29/22 0619  Weight: 40.8 kg 39.1 kg    Labs & Radiologic Studies    CBC Recent Labs    03/31/22 1416  WBC 5.4  HGB 11.6*  HCT 35.0*  MCV 92.1  PLT 825   Basic Metabolic Panel Recent Labs    03/31/22 1416 04/02/22 0859  NA 136 137  K 4.2 4.3  CL 109 109  CO2  18* 20*  GLUCOSE 93 87  BUN 27* 25*  CREATININE 1.50* 1.47*  CALCIUM 8.8* 8.9  MG 2.2  --    Liver Function Tests No results for input(s): "AST", "ALT", "ALKPHOS", "BILITOT", "PROT", "ALBUMIN" in the last 72 hours. No results for input(s): "LIPASE", "AMYLASE" in the last 72 hours. High Sensitivity Troponin:   Recent Labs  Lab 03/27/22 2211 03/28/22 0050  TROPONINIHS 11 19,192*    BNP Invalid input(s): "POCBNP" D-Dimer No results for input(s): "DDIMER" in the last 72 hours. Hemoglobin A1C No results for input(s): "HGBA1C" in the last 72 hours. Fasting Lipid Panel No results for input(s): "CHOL", "HDL", "LDLCALC", "TRIG", "CHOLHDL", "LDLDIRECT" in the last 72 hours. Thyroid Function Tests Recent Labs    03/31/22 1416  TSH 0.864   _____________  ECHOCARDIOGRAM COMPLETE  Result Date: 03/30/2022    ECHOCARDIOGRAM REPORT   Patient Name:   Cassidy Larson Date of Exam: 03/30/2022 Medical Rec #:  [id number]        Height:       62.0 in Accession #:    [id number]       Weight:       86.2 lb Date of Birth:  12/14/1955        BSA:          1.337 m Patient Age:    64 years         BP:           117/71 mmHg Patient Gender: F                HR:           76 bpm. Exam Location:  Inpatient Procedure: 2D Echo, Cardiac Doppler, Color Doppler and Intracardiac            Opacification Agent Indications:    Acute MI  History:        Patient has no prior history of Echocardiogram examinations and                 Patient has prior history of Echocardiogram examinations, most                 recent 03/28/2022. CAD; Heart cath 03/27/22.  Sonographer:    Merrie Roof RDCS Referring Phys: [id number] [facility]  NICOLE [place] IMPRESSIONS  1. Left ventricular ejection fraction, by estimation, is 55 to 60%. The left ventricle has normal function. The left ventricle demonstrates regional wall motion abnormalities (anteroapical hypokinesis). Left ventricular diastolic parameters were normal.  No LV thrombus  2. Right  ventricular systolic function is normal. The right ventricular size is normal. Tricuspid regurgitation signal is inadequate for assessing PA pressure.  3. Left atrial size was mildly dilated.  4. The mitral valve is grossly normal. No evidence of mitral valve regurgitation.  5. The aortic valve is tricuspid. Aortic valve regurgitation is not visualized. No aortic stenosis is present. Comparison(s): Prior images reviewed side by side. This study is interpretable. FINDINGS  Left Ventricle: Left ventricular ejection fraction, by estimation, is 55 to 60%. The left ventricle has normal function. The left ventricle demonstrates regional wall motion abnormalities. The left ventricular internal cavity size was normal in size. There is no left ventricular hypertrophy. Left ventricular diastolic parameters were normal.  LV Wall Scoring: The apical septal segment, apical anterior segment, and apex are hypokinetic. Right Ventricle: The right ventricular size is normal. Right vetricular wall thickness was not well visualized. Right ventricular systolic function is normal. Tricuspid regurgitation signal is inadequate for assessing PA pressure. Left Atrium: Left atrial size was mildly dilated. Right Atrium: Right atrial size was normal in size. Pericardium: There is no evidence of pericardial effusion. Mitral Valve: The mitral valve is grossly normal. No evidence of mitral valve regurgitation. Tricuspid Valve: The tricuspid valve is normal in structure. Tricuspid valve regurgitation is not demonstrated. No evidence of tricuspid stenosis. Aortic Valve: The aortic valve is tricuspid. Aortic valve regurgitation is not visualized. No aortic stenosis is present. Aortic valve mean gradient measures 5.0 mmHg. Aortic valve peak gradient measures 8.4 mmHg. Aortic valve area, by VTI measures 1.33 cm. Pulmonic Valve: The pulmonic valve was not well visualized. Pulmonic valve regurgitation is not visualized. Aorta: The aortic root is normal  in size and structure. IAS/Shunts: No atrial level shunt detected by color flow Doppler.  LEFT VENTRICLE PLAX 2D LVIDd:         4.40 cm   Diastology LVIDs:         3.00 cm   LV e' medial:    8.05 cm/s LV PW:         0.70 cm   LV E/e' medial:  10.9 LV IVS:        0.70 cm   LV e' lateral:   11.60 cm/s LVOT diam:     1.60 cm   LV E/e' lateral: 7.5 LV SV:         37 LV SV Index:   28 LVOT Area:     2.01 cm  RIGHT VENTRICLE RV Basal diam:  3.20 cm RV S prime:     12.50 cm/s TAPSE (M-mode): 2.1 cm LEFT ATRIUM             Index        RIGHT ATRIUM           Index LA diam:        3.70 cm 2.77 cm/m   RA Area:     11.60 cm LA Vol (A2C):   55.9 ml 41.82 ml/m  RA Volume:   27.50 ml  20.57 ml/m LA Vol (A4C):   36.5 ml 27.30 ml/m LA Biplane Vol: 46.4 ml 34.71 ml/m  AORTIC VALVE AV Area (Vmax):    1.34 cm AV Area (Vmean):   1.26 cm AV Area (  VTI):     1.33 cm AV Vmax:           145.00 cm/s AV Vmean:          99.200 cm/s AV VTI:            0.280 m AV Peak Grad:      8.4 mmHg AV Mean Grad:      5.0 mmHg LVOT Vmax:         96.40 cm/s LVOT Vmean:        62.400 cm/s LVOT VTI:          0.185 m LVOT/AV VTI ratio: 0.66  AORTA Ao Root diam: 2.40 cm MITRAL VALVE MV Area (PHT): 4.49 cm    SHUNTS MV Decel Time: 169 msec    Systemic VTI:  0.18 m MV E velocity: 87.50 cm/s  Systemic Diam: 1.60 cm MV A velocity: 63.40 cm/s MV E/A ratio:  1.38 Rudean Haskell MD Electronically signed by Rudean Haskell MD Signature Date/Time: 03/30/2022/9:12:48 PM    Final    ECHOCARDIOGRAM COMPLETE  Result Date: 03/28/2022    ECHOCARDIOGRAM REPORT   Patient Name:   Cassidy Larson Date of Exam: 03/28/2022 Medical Rec #:  [id number]        Height:       62.0 in Accession #:    [id number]       Weight:       89.9 lb Date of Birth:  1956-03-08        BSA:          1.361 m Patient Age:    18 years         BP:           125/67 mmHg Patient Gender: F                HR:           59 bpm. Exam Location:  Inpatient Procedure: 2D Echo, Cardiac  Doppler and Color Doppler Indications:    CAD Native Vessel I25.10  History:        Patient has no prior history of Echocardiogram examinations. CAD                 and STEMI; Risk Factors:Hypertension.  Sonographer:    Greer Pickerel Referring Phys: Burnell Blanks  Sonographer Comments: No apical window, no parasternal window, suboptimal subcostal window, Technically challenging study due to limited acoustic windows and Technically difficult study due to poor echo windows. Image acquisition challenging due to respiratory motion and Image acquisition challenging due to COPD. IMPRESSIONS  1. Images are not interpretable.  2. Left ventricular endocardial border not optimally defined to evaluate regional wall motion. Left ventricular diastolic function could not be evaluated.  3. Right ventricular systolic function was not well visualized. The right ventricular size is not well visualized.  4. The mitral valve was not assessed. No evidence of mitral valve regurgitation. No evidence of mitral stenosis. Severe mitral annular calcification.  5. The aortic valve was not assessed. Aortic valve regurgitation is not visualized. No aortic stenosis is present.  6. The inferior vena cava is dilated in size with <50% respiratory variability, suggesting right atrial pressure of 15 mmHg. FINDINGS  Left Ventricle: Left ventricular endocardial border not optimally defined to evaluate regional wall motion. There is no left ventricular hypertrophy. Left ventricular diastolic function could not be evaluated. Right Ventricle: The right ventricular size is not well visualized. Right vetricular wall thickness was not assessed. Right  ventricular systolic function was not well visualized. Left Atrium: Left atrial size was not assessed. Right Atrium: Right atrial size was not assessed. Pericardium: The pericardium was not assessed. Mitral Valve: The mitral valve was not assessed. Severe mitral annular calcification. No evidence of  mitral valve regurgitation. No evidence of mitral valve stenosis. Tricuspid Valve: The tricuspid valve is not assessed. Tricuspid valve regurgitation is not demonstrated. No evidence of tricuspid stenosis. Aortic Valve: The aortic valve was not assessed. Aortic valve regurgitation is not visualized. No aortic stenosis is present. Pulmonic Valve: The pulmonic valve was not assessed. Pulmonic valve regurgitation is not visualized. No evidence of pulmonic stenosis. Aorta: The aortic root was not well visualized. Venous: The inferior vena cava is dilated in size with less than 50% respiratory variability, suggesting right atrial pressure of 15 mmHg. IAS/Shunts: No atrial level shunt detected by color flow Doppler. Skeet Latch MD Electronically signed by Skeet Latch MD Signature Date/Time: 03/28/2022/7:28:05 PM    Final    DG Abd 1 View  Result Date: 03/28/2022 CLINICAL DATA:  OG tube placement EXAM: ABDOMEN - 1 VIEW COMPARISON:  None Available. FINDINGS: Enteric tube terminates in the proximal gastric body. IMPRESSION: Enteric tube terminates in the proximal gastric body. Electronically Signed   By: Julian Hy M.D.   On: 03/28/2022 00:15   DG CHEST PORT 1 VIEW  Result Date: 03/28/2022 CLINICAL DATA:  ETT placement EXAM: PORTABLE CHEST 1 VIEW COMPARISON:  01/19/2022 FINDINGS: Endotracheal tube terminates 4 cm above the carina. Mild bibasilar opacities, likely atelectasis. No pleural effusion or pneumothorax. The heart normal in size.  Thoracic aortic atherosclerosis. Enteric tube courses into the proximal stomach. IMPRESSION: Endotracheal tube terminates 4 cm above the carina. Enteric tube courses into the proximal stomach. Electronically Signed   By: Julian Hy M.D.   On: 03/28/2022 00:14   CARDIAC CATHETERIZATION  Addendum Date: 03/27/2022     Prox RCA to Mid RCA lesion is 100% stenosed.   Mid Cx lesion is 80% stenosed.   Mid LAD lesion is 100% stenosed.   A drug-eluting stent was  successfully placed using a SYNERGY XD 2.50X16.   Post intervention, there is a 0% residual stenosis. Acute anterior STEMI secondary to thrombotic occlusion of the mid LAD Successful PTCA/DES x 1 mid LAD Moderately severe mid Circumflex stenosis Dominant RCA with chronic occlusion of the mid RCA. The distal RCA branches fill from left to right collaterals. LVEDP=17 mmHg Pt intubated during case secondary to agitation. Recommendations: Will admit to the ICU. Will ask PCCM team to assist in ventilator management. Will continue Aggrastat infusion for 4 hours. Will give Brilinta 180 mg po x 1 once OG tube in place. Will then continue ASA/Brilinta for one year. It does not appear that she has a true ASA allergy. Will start high intensity statin. Echo in am. No beta blocker given cocaine abuse. Consider PCI of the Circumflex but this lesion is not critical and it may be best to observe her compliance with medical therapy and cocaine abstinence before placing additional coronary stents.   Result Date: 03/27/2022   Prox RCA to Mid RCA lesion is 100% stenosed.   Mid Cx lesion is 80% stenosed.   Mid LAD lesion is 100% stenosed.   A drug-eluting stent was successfully placed using a SYNERGY XD 2.50X16.   Post intervention, there is a 0% residual stenosis. Acute anterior STEMI secondary to thrombotic occlusion of the mid LAD Successful PTCA/DES x 1 mid LAD Moderately severe mid Circumflex  stenosis Dominant RCA with chronic occlusion of the mid RCA. The distal RCA branches fill from left to right collaterals. LVEDP=17 mmHg Pt intubated during case secondary to agitation. Recommendations: Will admit to the ICU. Will ask PCCM team to assist in ventilator management. Will continue Aggrastat infusion for 4 hours. Will give Brilinta 180 mg po x 1 once OG tube in place. Will then continue ASA/Brilinta for one year. It does not appear that she has a true ASA allergy. Will start high intensity statin. Echo in am. No beta blocker  given cocaine abuse. Consider PCI of the Circumflex but this lesion is not critical and it may be best to observe her compliance with medical therapy and cocaine abstinence before placing additional coronary stents.   Disposition   Pt is being discharged home today in good condition.  Follow-up Plans & Appointments     Follow-up Information     Elgie Collard, PA-C Follow up on 04/12/2022.   Specialty: Cardiology Why: at 10am for your follow up appt with Dr. Alyse [place]' [place] information: [place] [id number] [phone]                Discharge Instructions     Amb Referral to Cardiac Rehabilitation   Complete by: As directed    Diagnosis:  Coronary Stents STEMI     After initial evaluation and assessments completed: Virtual Based Care may be provided alone or in conjunction with Phase 2 Cardiac Rehab based on patient barriers.: Yes   Intensive Cardiac Rehabilitation (ICR) [place] location only OR Traditional Cardiac Rehabilitation (TCR) *If criteria for ICR are not met will enroll in TCR [facility] only): Yes   Call MD for:  difficulty breathing, headache or visual disturbances   Complete by: As directed    Call MD for:  persistant dizziness or light-headedness   Complete by: As directed    Call MD for:  redness, tenderness, or signs of infection (pain, swelling, redness, odor or green/yellow discharge around incision site)   Complete by: As directed    Diet - low sodium heart healthy   Complete by: As directed    Discharge instructions   Complete by: As directed    PLEASE DO NOT [place]!!!!! Also keep a log of you blood pressures and bring back to your follow up appt. Please call the office with any questions.   Patients taking blood thinners should generally stay away from medicines like ibuprofen, Advil, Motrin, naproxen, and Aleve due to risk of stomach bleeding. You may take Tylenol as directed or talk to your  primary doctor about alternatives.   PLEASE ENSURE THAT YOU DO NOT RUN OUT OF YOUR BRILINTA. This medication is very important to remain on for at least one year. IF you have issues obtaining this medication due to cost please CALL the office 3-5 business days prior to running out in order to prevent missing doses of this medication.   Increase activity slowly   Complete by: As directed         Discharge Medications   Allergies as of 04/02/2022       Reactions   Aspirin Other (See Comments)   Makes sick on stomach   Latex Other (See Comments)   unspecified        Medication List     TAKE these medications    acetaminophen 500 MG tablet Commonly known as: TYLENOL Take 500 mg by mouth  every 6 (six) hours as needed for mild pain or moderate pain.   atorvastatin 80 MG tablet Commonly known as: LIPITOR Take 1 tablet (80 mg total) by mouth daily.   Brilinta 90 MG Tabs tablet Generic drug: ticagrelor Take 1 tablet (90 mg total) by mouth 2 (two) times daily.   carvedilol 6.25 MG tablet Commonly known as: COREG Take 1 tablet (6.25 mg total) by mouth 2 (two) times daily with a meal.   hydrALAZINE 25 MG tablet Commonly known as: APRESOLINE Take 1 tablet (25 mg total) by mouth every 8 (eight) hours.   isosorbide mononitrate 30 MG 24 hr tablet Commonly known as: IMDUR Take 0.5 tablets (15 mg total) by mouth daily.   nitroGLYCERIN 0.4 MG SL tablet Commonly known as: Nitrostat Place 1 tablet (0.4 mg total) under the tongue every 5 (five) minutes as needed for chest pain.   sertraline 25 MG tablet Commonly known as: ZOLOFT Take 1 tablet (25 mg total) by mouth daily.           Outstanding Labs/Studies   FLP/LFTs in 8 weeks BMET at follow up  Duration of Discharge Encounter   Greater than 30 minutes including physician time.  Signed, Reino Bellis, NP 04/02/2022, 2:26 PM  ATTENDING ATTESTATION  Patient seen and evaluated today with plans for discharge.   She continues to have psychosocial concerns.  She feels weak tired and seems very depressed.   I think a lot of this is just fear of discharge.   Plan is ambulate in the hallway today.  She does okay then would try to discharge her on low-dose Zoloft.  Potentially look into some home health assistance for her although social worker had not mentioned anything about that. It is somewhat concerning for her to be discharged to a somewhat unsafe home environment where she is by herself.   I agree with the findings, examination, recommendations noted in the summary above.  Clinically she is ready for discharge, and she was able to walk up and down the hallway shortly after I saw her and did well.  Stable for discharge.   Leonie Man, MD, MS Glenetta Hew, M.D., M.S. Interventional Cardiologist  [place]  Pager # [phone] Phone # [phone] [address] [place], [place] [address]

## 2022-04-02 NOTE — Progress Notes (Signed)
Occupational Therapy Treatment Patient Details Name: Cassidy Larson MRN: [id number] DOB: 05/04/1956 Today's Date: 04/02/2022   History of present illness 66 yo admitted 03/27/22 after found on street by EMS with crushing chest pain, STEMI taken to cath lab with PCI and stent placed. Intubated in cath lab, extubated 10/12. PMhx: cocaine and tobacco abuse, anxiety, CKD, HTN, Lupus   OT comments  Patient received in supine and agreeable to OT session. Patient able to get to EOB without assistance and transferred to recliner without an assistive device. Patient able to doff socks with reacher and verbal cues an donn socks with sock aide with min assist to place sock on sock aide and verbal cues for use. Patient able to take self to bathroom and perform grooming standing at sink with supervision.  Patient making good progress but continues to require instructions for AE use. Acute OT to continue to follow.    Recommendations for follow up therapy are one component of a multi-disciplinary discharge planning process, led by the attending physician.  Recommendations may be updated based on patient status, additional functional criteria and insurance authorization.    Follow Up Recommendations  No OT follow up    Assistance Recommended at Discharge PRN  Patient can return home with the following  Assist for transportation   Equipment Recommendations  None recommended by OT    Recommendations for Other Services      Precautions / Restrictions Precautions Precautions: Fall;Other (comment) (right wrist cath) Restrictions Weight Bearing Restrictions: No       Mobility Bed Mobility Overal bed mobility: Needs Assistance Bed Mobility: Supine to Sit     Supine to sit: Supervision, HOB elevated     General bed mobility comments: supervision for safety    Transfers Overall transfer level: Needs assistance Equipment used: None Transfers: Sit to/from Stand Sit to Stand: Supervision            General transfer comment: able to ambulate to bathroom for toilet transfers     Balance Overall balance assessment: Needs assistance Sitting-balance support: No upper extremity supported Sitting balance-Leahy Scale: Good     Standing balance support: No upper extremity supported Standing balance-Leahy Scale: Fair Standing balance comment: able to stand at sink for grooming without UE support                           ADL either performed or assessed with clinical judgement   ADL Overall ADL's : Needs assistance/impaired     Grooming: Wash/dry hands;Wash/dry face;Supervision/safety;Standing Grooming Details (indicate cue type and reason): at sink             Lower Body Dressing: Minimal assistance;Sitting/lateral leans Lower Body Dressing Details (indicate cue type and reason): adaptive equipment use for LB dressing with min assist to place sock on sock aide Toilet Transfer: Supervision/safety;Ambulation   Toileting- Clothing Manipulation and Hygiene: Modified independent;Sit to/from stand         General ADL Comments: education on AE use for LB dressing    Extremity/Trunk Assessment              Vision       Perception     Praxis      Cognition Arousal/Alertness: Awake/alert Behavior During Therapy: WFL for tasks assessed/performed Overall Cognitive Status: Within Functional Limits for tasks assessed  General Comments: difficulty recalling adaptive equipment use for LB dressing        Exercises      Shoulder Instructions       General Comments      Pertinent Vitals/ Pain       Pain Assessment Pain Assessment: Faces Faces Pain Scale: Hurts a little bit Pain Location: BLE Pain Descriptors / Indicators: Sore Pain Intervention(s): Limited activity within patient's tolerance, Monitored during session, Repositioned  Home Living                                           Prior Functioning/Environment              Frequency  Min 2X/week        Progress Toward Goals  OT Goals(current goals can now be found in the care plan section)  Progress towards OT goals: Progressing toward goals  Acute Rehab OT Goals OT Goal Formulation: With patient Time For Goal Achievement: 04/12/22 Potential to Achieve Goals: Good ADL Goals Additional ADL Goal #1: Pt will complete basic ADLs modified independently. Additional ADL Goal #2: Pt will be knowledgable in use of AE for accessing R LE.  Plan Discharge plan remains appropriate    Co-evaluation                 AM-PAC OT "6 Clicks" Daily Activity     Outcome Measure   Help from another person eating meals?: None Help from another person taking care of personal grooming?: A Little Help from another person toileting, which includes using toliet, bedpan, or urinal?: A Little Help from another person bathing (including washing, rinsing, drying)?: A Little Help from another person to put on and taking off regular upper body clothing?: A Little Help from another person to put on and taking off regular lower body clothing?: A Little 6 Click Score: 19    End of Session Equipment Utilized During Treatment: Other (comment) (adaptive equipment for LB dressign)  OT Visit Diagnosis: Muscle weakness (generalized) (M62.81)   Activity Tolerance Patient tolerated treatment well   Patient Left in chair;with call bell/phone within reach   Nurse Communication Mobility status        Time: 4128-7867 OT Time Calculation (min): 27 min  Charges: OT General Charges $OT Visit: 1 Visit OT Treatments $Self Care/Home Management : 23-37 mins  Lodema Hong, [place]  Office [place] 04/02/2022, 2:06 PM

## 2022-04-02 NOTE — Progress Notes (Signed)
Mobility Specialist - Progress Note   04/02/22 1041  Mobility  Activity Ambulated with assistance to bathroom  Level of Assistance Contact guard assist, steadying assist  Assistive Device None  Distance Ambulated (ft) 20 ft  Activity Response Tolerated well  Mobility Referral Yes  $Mobility charge 1 Mobility   Pt received in bed and requesting to use BR. Pt declined futher ambulation d/t tiredness. Pt was returned to bed with all needs met.  Larey Seat

## 2022-04-02 NOTE — Progress Notes (Signed)
Mobility Specialist - Progress Note   04/02/22 1200  Mobility  Activity Transferred from chair to bed  Level of Assistance Standby assist, set-up cues, supervision of patient - no hands on  Assistive Device None  Activity Response Tolerated well  Mobility Referral Yes  $Mobility charge 1 Mobility   Pt requesting to get back to bed. No complaints throughout. Pt left in bed with all needs met and bed alarm on.  Larey Seat

## 2022-04-03 ENCOUNTER — Ambulatory Visit: Payer: Medicaid Other | Admitting: Physician Assistant

## 2022-04-03 NOTE — Telephone Encounter (Signed)
**Note De-Identified  Obfuscation** Transition Care Management Unsuccessful Follow-up Telephone Call  Date of discharge and from where:  04/02/2022 from [facility].  Attempts:  1st Attempt  Reason for unsuccessful TCM follow-up call:  Missing or invalid number The only phone number listed in the pts chart to contact her at is [phone] (M); [phone] (H)  -This phone number is [name] and the receptionist there stated that I am the 2nd person to call them today requesting to s/w the pt but they have no information on her or where to contact her. -No DPR on file. -The pt does not have a MYCHART account.  I will call this number again tomorrow in hopes of reaching the pt.

## 2022-04-04 NOTE — Telephone Encounter (Signed)
**Note De-Identified  Obfuscation** Transition Care Management Unsuccessful Follow-up Telephone Call  Date of discharge and from where:  04/02/2022 from [facility].  Attempts:  2nd Attempt  Reason for unsuccessful TCM follow-up call:  Missing or invalid number [phone] (M); [phone] (H)  I called and was again advised by the receptionist that I am calling [name] and they have no information about this pt or how to reach her. -No DPR on file. -The pt does not have a MYCHART account.

## 2022-04-05 NOTE — Telephone Encounter (Signed)
**Note De-Identified  Obfuscation** Transition Care Management Unsuccessful Follow-up Telephone Call  Date of discharge and from where: 04/02/2022 from [facility]  Attempts:  3rd Attempt  Reason for unsuccessful TCM follow-up call:  Missing or invalid number

## 2022-04-10 ENCOUNTER — Encounter (HOSPITAL_COMMUNITY): Payer: Self-pay

## 2022-04-10 ENCOUNTER — Telehealth (HOSPITAL_COMMUNITY): Payer: Self-pay

## 2022-04-10 NOTE — Telephone Encounter (Signed)
Tried calling pt from the number on file [phone]. That number is associated with [name] and has no dealings with the pt. The receptionist stated that she has been getting call after call for the pt and that she doesn't know why. I explained that this phone number was listed as a point of contact but I advised her that I would remove the number from the account. The number has been removed from pt's account. Pt's profile needs to be updated.

## 2022-04-11 NOTE — Progress Notes (Signed)
Office Visit    Patient Name: Cassidy Larson Date of Encounter: 04/11/2022  PCP:  Kerin Perna, NP   [place]  Cardiologist:  Lauree Chandler, MD  Advanced Practice Provider:  No care team member to display Electrophysiologist:  None   HPI    Cassidy Larson is a 66 y.o. female with prior cocaine use, THC use, tobacco use, presents today for follow-up visit.  She was seen in the ER 03/27/2022 with crushing chest pain that started 4 hours prior to admit.  She had taken cocaine day of admit and later developed severe chest pain, left sided substernal.  She did take a nitroglycerin but it did not help.  No prior cardiac history and no medications at home.  Positive for tobacco use and cocaine use.  She stated aspirin would make her vomit but she had been able to take it in the hospital.  ER pain was 10 out of 10 chest pain with radiation to her back.  BP was stable.  She was given ASA 325 and IV heparin.  EKG with ST elevation in precordial leads V2 through V4.  Taken to Cath Lab emergently for STEMI.  In the Cath Lab, she required DES to mid LAD and ended up intubated due to increasing sedation requirements and agitation on the table.  Discharged on Brilinta and ASA 81 mg.  Today, she was a no show for her appointment.   Past Medical History    Past Medical History:  Diagnosis Date   Anxiety    CAD in native artery residual post LAD stent, 100% RCA and 80% LCx.  03/30/2022   CKD (chronic kidney disease) stage 4, GFR 15-29 ml/min (HCC)    Cocaine abuse ([place])    Essential hypertension    HLD (hyperlipidemia) 03/30/2022   Lupus ([place])    On mechanically assisted ventilation ([place]) extubated 03/28/22  03/28/2022   Polysubstance abuse ([place])    Positive ANA (antinuclear antibody)    S/P angioplasty with stent to mLAD 03/27/22 03/30/2022   Past Surgical History:  Procedure Laterality Date   CORONARY/GRAFT ACUTE MI REVASCULARIZATION N/A  03/27/2022   Procedure: Coronary/Graft Acute MI Revascularization;  Surgeon: Burnell Blanks, MD;  Location: [facility];  Service: Cardiovascular;  Laterality: N/A;   LEFT HEART CATH AND CORONARY ANGIOGRAPHY N/A 03/27/2022   Procedure: LEFT HEART CATH AND CORONARY ANGIOGRAPHY;  Surgeon: Burnell Blanks, MD;  Location: [facility];  Service: Cardiovascular;  Laterality: N/A;    Allergies  Allergies  Allergen Reactions   Aspirin Other (See Comments)    Makes sick on stomach    Latex Other (See Comments)    unspecified    EKGs/Labs/Other Studies Reviewed:   The following studies were reviewed today:  Cardiac cath 03/27/22    Prox RCA to Mid RCA lesion is 100% stenosed.   Mid Cx lesion is 80% stenosed.   Mid LAD lesion is 100% stenosed.   A drug-eluting stent was successfully placed using a SYNERGY XD 2.50X16.   Post intervention, there is a 0% residual stenosis.   Acute anterior STEMI secondary to thrombotic occlusion of the mid LAD Successful PTCA/DES x 1 mid LAD Moderately severe mid Circumflex stenosis Dominant RCA with chronic occlusion of the mid RCA. The distal RCA branches fill from left to right collaterals.  LVEDP=17 mmHg Pt intubated during case secondary to agitation.    Recommendations: Will admit to the ICU. Will ask PCCM team to  assist in ventilator management. Will continue Aggrastat infusion for 4 hours. Will give Brilinta 180 mg po x 1 once OG tube in place. Will then continue ASA/Brilinta for one year. It does not appear that she has a true ASA allergy. Will start high intensity statin. Echo in am. No beta blocker given cocaine abuse. Consider PCI of the Circumflex but this lesion is not critical and it may be best to observe her compliance with medical therapy and cocaine abstinence before placing additional coronary stents.   Diagnostic Dominance: Right  Intervention      EKG:  EKG is  ordered today.  The ekg ordered today  demonstrates   Recent Labs: 03/27/2022: ALT 16 03/31/2022: Hemoglobin 11.6; Magnesium 2.2; Platelets 232; TSH 0.864 04/02/2022: BUN 25; Creatinine, Ser 1.47; Potassium 4.3; Sodium 137  Recent Lipid Panel    Component Value Date/Time   CHOL 165 03/27/2022 2211   TRIG 33 03/27/2022 2211   HDL 47 03/27/2022 2211   CHOLHDL 3.5 03/27/2022 2211   VLDL 7 03/27/2022 2211   LDLCALC 111 (H) 03/27/2022 2211     Home Medications   No outpatient medications have been marked as taking for the 04/12/22 encounter (Appointment) with Elgie Collard, PA-C.     Review of Systems      All other systems reviewed and are otherwise negative except as noted above.  Physical Exam    VS:  There were no vitals taken for this visit. , BMI There is no height or weight on file to calculate BMI.  Wt Readings from Last 3 Encounters:  03/29/22 86 lb 3.2 oz (39.1 kg)  02/10/19 96 lb (43.5 kg)  02/09/19 96 lb 9.6 oz (43.8 kg)     GEN: Well nourished, well developed, in no acute distress. HEENT: normal. Neck: Supple, no JVD, carotid bruits, or masses. Cardiac: RRR, no murmurs, rubs, or gallops. No clubbing, cyanosis, edema.  Radials/PT 2+ and equal bilaterally.  Respiratory:  Respirations regular and unlabored, clear to auscultation bilaterally. GI: Soft, nontender, nondistended. MS: No deformity or atrophy. Skin: Warm and dry, no rash. Neuro:  Strength and sensation are intact. Psych: Normal affect.  Assessment & Plan    STEMI s/p DES to mLAD Cocaine abuse Tobacco abuse Hypertension   Disposition: Follow up  with Lauree Chandler, MD or APP.  Signed, Elgie Collard, PA-C 04/11/2022, 5:12 PM [facility]

## 2022-04-12 ENCOUNTER — Encounter: Payer: Medicaid Other | Admitting: Physician Assistant

## 2022-04-12 DIAGNOSIS — Z720 Tobacco use: Secondary | ICD-10-CM

## 2022-04-12 DIAGNOSIS — F1410 Cocaine abuse, uncomplicated: Secondary | ICD-10-CM

## 2022-04-12 DIAGNOSIS — I10 Essential (primary) hypertension: Secondary | ICD-10-CM

## 2022-04-12 DIAGNOSIS — I2102 ST elevation (STEMI) myocardial infarction involving left anterior descending coronary artery: Secondary | ICD-10-CM

## 2022-04-19 ENCOUNTER — Observation Stay (HOSPITAL_COMMUNITY)
Admission: EM | Admit: 2022-04-19 | Discharge: 2022-04-22 | Disposition: A | Discharge status: Home / Self Care | Payer: Medicaid Other | Attending: Family Medicine | Admitting: Family Medicine

## 2022-04-19 ENCOUNTER — Other Ambulatory Visit: Payer: Self-pay

## 2022-04-19 ENCOUNTER — Encounter (HOSPITAL_COMMUNITY): Payer: Self-pay | Admitting: Emergency Medicine

## 2022-04-19 ENCOUNTER — Emergency Department (HOSPITAL_COMMUNITY): Payer: Medicaid Other

## 2022-04-19 DIAGNOSIS — I252 Old myocardial infarction: Secondary | ICD-10-CM | POA: Insufficient documentation

## 2022-04-19 DIAGNOSIS — M3214 Glomerular disease in systemic lupus erythematosus: Secondary | ICD-10-CM | POA: Diagnosis not present

## 2022-04-19 DIAGNOSIS — F1910 Other psychoactive substance abuse, uncomplicated: Secondary | ICD-10-CM

## 2022-04-19 DIAGNOSIS — Z79899 Other long term (current) drug therapy: Secondary | ICD-10-CM | POA: Diagnosis not present

## 2022-04-19 DIAGNOSIS — N1832 Chronic kidney disease, stage 3b: Secondary | ICD-10-CM | POA: Diagnosis not present

## 2022-04-19 DIAGNOSIS — R519 Headache, unspecified: Secondary | ICD-10-CM | POA: Diagnosis present

## 2022-04-19 DIAGNOSIS — R079 Chest pain, unspecified: Secondary | ICD-10-CM

## 2022-04-19 DIAGNOSIS — Z9181 History of falling: Secondary | ICD-10-CM | POA: Diagnosis not present

## 2022-04-19 DIAGNOSIS — R9431 Abnormal electrocardiogram [ECG] [EKG]: Secondary | ICD-10-CM

## 2022-04-19 DIAGNOSIS — R2689 Other abnormalities of gait and mobility: Secondary | ICD-10-CM | POA: Diagnosis not present

## 2022-04-19 DIAGNOSIS — I952 Hypotension due to drugs: Secondary | ICD-10-CM

## 2022-04-19 DIAGNOSIS — Z955 Presence of coronary angioplasty implant and graft: Secondary | ICD-10-CM | POA: Insufficient documentation

## 2022-04-19 DIAGNOSIS — F32A Depression, unspecified: Secondary | ICD-10-CM | POA: Diagnosis not present

## 2022-04-19 DIAGNOSIS — I129 Hypertensive chronic kidney disease with stage 1 through stage 4 chronic kidney disease, or unspecified chronic kidney disease: Secondary | ICD-10-CM | POA: Diagnosis not present

## 2022-04-19 DIAGNOSIS — I2510 Atherosclerotic heart disease of native coronary artery without angina pectoris: Secondary | ICD-10-CM | POA: Insufficient documentation

## 2022-04-19 DIAGNOSIS — I959 Hypotension, unspecified: Secondary | ICD-10-CM | POA: Diagnosis not present

## 2022-04-19 DIAGNOSIS — F1410 Cocaine abuse, uncomplicated: Secondary | ICD-10-CM | POA: Diagnosis not present

## 2022-04-19 DIAGNOSIS — F17210 Nicotine dependence, cigarettes, uncomplicated: Secondary | ICD-10-CM | POA: Diagnosis not present

## 2022-04-19 DIAGNOSIS — E785 Hyperlipidemia, unspecified: Secondary | ICD-10-CM | POA: Diagnosis not present

## 2022-04-19 DIAGNOSIS — R197 Diarrhea, unspecified: Secondary | ICD-10-CM | POA: Diagnosis not present

## 2022-04-19 DIAGNOSIS — Z66 Do not resuscitate: Secondary | ICD-10-CM | POA: Diagnosis not present

## 2022-04-19 LAB — COMPREHENSIVE METABOLIC PANEL
ALT: 15 U/L (ref 0–44)
AST: 16 U/L (ref 15–41)
Albumin: 2.5 g/dL — ABNORMAL LOW (ref 3.5–5.0)
Alkaline Phosphatase: 48 U/L (ref 38–126)
Anion gap: 11 (ref 5–15)
BUN: 24 mg/dL — ABNORMAL HIGH (ref 8–23)
CO2: 19 mmol/L — ABNORMAL LOW (ref 22–32)
Calcium: 8 mg/dL — ABNORMAL LOW (ref 8.9–10.3)
Chloride: 109 mmol/L (ref 98–111)
Creatinine, Ser: 1.62 mg/dL — ABNORMAL HIGH (ref 0.44–1.00)
GFR, Estimated: 35 mL/min — ABNORMAL LOW (ref >60)
Glucose, Bld: 109 mg/dL — ABNORMAL HIGH (ref 70–99)
Potassium: 3.7 mmol/L (ref 3.5–5.1)
Sodium: 139 mmol/L (ref 135–145)
Total Bilirubin: 0.2 mg/dL — ABNORMAL LOW (ref 0.3–1.2)
Total Protein: 6 g/dL — ABNORMAL LOW (ref 6.5–8.1)

## 2022-04-19 LAB — RAPID URINE DRUG SCREEN, HOSP PERFORMED
Amphetamines: NOT DETECTED
Barbiturates: NOT DETECTED
Benzodiazepines: NOT DETECTED
Cocaine: POSITIVE — AB
Opiates: NOT DETECTED
Tetrahydrocannabinol: POSITIVE — AB

## 2022-04-19 LAB — CBC
HCT: 29.7 % — ABNORMAL LOW (ref 36.0–46.0)
Hemoglobin: 9.8 g/dL — ABNORMAL LOW (ref 12.0–15.0)
MCH: 31.4 pg (ref 26.0–34.0)
MCHC: 33 g/dL (ref 30.0–36.0)
MCV: 95.2 fL (ref 80.0–100.0)
Platelets: 167 10*3/uL (ref 150–400)
RBC: 3.12 MIL/uL — ABNORMAL LOW (ref 3.87–5.11)
RDW: 14.6 % (ref 11.5–15.5)
WBC: 4 10*3/uL (ref 4.0–10.5)
nRBC: 0 % (ref 0.0–0.2)

## 2022-04-19 LAB — CBC WITH DIFFERENTIAL/PLATELET
Abs Immature Granulocytes: 0.01 10*3/uL (ref 0.00–0.07)
Basophils Absolute: 0 10*3/uL (ref 0.0–0.1)
Basophils Relative: 0 %
Eosinophils Absolute: 0.1 10*3/uL (ref 0.0–0.5)
Eosinophils Relative: 2 %
HCT: 32.8 % — ABNORMAL LOW (ref 36.0–46.0)
Hemoglobin: 10.2 g/dL — ABNORMAL LOW (ref 12.0–15.0)
Immature Granulocytes: 0 %
Lymphocytes Relative: 22 %
Lymphs Abs: 1 10*3/uL (ref 0.7–4.0)
MCH: 29.9 pg (ref 26.0–34.0)
MCHC: 31.1 g/dL (ref 30.0–36.0)
MCV: 96.2 fL (ref 80.0–100.0)
Monocytes Absolute: 0.6 10*3/uL (ref 0.1–1.0)
Monocytes Relative: 13 %
Neutro Abs: 3 10*3/uL (ref 1.7–7.7)
Neutrophils Relative %: 63 %
Platelets: 182 10*3/uL (ref 150–400)
RBC: 3.41 MIL/uL — ABNORMAL LOW (ref 3.87–5.11)
RDW: 14.6 % (ref 11.5–15.5)
WBC: 4.8 10*3/uL (ref 4.0–10.5)
nRBC: 0 % (ref 0.0–0.2)

## 2022-04-19 LAB — TROPONIN I (HIGH SENSITIVITY)
Troponin I (High Sensitivity): 28 ng/L — ABNORMAL HIGH (ref <18)
Troponin I (High Sensitivity): 26 ng/L — ABNORMAL HIGH (ref <18)

## 2022-04-19 LAB — CK: Total CK: 76 U/L (ref 38–234)

## 2022-04-19 LAB — MAGNESIUM: Magnesium: 1.8 mg/dL (ref 1.7–2.4)

## 2022-04-19 MED ORDER — ATORVASTATIN CALCIUM 80 MG PO TABS
80.0000 mg | ORAL_TABLET | Freq: Every day | ORAL | Status: DC
  Administered 2022-04-19 – 2022-04-22 (×4): 80 mg via ORAL
  Filled 2022-04-19: qty 1
  Filled 2022-04-19 (×2): qty 2
  Filled 2022-04-19: qty 1

## 2022-04-19 MED ORDER — LACTATED RINGERS IV BOLUS
1000.0000 mL | Freq: Once | INTRAVENOUS | Status: AC
  Administered 2022-04-19: 1000 mL via INTRAVENOUS

## 2022-04-19 MED ORDER — TICAGRELOR 90 MG PO TABS
90.0000 mg | ORAL_TABLET | Freq: Two times a day (BID) | ORAL | Status: DC
  Administered 2022-04-19 – 2022-04-22 (×6): 90 mg via ORAL
  Filled 2022-04-19 (×6): qty 1

## 2022-04-19 MED ORDER — ENOXAPARIN SODIUM 40 MG/0.4ML IJ SOSY
40.0000 mg | PREFILLED_SYRINGE | INTRAMUSCULAR | Status: DC
  Administered 2022-04-20: 40 mg via SUBCUTANEOUS
  Filled 2022-04-19: qty 0.4

## 2022-04-19 MED ORDER — SERTRALINE HCL 50 MG PO TABS
25.0000 mg | ORAL_TABLET | Freq: Every day | ORAL | Status: DC
  Administered 2022-04-19 – 2022-04-22 (×4): 25 mg via ORAL
  Filled 2022-04-19 (×4): qty 1

## 2022-04-19 NOTE — ED Triage Notes (Signed)
Pt arrives via EMS from home with dizziness and CP. EMS reports initial BP in 60s and 400 cc NS given. Pt reports dizziness all week and chest pain all her life.

## 2022-04-19 NOTE — ED Provider Notes (Signed)
[facility] EMERGENCY DEPARTMENT Provider Note   CSN: [id number] Arrival date & time: 04/19/22  1720     History Chief Complaint  Patient presents with   Dizziness    HPI Cassidy Larson is a 66 y.o. female presenting for lightheadedness.  Patient is a 66 year old female with a extensive medical history.  Was activated as a code STEMI last month found to have an LAD occlusion.  Also there was a circumflex occlusion.  LAD was treated.  Patient was admitted to ICU.  Patient able to be weaned off ventilator off of support, discharged on Brilinta.  Patient thinks she has been taking her medicine though she might have missed it today.  Today she endorses recurrent chest pain lightheadedness dizziness, shortness of breath.  These symptoms have been intermittent.  She has been eating and drinking overall fairly well.  Denies any fevers, cough, syncope.   Patient's recorded medical, surgical, social, medication list and allergies were reviewed in the Snapshot window as part of the initial history.   Review of Systems   Review of Systems  Constitutional:  Negative for chills and fever.  HENT:  Negative for ear pain and sore throat.   Eyes:  Negative for pain and visual disturbance.  Respiratory:  Negative for cough and shortness of breath.   Cardiovascular:  Positive for chest pain. Negative for palpitations.  Gastrointestinal:  Negative for abdominal pain and vomiting.  Genitourinary:  Negative for dysuria and hematuria.  Musculoskeletal:  Negative for arthralgias and back pain.  Skin:  Negative for color change and rash.  Neurological:  Positive for dizziness and light-headedness. Negative for seizures and syncope.  All other systems reviewed and are negative.   Physical Exam Updated Vital Signs BP 119/71   Pulse 60   Temp 97.9 F (36.6 C) (Oral)   Resp 15   Ht 5\' 2"  (1.575 m)   Wt 39 kg   SpO2 100%   BMI 15.73 kg/m  Physical Exam Vitals and nursing note  reviewed.  Constitutional:      General: She is not in acute distress.    Appearance: She is well-developed.  HENT:     Head: Normocephalic and atraumatic.  Eyes:     Conjunctiva/sclera: Conjunctivae normal.  Cardiovascular:     Rate and Rhythm: Normal rate and regular rhythm.     Heart sounds: No murmur heard. Pulmonary:     Effort: Pulmonary effort is normal. No respiratory distress.     Breath sounds: Normal breath sounds.  Abdominal:     General: There is no distension.     Palpations: Abdomen is soft.     Tenderness: There is no abdominal tenderness. There is no right CVA tenderness or left CVA tenderness.  Musculoskeletal:        General: No swelling or tenderness. Normal range of motion.     Cervical back: Neck supple.  Skin:    General: Skin is warm and dry.  Neurological:     General: No focal deficit present.     Mental Status: She is alert and oriented to person, place, and time. Mental status is at baseline.     Cranial Nerves: No cranial nerve deficit.      ED Course/ Medical Decision Making/ A&P Clinical Course as of 04/19/22 2332  Fri Apr 19, 2022  1750 Discussed case with Dr. Johnsie Cancel. He reviewed EKG, feels that is likely ongoing evolution of her cardiac disease.  He recommended troponin screening, lab test, rapid  drug screen and likely admission.  He does not indicate any acute criteria to proceed to emergent catheterization.  Discussed the deeply inverted T waves.   [CC]  1854 2nd troponin, admit after [CC]  2055 Admit  [CC]    Clinical Course User Index [CC] Tretha Sciara, MD    Procedures .Critical Care  Performed by: Tretha Sciara, MD Authorized by: Tretha Sciara, MD   Critical care provider statement:    Critical care time (minutes):  30   Critical care was necessary to treat or prevent imminent or life-threatening deterioration of the following conditions:  Cardiac failure (Chest pain with troponin elevations, ischemic EKGs.)    Critical care was time spent personally by me on the following activities:  Development of treatment plan with patient or surrogate, discussions with consultants, evaluation of patient's response to treatment, examination of patient, ordering and review of laboratory studies, ordering and review of radiographic studies, ordering and performing treatments and interventions, pulse oximetry, re-evaluation of patient's condition and review of old charts   Care discussed with: admitting provider      Medications Ordered in ED Medications  atorvastatin (LIPITOR) tablet 80 mg (80 mg Oral Given 04/19/22 2227)  sertraline (ZOLOFT) tablet 25 mg (25 mg Oral Given 04/19/22 2228)  ticagrelor (BRILINTA) tablet 90 mg (90 mg Oral Given 04/19/22 2227)  enoxaparin (LOVENOX) injection 40 mg (has no administration in time range)  lactated ringers bolus 1,000 mL (0 mLs Intravenous Stopped 04/19/22 1926)    Medical Decision Making:    Cassidy Larson is a 66 y.o. female who presented to the ED today with chest pain detailed above.     Patient's presentation is complicated by their history of recent LAD occlusion.  Patient placed on continuous vitals and telemetry monitoring while in ED which was reviewed periodically.   Complete initial physical exam performed, notably the patient  was hemodynamically stable in no acute distress.  Initially hypotensive with EMS though improving and responding to IV fluids here in the emergency department.      Reviewed and confirmed nursing documentation for past medical history, family history, social history.    Initial Assessment:   With the patient's presentation of chest pain, most likely diagnosis is nonspecific etiology versus acute myocardial infarction. Other diagnoses were considered including (but not limited to) pulmonary malaise him, aortic dissection, pneumonia, pneumothorax. These are considered less likely due to history of present illness and physical exam findings.    This is most consistent with an acute life/limb threatening illness complicated by underlying chronic conditions.  Initial Plan:   Screening labs including CBC and Metabolic panel to evaluate for infectious or metabolic etiology of disease.  Urinalysis with reflex culture ordered to evaluate for UTI or relevant urologic/nephrologic pathology.  CXR to evaluate for structural/infectious intrathoracic pathology.  EKG to evaluate for cardiac pathology. Objective evaluation as below reviewed with plan for close reassessment  Initial Study Results:   Laboratory  All laboratory results reviewed without evidence of clinically relevant pathology.   Exceptions include: Elevated troponin  EKG Initial EKG immediately reviewed by ED provider.  Evidence of deep T wave inversions, patient bedded immediately.  I consulted cardiology given concern for possible STEMI equivalent given anteriorly deep T wave inversions.  On-call cardiology responded in a timely fashion and did not feel that it represents acute STEMI equivalent and recommended troponin testing and admission.  Radiology  All images reviewed independently. Agree with radiology report at this time.   DG Chest Portable 1  View  Result Date: 04/19/2022 CLINICAL DATA:  Shortness of breath. Dizziness and chest pain. EXAM: PORTABLE CHEST 1 VIEW COMPARISON:  03/28/2022 FINDINGS: The cardiomediastinal contours are stable. Atherosclerosis of the thoracic aorta. Improved bronchial thickening from prior. Pulmonary vasculature is normal. No consolidation, pleural effusion, or pneumothorax. No acute osseous abnormalities are seen. IMPRESSION: No acute chest findings. Chronic bronchial thickening that is improved from prior exam. Electronically Signed   By: Keith Rake M.D.   On: 04/19/2022 18:22     Consults:  Case discussed with cardiology/hospitalist.   Final Assessment and Plan:   Ultimately, patient's case is concerning for either cocaine induced  cardiomyopathy or recurrent ACS.  Regardless, patient is going to need ongoing close care and management and was arranged for admission after conversation with hospitalist.   Disposition:   Based on the above findings, I believe this patient is stable for admission.    Patient/family educated about specific findings on our evaluation and explained exact reasons for admission.  Patient/family educated about clinical situation and time was allowed to answer questions.   Admission team communicated with and agreed with need for admission. Patient admitted. Patient ready to move at this time.     Emergency Department Medication Summary:   Medications  atorvastatin (LIPITOR) tablet 80 mg (80 mg Oral Given 04/19/22 2227)  sertraline (ZOLOFT) tablet 25 mg (25 mg Oral Given 04/19/22 2228)  ticagrelor (BRILINTA) tablet 90 mg (90 mg Oral Given 04/19/22 2227)  enoxaparin (LOVENOX) injection 40 mg (has no administration in time range)  lactated ringers bolus 1,000 mL (0 mLs Intravenous Stopped 04/19/22 1926)         Clinical Impression:  1. Chest pain, unspecified type      Admit   Final Clinical Impression(s) / ED Diagnoses Final diagnoses:  Chest pain, unspecified type    Rx / DC Orders ED Discharge Orders     None         Tretha Sciara, MD 04/19/22 2332

## 2022-04-19 NOTE — Consult Note (Addendum)
CARDIOLOGY CONSULT NOTE       Patient ID: Cassidy Larson MRN: [id number] DOB/AGE: 1955/09/30 66 y.o.  Admit date: 04/19/2022 Referring Physician: Oswald Hillock [facility] Primary Physician: Kerin Perna, NP Primary Cardiologist: Angelena Form Reason for Consultation: Chest pain  Active Problems:   * No active hospital problems. *   HPI:  66 y.o. chronically ill black female hospitalized from 10/11-10/17 recently She was homeless now living in apartment History of drug abuse and cocaine/ETOH  Recent admission with acute anterior MI with stenting of mid LAD Residual dx includes CTO RCA with left to right collaterals and 80% mid LCX. Latter not intervened on acutely due to MI and concerns about med complains and drug use TTE showed EF 55-60% no valve dx or effusion and normal aortic root size She does not complain of chest pain to me. She does not open her eyes on interview She complains of headache/migraines She complains of all over body aches and abdominal pain and being cold. She denies drug use and indicates compliance with meds. ECG shows no acute ST elevation but biphasic / deep T waves inversions across precordium No labs back yet No CXR yet. Her peak troponin on 03/28/22 was 19,192.  In ER BP initially low but improved 110/ with LR;s. She indicates abdominal pain where they were giving her lovenox shots in hospital   ROS All other systems reviewed and negative except as noted above  Past Medical History:  Diagnosis Date   Anxiety    CAD in native artery residual post LAD stent, 100% RCA and 80% LCx.  03/30/2022   CKD (chronic kidney disease) stage 4, GFR 15-29 ml/min (HCC)    Cocaine abuse (HCC)    Essential hypertension    HLD (hyperlipidemia) 03/30/2022   Lupus ([place])    On mechanically assisted ventilation ([place]) extubated 03/28/22  03/28/2022   Polysubstance abuse ([place])    Positive ANA (antinuclear antibody)    S/P angioplasty with stent to mLAD 03/27/22 03/30/2022    Family  History  Family history unknown: Yes    Social History   Socioeconomic History   Marital status: Single    Spouse name: Not on file   Number of children: Not on file   Years of education: Not on file   Highest education level: Not on file  Occupational History   Not on file  Tobacco Use   Smoking status: Every Day    Packs/day: 0.50    Types: Cigarettes   Smokeless tobacco: Never  Substance and Sexual Activity   Alcohol use: No   Drug use: Yes    Types: Marijuana, "Crack" cocaine   Sexual activity: Yes    Birth control/protection: None  Other Topics Concern   Not on file  Social History Narrative   Not on file   Social Determinants of Health   Financial Resource Strain: Not on file  Food Insecurity: Food Insecurity Present (04/01/2022)   Hunger Vital Sign    Worried About Running Out of Food in the Last Year: Often true    Ran Out of Food in the Last Year: Often true  Transportation Needs: Unmet Transportation Needs (04/01/2022)   PRAPARE - Hydrologist (Medical): Yes    Lack of Transportation (Non-Medical): Yes  Physical Activity: Not on file  Stress: Not on file  Social Connections: Not on file  Intimate Partner Violence: Not At Risk (03/29/2022)   Humiliation, Afraid, Rape, and Kick questionnaire    Fear of  Current or Ex-Partner: No    Emotionally Abused: No    Physically Abused: No    Sexually Abused: No    Past Surgical History:  Procedure Laterality Date   CORONARY/GRAFT ACUTE MI REVASCULARIZATION N/A 03/27/2022   Procedure: Coronary/Graft Acute MI Revascularization;  Surgeon: Burnell Blanks, MD;  Location: [facility];  Service: Cardiovascular;  Laterality: N/A;   LEFT HEART CATH AND CORONARY ANGIOGRAPHY N/A 03/27/2022   Procedure: LEFT HEART CATH AND CORONARY ANGIOGRAPHY;  Surgeon: Burnell Blanks, MD;  Location: [facility];  Service: Cardiovascular;  Laterality: N/A;     No current  facility-administered medications for this encounter.  Current Outpatient Medications:    acetaminophen (TYLENOL) 500 MG tablet, Take 500 mg by mouth every 6 (six) hours as needed for mild pain or moderate pain., Disp: , Rfl:    atorvastatin (LIPITOR) 80 MG tablet, Take 1 tablet (80 mg total) by mouth daily., Disp: 90 tablet, Rfl: 3   carvedilol (COREG) 6.25 MG tablet, Take 1 tablet (6.25 mg total) by mouth 2 (two) times daily with a meal., Disp: 60 tablet, Rfl: 11   hydrALAZINE (APRESOLINE) 25 MG tablet, Take 1 tablet (25 mg total) by mouth every 8 (eight) hours., Disp: 90 tablet, Rfl: 11   isosorbide mononitrate (IMDUR) 30 MG 24 hr tablet, Take 0.5 tablets (15 mg total) by mouth daily., Disp: 15 tablet, Rfl: 11   nitroGLYCERIN (NITROSTAT) 0.4 MG SL tablet, Place 1 tablet (0.4 mg total) under the tongue every 5 (five) minutes as needed for chest pain., Disp: 25 tablet, Rfl: 3   sertraline (ZOLOFT) 25 MG tablet, Take 1 tablet (25 mg total) by mouth daily., Disp: 30 tablet, Rfl: 0   ticagrelor (BRILINTA) 90 MG TABS tablet, Take 1 tablet (90 mg total) by mouth 2 (two) times daily., Disp: 60 tablet, Rfl: 11    Physical Exam: Blood pressure (!) 96/55, pulse (!) 51, temperature (!) 96.1 F (35.6 C), temperature source Rectal, resp. rate 20, height 5\' 2"  (1.575 m), weight 39 kg, SpO2 98 %.    Chronically ill black female No focal neuro signs No murmur Lungs clear Looks dry Abdomen tender but not distended no rebound  No edema Palpable pedal pulses   Labs:   Lab Results  Component Value Date   WBC 5.4 03/31/2022   HGB 11.6 (L) 03/31/2022   HCT 35.0 (L) 03/31/2022   MCV 92.1 03/31/2022   PLT 232 03/31/2022   No results for input(s): "NA", "K", "CL", "CO2", "BUN", "CREATININE", "CALCIUM", "PROT", "BILITOT", "ALKPHOS", "ALT", "AST", "GLUCOSE" in the last 168 hours.  Invalid input(s): "LABALBU" Lab Results  Component Value Date   CKTOTAL 116 04/19/2018    Lab Results  Component Value  Date   CHOL 165 03/27/2022   Lab Results  Component Value Date   HDL 47 03/27/2022   Lab Results  Component Value Date   LDLCALC 111 (H) 03/27/2022   Lab Results  Component Value Date   TRIG 33 03/27/2022   Lab Results  Component Value Date   CHOLHDL 3.5 03/27/2022   No results found for: "LDLDIRECT"    Radiology: ECHOCARDIOGRAM COMPLETE  Result Date: 03/30/2022    ECHOCARDIOGRAM REPORT   Patient Name:   Cassidy Larson Date of Exam: 03/30/2022 Medical Rec #:  [id number]        Height:       62.0 in Accession #:    [id number]       Weight:  86.2 lb Date of Birth:  [birth year]        BSA:          1.337 m Patient Age:    68 years         BP:           117/71 mmHg Patient Gender: F                HR:           76 bpm. Exam Location:  Inpatient Procedure: 2D Echo, Cardiac Doppler, Color Doppler and Intracardiac            Opacification Agent Indications:    Acute MI  History:        Patient has no prior history of Echocardiogram examinations and                 Patient has prior history of Echocardiogram examinations, most                 recent 03/28/2022. CAD; Heart cath 03/27/22.  Sonographer:    Merrie Roof RDCS Referring Phys: [id number] [place]  1. Left ventricular ejection fraction, by estimation, is 55 to 60%. The left ventricle has normal function. The left ventricle demonstrates regional wall motion abnormalities (anteroapical hypokinesis). Left ventricular diastolic parameters were normal.  No LV thrombus  2. Right ventricular systolic function is normal. The right ventricular size is normal. Tricuspid regurgitation signal is inadequate for assessing PA pressure.  3. Left atrial size was mildly dilated.  4. The mitral valve is grossly normal. No evidence of mitral valve regurgitation.  5. The aortic valve is tricuspid. Aortic valve regurgitation is not visualized. No aortic stenosis is present. Comparison(s): Prior images reviewed side by side. This study is  interpretable. FINDINGS  Left Ventricle: Left ventricular ejection fraction, by estimation, is 55 to 60%. The left ventricle has normal function. The left ventricle demonstrates regional wall motion abnormalities. The left ventricular internal cavity size was normal in size. There is no left ventricular hypertrophy. Left ventricular diastolic parameters were normal.  LV Wall Scoring: The apical septal segment, apical anterior segment, and apex are hypokinetic. Right Ventricle: The right ventricular size is normal. Right vetricular wall thickness was not well visualized. Right ventricular systolic function is normal. Tricuspid regurgitation signal is inadequate for assessing PA pressure. Left Atrium: Left atrial size was mildly dilated. Right Atrium: Right atrial size was normal in size. Pericardium: There is no evidence of pericardial effusion. Mitral Valve: The mitral valve is grossly normal. No evidence of mitral valve regurgitation. Tricuspid Valve: The tricuspid valve is normal in structure. Tricuspid valve regurgitation is not demonstrated. No evidence of tricuspid stenosis. Aortic Valve: The aortic valve is tricuspid. Aortic valve regurgitation is not visualized. No aortic stenosis is present. Aortic valve mean gradient measures 5.0 mmHg. Aortic valve peak gradient measures 8.4 mmHg. Aortic valve area, by VTI measures 1.33 cm. Pulmonic Valve: The pulmonic valve was not well visualized. Pulmonic valve regurgitation is not visualized. Aorta: The aortic root is normal in size and structure. IAS/Shunts: No atrial level shunt detected by color flow Doppler.  LEFT VENTRICLE PLAX 2D LVIDd:         4.40 cm   Diastology LVIDs:         3.00 cm   LV e' medial:    8.05 cm/s LV PW:         0.70 cm   LV E/e' medial:  10.9 LV IVS:  0.70 cm   LV e' lateral:   11.60 cm/s LVOT diam:     1.60 cm   LV E/e' lateral: 7.5 LV SV:         37 LV SV Index:   28 LVOT Area:     2.01 cm  RIGHT VENTRICLE RV Basal diam:  3.20 cm RV S  prime:     12.50 cm/s TAPSE (M-mode): 2.1 cm LEFT ATRIUM             Index        RIGHT ATRIUM           Index LA diam:        3.70 cm 2.77 cm/m   RA Area:     11.60 cm LA Vol (A2C):   55.9 ml 41.82 ml/m  RA Volume:   27.50 ml  20.57 ml/m LA Vol (A4C):   36.5 ml 27.30 ml/m LA Biplane Vol: 46.4 ml 34.71 ml/m  AORTIC VALVE AV Area (Vmax):    1.34 cm AV Area (Vmean):   1.26 cm AV Area (VTI):     1.33 cm AV Vmax:           145.00 cm/s AV Vmean:          99.200 cm/s AV VTI:            0.280 m AV Peak Grad:      8.4 mmHg AV Mean Grad:      5.0 mmHg LVOT Vmax:         96.40 cm/s LVOT Vmean:        62.400 cm/s LVOT VTI:          0.185 m LVOT/AV VTI ratio: 0.66  AORTA Ao Root diam: 2.40 cm MITRAL VALVE MV Area (PHT): 4.49 cm    SHUNTS MV Decel Time: 169 msec    Systemic VTI:  0.18 m MV E velocity: 87.50 cm/s  Systemic Diam: 1.60 cm MV A velocity: 63.40 cm/s MV E/A ratio:  1.38 Rudean Haskell MD Electronically signed by Rudean Haskell MD Signature Date/Time: 03/30/2022/9:12:48 PM    Final    ECHOCARDIOGRAM COMPLETE  Result Date: 03/28/2022    ECHOCARDIOGRAM REPORT   Patient Name:   Cassidy Larson Date of Exam: 03/28/2022 Medical Rec #:  [id number]        Height:       62.0 in Accession #:    [id number]       Weight:       89.9 lb Date of Birth:  1956-03-08        BSA:          1.361 m Patient Age:    43 years         BP:           125/67 mmHg Patient Gender: F                HR:           59 bpm. Exam Location:  Inpatient Procedure: 2D Echo, Cardiac Doppler and Color Doppler Indications:    CAD Native Vessel I25.10  History:        Patient has no prior history of Echocardiogram examinations. CAD                 and STEMI; Risk Factors:Hypertension.  Sonographer:    Greer Pickerel Referring Phys: Burnell Blanks  Sonographer Comments: No apical window, no parasternal window, suboptimal subcostal window, Technically challenging study due  to limited acoustic windows and Technically difficult  study due to poor echo windows. Image acquisition challenging due to respiratory motion and Image acquisition challenging due to COPD. IMPRESSIONS  1. Images are not interpretable.  2. Left ventricular endocardial border not optimally defined to evaluate regional wall motion. Left ventricular diastolic function could not be evaluated.  3. Right ventricular systolic function was not well visualized. The right ventricular size is not well visualized.  4. The mitral valve was not assessed. No evidence of mitral valve regurgitation. No evidence of mitral stenosis. Severe mitral annular calcification.  5. The aortic valve was not assessed. Aortic valve regurgitation is not visualized. No aortic stenosis is present.  6. The inferior vena cava is dilated in size with <50% respiratory variability, suggesting right atrial pressure of 15 mmHg. FINDINGS  Left Ventricle: Left ventricular endocardial border not optimally defined to evaluate regional wall motion. There is no left ventricular hypertrophy. Left ventricular diastolic function could not be evaluated. Right Ventricle: The right ventricular size is not well visualized. Right vetricular wall thickness was not assessed. Right ventricular systolic function was not well visualized. Left Atrium: Left atrial size was not assessed. Right Atrium: Right atrial size was not assessed. Pericardium: The pericardium was not assessed. Mitral Valve: The mitral valve was not assessed. Severe mitral annular calcification. No evidence of mitral valve regurgitation. No evidence of mitral valve stenosis. Tricuspid Valve: The tricuspid valve is not assessed. Tricuspid valve regurgitation is not demonstrated. No evidence of tricuspid stenosis. Aortic Valve: The aortic valve was not assessed. Aortic valve regurgitation is not visualized. No aortic stenosis is present. Pulmonic Valve: The pulmonic valve was not assessed. Pulmonic valve regurgitation is not visualized. No evidence of pulmonic  stenosis. Aorta: The aortic root was not well visualized. Venous: The inferior vena cava is dilated in size with less than 50% respiratory variability, suggesting right atrial pressure of 15 mmHg. IAS/Shunts: No atrial level shunt detected by color flow Doppler. Skeet Latch MD Electronically signed by Skeet Latch MD Signature Date/Time: 03/28/2022/7:28:05 PM    Final    DG Abd 1 View  Result Date: 03/28/2022 CLINICAL DATA:  OG tube placement EXAM: ABDOMEN - 1 VIEW COMPARISON:  None Available. FINDINGS: Enteric tube terminates in the proximal gastric body. IMPRESSION: Enteric tube terminates in the proximal gastric body. Electronically Signed   By: Julian Hy M.D.   On: 03/28/2022 00:15   DG CHEST PORT 1 VIEW  Result Date: 03/28/2022 CLINICAL DATA:  ETT placement EXAM: PORTABLE CHEST 1 VIEW COMPARISON:  01/19/2022 FINDINGS: Endotracheal tube terminates 4 cm above the carina. Mild bibasilar opacities, likely atelectasis. No pleural effusion or pneumothorax. The heart normal in size.  Thoracic aortic atherosclerosis. Enteric tube courses into the proximal stomach. IMPRESSION: Endotracheal tube terminates 4 cm above the carina. Enteric tube courses into the proximal stomach. Electronically Signed   By: Julian Hy M.D.   On: 03/28/2022 00:14   CARDIAC CATHETERIZATION  Addendum Date: 03/27/2022     Prox RCA to Mid RCA lesion is 100% stenosed.   Mid Cx lesion is 80% stenosed.   Mid LAD lesion is 100% stenosed.   A drug-eluting stent was successfully placed using a SYNERGY XD 2.50X16.   Post intervention, there is a 0% residual stenosis. Acute anterior STEMI secondary to thrombotic occlusion of the mid LAD Successful PTCA/DES x 1 mid LAD Moderately severe mid Circumflex stenosis Dominant RCA with chronic occlusion of the mid RCA. The distal RCA branches fill from left to  right collaterals. LVEDP=17 mmHg Pt intubated during case secondary to agitation. Recommendations: Will admit to the  ICU. Will ask PCCM team to assist in ventilator management. Will continue Aggrastat infusion for 4 hours. Will give Brilinta 180 mg po x 1 once OG tube in place. Will then continue ASA/Brilinta for one year. It does not appear that she has a true ASA allergy. Will start high intensity statin. Echo in am. No beta blocker given cocaine abuse. Consider PCI of the Circumflex but this lesion is not critical and it may be best to observe her compliance with medical therapy and cocaine abstinence before placing additional coronary stents.   Result Date: 03/27/2022   Prox RCA to Mid RCA lesion is 100% stenosed.   Mid Cx lesion is 80% stenosed.   Mid LAD lesion is 100% stenosed.   A drug-eluting stent was successfully placed using a SYNERGY XD 2.50X16.   Post intervention, there is a 0% residual stenosis. Acute anterior STEMI secondary to thrombotic occlusion of the mid LAD Successful PTCA/DES x 1 mid LAD Moderately severe mid Circumflex stenosis Dominant RCA with chronic occlusion of the mid RCA. The distal RCA branches fill from left to right collaterals. LVEDP=17 mmHg Pt intubated during case secondary to agitation. Recommendations: Will admit to the ICU. Will ask PCCM team to assist in ventilator management. Will continue Aggrastat infusion for 4 hours. Will give Brilinta 180 mg po x 1 once OG tube in place. Will then continue ASA/Brilinta for one year. It does not appear that she has a true ASA allergy. Will start high intensity statin. Echo in am. No beta blocker given cocaine abuse. Consider PCI of the Circumflex but this lesion is not critical and it may be best to observe her compliance with medical therapy and cocaine abstinence before placing additional coronary stents.    EKG: See HPI   ASSESSMENT AND PLAN:   Abnormal ECG:  Labs pending She had a recent anterior MI Current ECG no ST elevation no indication to go to lab T wave changes may reflect evolution of MI Will see what troponins are and TTE for EF  and pericardial effusion When labs back consider CTA r/o dissection given back pain and hypotension will be able to assess for post MI pericardial effusion as well  Continue DAT Holding coreg and hydralazine for now  Hypotension: responding to fluids and she looks dry Will see what BUN/Cr WBC and Hct are like She was on coreg and bidil at home Drug Use:  pupils constricted and history of abuse urine tox screen She denies recent use  Headache:  no focal neuro signs consider head CT per admitting service  HLD:  on high dose lipitor hold until labs back to make sure no acute rhabdo from statin   Dr Rudean Haskell on call patient signed out to him Pager [phone]   Signed: Jenkins Rouge 04/19/2022, 5:58 PM  Addendum: Hsc troponin 28 reassuring given her recent disease; a repeat test is pending. CXR completed; no pleural effusion, improvement in bronchial thickening; barrel chested would be considered for COPD Creatinine has increased from prior.  Cocaine is positive: BB is being held for low blood pressure no metoprolol. THC is positive; substance misuse likely contributing to the change in mental status reported.  Overage reassuring.  No CP seen during Dr. Kyla Balzarine interview.  If chest pain overnight, this could be consistent with cocaine cardiac vasospasm.  Nitrates and benzodiazepines may be helpful.  A CPK has been added.  Rudean Haskell, MD [facility] [place], [place] [address] [phone]  7:21 PM

## 2022-04-20 DIAGNOSIS — E861 Hypovolemia: Secondary | ICD-10-CM | POA: Diagnosis not present

## 2022-04-20 DIAGNOSIS — R079 Chest pain, unspecified: Secondary | ICD-10-CM

## 2022-04-20 DIAGNOSIS — I9589 Other hypotension: Secondary | ICD-10-CM | POA: Diagnosis not present

## 2022-04-20 LAB — HIV ANTIBODY (ROUTINE TESTING W REFLEX): HIV Screen 4th Generation wRfx: NONREACTIVE

## 2022-04-20 LAB — CREATININE, SERUM
Creatinine, Ser: 1.33 mg/dL — ABNORMAL HIGH (ref 0.44–1.00)
GFR, Estimated: 45 mL/min — ABNORMAL LOW (ref >60)

## 2022-04-20 MED ORDER — MAGNESIUM SULFATE IN D5W 1-5 GM/100ML-% IV SOLN
1.0000 g | Freq: Once | INTRAVENOUS | Status: AC
  Administered 2022-04-21: 1 g via INTRAVENOUS
  Filled 2022-04-20: qty 100

## 2022-04-20 MED ORDER — ENOXAPARIN SODIUM 30 MG/0.3ML IJ SOSY
30.0000 mg | PREFILLED_SYRINGE | INTRAMUSCULAR | Status: DC
  Administered 2022-04-21 – 2022-04-22 (×2): 30 mg via SUBCUTANEOUS
  Filled 2022-04-20 (×2): qty 0.3

## 2022-04-20 MED ORDER — MAGNESIUM SULFATE 2 GM/50ML IV SOLN: 2.0000 g | Freq: Once | INTRAVENOUS | Status: DC

## 2022-04-20 MED ORDER — ASPIRIN 81 MG PO TBEC
81.0000 mg | DELAYED_RELEASE_TABLET | Freq: Every day | ORAL | Status: DC
  Administered 2022-04-20 – 2022-04-22 (×3): 81 mg via ORAL
  Filled 2022-04-20 (×4): qty 1

## 2022-04-20 NOTE — H&P (Signed)
History and Physical    Cassidy Larson GYJ:[id number] DOB: [birth year] DOA: 04/19/2022  PCP: Kerin Perna, NP   Chief Complaint: Headache  HPI: Cassidy Larson is a 66 y.o. female with medical history significant of CAD with recent MI, cocaine usage, hypertension, hyperlipidemia, lupus, polysubstance abuse who presents emergency department with headache, leg pain and back pain.  Patient was recently hospitalized on 10/11 for chest pain.  She was found to have a STEMI.  She underwent placement of a drug-eluting stent to her mid LAD.  She was discharged on aspirin and Brilinta.  Since discharge patient reports she is doing well.  She has been using cocaine daily.  She presented to the ER because she was not feeling well and she had a headache that was persistent as well as leg/back pain.  On presentation to the ER she was also endorsing some dizziness.  She had been having good p.o. intake.  EKG was obtained which showed inverted T waves in V3 and V4.  Labs were obtained which showed WBC 4.8, hemoglobin 10.2, sodium 139, bicarb 19, creatinine 1.6, troponin 28, magnesium 1.8, UDS positive for cocaine and marijuana.  Cardiology was consulted due to concern for ACS.  And thought EKG changes were unlikely to be due to acute event recommended volume resuscitation and holding her antihypertensives.  She was admitted for further management.  On admission she denied any chest pain.  She endorsed a headache as well as back pain from laying in bed.  No fevers or chills to suggest that she has an infection.   Review of Systems: Review of Systems  All other systems reviewed and are negative.    As per HPI otherwise 10 point review of systems negative.   Allergies  Allergen Reactions   Aspirin Other (See Comments)    Makes sick on stomach    Latex Other (See Comments)    unspecified    Past Medical History:  Diagnosis Date   Anxiety    CAD in native artery residual post LAD stent, 100% RCA  and 80% LCx.  03/30/2022   CKD (chronic kidney disease) stage 4, GFR 15-29 ml/min (HCC)    Cocaine abuse ([place])    Essential hypertension    HLD (hyperlipidemia) 03/30/2022   Lupus ([place])    On mechanically assisted ventilation ([place]) extubated 03/28/22  03/28/2022   Polysubstance abuse ([place])    Positive ANA (antinuclear antibody)    S/P angioplasty with stent to mLAD 03/27/22 03/30/2022    Past Surgical History:  Procedure Laterality Date   CORONARY/GRAFT ACUTE MI REVASCULARIZATION N/A 03/27/2022   Procedure: Coronary/Graft Acute MI Revascularization;  Surgeon: Burnell Blanks, MD;  Location: [facility];  Service: Cardiovascular;  Laterality: N/A;   LEFT HEART CATH AND CORONARY ANGIOGRAPHY N/A 03/27/2022   Procedure: LEFT HEART CATH AND CORONARY ANGIOGRAPHY;  Surgeon: Burnell Blanks, MD;  Location: [facility];  Service: Cardiovascular;  Laterality: N/A;     reports that she has been smoking cigarettes. She has been smoking an average of .5 packs per day. She has never used smokeless tobacco. She reports current drug use. Drugs: Marijuana and "Crack" cocaine. She reports that she does not drink alcohol.  Family History  Family history unknown: Yes    Prior to Admission medications   Medication Sig Start Date End Date Taking? Authorizing Provider  acetaminophen (TYLENOL) 500 MG tablet Take 500 mg by mouth every 6 (six) hours as needed for mild pain or moderate  pain.    [provider]  atorvastatin (LIPITOR) 80 MG tablet Take 1 tablet (80 mg total) by mouth daily. 04/01/22   Burnell Blanks, MD  carvedilol (COREG) 6.25 MG tablet Take 1 tablet (6.25 mg total) by mouth 2 (two) times daily with a meal. 04/01/22   Burnell Blanks, MD  hydrALAZINE (APRESOLINE) 25 MG tablet Take 1 tablet (25 mg total) by mouth every 8 (eight) hours. 04/01/22   Burnell Blanks, MD  isosorbide mononitrate (IMDUR) 30 MG 24 hr tablet Take 0.5 tablets  (15 mg total) by mouth daily. 04/01/22   Burnell Blanks, MD  nitroGLYCERIN (NITROSTAT) 0.4 MG SL tablet Place 1 tablet (0.4 mg total) under the tongue every 5 (five) minutes as needed for chest pain. 04/01/22 04/01/23  Burnell Blanks, MD  sertraline (ZOLOFT) 25 MG tablet Take 1 tablet (25 mg total) by mouth daily. 04/02/22   Cheryln Manly, NP  ticagrelor (BRILINTA) 90 MG TABS tablet Take 1 tablet (90 mg total) by mouth 2 (two) times daily. 04/01/22   Burnell Blanks, MD    Physical Exam: Vitals:   04/19/22 2230 04/19/22 2236 04/19/22 2300 04/19/22 2330  BP: 119/71  119/72 122/75  Pulse: 60  (!) 52 (!) 56  Resp: 15  13 13   Temp:  97.9 F (36.6 C)    TempSrc:  Oral    SpO2: 100%  99% 99%  Weight:      Height:       Physical Exam Constitutional:      Appearance: She is normal weight.  HENT:     Head: Normocephalic.     Mouth/Throat:     Mouth: Mucous membranes are moist.     Pharynx: Oropharynx is clear.  Cardiovascular:     Rate and Rhythm: Normal rate and regular rhythm.  Pulmonary:     Effort: Pulmonary effort is normal.     Breath sounds: Normal breath sounds.  Abdominal:     General: Abdomen is flat. Bowel sounds are normal.  Skin:    General: Skin is warm and dry.     Capillary Refill: Capillary refill takes less than 2 seconds.  Neurological:     General: No focal deficit present.     Mental Status: She is alert and oriented to person, place, and time.  Psychiatric:        Mood and Affect: Mood normal.        Labs on Admission: I have personally reviewed the patients's labs and imaging studies.  Assessment/Plan Principal Problem:   Hypotension Patient found to have systolics in the 25K.  Was given fluid resuscitation with improvement to systolics in the 270W. - EKG showed concern for possible ischemic event however cardiology was consulted with low concern - Patient has UDS positive for cocaine likely source of elevated troponin  and EKG changes  Plan: Suspicion for ACS is extremely low We will hold goal-directed therapy due to hypotension Continue dual antiplatelet therapy with aspirin and Brilinta  Hyperlipidemia-continue Lipitor  Depression-continue Zoloft  Polysubstance abuse-encourage cocaine cessation in setting of ischemic heart disease   Admission status: Observation Telemetry Medical  Certification: The appropriate patient status for this patient is OBSERVATION. Observation status is judged to be reasonable and necessary in order to provide the required intensity of service to ensure the patient's safety. The patient's presenting symptoms, physical exam findings, and initial radiographic and laboratory data in the context of their medical condition is felt to place them  at decreased risk for further clinical deterioration. Furthermore, it is anticipated that the patient will be medically stable for discharge from the hospital within 2 midnights of admission.     Emilee Hero MD [facility] If 7PM-7AM, please contact night-coverage [email or web address]  04/20/2022, Due to hypotensionGoal-directed medical therapy12:26 AM

## 2022-04-20 NOTE — ED Notes (Signed)
Patient is incontinent of stool, defecated in bed.

## 2022-04-20 NOTE — ED Notes (Signed)
Patient does not want breakfast tray

## 2022-04-20 NOTE — Progress Notes (Signed)
Rounding Note    Patient Name: Cassidy Larson Date of Encounter: 04/20/2022  [place] Cardiologist: Lauree Chandler, MD   Subjective   Slight chest pain this am.  Inpatient Medications    Scheduled Meds:  aspirin EC  81 mg Oral Daily   atorvastatin  80 mg Oral Daily   enoxaparin (LOVENOX) injection  40 mg Subcutaneous Q24H   sertraline  25 mg Oral Daily   ticagrelor  90 mg Oral BID   Continuous Infusions:  PRN Meds:    Vital Signs    Vitals:   04/20/22 0700 04/20/22 0726 04/20/22 0730 04/20/22 0800  BP: 124/69  (!) 136/93 133/74  Pulse: (!) 56  (!) 53 (!) 51  Resp:   (!) 24 13  Temp:  97.7 F (36.5 C)    TempSrc:  Oral    SpO2: 99%  99% 97%  Weight:      Height:        Intake/Output Summary (Last 24 hours) at 04/20/2022 0832 Last data filed at 04/19/2022 2050 Gross per 24 hour  Intake 999 ml  Output 400 ml  Net 599 ml      04/19/2022    5:29 PM 03/29/2022    6:19 AM 03/28/2022   12:00 AM  Last 3 Weights  Weight (lbs) 85 lb 15.7 oz 86 lb 3.2 oz 89 lb 15.2 oz  Weight (kg) 39 kg 39.1 kg 40.8 kg      Telemetry    SR   Personally Reviewed  ECG    SR, abnormal ST T waves anterolateral  - Personally Reviewed  Physical Exam   GEN: No acute distress.   Neck: No JVD Cardiac: RRR, no murmurs, rubs, or gallops.  Respiratory: Clear to auscultation bilaterally. GI: Soft, nontender, non-distended  MS: No edema; No deformity. Neuro:  Nonfocal  Psych: Normal affect   Labs    High Sensitivity Troponin:   Recent Labs  Lab 03/27/22 2211 03/28/22 0050 04/19/22 1734 04/19/22 1933  TROPONINIHS 11 19,192* 28* 26*     Chemistry Recent Labs  Lab 04/19/22 1734 04/19/22 2143  NA 139  --   K 3.7  --   CL 109  --   CO2 19*  --   GLUCOSE 109*  --   BUN 24*  --   CREATININE 1.62* 1.33*  CALCIUM 8.0*  --   MG 1.8  --   PROT 6.0*  --   ALBUMIN 2.5*  --   AST 16  --   ALT 15  --   ALKPHOS 48  --   BILITOT 0.2*  --    GFRNONAA 35* 45*  ANIONGAP 11  --     Lipids No results for input(s): "CHOL", "TRIG", "HDL", "LABVLDL", "LDLCALC", "CHOLHDL" in the last 168 hours.  Hematology Recent Labs  Lab 04/19/22 1734 04/19/22 2143  WBC 4.8 4.0  RBC 3.41* 3.12*  HGB 10.2* 9.8*  HCT 32.8* 29.7*  MCV 96.2 95.2  MCH 29.9 31.4  MCHC 31.1 33.0  RDW 14.6 14.6  PLT 182 167   Thyroid No results for input(s): "TSH", "FREET4" in the last 168 hours.  BNPNo results for input(s): "BNP", "PROBNP" in the last 168 hours.  DDimer No results for input(s): "DDIMER" in the last 168 hours.   Radiology    DG Chest Portable 1 View  Result Date: 04/19/2022 CLINICAL DATA:  Shortness of breath. Dizziness and chest pain. EXAM: PORTABLE CHEST 1 VIEW COMPARISON:  03/28/2022 FINDINGS: The cardiomediastinal  contours are stable. Atherosclerosis of the thoracic aorta. Improved bronchial thickening from prior. Pulmonary vasculature is normal. No consolidation, pleural effusion, or pneumothorax. No acute osseous abnormalities are seen. IMPRESSION: No acute chest findings. Chronic bronchial thickening that is improved from prior exam. Electronically Signed   By: Keith Rake M.D.   On: 04/19/2022 18:22   ECHOCARDIOGRAM COMPLETE  Result Date: 03/30/2022    ECHOCARDIOGRAM REPORT   Patient Name:   Cassidy Larson Date of Exam: 03/30/2022 Medical Rec #:  [id number]        Height:       62.0 in Accession #:    [id number]       Weight:       86.2 lb Date of Birth:  10/11/1955        BSA:          1.337 m Patient Age:    25 years         BP:           117/71 mmHg Patient Gender: F                HR:           76 bpm. Exam Location:  Inpatient Procedure: 2D Echo, Cardiac Doppler, Color Doppler and Intracardiac            Opacification Agent Indications:    Acute MI  History:        Patient has no prior history of Echocardiogram examinations and                 Patient has prior history of Echocardiogram examinations, most                 recent  03/28/2022. CAD; Heart cath 03/27/22.  Sonographer:    Merrie Roof RDCS Referring Phys: [id number] [place]  1. Left ventricular ejection fraction, by estimation, is 55 to 60%. The left ventricle has normal function. The left ventricle demonstrates regional wall motion abnormalities (anteroapical hypokinesis). Left ventricular diastolic parameters were normal.  No LV thrombus  2. Right ventricular systolic function is normal. The right ventricular size is normal. Tricuspid regurgitation signal is inadequate for assessing PA pressure.  3. Left atrial size was mildly dilated.  4. The mitral valve is grossly normal. No evidence of mitral valve regurgitation.  5. The aortic valve is tricuspid. Aortic valve regurgitation is not visualized. No aortic stenosis is present. Comparison(s): Prior images reviewed side by side. This study is interpretable. FINDINGS  Left Ventricle: Left ventricular ejection fraction, by estimation, is 55 to 60%. The left ventricle has normal function. The left ventricle demonstrates regional wall motion abnormalities. The left ventricular internal cavity size was normal in size. There is no left ventricular hypertrophy. Left ventricular diastolic parameters were normal.  LV Wall Scoring: The apical septal segment, apical anterior segment, and apex are hypokinetic. Right Ventricle: The right ventricular size is normal. Right vetricular wall thickness was not well visualized. Right ventricular systolic function is normal. Tricuspid regurgitation signal is inadequate for assessing PA pressure. Left Atrium: Left atrial size was mildly dilated. Right Atrium: Right atrial size was normal in size. Pericardium: There is no evidence of pericardial effusion. Mitral Valve: The mitral valve is grossly normal. No evidence of mitral valve regurgitation. Tricuspid Valve: The tricuspid valve is normal in structure. Tricuspid valve regurgitation is not demonstrated. No evidence of tricuspid  stenosis. Aortic Valve: The aortic valve is tricuspid. Aortic valve regurgitation is not visualized.  No aortic stenosis is present. Aortic valve mean gradient measures 5.0 mmHg. Aortic valve peak gradient measures 8.4 mmHg. Aortic valve area, by VTI measures 1.33 cm. Pulmonic Valve: The pulmonic valve was not well visualized. Pulmonic valve regurgitation is not visualized. Aorta: The aortic root is normal in size and structure. IAS/Shunts: No atrial level shunt detected by color flow Doppler.  LEFT VENTRICLE PLAX 2D LVIDd:         4.40 cm   Diastology LVIDs:         3.00 cm   LV e' medial:    8.05 cm/s LV PW:         0.70 cm   LV E/e' medial:  10.9 LV IVS:        0.70 cm   LV e' lateral:   11.60 cm/s LVOT diam:     1.60 cm   LV E/e' lateral: 7.5 LV SV:         37 LV SV Index:   28 LVOT Area:     2.01 cm  RIGHT VENTRICLE RV Basal diam:  3.20 cm RV S prime:     12.50 cm/s TAPSE (M-mode): 2.1 cm LEFT ATRIUM             Index        RIGHT ATRIUM           Index LA diam:        3.70 cm 2.77 cm/m   RA Area:     11.60 cm LA Vol (A2C):   55.9 ml 41.82 ml/m  RA Volume:   27.50 ml  20.57 ml/m LA Vol (A4C):   36.5 ml 27.30 ml/m LA Biplane Vol: 46.4 ml 34.71 ml/m  AORTIC VALVE AV Area (Vmax):    1.34 cm AV Area (Vmean):   1.26 cm AV Area (VTI):     1.33 cm AV Vmax:           145.00 cm/s AV Vmean:          99.200 cm/s AV VTI:            0.280 m AV Peak Grad:      8.4 mmHg AV Mean Grad:      5.0 mmHg LVOT Vmax:         96.40 cm/s LVOT Vmean:        62.400 cm/s LVOT VTI:          0.185 m LVOT/AV VTI ratio: 0.66  AORTA Ao Root diam: 2.40 cm MITRAL VALVE MV Area (PHT): 4.49 cm    SHUNTS MV Decel Time: 169 msec    Systemic VTI:  0.18 m MV E velocity: 87.50 cm/s  Systemic Diam: 1.60 cm MV A velocity: 63.40 cm/s MV E/A ratio:  1.38 Rudean Haskell MD Electronically signed by Rudean Haskell MD Signature Date/Time: 03/30/2022/9:12:48 PM    Final    ECHOCARDIOGRAM COMPLETE  Result Date: 03/28/2022     ECHOCARDIOGRAM REPORT   Patient Name:   Keisa Blow Date of Exam: 03/28/2022 Medical Rec #:  [id number]        Height:       62.0 in Accession #:    [id number]       Weight:       89.9 lb Date of Birth:  Feb 11, 1956        BSA:          1.361 m Patient Age:    80 years  BP:           125/67 mmHg Patient Gender: F                HR:           59 bpm. Exam Location:  Inpatient Procedure: 2D Echo, Cardiac Doppler and Color Doppler Indications:    CAD Native Vessel I25.10  History:        Patient has no prior history of Echocardiogram examinations. CAD                 and STEMI; Risk Factors:Hypertension.  Sonographer:    Greer Pickerel Referring Phys: Burnell Blanks  Sonographer Comments: No apical window, no parasternal window, suboptimal subcostal window, Technically challenging study due to limited acoustic windows and Technically difficult study due to poor echo windows. Image acquisition challenging due to respiratory motion and Image acquisition challenging due to COPD. IMPRESSIONS  1. Images are not interpretable.  2. Left ventricular endocardial border not optimally defined to evaluate regional wall motion. Left ventricular diastolic function could not be evaluated.  3. Right ventricular systolic function was not well visualized. The right ventricular size is not well visualized.  4. The mitral valve was not assessed. No evidence of mitral valve regurgitation. No evidence of mitral stenosis. Severe mitral annular calcification.  5. The aortic valve was not assessed. Aortic valve regurgitation is not visualized. No aortic stenosis is present.  6. The inferior vena cava is dilated in size with <50% respiratory variability, suggesting right atrial pressure of 15 mmHg. FINDINGS  Left Ventricle: Left ventricular endocardial border not optimally defined to evaluate regional wall motion. There is no left ventricular hypertrophy. Left ventricular diastolic function could not be evaluated. Right  Ventricle: The right ventricular size is not well visualized. Right vetricular wall thickness was not assessed. Right ventricular systolic function was not well visualized. Left Atrium: Left atrial size was not assessed. Right Atrium: Right atrial size was not assessed. Pericardium: The pericardium was not assessed. Mitral Valve: The mitral valve was not assessed. Severe mitral annular calcification. No evidence of mitral valve regurgitation. No evidence of mitral valve stenosis. Tricuspid Valve: The tricuspid valve is not assessed. Tricuspid valve regurgitation is not demonstrated. No evidence of tricuspid stenosis. Aortic Valve: The aortic valve was not assessed. Aortic valve regurgitation is not visualized. No aortic stenosis is present. Pulmonic Valve: The pulmonic valve was not assessed. Pulmonic valve regurgitation is not visualized. No evidence of pulmonic stenosis. Aorta: The aortic root was not well visualized. Venous: The inferior vena cava is dilated in size with less than 50% respiratory variability, suggesting right atrial pressure of 15 mmHg. IAS/Shunts: No atrial level shunt detected by color flow Doppler. Skeet Latch MD Electronically signed by Skeet Latch MD Signature Date/Time: 03/28/2022/7:28:05 PM    Final    DG Abd 1 View  Result Date: 03/28/2022 CLINICAL DATA:  OG tube placement EXAM: ABDOMEN - 1 VIEW COMPARISON:  None Available. FINDINGS: Enteric tube terminates in the proximal gastric body. IMPRESSION: Enteric tube terminates in the proximal gastric body. Electronically Signed   By: Julian Hy M.D.   On: 03/28/2022 00:15   DG CHEST PORT 1 VIEW  Result Date: 03/28/2022 CLINICAL DATA:  ETT placement EXAM: PORTABLE CHEST 1 VIEW COMPARISON:  01/19/2022 FINDINGS: Endotracheal tube terminates 4 cm above the carina. Mild bibasilar opacities, likely atelectasis. No pleural effusion or pneumothorax. The heart normal in size.  Thoracic aortic atherosclerosis. Enteric tube  courses into the proximal  stomach. IMPRESSION: Endotracheal tube terminates 4 cm above the carina. Enteric tube courses into the proximal stomach. Electronically Signed   By: Julian Hy M.D.   On: 03/28/2022 00:14   CARDIAC CATHETERIZATION  Addendum Date: 03/27/2022     Prox RCA to Mid RCA lesion is 100% stenosed.   Mid Cx lesion is 80% stenosed.   Mid LAD lesion is 100% stenosed.   A drug-eluting stent was successfully placed using a SYNERGY XD 2.50X16.   Post intervention, there is a 0% residual stenosis. Acute anterior STEMI secondary to thrombotic occlusion of the mid LAD Successful PTCA/DES x 1 mid LAD Moderately severe mid Circumflex stenosis Dominant RCA with chronic occlusion of the mid RCA. The distal RCA branches fill from left to right collaterals. LVEDP=17 mmHg Pt intubated during case secondary to agitation. Recommendations: Will admit to the ICU. Will ask PCCM team to assist in ventilator management. Will continue Aggrastat infusion for 4 hours. Will give Brilinta 180 mg po x 1 once OG tube in place. Will then continue ASA/Brilinta for one year. It does not appear that she has a true ASA allergy. Will start high intensity statin. Echo in am. No beta blocker given cocaine abuse. Consider PCI of the Circumflex but this lesion is not critical and it may be best to observe her compliance with medical therapy and cocaine abstinence before placing additional coronary stents.   Result Date: 03/27/2022   Prox RCA to Mid RCA lesion is 100% stenosed.   Mid Cx lesion is 80% stenosed.   Mid LAD lesion is 100% stenosed.   A drug-eluting stent was successfully placed using a SYNERGY XD 2.50X16.   Post intervention, there is a 0% residual stenosis. Acute anterior STEMI secondary to thrombotic occlusion of the mid LAD Successful PTCA/DES x 1 mid LAD Moderately severe mid Circumflex stenosis Dominant RCA with chronic occlusion of the mid RCA. The distal RCA branches fill from left to right collaterals.  LVEDP=17 mmHg Pt intubated during case secondary to agitation. Recommendations: Will admit to the ICU. Will ask PCCM team to assist in ventilator management. Will continue Aggrastat infusion for 4 hours. Will give Brilinta 180 mg po x 1 once OG tube in place. Will then continue ASA/Brilinta for one year. It does not appear that she has a true ASA allergy. Will start high intensity statin. Echo in am. No beta blocker given cocaine abuse. Consider PCI of the Circumflex but this lesion is not critical and it may be best to observe her compliance with medical therapy and cocaine abstinence before placing additional coronary stents.     Cardiac Studies     Patient Profile     66 y.o. female with substance use disorder including cocaine, presented with cocaine use and anterior STEMI 03/27/22 with DES to mid LAD for 100% occlusion. Known residual disease 80% circ, CTO RCA. Staged PCI to Lcx considered if patient compliant with DAPT. Presents to ER with dizziness and chest pain, cocaine positive UDS, acute EKG changes but reassuring troponins. Felt to be cocaine related chest pain.   Assessment & Plan   Chest pain Recent anterior STEMI Ongoing cocaine abuse HLD - T waves abnormal on EKG but troponins minimal elevated and flat. Suspect cocaine vasospasm and demand ischemia.  - if chest pain recurs, can repeat a limited echo at that time. -I have instructed the patient that dual antiplatelet therapy should be taken for 1 year without interruption.  We have discussed the consequences of interrupted dual antiplatelet  therapy and the risk for in-stent thrombosis.  -elevated Lpa and LDL above goal. Continue statin, LFTs permissible.   5. Hypotension -fluid responsive. Resume GDMT as tolerated.   6. Drug use - involve case manager for community resources for quitting.    [place] will sign off.   Medication Recommendations:  continue current meds as tolerated Other recommendations (labs,  testing, etc):  n/a Follow up as an outpatient:  patient no showed 10/27. Will arrange again post hospital. Recommend PCP follow up for continuity of care.  For questions or updates, please contact [facility] Please consult [email or web address] for contact info under        Signed, Elouise Munroe, MD  04/20/2022, 8:32 AM

## 2022-04-20 NOTE — ED Notes (Signed)
Patient complaining of loose stools.

## 2022-04-20 NOTE — Progress Notes (Addendum)
Consultation Progress Note   Patient: Cassidy Larson HDQ:[id number] DOB: September 15, 1955 DOA: 04/19/2022 DOS: the patient was seen and examined on 04/20/2022 Primary service: Samarion Ehle, Manfred Shirts, MD  Brief hospital course: 66 y.o. female with medical history significant of CAD with recent MI, cocaine usage, hypertension, hyperlipidemia, lupus, polysubstance abuse who presents emergency department with headache, leg pain and back pain.  Patient was recently hospitalized on 10/11 for chest pain.  She was found to have a STEMI.  She underwent placement of a drug-eluting stent to her mid LAD.  She was discharged on aspirin and Brilinta.  Since discharge patient reports she is doing well.  She has been using cocaine daily.  She presented to the ER because she was not feeling well and she had a headache that was persistent as well as leg/back pain   Assessment and Plan: Principal Problem:   Hypotension-resolved Patient found to have systolics in the 11H.  Was given fluid resuscitation with improvement to systolics in the 417E. - EKG showed concern for possible ischemic event however cardiology was consulted with low concern - Patient has UDS positive for cocaine likely source of elevated troponin and EKG changes   Plan: Suspicion for ACS is extremely low We will hold goal-directed therapy due to hypotension Continue dual antiplatelet therapy with aspirin and [facility] cleared patient.  11/4-Diarrhea- Incontinent of stools, send stool samples for evaluation. Start anti diarrhea if negative for C.diff   Hyperlipidemia-continue Lipitor   Depression-continue Zoloft   Polysubstance abuse-encourage cocaine cessation in setting of ischemic heart disease       [facility] will continue to follow the patient.  Subjective: Reports diarrhea, poor appetite, anxiety.  Physical Exam:  Constitutional:      Appearance: She is normal weight.  HENT:     Head: Normocephalic.     Mouth/Throat:      Mouth: Mucous membranes are moist.     Pharynx: Oropharynx is clear.  Cardiovascular:     Rate and Rhythm: Normal rate and regular rhythm.  Pulmonary:     Effort: Pulmonary effort is normal.     Breath sounds: Normal breath sounds.  Abdominal:     General: Abdomen is flat. Bowel sounds are normal.  Skin:    General: Skin is warm and dry.     Capillary Refill: Capillary refill takes less than 2 seconds.  Neurological:     General: No focal deficit present.     Mental Status: She is alert and oriented to person, place, and time.  Psychiatric:        Mood and Affect: Mood normal. Vitals:   04/20/22 0730 04/20/22 0800 04/20/22 1000 04/20/22 1115  BP: (!) 136/93 133/74 (!) 152/75   Pulse: (!) 53 (!) 51 (!) 54   Resp: (!) 24 13 17    Temp:    98.4 F (36.9 C)  TempSrc:    Oral  SpO2: 99% 97% 97%   Weight:      Height:        Data Reviewed:  There are no new results to review at this time.  Family Communication:   Time spent: 15 minutes.  Author: Cristela Felt, MD 04/20/2022 12:33 PM  For on call review [email or web address].

## 2022-04-21 ENCOUNTER — Observation Stay (HOSPITAL_COMMUNITY): Payer: Medicaid Other

## 2022-04-21 DIAGNOSIS — E861 Hypovolemia: Secondary | ICD-10-CM

## 2022-04-21 DIAGNOSIS — I9589 Other hypotension: Secondary | ICD-10-CM | POA: Diagnosis not present

## 2022-04-21 DIAGNOSIS — R079 Chest pain, unspecified: Secondary | ICD-10-CM | POA: Diagnosis not present

## 2022-04-21 DIAGNOSIS — I959 Hypotension, unspecified: Secondary | ICD-10-CM | POA: Diagnosis not present

## 2022-04-21 DIAGNOSIS — I129 Hypertensive chronic kidney disease with stage 1 through stage 4 chronic kidney disease, or unspecified chronic kidney disease: Secondary | ICD-10-CM | POA: Diagnosis not present

## 2022-04-21 DIAGNOSIS — I2510 Atherosclerotic heart disease of native coronary artery without angina pectoris: Secondary | ICD-10-CM | POA: Diagnosis not present

## 2022-04-21 LAB — C DIFFICILE QUICK SCREEN W PCR REFLEX
C Diff antigen: NEGATIVE
C Diff interpretation: NOT DETECTED
C Diff toxin: NEGATIVE

## 2022-04-21 LAB — TROPONIN I (HIGH SENSITIVITY)
Troponin I (High Sensitivity): 23 ng/L — ABNORMAL HIGH (ref <18)
Troponin I (High Sensitivity): 23 ng/L — ABNORMAL HIGH (ref <18)

## 2022-04-21 MED ORDER — LOPERAMIDE HCL 2 MG PO CAPS
2.0000 mg | ORAL_CAPSULE | ORAL | Status: DC | PRN
  Administered 2022-04-21: 2 mg via ORAL
  Filled 2022-04-21: qty 1

## 2022-04-21 MED ORDER — ACETAMINOPHEN 325 MG PO TABS
650.0000 mg | ORAL_TABLET | Freq: Four times a day (QID) | ORAL | Status: DC | PRN
  Administered 2022-04-21: 650 mg via ORAL
  Filled 2022-04-21: qty 2

## 2022-04-21 MED ORDER — NITROGLYCERIN 0.4 MG SL SUBL
0.4000 mg | SUBLINGUAL_TABLET | SUBLINGUAL | Status: DC | PRN
  Administered 2022-04-21 (×3): 0.4 mg via SUBLINGUAL
  Filled 2022-04-21 (×2): qty 1

## 2022-04-21 MED ORDER — ALUM & MAG HYDROXIDE-SIMETH 200-200-20 MG/5ML PO SUSP
30.0000 mL | Freq: Once | ORAL | Status: AC
  Administered 2022-04-21: 30 mL via ORAL
  Filled 2022-04-21: qty 30

## 2022-04-21 MED ORDER — HYDRALAZINE HCL 25 MG PO TABS: 25.0000 mg | ORAL_TABLET | Freq: Four times a day (QID) | ORAL | Status: DC | PRN

## 2022-04-21 MED ORDER — NITROGLYCERIN 0.4 MG SL SUBL
SUBLINGUAL_TABLET | SUBLINGUAL | Status: AC
  Filled 2022-04-21: qty 1

## 2022-04-21 MED ORDER — ISOSORBIDE MONONITRATE ER 30 MG PO TB24
15.0000 mg | ORAL_TABLET | Freq: Every day | ORAL | Status: DC
  Administered 2022-04-21 – 2022-04-22 (×2): 15 mg via ORAL
  Filled 2022-04-21 (×2): qty 1

## 2022-04-21 NOTE — Progress Notes (Addendum)
PROGRESS NOTE    Cassidy Larson  JSE:[id number] DOB: Feb 06, 1956 DOA: 04/19/2022 PCP: Kerin Perna, NP  Chief Complaint  Patient presents with   Dizziness    Brief Narrative:  Cassidy Larson is Cassidy Larson 66 y.o. female with medical history significant of CAD with recent MI, cocaine usage, hypertension, hyperlipidemia, lupus, polysubstance abuse who presents emergency department with headache, leg pain and back pain.  Patient was recently hospitalized on 10/11 for chest pain.  She was found to have Cigi Bega STEMI.  She underwent placement of Opie Fanton drug-eluting stent to her mid LAD.  She was discharged on aspirin and Brilinta.  Since discharge patient reports she is doing well.  She has been using cocaine daily.  She presented to the ER because she was not feeling well and she had Georgene Kopper headache that was persistent as well as leg/back pain.  On presentation to the ER she was also endorsing some dizziness.  She had been having good p.o. intake.  EKG was obtained which showed inverted T waves in V3 and V4.  Labs were obtained which showed WBC 4.8, hemoglobin 10.2, sodium 139, bicarb 19, creatinine 1.6, troponin 28, magnesium 1.8, UDS positive for cocaine and marijuana.  Cardiology was consulted due to concern for ACS.  And thought EKG changes were unlikely to be due to acute event recommended volume resuscitation and holding her antihypertensives.  She was admitted for further management.   On admission she denied any chest pain.  She endorsed Zahava Quant headache as well as back pain from laying in bed.  No fevers or chills to suggest that she has an infection.  Assessment & Plan:   Principal Problem:   Hypotension Active Problems:   Chest pain   Assessment and Plan: Chest Pain Recurrent chest pain today in the setting of getting up to bathroom.  Improved with nitro.   CXR pending Follow EKG's -> T wave inversions Repeat troponin stable  Limited echo pending  + cocaine use Appreciate cardiology assistance -  recommended limited echo if CP recurs, DAPT x1 year, statin  Hypotension  Hypertension Initially hypotensive to the 80's S/p IVF Now hypertensive, resume imdur, add prn hydral    AKI Renal function improving Follow   Diarrhea Follow c diff Additional w/u as needed  Hyperlipidemia-continue Lipitor   Depression-continue Zoloft   Polysubstance abuse-encourage cocaine cessation in setting of ischemic heart disease    DVT prophylaxis: lovenox Code Status: DNR Family Communication: none Disposition:   Status is: Observation The patient remains OBS appropriate and will d/c before 2 midnights.   Consultants:  cardiology  Procedures:  none  Antimicrobials:  Anti-infectives (From admission, onward)    None       Subjective: No complaints, anxious about going home Later, after using bathroom, c/o nausea/CP after getting up to bathroom   Objective: Vitals:   04/21/22 1302 04/21/22 1325 04/21/22 1327 04/21/22 1332  BP: (!) 193/94 (!) 157/75 (!) 157/75 (!) 144/90  Pulse:      Resp:      Temp:      TempSrc:      SpO2:      Weight:      Height:       No intake or output data in the 24 hours ending 04/21/22 1401 Filed Weights   04/19/22 1729  Weight: 39 kg    Examination:  General exam: Appears calm and comfortable  Respiratory system: Clear to auscultation. Respiratory effort normal. Cardiovascular system: S1 & S2 heard, RRR.  Not reproducible with palpation. Gastrointestinal system: Abdomen is nondistended, soft and nontender.  Central nervous system: Alert and oriented. No focal neurological deficits. Extremities: no LEE   Data Reviewed: I have personally reviewed following labs and imaging studies  CBC: Recent Labs  Lab 04/19/22 1734 04/19/22 2143  WBC 4.8 4.0  NEUTROABS 3.0  --   HGB 10.2* 9.8*  HCT 32.8* 29.7*  MCV 96.2 95.2  PLT 182 665    Basic Metabolic Panel: Recent Labs  Lab 04/19/22 1734 04/19/22 2143  NA 139  --   K 3.7   --   CL 109  --   CO2 19*  --   GLUCOSE 109*  --   BUN 24*  --   CREATININE 1.62* 1.33*  CALCIUM 8.0*  --   MG 1.8  --     GFR: Estimated Creatinine Clearance: 26.3 mL/min (Shenay Torti) (by C-G formula based on SCr of 1.33 mg/dL (H)).  Liver Function Tests: Recent Labs  Lab 04/19/22 1734  AST 16  ALT 15  ALKPHOS 48  BILITOT 0.2*  PROT 6.0*  ALBUMIN 2.5*    CBG: No results for input(s): "GLUCAP" in the last 168 hours.   No results found for this or any previous visit (from the past 240 hour(s)).       Radiology Studies: DG Chest Portable 1 View  Result Date: 04/19/2022 CLINICAL DATA:  Shortness of breath. Dizziness and chest pain. EXAM: PORTABLE CHEST 1 VIEW COMPARISON:  03/28/2022 FINDINGS: The cardiomediastinal contours are stable. Atherosclerosis of the thoracic aorta. Improved bronchial thickening from prior. Pulmonary vasculature is normal. No consolidation, pleural effusion, or pneumothorax. No acute osseous abnormalities are seen. IMPRESSION: No acute chest findings. Chronic bronchial thickening that is improved from prior exam. Electronically Signed   By: Keith Rake M.D.   On: 04/19/2022 18:22        Scheduled Meds:  aspirin EC  81 mg Oral Daily   atorvastatin  80 mg Oral Daily   enoxaparin (LOVENOX) injection  30 mg Subcutaneous Q24H   isosorbide mononitrate  15 mg Oral Daily   sertraline  25 mg Oral Daily   ticagrelor  90 mg Oral BID   Continuous Infusions:   LOS: 0 days    Time spent: over 30 min    Fayrene Helper, MD [facility]   To contact the attending provider between 7A-7P or the covering provider during after hours 7P-7A, please log into the web site [email or web address] and access using universal [place] password for that web site. If you do not have the password, please call the hospital operator.  04/21/2022, 2:01 PM

## 2022-04-21 NOTE — Care Management Obs Status (Deleted)
[place] NOTIFICATION   Patient Details  Name: Cassidy Larson MRN: [id number] Date of Birth: 09-25-1955   Medicare Observation Status Notification Given:  Yes    Tom-Johnson, Renea Ee, RN 04/21/2022, 11:18 AM

## 2022-04-22 ENCOUNTER — Other Ambulatory Visit (HOSPITAL_COMMUNITY): Payer: Self-pay

## 2022-04-22 ENCOUNTER — Observation Stay (HOSPITAL_BASED_OUTPATIENT_CLINIC_OR_DEPARTMENT_OTHER): Payer: Medicaid Other

## 2022-04-22 DIAGNOSIS — R079 Chest pain, unspecified: Secondary | ICD-10-CM

## 2022-04-22 DIAGNOSIS — I9589 Other hypotension: Secondary | ICD-10-CM | POA: Diagnosis not present

## 2022-04-22 DIAGNOSIS — E861 Hypovolemia: Secondary | ICD-10-CM | POA: Diagnosis not present

## 2022-04-22 LAB — CBC WITH DIFFERENTIAL/PLATELET
Abs Immature Granulocytes: 0.01 10*3/uL (ref 0.00–0.07)
Basophils Absolute: 0 10*3/uL (ref 0.0–0.1)
Basophils Relative: 0 %
Eosinophils Absolute: 0.1 10*3/uL (ref 0.0–0.5)
Eosinophils Relative: 3 %
HCT: 33.2 % — ABNORMAL LOW (ref 36.0–46.0)
Hemoglobin: 11 g/dL — ABNORMAL LOW (ref 12.0–15.0)
Immature Granulocytes: 0 %
Lymphocytes Relative: 28 %
Lymphs Abs: 1.1 10*3/uL (ref 0.7–4.0)
MCH: 30.7 pg (ref 26.0–34.0)
MCHC: 33.1 g/dL (ref 30.0–36.0)
MCV: 92.7 fL (ref 80.0–100.0)
Monocytes Absolute: 0.5 10*3/uL (ref 0.1–1.0)
Monocytes Relative: 13 %
Neutro Abs: 2.1 10*3/uL (ref 1.7–7.7)
Neutrophils Relative %: 56 %
Platelets: 179 10*3/uL (ref 150–400)
RBC: 3.58 MIL/uL — ABNORMAL LOW (ref 3.87–5.11)
RDW: 14.3 % (ref 11.5–15.5)
WBC: 3.7 10*3/uL — ABNORMAL LOW (ref 4.0–10.5)
nRBC: 0 % (ref 0.0–0.2)

## 2022-04-22 LAB — COMPREHENSIVE METABOLIC PANEL
ALT: 16 U/L (ref 0–44)
AST: 14 U/L — ABNORMAL LOW (ref 15–41)
Albumin: 2.6 g/dL — ABNORMAL LOW (ref 3.5–5.0)
Alkaline Phosphatase: 51 U/L (ref 38–126)
Anion gap: 8 (ref 5–15)
BUN: 17 mg/dL (ref 8–23)
CO2: 20 mmol/L — ABNORMAL LOW (ref 22–32)
Calcium: 8.5 mg/dL — ABNORMAL LOW (ref 8.9–10.3)
Chloride: 111 mmol/L (ref 98–111)
Creatinine, Ser: 1.44 mg/dL — ABNORMAL HIGH (ref 0.44–1.00)
GFR, Estimated: 41 mL/min — ABNORMAL LOW (ref >60)
Glucose, Bld: 93 mg/dL (ref 70–99)
Potassium: 4 mmol/L (ref 3.5–5.1)
Sodium: 139 mmol/L (ref 135–145)
Total Bilirubin: 0.5 mg/dL (ref 0.3–1.2)
Total Protein: 6.2 g/dL — ABNORMAL LOW (ref 6.5–8.1)

## 2022-04-22 LAB — PHOSPHORUS: Phosphorus: 4 mg/dL (ref 2.5–4.6)

## 2022-04-22 LAB — ECHOCARDIOGRAM LIMITED
Height: 62 in
S' Lateral: 3.1 cm
Weight: 1375.67 oz

## 2022-04-22 LAB — MAGNESIUM: Magnesium: 2 mg/dL (ref 1.7–2.4)

## 2022-04-22 MED ORDER — SERTRALINE HCL 25 MG PO TABS: 25.0000 mg | ORAL_TABLET | Freq: Every day | ORAL | 1 refills | Status: DC

## 2022-04-22 MED ORDER — SERTRALINE HCL 25 MG PO TABS
25.0000 mg | ORAL_TABLET | Freq: Every day | ORAL | 1 refills | Status: DC
  Filled 2022-04-22: qty 30, 30d supply, fill #0

## 2022-04-22 NOTE — Discharge Summary (Signed)
Physician Discharge Summary  Cassidy Larson IHK:[id number] DOB: 1956/05/30 DOA: 04/19/2022  PCP: Kerin Perna, NP  Admit date: 04/19/2022 Discharge date: 04/22/2022  Time spent: 40 minutes  Recommendations for Outpatient Follow-up:  Follow outpatient CBC/CMP  Follow with cardiology outpatient Follow blood pressure outpatient, adjust regimen as needed - holding beta blocker at discharge due to bradycardia, consider outpatient based on trends Encourage cessation of drug use   Discharge Diagnoses:  Principal Problem:   Hypotension Active Problems:   Chest pain   Discharge Condition: stable  Diet recommendation: heart healthy  Filed Weights   04/19/22 1729  Weight: 39 kg    History of present illness:  Cassidy Larson is Cassidy Larson 66 y.o. female with medical history significant of CAD with recent MI, cocaine usage, hypertension, hyperlipidemia, lupus, polysubstance abuse who presents emergency department with headache, leg pain and back pain.  Patient was recently hospitalized on 10/11 for chest pain.  She was found to have Cassidy Larson STEMI.  She underwent placement of Cassidy Larson drug-eluting stent to her mid LAD.  She was discharged on aspirin and Brilinta.  Since discharge patient reports she is doing well.  She has been using cocaine daily.  She presented to the ER because she was not feeling well and she had Cassidy Larson headache that was persistent as well as leg/back pain.  On presentation to the ER she was also endorsing some dizziness.  She had been having good p.o. intake.  EKG was obtained which showed inverted T waves in V3 and V4.  Labs were obtained which showed WBC 4.8, hemoglobin 10.2, sodium 139, bicarb 19, creatinine 1.6, troponin 28, magnesium 1.8, UDS positive for cocaine and marijuana.  Cardiology was consulted due to concern for ACS.  And thought EKG changes were unlikely to be due to acute event recommended volume resuscitation and holding her antihypertensives.  She was admitted for further  management.   On admission she denied any chest pain.  She endorsed Cassidy Larson headache as well as back pain from laying in bed.  No fevers or chills to suggest that she has an infection.  She was admitted for chest pain rule out.  Symptoms recurred on 11/5 after she was up and about, but now improved on day of discharge.  Limited echo 11/6 without any new significant findings.  Planning for outpatient cardiology follow up.  See below for additional details  Hospital Course:  Assessment and Plan: Chest Pain resolved CXR without active disease Follow EKG's -> T wave inversions Repeat troponin stable  Limited echo with EF 50-55%, regional wall motion abnormalities, apical hypokinesis (10/14 echo showed EF 55-60%, RWMA, anteroapical hypokinesis) + cocaine use Appreciate cardiology assistance - recommended limited echo if CP recurs (as above, discussed with cards, ok with discharge and outpatient follow up), DAPT x1 year, statin   Hypotension  Hypertension Initially hypotensive to the 80's Now hypertensive, resume imdur and hydral at discharge.  Will continue to hold coreg with bradycardia.   CKD IIIb Renal function stable at discharge, doubt AKI, seems within baseline range Appears close to recent baseline 1.2-1.5   Diarrhea Follow c diff - negative Additional w/u as needed - seems improved   Hyperlipidemia-continue Lipitor   Depression-continue Zoloft   Polysubstance abuse-encourage cocaine cessation in setting of ischemic heart disease      Procedures: Echo IMPRESSIONS     1. Left ventricular ejection fraction, by estimation, is 50 to 55%. The  left ventricle has low normal function. The left ventricle demonstrates  regional wall  motion abnormalities. Apical hypokinesis.   2. Right ventricular systolic function is normal. The right ventricular  size is normal.   3. The mitral valve is normal in structure. Trivial mitral valve  regurgitation.   4. The aortic valve was not  well visualized. Aortic valve regurgitation  is not visualized.    Consultations: cardiology  Discharge Exam: Vitals:   04/22/22 0753 04/22/22 1343  BP: 132/65 137/67  Pulse: (!) 57 65  Resp: 18 16  Temp:  98.4 F (36.9 C)  SpO2: 98% 90%   Says why so soon when I ask her about her readiness for discharge Notes chest soreness, not pain (reproduced with palpation) No other complaints  General: No acute distress. Cardiovascular: RRR Lungs: unlabroed Abdomen: Soft, nontender, nondistended  Neurological: Alert and oriented 3. Moves all extremities 4 with equal strength. Cranial nerves II through XII grossly intact. Extremities: No clubbing or cyanosis. No edema.   Discharge Instructions   Discharge Instructions     Call MD for:  difficulty breathing, headache or visual disturbances   Complete by: As directed    Call MD for:  extreme fatigue   Complete by: As directed    Call MD for:  hives   Complete by: As directed    Call MD for:  persistant dizziness or light-headedness   Complete by: As directed    Call MD for:  persistant nausea and vomiting   Complete by: As directed    Call MD for:  redness, tenderness, or signs of infection (pain, swelling, redness, odor or green/yellow discharge around incision site)   Complete by: As directed    Call MD for:  severe uncontrolled pain   Complete by: As directed    Call MD for:  temperature >100.4   Complete by: As directed    Diet - low sodium heart healthy   Complete by: As directed    Discharge instructions   Complete by: As directed    You were seen for chest pain.  Your symptoms have improved.  Your workup was reassuring.  Your ultrasound of your heart appeared stable.  You were seen by cardiology who recommended you continue the aspirin and brilinta.  These meds should be continued without interruption for 1 year.  Continue your atorvastatin as well.  Stop your coreg, your heart rate is on the slower side.   Continue your imdur and hydralazine.    Stopping drug use is extremely important for your health.  Return for new, recurrent, or worsening symptoms.  Please ask your PCP to request records from this hospitalization so they know what was done and what the next steps will be.   Increase activity slowly   Complete by: As directed       Allergies as of 04/22/2022       Reactions   Asa [aspirin] Diarrhea, Nausea And Vomiting, Other (See Comments)   Severe stomach cramps Diarrhea (sometimes with blood)   Latex Itching        Medication List     STOP taking these medications    carvedilol 6.25 MG tablet Commonly known as: COREG       TAKE these medications    aspirin 81 MG chewable tablet Chew 81 mg by mouth daily.   atorvastatin 80 MG tablet Commonly known as: LIPITOR Take 1 tablet (80 mg total) by mouth daily.   Brilinta 90 MG Tabs tablet Generic drug: ticagrelor Take 1 tablet (90 mg total) by mouth 2 (two) times daily.  hydrALAZINE 25 MG tablet Commonly known as: APRESOLINE Take 1 tablet (25 mg total) by mouth every 8 (eight) hours.   isosorbide mononitrate 30 MG 24 hr tablet Commonly known as: IMDUR Take 0.5 tablets (15 mg total) by mouth daily.   nitroGLYCERIN 0.4 MG SL tablet Commonly known as: Nitrostat Place 1 tablet (0.4 mg total) under the tongue every 5 (five) minutes as needed for chest pain.   sertraline 25 MG tablet Commonly known as: ZOLOFT Take 1 tablet (25 mg total) by mouth daily.               Durable Medical Equipment  (From admission, onward)           Start     Ordered   04/22/22 1313  For home use only DME Walker rolling  Once       Question Answer Comment  Walker: Other   Comments rollator   Patient needs Dory Demont walker to treat with the following condition Gait abnormality      04/22/22 1313           Allergies  Allergen Reactions   Asa [Aspirin] Diarrhea, Nausea And Vomiting and Other (See Comments)    Severe  stomach cramps Diarrhea (sometimes with blood)   Latex Itching      The results of significant diagnostics from this hospitalization (including imaging, microbiology, ancillary and laboratory) are listed below for reference.    Significant Diagnostic Studies: ECHOCARDIOGRAM LIMITED  Result Date: 04/22/2022    ECHOCARDIOGRAM LIMITED REPORT   Patient Name:   Cassidy Larson Date of Exam: 04/22/2022 Medical Rec #:  [id number]        Height:       62.0 in Accession #:    [id number]       Weight:       86.0 lb Date of Birth:  1956-04-15        BSA:          1.335 m Patient Age:    9 years         BP:           132/65 mmHg Patient Gender: F                HR:           54 bpm. Exam Location:  Inpatient Procedure: Limited Echo and Color Doppler Indications:    Chest pain  History:        Patient has prior history of Echocardiogram examinations. CAD                 and Previous Myocardial Infarction; Risk Factors:Hypertension                 and Dyslipidemia. Polysubstance use, cocaine use, Lupus.  Sonographer:    Eartha Inch Referring Phys: Cephus Slater, Reann Dobias, CALDWELL  Sonographer Comments: Image acquisition challenging due to respiratory motion and Image acquisition challenging due to patient body habitus. IMPRESSIONS  1. Left ventricular ejection fraction, by estimation, is 50 to 55%. The left ventricle has low normal function. The left ventricle demonstrates regional wall motion abnormalities. Apical hypokinesis.  2. Right ventricular systolic function is normal. The right ventricular size is normal.  3. The mitral valve is normal in structure. Trivial mitral valve regurgitation.  4. The aortic valve was not well visualized. Aortic valve regurgitation is not visualized. FINDINGS  Left Ventricle: Left ventricular ejection fraction, by estimation, is 50 to 55%. The left ventricle has low normal  function. The left ventricle demonstrates regional wall motion abnormalities. The left ventricular internal cavity  size was normal in size. There is no left ventricular hypertrophy. Right Ventricle: The right ventricular size is normal. No increase in right ventricular wall thickness. Right ventricular systolic function is normal. Pericardium: There is no evidence of pericardial effusion. Mitral Valve: The mitral valve is normal in structure. Trivial mitral valve regurgitation. Tricuspid Valve: The tricuspid valve is normal in structure. Tricuspid valve regurgitation is trivial. Aortic Valve: The aortic valve was not well visualized. Aortic valve regurgitation is not visualized. Additional Comments: Color Doppler performed.  LEFT VENTRICLE PLAX 2D LVIDd:         4.80 cm LVIDs:         3.10 cm LV PW:         0.70 cm LV IVS:        0.60 cm  LEFT ATRIUM         Index LA diam:    3.80 cm 2.85 cm/m Oswaldo Milian MD Electronically signed by Oswaldo Milian MD Signature Date/Time: 04/22/2022/11:20:10 AM    Final    DG CHEST PORT 1 VIEW  Result Date: 04/21/2022 CLINICAL DATA:  Chest pain EXAM: PORTABLE CHEST 1 VIEW COMPARISON:  04/19/2022 FINDINGS: The heart size and mediastinal contours are within normal limits. Chronically hyperinflated lungs. No focal airspace consolidation, pleural effusion, or pneumothorax. The visualized skeletal structures are unremarkable. IMPRESSION: No active disease. Electronically Signed   By: Davina Poke D.O.   On: 04/21/2022 16:06   DG Chest Portable 1 View  Result Date: 04/19/2022 CLINICAL DATA:  Shortness of breath. Dizziness and chest pain. EXAM: PORTABLE CHEST 1 VIEW COMPARISON:  03/28/2022 FINDINGS: The cardiomediastinal contours are stable. Atherosclerosis of the thoracic aorta. Improved bronchial thickening from prior. Pulmonary vasculature is normal. No consolidation, pleural effusion, or pneumothorax. No acute osseous abnormalities are seen. IMPRESSION: No acute chest findings. Chronic bronchial thickening that is improved from prior exam. Electronically Signed   By:  Keith Rake M.D.   On: 04/19/2022 18:22   ECHOCARDIOGRAM COMPLETE  Result Date: 03/30/2022    ECHOCARDIOGRAM REPORT   Patient Name:   Cassidy Larson Date of Exam: 03/30/2022 Medical Rec #:  [id number]        Height:       62.0 in Accession #:    [id number]       Weight:       86.2 lb Date of Birth:  26-Jan-1956        BSA:          1.337 m Patient Age:    29 years         BP:           117/71 mmHg Patient Gender: F                HR:           76 bpm. Exam Location:  Inpatient Procedure: 2D Echo, Cardiac Doppler, Color Doppler and Intracardiac            Opacification Agent Indications:    Acute MI  History:        Patient has no prior history of Echocardiogram examinations and                 Patient has prior history of Echocardiogram examinations, most                 recent 03/28/2022. CAD; Heart cath 03/27/22.  Sonographer:  Merrie Roof RDCS Referring Phys: [id number] [place]  1. Left ventricular ejection fraction, by estimation, is 55 to 60%. The left ventricle has normal function. The left ventricle demonstrates regional wall motion abnormalities (anteroapical hypokinesis). Left ventricular diastolic parameters were normal.  No LV thrombus  2. Right ventricular systolic function is normal. The right ventricular size is normal. Tricuspid regurgitation signal is inadequate for assessing PA pressure.  3. Left atrial size was mildly dilated.  4. The mitral valve is grossly normal. No evidence of mitral valve regurgitation.  5. The aortic valve is tricuspid. Aortic valve regurgitation is not visualized. No aortic stenosis is present. Comparison(s): Prior images reviewed side by side. This study is interpretable. FINDINGS  Left Ventricle: Left ventricular ejection fraction, by estimation, is 55 to 60%. The left ventricle has normal function. The left ventricle demonstrates regional wall motion abnormalities. The left ventricular internal cavity size was normal in size. There is no  left ventricular hypertrophy. Left ventricular diastolic parameters were normal.  LV Wall Scoring: The apical septal segment, apical anterior segment, and apex are hypokinetic. Right Ventricle: The right ventricular size is normal. Right vetricular wall thickness was not well visualized. Right ventricular systolic function is normal. Tricuspid regurgitation signal is inadequate for assessing PA pressure. Left Atrium: Left atrial size was mildly dilated. Right Atrium: Right atrial size was normal in size. Pericardium: There is no evidence of pericardial effusion. Mitral Valve: The mitral valve is grossly normal. No evidence of mitral valve regurgitation. Tricuspid Valve: The tricuspid valve is normal in structure. Tricuspid valve regurgitation is not demonstrated. No evidence of tricuspid stenosis. Aortic Valve: The aortic valve is tricuspid. Aortic valve regurgitation is not visualized. No aortic stenosis is present. Aortic valve mean gradient measures 5.0 mmHg. Aortic valve peak gradient measures 8.4 mmHg. Aortic valve area, by VTI measures 1.33 cm. Pulmonic Valve: The pulmonic valve was not well visualized. Pulmonic valve regurgitation is not visualized. Aorta: The aortic root is normal in size and structure. IAS/Shunts: No atrial level shunt detected by color flow Doppler.  LEFT VENTRICLE PLAX 2D LVIDd:         4.40 cm   Diastology LVIDs:         3.00 cm   LV e' medial:    8.05 cm/s LV PW:         0.70 cm   LV E/e' medial:  10.9 LV IVS:        0.70 cm   LV e' lateral:   11.60 cm/s LVOT diam:     1.60 cm   LV E/e' lateral: 7.5 LV SV:         37 LV SV Index:   28 LVOT Area:     2.01 cm  RIGHT VENTRICLE RV Basal diam:  3.20 cm RV S prime:     12.50 cm/s TAPSE (M-mode): 2.1 cm LEFT ATRIUM             Index        RIGHT ATRIUM           Index LA diam:        3.70 cm 2.77 cm/m   RA Area:     11.60 cm LA Vol (A2C):   55.9 ml 41.82 ml/m  RA Volume:   27.50 ml  20.57 ml/m LA Vol (A4C):   36.5 ml 27.30 ml/m LA  Biplane Vol: 46.4 ml 34.71 ml/m  AORTIC VALVE AV Area (Vmax):    1.34 cm AV Area (  Vmean):   1.26 cm AV Area (VTI):     1.33 cm AV Vmax:           145.00 cm/s AV Vmean:          99.200 cm/s AV VTI:            0.280 m AV Peak Grad:      8.4 mmHg AV Mean Grad:      5.0 mmHg LVOT Vmax:         96.40 cm/s LVOT Vmean:        62.400 cm/s LVOT VTI:          0.185 m LVOT/AV VTI ratio: 0.66  AORTA Ao Root diam: 2.40 cm MITRAL VALVE MV Area (PHT): 4.49 cm    SHUNTS MV Decel Time: 169 msec    Systemic VTI:  0.18 m MV E velocity: 87.50 cm/s  Systemic Diam: 1.60 cm MV Jamorian Dimaria velocity: 63.40 cm/s MV E/Nyela Cortinas ratio:  1.38 Rudean Haskell MD Electronically signed by Rudean Haskell MD Signature Date/Time: 03/30/2022/9:12:48 PM    Final    ECHOCARDIOGRAM COMPLETE  Result Date: 03/28/2022    ECHOCARDIOGRAM REPORT   Patient Name:   Cassidy Larson Date of Exam: 03/28/2022 Medical Rec #:  [id number]        Height:       62.0 in Accession #:    [id number]       Weight:       89.9 lb Date of Birth:  04/24/1956        BSA:          1.361 m Patient Age:    45 years         BP:           125/67 mmHg Patient Gender: F                HR:           59 bpm. Exam Location:  Inpatient Procedure: 2D Echo, Cardiac Doppler and Color Doppler Indications:    CAD Native Vessel I25.10  History:        Patient has no prior history of Echocardiogram examinations. CAD                 and STEMI; Risk Factors:Hypertension.  Sonographer:    Greer Pickerel Referring Phys: Burnell Blanks  Sonographer Comments: No apical window, no parasternal window, suboptimal subcostal window, Technically challenging study due to limited acoustic windows and Technically difficult study due to poor echo windows. Image acquisition challenging due to respiratory motion and Image acquisition challenging due to COPD. IMPRESSIONS  1. Images are not interpretable.  2. Left ventricular endocardial border not optimally defined to evaluate regional wall motion. Left  ventricular diastolic function could not be evaluated.  3. Right ventricular systolic function was not well visualized. The right ventricular size is not well visualized.  4. The mitral valve was not assessed. No evidence of mitral valve regurgitation. No evidence of mitral stenosis. Severe mitral annular calcification.  5. The aortic valve was not assessed. Aortic valve regurgitation is not visualized. No aortic stenosis is present.  6. The inferior vena cava is dilated in size with <50% respiratory variability, suggesting right atrial pressure of 15 mmHg. FINDINGS  Left Ventricle: Left ventricular endocardial border not optimally defined to evaluate regional wall motion. There is no left ventricular hypertrophy. Left ventricular diastolic function could not be evaluated. Right Ventricle: The right ventricular size is not well visualized. Right  vetricular wall thickness was not assessed. Right ventricular systolic function was not well visualized. Left Atrium: Left atrial size was not assessed. Right Atrium: Right atrial size was not assessed. Pericardium: The pericardium was not assessed. Mitral Valve: The mitral valve was not assessed. Severe mitral annular calcification. No evidence of mitral valve regurgitation. No evidence of mitral valve stenosis. Tricuspid Valve: The tricuspid valve is not assessed. Tricuspid valve regurgitation is not demonstrated. No evidence of tricuspid stenosis. Aortic Valve: The aortic valve was not assessed. Aortic valve regurgitation is not visualized. No aortic stenosis is present. Pulmonic Valve: The pulmonic valve was not assessed. Pulmonic valve regurgitation is not visualized. No evidence of pulmonic stenosis. Aorta: The aortic root was not well visualized. Venous: The inferior vena cava is dilated in size with less than 50% respiratory variability, suggesting right atrial pressure of 15 mmHg. IAS/Shunts: No atrial level shunt detected by color flow Doppler. Skeet Latch MD  Electronically signed by Skeet Latch MD Signature Date/Time: 03/28/2022/7:28:05 PM    Final    DG Abd 1 View  Result Date: 03/28/2022 CLINICAL DATA:  OG tube placement EXAM: ABDOMEN - 1 VIEW COMPARISON:  None Available. FINDINGS: Enteric tube terminates in the proximal gastric body. IMPRESSION: Enteric tube terminates in the proximal gastric body. Electronically Signed   By: Julian Hy M.D.   On: 03/28/2022 00:15   DG CHEST PORT 1 VIEW  Result Date: 03/28/2022 CLINICAL DATA:  ETT placement EXAM: PORTABLE CHEST 1 VIEW COMPARISON:  01/19/2022 FINDINGS: Endotracheal tube terminates 4 cm above the carina. Mild bibasilar opacities, likely atelectasis. No pleural effusion or pneumothorax. The heart normal in size.  Thoracic aortic atherosclerosis. Enteric tube courses into the proximal stomach. IMPRESSION: Endotracheal tube terminates 4 cm above the carina. Enteric tube courses into the proximal stomach. Electronically Signed   By: Julian Hy M.D.   On: 03/28/2022 00:14   CARDIAC CATHETERIZATION  Addendum Date: 03/27/2022     Prox RCA to Mid RCA lesion is 100% stenosed.   Mid Cx lesion is 80% stenosed.   Mid LAD lesion is 100% stenosed.   Sherma Vanmetre drug-eluting stent was successfully placed using Shelisa Fern SYNERGY XD 2.50X16.   Post intervention, there is Kaladin Noseworthy 0% residual stenosis. Acute anterior STEMI secondary to thrombotic occlusion of the mid LAD Successful PTCA/DES x 1 mid LAD Moderately severe mid Circumflex stenosis Dominant RCA with chronic occlusion of the mid RCA. The distal RCA branches fill from left to right collaterals. LVEDP=17 mmHg Pt intubated during case secondary to agitation. Recommendations: Will admit to the ICU. Will ask PCCM team to assist in ventilator management. Will continue Aggrastat infusion for 4 hours. Will give Brilinta 180 mg po x 1 once OG tube in place. Will then continue ASA/Brilinta for one year. It does not appear that she has Shyler Holzman true ASA allergy. Will start high  intensity statin. Echo in am. No beta blocker given cocaine abuse. Consider PCI of the Circumflex but this lesion is not critical and it may be best to observe her compliance with medical therapy and cocaine abstinence before placing additional coronary stents.   Result Date: 03/27/2022   Prox RCA to Mid RCA lesion is 100% stenosed.   Mid Cx lesion is 80% stenosed.   Mid LAD lesion is 100% stenosed.   Romelia Bromell drug-eluting stent was successfully placed using Lui Bellis SYNERGY XD 2.50X16.   Post intervention, there is Valerya Maxton 0% residual stenosis. Acute anterior STEMI secondary to thrombotic occlusion of the mid LAD Successful PTCA/DES x  1 mid LAD Moderately severe mid Circumflex stenosis Dominant RCA with chronic occlusion of the mid RCA. The distal RCA branches fill from left to right collaterals. LVEDP=17 mmHg Pt intubated during case secondary to agitation. Recommendations: Will admit to the ICU. Will ask PCCM team to assist in ventilator management. Will continue Aggrastat infusion for 4 hours. Will give Brilinta 180 mg po x 1 once OG tube in place. Will then continue ASA/Brilinta for one year. It does not appear that she has Pieter Fooks true ASA allergy. Will start high intensity statin. Echo in am. No beta blocker given cocaine abuse. Consider PCI of the Circumflex but this lesion is not critical and it may be best to observe her compliance with medical therapy and cocaine abstinence before placing additional coronary stents.    Microbiology: Recent Results (from the past 240 hour(s))  C Difficile Quick Screen w PCR reflex     Status: None   Collection Time: 04/21/22  1:10 PM   Specimen: STOOL  Result Value Ref Range Status   C Diff antigen NEGATIVE NEGATIVE Final   C Diff toxin NEGATIVE NEGATIVE Final   C Diff interpretation No C. difficile detected.  Final    Comment: Performed at [facility], [place] [address], [place], [place] [address]     Labs: Basic Metabolic Panel: Recent Labs  Lab 04/19/22 1734  04/19/22 2143 04/22/22 0207  NA 139  --  139  K 3.7  --  4.0  CL 109  --  111  CO2 19*  --  20*  GLUCOSE 109*  --  93  BUN 24*  --  17  CREATININE 1.62* 1.33* 1.44*  CALCIUM 8.0*  --  8.5*  MG 1.8  --  2.0  PHOS  --   --  4.0   Liver Function Tests: Recent Labs  Lab 04/19/22 1734 04/22/22 0207  AST 16 14*  ALT 15 16  ALKPHOS 48 51  BILITOT 0.2* 0.5  PROT 6.0* 6.2*  ALBUMIN 2.5* 2.6*   No results for input(s): "LIPASE", "AMYLASE" in the last 168 hours. No results for input(s): "AMMONIA" in the last 168 hours. CBC: Recent Labs  Lab 04/19/22 1734 04/19/22 2143 04/22/22 0207  WBC 4.8 4.0 3.7*  NEUTROABS 3.0  --  2.1  HGB 10.2* 9.8* 11.0*  HCT 32.8* 29.7* 33.2*  MCV 96.2 95.2 92.7  PLT 182 167 179   Cardiac Enzymes: Recent Labs  Lab 04/19/22 1933  CKTOTAL 76   BNP: BNP (last 3 results) No results for input(s): "BNP" in the last 8760 hours.  ProBNP (last 3 results) No results for input(s): "PROBNP" in the last 8760 hours.  CBG: No results for input(s): "GLUCAP" in the last 168 hours.     Signed:  Fayrene Helper MD.  [facility] 04/22/2022, 2:06 PM

## 2022-04-22 NOTE — Progress Notes (Addendum)
CSW received consult for patient, food and utility resources. CSW spoke with patient at bedside. CSW offered patient food resources, patient accepted. CSW offered patient [employer] guide to assist with utility assistance. Patient politely declined. Patient informed CSW she has resources for utility assistance. Patient reports will need some clothes when medically ready for discharge. CSW will look in social work closet for some clothes for patient. Patient thanked CSW. All questions answered. No further questions reported at this time.  Update 1:59pm-CSW provided patient with  clothes and shoes from social work Risk analyst. Patient accepted.All questions answered. No further questions reported at this time. Rn informed CSW patient needs cab/transportation home. CSW provided patients RN with cab voucher for patient.

## 2022-04-22 NOTE — Care Management (Signed)
  Transition of Care [facility]) Screening Note   Patient Details  Name: Cassidy Larson Date of Birth: Oct 28, 1955   Transition of Care [facility]) CM/SW Contact:    Bethena Roys, RN Phone Number: 04/22/2022, 2:35 PM    Transition of Care Department [facility]) has reviewed the patient. Patient is from home alone and wants the rollator- Agency discussed and patient is agreeable to [employer]. Order placed via [employer] and the DME will be delivered to the room prior to transition home. No recommendations for home health at this time. No further home needs identified-patient to transition home today.

## 2022-04-22 NOTE — Progress Notes (Signed)
Patient was given discharge instructions and stated understanding.  Helped patient get dressed and she is waiting on her TOC meds and Walker.

## 2022-04-22 NOTE — Progress Notes (Signed)
Mobility Specialist - Progress Note   04/22/22 1145  Mobility  Activity Ambulated with assistance in hallway  Level of Assistance Contact guard assist, steadying assist  Assistive Device Front wheel walker  Distance Ambulated (ft) 240 ft  Activity Response Tolerated fair  Mobility Referral Yes  $Mobility charge 1 Mobility   Pt received in chair and agreeable to mobility. Pt required x2 standing rest break d/t LE pain. Pt was returned to chair with all needs met.   Larey Seat

## 2022-04-22 NOTE — Evaluation (Signed)
Physical Therapy Evaluation Patient Details Name: Cassidy Larson MRN: [id number] DOB: [birth year] Today's Date: 04/22/2022  History of Present Illness  Pt is 66 yo female admitted 04/19/22 with recurrent chest pain and hypotension.  Pt with recent STEMI on 03/27/22.  Pt with cocaine + UDS - per cardiology likely cocaine related chest pain as troponins and EKG reassuring. PMH includes HTN, HLD, lupus, substance abuse, stent to mid LAD.  Clinical Impression  Pt admitted with above diagnosis. At baseline, pt lives alone and is independent but does reports hx of falls.  Pt unable to state nature or frequency of falls.  Reports feels better walking with RW in hospital.  Today, pt ambulated 100' with RW and min guard -did require 1 rest break due to fatigue. Due to hx of falls and easy fatigue recommend rollator at d/c.  Educated on rollator use and function. If unable to get rollator would recommend RW due to hx of falls.  Pt currently with functional limitations due to the deficits listed below (see PT Problem List). Pt will benefit from skilled PT to increase their independence and safety with mobility to allow discharge to the venue listed below.          Recommendations for follow up therapy are one component of a multi-disciplinary discharge planning process, led by the attending physician.  Recommendations may be updated based on patient status, additional functional criteria and insurance authorization.  Follow Up Recommendations No PT follow up      Assistance Recommended at Discharge PRN  Patient can return home with the following  Help with stairs or ramp for entrance    Equipment Recommendations Rollator (4 wheels)  Recommendations for Other Services       Functional Status Assessment Patient has had a recent decline in their functional status and demonstrates the ability to make significant improvements in function in a reasonable and predictable amount of time.     Precautions /  Restrictions Precautions Precautions: Fall      Mobility  Bed Mobility Overal bed mobility: Needs Assistance Bed Mobility: Supine to Sit, Sit to Supine     Supine to sit: Supervision Sit to supine: Supervision        Transfers Overall transfer level: Needs assistance Equipment used: None   Sit to Stand: Supervision                Ambulation/Gait Ambulation/Gait assistance: Min guard Gait Distance (Feet): 100 Feet Assistive device: Rolling walker (2 wheels) Gait Pattern/deviations: Decreased stride length Gait velocity: decreased     General Gait Details: slow guarded gait, reports slower gait for a while due to health issues ; needed 1 standing rest break; no LOB with RW use  Stairs            Wheelchair Mobility    Modified Rankin (Stroke Patients Only)       Balance Overall balance assessment: Needs assistance Sitting-balance support: No upper extremity supported Sitting balance-Leahy Scale: Good     Standing balance support: No upper extremity supported Standing balance-Leahy Scale: Fair Standing balance comment: RW to ambulate but could take a few steps without AD at EOB                             Pertinent Vitals/Pain Pain Assessment Pain Assessment: 0-10 Pain Score: 4  Pain Location: head and chest Pain Descriptors / Indicators: Aching Pain Intervention(s): Limited activity within patient's tolerance, Monitored during session  Home Living Family/patient expects to be discharged to:: Private residence Living Arrangements: Alone Available Help at Discharge:  (no assist) Type of Home: Apartment Home Access: Stairs to enter Entrance Stairs-Rails: None Entrance Stairs-Number of Steps: 3   Home Layout: One level Home Equipment: Cane - single point      Prior Function Prior Level of Function : Independent/Modified Independent             Mobility Comments: ambulates in community; occasionally uses cane; does  report hx of falls but unable to state how often or why (syncope, fatigue, tripping, etc) ADLs Comments: Does not drive ; independent with adls and iadls     Hand Dominance        Extremity/Trunk Assessment   Upper Extremity Assessment Upper Extremity Assessment: Overall WFL for tasks assessed    Lower Extremity Assessment Lower Extremity Assessment: Overall WFL for tasks assessed (Painful joints with MMT, reports chronic pain, but strength overall WFL)    Cervical / Trunk Assessment Cervical / Trunk Assessment: Normal  Communication   Communication: No difficulties  Cognition Arousal/Alertness: Awake/alert Behavior During Therapy: WFL for tasks assessed/performed Overall Cognitive Status: Within Functional Limits for tasks assessed                                          General Comments General comments (skin integrity, edema, etc.): VSS    Exercises     Assessment/Plan    PT Assessment Patient needs continued PT services  PT Problem List Decreased strength;Decreased cognition;Decreased activity tolerance;Cardiopulmonary status limiting activity;Decreased mobility;Decreased balance       PT Treatment Interventions DME instruction;Balance training;Gait training;Modalities;Stair training;Cognitive remediation;Functional mobility training;Patient/family education;Therapeutic activities;Therapeutic exercise    PT Goals (Current goals can be found in the Care Plan section)  Acute Rehab PT Goals Patient Stated Goal: to get back to feeling better PT Goal Formulation: With patient Time For Goal Achievement: 05/06/22 Potential to Achieve Goals: Good    Frequency Min 3X/week     Co-evaluation               AM-PAC PT "6 Clicks" Mobility  Outcome Measure Help needed turning from your back to your side while in a flat bed without using bedrails?: None Help needed moving from lying on your back to sitting on the side of a flat bed without using  bedrails?: None Help needed moving to and from a bed to a chair (including a wheelchair)?: A Little Help needed standing up from a chair using your arms (e.g., wheelchair or bedside chair)?: A Little Help needed to walk in hospital room?: A Little Help needed climbing 3-5 steps with a railing? : A Little 6 Click Score: 20    End of Session Equipment Utilized During Treatment: Gait belt Activity Tolerance: Patient tolerated treatment well Patient left: in bed;with call bell/phone within reach;with bed alarm set Nurse Communication: Mobility status PT Visit Diagnosis: History of falling (Z91.81);Other abnormalities of gait and mobility (R26.89)    Time: 2130-8657 PT Time Calculation (min) (ACUTE ONLY): 20 min   Charges:   PT Evaluation $PT Eval Low Complexity: 1 Low          Masen Salvas, PT Acute Rehab [facility] [phone]   Karlton Lemon 04/22/2022, 1:21 PM

## 2022-04-22 NOTE — Progress Notes (Signed)
  Echocardiogram 2D Echocardiogram has been performed.  Cassidy Larson 04/22/2022, 10:19 AM

## 2022-04-23 ENCOUNTER — Telehealth: Payer: Self-pay

## 2022-04-23 NOTE — Telephone Encounter (Signed)
Transition Care Management Unsuccessful Follow-up Telephone Call  Date of discharge and from where:  04/22/2022, [facility]  Attempts:  1st Attempt  Reason for unsuccessful TCM follow-up call:  Missing or invalid number-the patient has no phone number or contacts listed.   She has not been seen by [name], NP at [facility] since 01/2019 when she was there to establish care. She does not have MyChart.   Letter sent to patient requesting she contact [facility] to schedule a hospital follow up appointment as we have no way to reach her

## 2022-05-16 ENCOUNTER — Telehealth (HOSPITAL_COMMUNITY): Payer: Self-pay

## 2022-05-16 NOTE — Telephone Encounter (Signed)
No response from pt.  Closed referral  

## 2022-06-29 ENCOUNTER — Encounter (HOSPITAL_COMMUNITY): Payer: Self-pay

## 2022-06-29 ENCOUNTER — Emergency Department (HOSPITAL_COMMUNITY): Payer: Medicaid Other

## 2022-06-29 ENCOUNTER — Emergency Department (HOSPITAL_COMMUNITY)
Admission: EM | Admit: 2022-06-29 | Discharge: 2022-06-29 | Disposition: A | Discharge status: Home / Self Care | Payer: Medicaid Other | Attending: Emergency Medicine | Admitting: Emergency Medicine

## 2022-06-29 ENCOUNTER — Other Ambulatory Visit: Payer: Self-pay

## 2022-06-29 DIAGNOSIS — Z91040 Latex allergy status: Secondary | ICD-10-CM | POA: Insufficient documentation

## 2022-06-29 DIAGNOSIS — I2510 Atherosclerotic heart disease of native coronary artery without angina pectoris: Secondary | ICD-10-CM | POA: Diagnosis not present

## 2022-06-29 DIAGNOSIS — N189 Chronic kidney disease, unspecified: Secondary | ICD-10-CM | POA: Diagnosis not present

## 2022-06-29 DIAGNOSIS — Z955 Presence of coronary angioplasty implant and graft: Secondary | ICD-10-CM | POA: Diagnosis not present

## 2022-06-29 DIAGNOSIS — Z7982 Long term (current) use of aspirin: Secondary | ICD-10-CM | POA: Diagnosis not present

## 2022-06-29 DIAGNOSIS — R079 Chest pain, unspecified: Secondary | ICD-10-CM | POA: Insufficient documentation

## 2022-06-29 LAB — URINALYSIS, ROUTINE W REFLEX MICROSCOPIC
Bilirubin Urine: NEGATIVE
Glucose, UA: NEGATIVE mg/dL
Hgb urine dipstick: NEGATIVE
Ketones, ur: NEGATIVE mg/dL
Nitrite: NEGATIVE
Protein, ur: 100 mg/dL — AB
Specific Gravity, Urine: 1.023 (ref 1.005–1.030)
pH: 6 (ref 5.0–8.0)

## 2022-06-29 LAB — BASIC METABOLIC PANEL
Anion gap: 9 (ref 5–15)
BUN: 22 mg/dL (ref 8–23)
CO2: 26 mmol/L (ref 22–32)
Calcium: 9.1 mg/dL (ref 8.9–10.3)
Chloride: 103 mmol/L (ref 98–111)
Creatinine, Ser: 1.33 mg/dL — ABNORMAL HIGH (ref 0.44–1.00)
GFR, Estimated: 45 mL/min — ABNORMAL LOW (ref >60)
Glucose, Bld: 76 mg/dL (ref 70–99)
Potassium: 4.1 mmol/L (ref 3.5–5.1)
Sodium: 138 mmol/L (ref 135–145)

## 2022-06-29 LAB — RAPID URINE DRUG SCREEN, HOSP PERFORMED
Amphetamines: NOT DETECTED
Barbiturates: NOT DETECTED
Benzodiazepines: NOT DETECTED
Cocaine: POSITIVE — AB
Opiates: NOT DETECTED
Tetrahydrocannabinol: POSITIVE — AB

## 2022-06-29 LAB — TROPONIN I (HIGH SENSITIVITY)
Troponin I (High Sensitivity): 20 ng/L — ABNORMAL HIGH (ref <18)
Troponin I (High Sensitivity): 18 ng/L — ABNORMAL HIGH (ref <18)

## 2022-06-29 LAB — CBC
HCT: 39.6 % (ref 36.0–46.0)
Hemoglobin: 13 g/dL (ref 12.0–15.0)
MCH: 30.2 pg (ref 26.0–34.0)
MCHC: 32.8 g/dL (ref 30.0–36.0)
MCV: 91.9 fL (ref 80.0–100.0)
Platelets: 252 10*3/uL (ref 150–400)
RBC: 4.31 MIL/uL (ref 3.87–5.11)
RDW: 14 % (ref 11.5–15.5)
WBC: 5.5 10*3/uL (ref 4.0–10.5)
nRBC: 0 % (ref 0.0–0.2)

## 2022-06-29 MED ORDER — ISOSORBIDE MONONITRATE ER 30 MG PO TB24
30.0000 mg | ORAL_TABLET | Freq: Every day | ORAL | Status: DC
  Administered 2022-06-29: 30 mg via ORAL
  Filled 2022-06-29: qty 1

## 2022-06-29 MED ORDER — HYDRALAZINE HCL 25 MG PO TABS
25.0000 mg | ORAL_TABLET | Freq: Once | ORAL | Status: AC
  Administered 2022-06-29: 25 mg via ORAL
  Filled 2022-06-29: qty 1

## 2022-06-29 NOTE — ED Triage Notes (Signed)
Pt c/o non radiating chest pain, fatiguex83mos. Pt states she been stressed out and she is homeless and doesn't have anywhere to go, because she lost her place. Pt denies N/V, SOB

## 2022-06-29 NOTE — ED Notes (Signed)
Pt provided discharge instructions and prescription information. Pt was given the opportunity to ask questions and questions were answered.   Pt was provided with resources for food and shelter in the area. Pt also provided with a bus pass. Pt started yelling stating, "I want my own home. [employer] fucking [employer] took it from me. I am not calling these places, someone should do it for me. I have done enough." Pt then started cussing about the doctor and staff. Left pt to calm down and provided privacy for her to get dressed.

## 2022-06-29 NOTE — ED Provider Triage Note (Cosign Needed Addendum)
Emergency Medicine Provider Triage Evaluation Note  Cassidy Larson , a 67 y.o. female  was evaluated in triage.  Pt complains of chest pain off and on since November.  Patient has not taken her medications since being discharged from the hospital.  She does not have any shortness of breath.  She reports that she has been stressed living out on the streets.  She denies any shortness of breath.  No headaches or blurry vision..  Review of Systems  Positive:  Negative:   Physical Exam  BP (!) 208/116 (BP Location: Right Arm)   Pulse 73   Temp 97.6 F (36.4 C) (Oral)   Resp 16   Ht 5\' 2"  (1.575 m)   Wt 39 kg   SpO2 98%   BMI 15.73 kg/m  Gen:   Awake, no distress   Resp:  Normal effort  MSK:   Moves extremities without difficulty  Other:  No cranial nerve deficit noted.  Regular rate and rhythm.  Medical Decision Making  Medically screening exam initiated at 4:29 PM.  Appropriate orders placed.  Cassidy Larson was informed that the remainder of the evaluation will be completed by another provider, this initial triage assessment does not replace that evaluation, and the importance of remaining in the ED until their evaluation is complete.  Cardiac labs ordered. BP meds ordered.   Sherrell Puller, PA-C 06/29/22 1630    Sherrell Puller, [place] 06/29/22 [phone]

## 2022-06-29 NOTE — ED Provider Notes (Signed)
[facility] EMERGENCY DEPARTMENT Provider Note   CSN: [id number] Arrival date & time: 06/29/22  1541     History  Chief Complaint  Patient presents with   Chest Pain    Viktoriya Glaspy is a 67 y.o. female.  HPI   67 year old female with past medical history of CAD status post STEMI/stents, cocaine abuse, CKD, lupus presents to the emergency department with intermittent chest pain for the past 3 months.  Patient states this has been ongoing since she was discharged from here.  She has not been compliant with any of her medications since discharge because they "do not agree with her".  She states the chest pain is intermittent, not always with exertion.  There is no associated shortness of breath.  Denies any recent fever, cough, swelling of her lower extremities.  She denies any GI symptoms.  No headache or neurosymptoms.  She admits that she is homeless and has nowhere to go, she feels generally fatigued and overall weak.  Home Medications Prior to Admission medications   Medication Sig Start Date End Date Taking? Authorizing Provider  aspirin 81 MG chewable tablet Chew 81 mg by mouth daily.    [provider]  atorvastatin (LIPITOR) 80 MG tablet Take 1 tablet (80 mg total) by mouth daily. 04/01/22   Burnell Blanks, MD  hydrALAZINE (APRESOLINE) 25 MG tablet Take 1 tablet (25 mg total) by mouth every 8 (eight) hours. 04/01/22   Burnell Blanks, MD  isosorbide mononitrate (IMDUR) 30 MG 24 hr tablet Take 0.5 tablets (15 mg total) by mouth daily. 04/01/22   Burnell Blanks, MD  nitroGLYCERIN (NITROSTAT) 0.4 MG SL tablet Place 1 tablet (0.4 mg total) under the tongue every 5 (five) minutes as needed for chest pain. 04/01/22 04/01/23  Burnell Blanks, MD  sertraline (ZOLOFT) 25 MG tablet Take 1 tablet (25 mg total) by mouth daily. 04/22/22 06/21/22  Elodia Florence., MD  ticagrelor (BRILINTA) 90 MG TABS tablet Take 1 tablet (90  mg total) by mouth 2 (two) times daily. 04/01/22   Burnell Blanks, MD      Allergies    Asa [aspirin] and Latex    Review of Systems   Review of Systems  Constitutional:  Positive for fatigue. Negative for fever.  Respiratory:  Negative for chest tightness and shortness of breath.   Cardiovascular:  Positive for chest pain. Negative for palpitations and leg swelling.  Gastrointestinal:  Negative for abdominal pain, diarrhea and vomiting.  Genitourinary:  Negative for flank pain.  Musculoskeletal:  Negative for back pain.  Skin:  Negative for rash.  Neurological:  Negative for headaches.    Physical Exam Updated Vital Signs BP (!) 148/89   Pulse 65   Temp 97.6 F (36.4 C) (Oral)   Resp 16   Ht 5\' 2"  (1.575 m)   Wt 39 kg   SpO2 98%   BMI 15.73 kg/m  Physical Exam Vitals and nursing note reviewed.  Constitutional:      General: She is not in acute distress.    Appearance: Normal appearance. She is not ill-appearing.  HENT:     Head: Normocephalic.     Mouth/Throat:     Mouth: Mucous membranes are moist.  Cardiovascular:     Rate and Rhythm: Normal rate.  Pulmonary:     Effort: Pulmonary effort is normal. No respiratory distress.     Breath sounds: Examination of the right-lower field reveals decreased breath sounds. Examination of  the left-lower field reveals decreased breath sounds. Decreased breath sounds present.  Abdominal:     Palpations: Abdomen is soft.     Tenderness: There is no abdominal tenderness.  Musculoskeletal:     Right lower leg: No edema.     Left lower leg: No edema.  Skin:    General: Skin is warm.  Neurological:     Mental Status: She is alert and oriented to person, place, and time. Mental status is at baseline.  Psychiatric:        Mood and Affect: Mood normal.     ED Results / Procedures / Treatments   Labs (all labs ordered are listed, but only abnormal results are displayed) Labs Reviewed  BASIC METABOLIC PANEL -  Abnormal; Notable for the following components:      Result Value   Creatinine, Ser 1.33 (*)    GFR, Estimated 45 (*)    All other components within normal limits  TROPONIN I (HIGH SENSITIVITY) - Abnormal; Notable for the following components:   Troponin I (High Sensitivity) 20 (*)    All other components within normal limits  CBC  URINALYSIS, ROUTINE W REFLEX MICROSCOPIC  RAPID URINE DRUG SCREEN, HOSP PERFORMED  TROPONIN I (HIGH SENSITIVITY)    EKG EKG Interpretation  Date/Time:  Saturday June 29 2022 15:51:12 EST Ventricular Rate:  73 PR Interval:  130 QRS Duration: 120 QT Interval:  428 QTC Calculation: 471 R Axis:   61 Text Interpretation: Unusual P axis and short PR, probable junctional rhythm Minimal voltage criteria for LVH, may be normal variant ( Cornell product ) Inferior infarct , age undetermined Anteroseptal infarct , age undetermined Abnormal ECG When compared with ECG of 21-Apr-2022 11:25, PREVIOUS ECG IS PRESENT Similar to previous Confirmed by Lavenia Atlas [phone]) on 06/29/2022 7:22:10 PM  Radiology DG Chest 2 View  Result Date: 06/29/2022 CLINICAL DATA:  Chest pain for approximately 2 months. EXAM: CHEST - 2 VIEW COMPARISON:  None Available. FINDINGS: The heart size and mediastinal contours are within normal limits. Aortic atherosclerotic calcification incidentally noted. A thin linear radiodensity is seen on the frontal view overlying the superior mediastinum, which is not seen on the lateral view. This could represent an overlying object or foreign body. Both lungs are clear. No evidence of pneumothorax or pleural effusion. The visualized skeletal structures are unremarkable. IMPRESSION: Thin linear radiodensity overlying the superior mediastinum on the frontal view, which could represent an overlying object or foreign body. Clinical correlation is recommended. No active cardiopulmonary disease. Electronically Signed   By: Marlaine Hind M.D.   On: 06/29/2022 16:56     Procedures Procedures    Medications Ordered in ED Medications  isosorbide mononitrate (IMDUR) 24 hr tablet 30 mg (30 mg Oral Given 06/29/22 1638)  hydrALAZINE (APRESOLINE) tablet 25 mg (25 mg Oral Given 06/29/22 1638)    ED Course/ Medical Decision Making/ A&P                             Medical Decision Making Amount and/or Complexity of Data Reviewed Labs: ordered. Radiology: ordered.   67 year old female presents emergency department with intermittent chest pain.  Not Schn[place], seems atypical.  No pleuritic pain, no shortness of breath, no lower extremity swelling.  Vital signs are normal.  Low suspicion for PE.  EKG is baseline with no significant changes.  Chest x-ray shows no acute finding, there is a radiopaque object that appeared to be on the  chest at the time of the x-ray, they mention to rule out ingested foreign body and the patient denies this, no throat pain or difficulty swallowing.  Blood work is reassuring, troponin is baseline for the patient, slightly downtrending but no significant delta.  Low suspicion for dissection.  Patient at this time appears safe and stable for discharge and close outpatient follow up. Discharge plan and strict return to ED precautions discussed, patient verbalizes understanding and agreement.        Final Clinical Impression(s) / ED Diagnoses Final diagnoses:  None    Rx / DC Orders ED Discharge Orders     None         Lorelle Gibbs, DO 06/29/22 2128

## 2022-06-29 NOTE — Discharge Instructions (Addendum)
You have been seen and discharged from the emergency department.  Your cardiac workup was baseline for you.  Please refer to the packets of shelters.  Follow-up with your primary provider for further evaluation and further care. Take home medications as prescribed. If you have any worsening symptoms or further concerns for your health please return to an emergency department for further evaluation.

## 2022-06-29 NOTE — ED Notes (Signed)
In triage 

## 2022-06-30 ENCOUNTER — Other Ambulatory Visit: Payer: Self-pay

## 2022-06-30 ENCOUNTER — Emergency Department (HOSPITAL_COMMUNITY)
Admission: EM | Admit: 2022-06-30 | Discharge: 2022-06-30 | Disposition: A | Discharge status: Home / Self Care | Payer: Medicaid Other | Attending: Emergency Medicine | Admitting: Emergency Medicine

## 2022-06-30 DIAGNOSIS — N189 Chronic kidney disease, unspecified: Secondary | ICD-10-CM | POA: Insufficient documentation

## 2022-06-30 DIAGNOSIS — Z91040 Latex allergy status: Secondary | ICD-10-CM | POA: Insufficient documentation

## 2022-06-30 DIAGNOSIS — Z7982 Long term (current) use of aspirin: Secondary | ICD-10-CM | POA: Insufficient documentation

## 2022-06-30 DIAGNOSIS — R197 Diarrhea, unspecified: Secondary | ICD-10-CM | POA: Diagnosis present

## 2022-06-30 DIAGNOSIS — Z5900 Homelessness unspecified: Secondary | ICD-10-CM | POA: Diagnosis not present

## 2022-06-30 MED ORDER — LOPERAMIDE HCL 2 MG PO CAPS
2.0000 mg | ORAL_CAPSULE | Freq: Once | ORAL | Status: AC
  Administered 2022-06-30: 2 mg via ORAL
  Filled 2022-06-30: qty 1

## 2022-06-30 NOTE — ED Provider Triage Note (Signed)
Emergency Medicine Provider Triage Evaluation Note  Cassidy Larson , a 67 y.o. female  was evaluated in triage.  Pt complains of cough and feeling cold.  She was seen earlier today for chest pain.  States while sitting at the bus stop she was cold and started coughing so wanted to come back inside.  Currently homeless.  Review of Systems  Positive: Cold, cough Negative: fever  Physical Exam  BP (!) 153/100 (BP Location: Right Arm)   Pulse 88   Temp 97.6 F (36.4 C) (Oral)   Resp 16   SpO2 96%   Gen:   Awake, no distress   Resp:  Normal effort  MSK:   Moves extremities without difficulty  Other:  Bundled up in several layers of clothing  Medical Decision Making  Medically screening exam initiated at 12:16 AM.  Appropriate orders placed.  Sheri Gatchel was informed that the remainder of the evaluation will be completed by another provider, this initial triage assessment does not replace that evaluation, and the importance of remaining in the ED until their evaluation is complete.  Cough, cold.  Seen earlier today.  Currently homeless, started coughing and was cold as bus stop.  Work-up earlier today was reassuring, do not feel this needs to be repeated.   Larene Pickett, PA-C 06/30/22 [phone]

## 2022-06-30 NOTE — ED Triage Notes (Signed)
Pt arrives with c/o cough and back pain after sitting at the bus spot in the cold. Pt seen yesterday for CP. Pt is homeless and does not have anywhere to go.

## 2022-06-30 NOTE — ED Provider Notes (Signed)
[facility] EMERGENCY DEPARTMENT Provider Note   CSN: [id number] Arrival date & time: 06/30/22  0009     History  Chief Complaint  Patient presents with   Cough    Cassidy Larson is a 67 y.o. female.  Patient with history of ACS, MI, polysubstance abuse including cocaine, chronic kidney disease --presents to the emergency department after being seen yesterday for complaint of "cough and feeling cold", however on my exam she complains of diarrhea.  Seen yesterday for chest pain and had a reassuring workup.  This included BMP showing baseline creatinine, CBC unremarkable, troponin flat 20 > 18; UDS positive for cocaine and positive for THC.  She is currently sitting in a hallway bed in no distress.  She states that the blood pressure medication given to her yesterday (Imdur and hydralazine) caused diarrhea.  She states that she thinks that there was "aspirin in it".         Home Medications Prior to Admission medications   Medication Sig Start Date End Date Taking? Authorizing Provider  aspirin 81 MG chewable tablet Chew 81 mg by mouth daily.    [provider]  atorvastatin (LIPITOR) 80 MG tablet Take 1 tablet (80 mg total) by mouth daily. 04/01/22   Burnell Blanks, MD  hydrALAZINE (APRESOLINE) 25 MG tablet Take 1 tablet (25 mg total) by mouth every 8 (eight) hours. 04/01/22   Burnell Blanks, MD  isosorbide mononitrate (IMDUR) 30 MG 24 hr tablet Take 0.5 tablets (15 mg total) by mouth daily. 04/01/22   Burnell Blanks, MD  nitroGLYCERIN (NITROSTAT) 0.4 MG SL tablet Place 1 tablet (0.4 mg total) under the tongue every 5 (five) minutes as needed for chest pain. 04/01/22 04/01/23  Burnell Blanks, MD  sertraline (ZOLOFT) 25 MG tablet Take 1 tablet (25 mg total) by mouth daily. 04/22/22 06/21/22  Elodia Florence., MD  ticagrelor (BRILINTA) 90 MG TABS tablet Take 1 tablet (90 mg total) by mouth 2 (two) times daily. 04/01/22    Burnell Blanks, MD      Allergies    Asa [aspirin] and Latex    Review of Systems   Review of Systems  Physical Exam Updated Vital Signs BP (!) 153/100 (BP Location: Right Arm)   Pulse 88   Temp 97.6 F (36.4 C) (Oral)   Resp 16   Wt 38.6 kg   SpO2 96%   BMI 15.55 kg/m   Physical Exam Vitals and nursing note reviewed.  Constitutional:      Appearance: She is well-developed.  HENT:     Head: Normocephalic and atraumatic.     Right Ear: External ear normal.     Left Ear: External ear normal.     Nose: Nose normal.     Mouth/Throat:     Mouth: Mucous membranes are moist.  Eyes:     Conjunctiva/sclera: Conjunctivae normal.  Cardiovascular:     Rate and Rhythm: Normal rate.  Pulmonary:     Effort: No respiratory distress.     Breath sounds: No wheezing, rhonchi or rales.  Abdominal:     General: Abdomen is flat.     Tenderness: There is no abdominal tenderness. There is no guarding or rebound.  Musculoskeletal:     Cervical back: Normal range of motion and neck supple.  Skin:    General: Skin is warm and dry.  Neurological:     Mental Status: She is alert.     ED Results /  Procedures / Treatments   Labs (all labs ordered are listed, but only abnormal results are displayed) Labs Reviewed - No data to display  EKG None  Radiology DG Chest 2 View  Result Date: 06/29/2022 CLINICAL DATA:  Chest pain for approximately 2 months. EXAM: CHEST - 2 VIEW COMPARISON:  None Available. FINDINGS: The heart size and mediastinal contours are within normal limits. Aortic atherosclerotic calcification incidentally noted. A thin linear radiodensity is seen on the frontal view overlying the superior mediastinum, which is not seen on the lateral view. This could represent an overlying object or foreign body. Both lungs are clear. No evidence of pneumothorax or pleural effusion. The visualized skeletal structures are unremarkable. IMPRESSION: Thin linear radiodensity  overlying the superior mediastinum on the frontal view, which could represent an overlying object or foreign body. Clinical correlation is recommended. No active cardiopulmonary disease. Electronically Signed   By: Marlaine Hind M.D.   On: 06/29/2022 16:56    Procedures Procedures    Medications Ordered in ED Medications  loperamide (IMODIUM) capsule 2 mg (2 mg Oral Given 06/30/22 0808)    ED Course/ Medical Decision Making/ A&P    Patient seen and examined. History obtained directly from patient. Work-up including labs, imaging, EKG ordered in triage, if performed, were reviewed.    Labs/EKG: None ordered  Imaging: None ordered  Medications/Fluids: Ordered: Imodium, food  Most recent vital signs reviewed and are as follows: BP (!) 153/100 (BP Location: Right Arm)   Pulse 88   Temp 97.6 F (36.4 C) (Oral)   Resp 16   Wt 38.6 kg   SpO2 96%   BMI 15.55 kg/m   Initial impression: Homelessness, diarrhea  Home treatment plan: Continue fiber in diet as able, good hydration  Return instructions discussed with patient: The patient was urged to return to the Emergency Department immediately with worsening of current symptoms, worsening abdominal pain, persistent vomiting, blood noted in stools, fever, or any other concerns. The patient verbalized understanding.   Follow-up instructions discussed with patient: Follow-up with PCP as needed.                             Medical Decision Making Risk Prescription drug management.   Patient is here today complaining of diarrhea.  She was seen in the emergency yesterday for chest pain with reassuring workup.  Reported cough last night.  No respiratory distress at time of exam and chest x-ray yesterday was negative.  She looks comfortable and in no distress.  Care performed for the patient today and in the future is felt to be impacted by the following social determinants of health: homelessness, poor access to health care  The  patient's vital signs, pertinent lab work and imaging were reviewed and interpreted as discussed in the ED course. Hospitalization was considered for further testing, treatments, or serial exams/observation. However as patient is well-appearing, has a stable exam, and reassuring studies today, I do not feel that they warrant admission at this time. This plan was discussed with the patient who verbalizes agreement and comfort with this plan and seems reliable and able to return to the Emergency Department with worsening or changing symptoms.          Final Clinical Impression(s) / ED Diagnoses Final diagnoses:  Diarrhea, unspecified type    Rx / DC Orders ED Discharge Orders     None         Carlisle Cater, PA-C  06/30/22 6270    Blanchie Dessert, MD 06/30/22 [phone]

## 2022-06-30 NOTE — ED Notes (Signed)
Provided pt with bagged lunch, crackers and ice water.

## 2022-07-02 ENCOUNTER — Emergency Department (HOSPITAL_COMMUNITY): Payer: Medicaid Other

## 2022-07-02 ENCOUNTER — Inpatient Hospital Stay (HOSPITAL_COMMUNITY)
Admission: EM | Admit: 2022-07-02 | Discharge: 2022-07-08 | DRG: 391 | Disposition: A | Discharge status: Home / Self Care | Payer: Medicaid Other | Attending: Internal Medicine | Admitting: Internal Medicine

## 2022-07-02 DIAGNOSIS — R54 Age-related physical debility: Secondary | ICD-10-CM | POA: Diagnosis present

## 2022-07-02 DIAGNOSIS — I2510 Atherosclerotic heart disease of native coronary artery without angina pectoris: Secondary | ICD-10-CM | POA: Diagnosis present

## 2022-07-02 DIAGNOSIS — K7200 Acute and subacute hepatic failure without coma: Secondary | ICD-10-CM | POA: Diagnosis present

## 2022-07-02 DIAGNOSIS — N183 Chronic kidney disease, stage 3 unspecified: Secondary | ICD-10-CM | POA: Insufficient documentation

## 2022-07-02 DIAGNOSIS — Z833 Family history of diabetes mellitus: Secondary | ICD-10-CM

## 2022-07-02 DIAGNOSIS — Z823 Family history of stroke: Secondary | ICD-10-CM

## 2022-07-02 DIAGNOSIS — Z5900 Homelessness unspecified: Secondary | ICD-10-CM

## 2022-07-02 DIAGNOSIS — Z886 Allergy status to analgesic agent status: Secondary | ICD-10-CM

## 2022-07-02 DIAGNOSIS — E43 Unspecified severe protein-calorie malnutrition: Secondary | ICD-10-CM | POA: Insufficient documentation

## 2022-07-02 DIAGNOSIS — A0811 Acute gastroenteropathy due to Norwalk agent: Principal | ICD-10-CM

## 2022-07-02 DIAGNOSIS — E785 Hyperlipidemia, unspecified: Secondary | ICD-10-CM | POA: Diagnosis present

## 2022-07-02 DIAGNOSIS — N12 Tubulo-interstitial nephritis, not specified as acute or chronic: Secondary | ICD-10-CM | POA: Diagnosis present

## 2022-07-02 DIAGNOSIS — E861 Hypovolemia: Secondary | ICD-10-CM | POA: Diagnosis not present

## 2022-07-02 DIAGNOSIS — F419 Anxiety disorder, unspecified: Secondary | ICD-10-CM | POA: Diagnosis present

## 2022-07-02 DIAGNOSIS — I10 Essential (primary) hypertension: Secondary | ICD-10-CM | POA: Diagnosis present

## 2022-07-02 DIAGNOSIS — Z8249 Family history of ischemic heart disease and other diseases of the circulatory system: Secondary | ICD-10-CM

## 2022-07-02 DIAGNOSIS — R059 Cough, unspecified: Secondary | ICD-10-CM | POA: Insufficient documentation

## 2022-07-02 DIAGNOSIS — Z955 Presence of coronary angioplasty implant and graft: Secondary | ICD-10-CM

## 2022-07-02 DIAGNOSIS — R079 Chest pain, unspecified: Secondary | ICD-10-CM | POA: Diagnosis present

## 2022-07-02 DIAGNOSIS — I129 Hypertensive chronic kidney disease with stage 1 through stage 4 chronic kidney disease, or unspecified chronic kidney disease: Secondary | ICD-10-CM | POA: Diagnosis present

## 2022-07-02 DIAGNOSIS — E279 Disorder of adrenal gland, unspecified: Secondary | ICD-10-CM | POA: Diagnosis present

## 2022-07-02 DIAGNOSIS — F32A Depression, unspecified: Secondary | ICD-10-CM | POA: Diagnosis present

## 2022-07-02 DIAGNOSIS — Z681 Body mass index (BMI) 19 or less, adult: Secondary | ICD-10-CM

## 2022-07-02 DIAGNOSIS — R339 Retention of urine, unspecified: Secondary | ICD-10-CM | POA: Diagnosis present

## 2022-07-02 DIAGNOSIS — Z1152 Encounter for screening for COVID-19: Secondary | ICD-10-CM

## 2022-07-02 DIAGNOSIS — I252 Old myocardial infarction: Secondary | ICD-10-CM

## 2022-07-02 DIAGNOSIS — F1910 Other psychoactive substance abuse, uncomplicated: Secondary | ICD-10-CM | POA: Diagnosis present

## 2022-07-02 DIAGNOSIS — E278 Other specified disorders of adrenal gland: Secondary | ICD-10-CM

## 2022-07-02 DIAGNOSIS — M329 Systemic lupus erythematosus, unspecified: Secondary | ICD-10-CM | POA: Diagnosis present

## 2022-07-02 DIAGNOSIS — R748 Abnormal levels of other serum enzymes: Secondary | ICD-10-CM

## 2022-07-02 DIAGNOSIS — Z800 Family history of malignant neoplasm of digestive organs: Secondary | ICD-10-CM

## 2022-07-02 DIAGNOSIS — Z79899 Other long term (current) drug therapy: Secondary | ICD-10-CM

## 2022-07-02 DIAGNOSIS — Z91040 Latex allergy status: Secondary | ICD-10-CM

## 2022-07-02 DIAGNOSIS — Z66 Do not resuscitate: Secondary | ICD-10-CM | POA: Diagnosis present

## 2022-07-02 DIAGNOSIS — F1490 Cocaine use, unspecified, uncomplicated: Secondary | ICD-10-CM | POA: Diagnosis present

## 2022-07-02 DIAGNOSIS — R0981 Nasal congestion: Secondary | ICD-10-CM | POA: Insufficient documentation

## 2022-07-02 DIAGNOSIS — G8929 Other chronic pain: Secondary | ICD-10-CM | POA: Diagnosis present

## 2022-07-02 DIAGNOSIS — N184 Chronic kidney disease, stage 4 (severe): Secondary | ICD-10-CM | POA: Diagnosis present

## 2022-07-02 LAB — CBC
HCT: 36.3 % (ref 36.0–46.0)
HCT: 37.9 % (ref 36.0–46.0)
Hemoglobin: 11.9 g/dL — ABNORMAL LOW (ref 12.0–15.0)
Hemoglobin: 12.3 g/dL (ref 12.0–15.0)
MCH: 29.8 pg (ref 26.0–34.0)
MCH: 29.6 pg (ref 26.0–34.0)
MCHC: 32.8 g/dL (ref 30.0–36.0)
MCHC: 32.5 g/dL (ref 30.0–36.0)
MCV: 91 fL (ref 80.0–100.0)
MCV: 91.1 fL (ref 80.0–100.0)
Platelets: 213 10*3/uL (ref 150–400)
Platelets: 193 10*3/uL (ref 150–400)
RBC: 3.99 MIL/uL (ref 3.87–5.11)
RBC: 4.16 MIL/uL (ref 3.87–5.11)
RDW: 14.4 % (ref 11.5–15.5)
RDW: 13.8 % (ref 11.5–15.5)
WBC: 5.1 10*3/uL (ref 4.0–10.5)
WBC: 5.3 10*3/uL (ref 4.0–10.5)
nRBC: 0 % (ref 0.0–0.2)
nRBC: 0 % (ref 0.0–0.2)

## 2022-07-02 LAB — COMPREHENSIVE METABOLIC PANEL
ALT: 361 U/L — ABNORMAL HIGH (ref 0–44)
ALT: UNDETERMINED U/L (ref 0–44)
AST: 457 U/L — ABNORMAL HIGH (ref 15–41)
AST: UNDETERMINED U/L (ref 15–41)
Albumin: 1.9 g/dL — ABNORMAL LOW (ref 3.5–5.0)
Albumin: 2.6 g/dL — ABNORMAL LOW (ref 3.5–5.0)
Alkaline Phosphatase: 110 U/L (ref 38–126)
Alkaline Phosphatase: 155 U/L — ABNORMAL HIGH (ref 38–126)
Anion gap: 8 (ref 5–15)
Anion gap: 9 (ref 5–15)
BUN: 19 mg/dL (ref 8–23)
BUN: 19 mg/dL (ref 8–23)
CO2: 14 mmol/L — ABNORMAL LOW (ref 22–32)
CO2: 19 mmol/L — ABNORMAL LOW (ref 22–32)
Calcium: 5.9 mg/dL — CL (ref 8.9–10.3)
Calcium: 8.2 mg/dL — ABNORMAL LOW (ref 8.9–10.3)
Chloride: 115 mmol/L — ABNORMAL HIGH (ref 98–111)
Chloride: 110 mmol/L (ref 98–111)
Creatinine, Ser: 1.03 mg/dL — ABNORMAL HIGH (ref 0.44–1.00)
Creatinine, Ser: 1.2 mg/dL — ABNORMAL HIGH (ref 0.44–1.00)
GFR, Estimated: >60 mL/min (ref >60)
GFR, Estimated: 51 mL/min — ABNORMAL LOW (ref >60)
Glucose, Bld: 78 mg/dL (ref 70–99)
Glucose, Bld: 97 mg/dL (ref 70–99)
Potassium: 3.5 mmol/L (ref 3.5–5.1)
Potassium: 3.9 mmol/L (ref 3.5–5.1)
Sodium: 137 mmol/L (ref 135–145)
Sodium: 138 mmol/L (ref 135–145)
Total Bilirubin: 0.7 mg/dL (ref 0.3–1.2)
Total Bilirubin: 0.5 mg/dL (ref 0.3–1.2)
Total Protein: 4.7 g/dL — ABNORMAL LOW (ref 6.5–8.1)
Total Protein: 6.9 g/dL (ref 6.5–8.1)

## 2022-07-02 LAB — RESP PANEL BY RT-PCR (RSV, FLU A&B, COVID)  RVPGX2
Influenza A by PCR: NEGATIVE
Influenza B by PCR: NEGATIVE
Resp Syncytial Virus by PCR: NEGATIVE
SARS Coronavirus 2 by RT PCR: NEGATIVE

## 2022-07-02 LAB — CBC WITH DIFFERENTIAL/PLATELET
Abs Immature Granulocytes: 0.01 10*3/uL (ref 0.00–0.07)
Basophils Absolute: 0 10*3/uL (ref 0.0–0.1)
Basophils Relative: 0 %
Eosinophils Absolute: 0.1 10*3/uL (ref 0.0–0.5)
Eosinophils Relative: 3 %
HCT: 31.2 % — ABNORMAL LOW (ref 36.0–46.0)
Hemoglobin: 9.2 g/dL — ABNORMAL LOW (ref 12.0–15.0)
Immature Granulocytes: 0 %
Lymphocytes Relative: 19 %
Lymphs Abs: 0.6 10*3/uL — ABNORMAL LOW (ref 0.7–4.0)
MCH: 30 pg (ref 26.0–34.0)
MCHC: 29.5 g/dL — ABNORMAL LOW (ref 30.0–36.0)
MCV: 101.6 fL — ABNORMAL HIGH (ref 80.0–100.0)
Monocytes Absolute: 0.4 10*3/uL (ref 0.1–1.0)
Monocytes Relative: 13 %
Neutro Abs: 2.2 10*3/uL (ref 1.7–7.7)
Neutrophils Relative %: 65 %
Platelets: 141 10*3/uL — ABNORMAL LOW (ref 150–400)
RBC: 3.07 MIL/uL — ABNORMAL LOW (ref 3.87–5.11)
RDW: 14.6 % (ref 11.5–15.5)
WBC: 3.4 10*3/uL — ABNORMAL LOW (ref 4.0–10.5)
nRBC: 0 % (ref 0.0–0.2)

## 2022-07-02 LAB — TROPONIN I (HIGH SENSITIVITY)
Troponin I (High Sensitivity): 17 ng/L (ref <18)
Troponin I (High Sensitivity): 19 ng/L — ABNORMAL HIGH (ref <18)

## 2022-07-02 MED ORDER — ATORVASTATIN CALCIUM 80 MG PO TABS
80.0000 mg | ORAL_TABLET | Freq: Every day | ORAL | Status: DC
  Administered 2022-07-03 – 2022-07-04 (×2): 80 mg via ORAL
  Filled 2022-07-02 (×2): qty 2
  Filled 2022-07-02: qty 1

## 2022-07-02 MED ORDER — ACETAMINOPHEN 325 MG PO TABS
650.0000 mg | ORAL_TABLET | ORAL | Status: DC | PRN
  Administered 2022-07-03 – 2022-07-04 (×2): 650 mg via ORAL
  Filled 2022-07-02 (×2): qty 2

## 2022-07-02 MED ORDER — ZOLPIDEM TARTRATE 5 MG PO TABS: 5.0000 mg | ORAL_TABLET | Freq: Every evening | ORAL | Status: DC | PRN

## 2022-07-02 MED ORDER — SERTRALINE HCL 25 MG PO TABS
25.0000 mg | ORAL_TABLET | Freq: Every day | ORAL | Status: DC
  Administered 2022-07-03 – 2022-07-08 (×6): 25 mg via ORAL
  Filled 2022-07-02 (×7): qty 1

## 2022-07-02 MED ORDER — NICOTINE 21 MG/24HR TD PT24
21.0000 mg | MEDICATED_PATCH | Freq: Every day | TRANSDERMAL | Status: DC
  Administered 2022-07-08: 21 mg via TRANSDERMAL
  Filled 2022-07-02 (×3): qty 1

## 2022-07-02 MED ORDER — TICAGRELOR 90 MG PO TABS
90.0000 mg | ORAL_TABLET | Freq: Two times a day (BID) | ORAL | Status: DC
  Administered 2022-07-03 – 2022-07-08 (×12): 90 mg via ORAL
  Filled 2022-07-02 (×14): qty 1

## 2022-07-02 NOTE — ED Notes (Signed)
Patient cleaned up of fecal incontinence and peri care performed.

## 2022-07-02 NOTE — ED Triage Notes (Signed)
Patient picked up at the [facility] [place] where she was found outside by a by standard. Per EMS patient complains of cp and has a history of cardiac stent placement. Per EMS patient took 2 nitro at the same time to help with her CP. Patient found stumbling and EMS report 70/30 HR 55. EMS reports cyanosis around the lips and fingers. Patient has received 1L IVF with EMS. Patient sp02 94%. Patient seen in the hospital 2 days ago for diarrhea. EMS reports concern for hypovolemic shock. Patient alert and oriented with EMS but does report dizziness and weakness.

## 2022-07-02 NOTE — ED Provider Notes (Addendum)
[facility] EMERGENCY DEPARTMENT Provider Note   CSN: [id number] Arrival date & time: 07/02/22  1631     History  No chief complaint on file.   Cassidy Larson is a 67 y.o. female.  HPI Patient presents with chest pain.  Reportedly had chest pain.  Had been placed outside and she is homeless.  Reportedly took 2 nitroglycerin to help with the chest pain and found to be high stumbling and a pressure of 70/30.  Reportedly also took her medicines and has had diarrhea the last few days.  States the medicines give her diarrhea because they have aspirin in it.  Recent visit to the ER for diarrhea.  Also recent STEMI in October with stent to LAD.  Has had visits to the ER since with chest pain.  History of cocaine abuse but denies using the last couple days.    Home Medications Prior to Admission medications   Medication Sig Start Date End Date Taking? Authorizing Provider  aspirin 81 MG chewable tablet Chew 81 mg by mouth daily.    [provider]  atorvastatin (LIPITOR) 80 MG tablet Take 1 tablet (80 mg total) by mouth daily. 04/01/22   Burnell Blanks, MD  hydrALAZINE (APRESOLINE) 25 MG tablet Take 1 tablet (25 mg total) by mouth every 8 (eight) hours. 04/01/22   Burnell Blanks, MD  isosorbide mononitrate (IMDUR) 30 MG 24 hr tablet Take 0.5 tablets (15 mg total) by mouth daily. 04/01/22   Burnell Blanks, MD  nitroGLYCERIN (NITROSTAT) 0.4 MG SL tablet Place 1 tablet (0.4 mg total) under the tongue every 5 (five) minutes as needed for chest pain. 04/01/22 04/01/23  Burnell Blanks, MD  sertraline (ZOLOFT) 25 MG tablet Take 1 tablet (25 mg total) by mouth daily. 04/22/22 06/21/22  Elodia Florence., MD  ticagrelor (BRILINTA) 90 MG TABS tablet Take 1 tablet (90 mg total) by mouth 2 (two) times daily. 04/01/22   Burnell Blanks, MD      Allergies    Asa [aspirin] and Latex    Review of Systems   Review of  Systems  Physical Exam Updated Vital Signs BP 138/74   Pulse 61   Temp (!) 97.2 F (36.2 C) (Rectal)   Resp 15   SpO2 100%  Physical Exam Vitals and nursing note reviewed.  HENT:     Nose:     Comments: Mild nasal drainage from right side. Cardiovascular:     Rate and Rhythm: Regular rhythm.  Pulmonary:     Breath sounds: No wheezing or rhonchi.  Abdominal:     Tenderness: There is no abdominal tenderness.  Musculoskeletal:     Cervical back: Neck supple.  Skin:    General: Skin is warm.     Capillary Refill: Capillary refill takes less than 2 seconds.  Neurological:     Mental Status: She is alert and oriented to person, place, and time.     ED Results / Procedures / Treatments   Labs (all labs ordered are listed, but only abnormal results are displayed) Labs Reviewed  COMPREHENSIVE METABOLIC PANEL - Abnormal; Notable for the following components:      Result Value   Chloride 115 (*)    CO2 14 (*)    Creatinine, Ser 1.03 (*)    Calcium 5.9 (*)    Total Protein 4.7 (*)    Albumin 1.9 (*)    AST 457 (*)    ALT 361 (*)  All other components within normal limits  CBC WITH DIFFERENTIAL/PLATELET - Abnormal; Notable for the following components:   WBC 3.4 (*)    RBC 3.07 (*)    Hemoglobin 9.2 (*)    HCT 31.2 (*)    MCV 101.6 (*)    MCHC 29.5 (*)    Platelets 141 (*)    Lymphs Abs 0.6 (*)    All other components within normal limits  CBC - Abnormal; Notable for the following components:   Hemoglobin 11.9 (*)    All other components within normal limits  RESP PANEL BY RT-PCR (RSV, FLU A&B, COVID)  RVPGX2  CBC  COMPREHENSIVE METABOLIC PANEL  TROPONIN I (HIGH SENSITIVITY)  TROPONIN I (HIGH SENSITIVITY)    EKG EKG Interpretation  Date/Time:  Tuesday July 02 2022 16:45:45 EST Ventricular Rate:  59 PR Interval:  108 QRS Duration: 79 QT Interval:  427 QTC Calculation: 423 R Axis:   79 Text Interpretation: Sinus rhythm Short PR interval Borderline  repolarization abnormality No significant change since last tracing Confirmed by Davonna Belling [phone]) on 07/02/2022 7:13:18 PM  Radiology DG Chest Portable 1 View  Result Date: 07/02/2022 CLINICAL DATA:  Chest pain. EXAM: PORTABLE CHEST 1 VIEW COMPARISON:  06/29/2022 FINDINGS: The cardiac silhouette, mediastinal and hilar contours are within normal limits. Stable calcification of the thoracic aorta. The lungs are clear of an acute process. No infiltrates, edema or effusions. No pulmonary lesions. Bilateral nipple shadows again noted. Age advanced bilateral carotid artery calcifications are noted. IMPRESSION: 1. No acute cardiopulmonary findings. 2. Age advanced bilateral carotid artery calcifications. Electronically Signed   By: Marijo Sanes M.D.   On: 07/02/2022 17:48    Procedures Procedures    Medications Ordered in ED Medications - No data to display  ED Course/ Medical Decision Making/ A&P                             Medical Decision Making Amount and/or Complexity of Data Reviewed Labs: ordered. Radiology: ordered.   Patient with hypotension.  Had chest pain which is not unusual for patient.  Did have somewhat recent STEMI but has had episodic chest pain since.  Also has had diarrhea.  Denies good oral intake and has had diarrhea.  Had hypotension after taking nitroglycerin.  Blood pressure improved with some fluid for EMS.  Differential diagnosis includes dehydration, less likely coronary artery disease/STEMI but will evaluate.  Will get x-ray and basic blood work.  Reviewed cath report recent ER visit and recent admission to hospital. Blood pressures improved.  However lab work does show some abnormalities.  New anemia.  Worsening thrombocytopenia.  However LFTs teas are elevated and severe hypocalcemia.This may not be accurate due to hemolysis and potentially dilution.  Attempting to recheck the labs.  However appears to be somewhat difficult to get a good sample.  CMP still  pending but CBC improved on the recheck.  Care turned over to Dr Christy Gentles.   Patient now states she wants nursing home placement.  CMP has partially resulted.  Improved from prior which puts it out on the validity of prior labs.  However they said the AST and ALT are not going to result and will need to be redrawn.   Final Clinical Impression(s) / ED Diagnoses Final diagnoses:  Chest pain, unspecified type    Rx / DC Orders ED Discharge Orders     None  Davonna Belling, MD 07/02/22 2155    Davonna Belling, MD 07/02/22 Waymond Cera    Davonna Belling, MD 07/02/22 Waymond Cera    Davonna Belling, MD 07/02/22 2258

## 2022-07-02 NOTE — ED Notes (Signed)
Lab called critical calcium 5.9 at 1837, Dr Alvino Chapel was made aware

## 2022-07-02 NOTE — ED Provider Notes (Signed)
Patient requesting nursing home placement.  TOC orders have been placed.  Home meds have been ordered, but will defer nitrates for now due to her recent hypotension.  Her vital signs have since normalized BP 129/72   Pulse (!) 57   Temp 97.9 F (36.6 C) (Oral)   Resp 15   SpO2 100%     Ripley Fraise, MD 07/03/22 0000

## 2022-07-03 ENCOUNTER — Emergency Department (HOSPITAL_COMMUNITY): Payer: Medicaid Other

## 2022-07-03 ENCOUNTER — Other Ambulatory Visit: Payer: Self-pay

## 2022-07-03 ENCOUNTER — Inpatient Hospital Stay (HOSPITAL_COMMUNITY): Payer: Medicaid Other

## 2022-07-03 ENCOUNTER — Encounter (HOSPITAL_COMMUNITY): Payer: Self-pay | Admitting: Internal Medicine

## 2022-07-03 DIAGNOSIS — I252 Old myocardial infarction: Secondary | ICD-10-CM | POA: Diagnosis not present

## 2022-07-03 DIAGNOSIS — Z79899 Other long term (current) drug therapy: Secondary | ICD-10-CM | POA: Diagnosis not present

## 2022-07-03 DIAGNOSIS — Z8249 Family history of ischemic heart disease and other diseases of the circulatory system: Secondary | ICD-10-CM | POA: Diagnosis not present

## 2022-07-03 DIAGNOSIS — E279 Disorder of adrenal gland, unspecified: Secondary | ICD-10-CM | POA: Diagnosis present

## 2022-07-03 DIAGNOSIS — Z5900 Homelessness unspecified: Secondary | ICD-10-CM | POA: Diagnosis not present

## 2022-07-03 DIAGNOSIS — N184 Chronic kidney disease, stage 4 (severe): Secondary | ICD-10-CM | POA: Diagnosis present

## 2022-07-03 DIAGNOSIS — F1910 Other psychoactive substance abuse, uncomplicated: Secondary | ICD-10-CM | POA: Diagnosis not present

## 2022-07-03 DIAGNOSIS — E785 Hyperlipidemia, unspecified: Secondary | ICD-10-CM | POA: Diagnosis present

## 2022-07-03 DIAGNOSIS — Z886 Allergy status to analgesic agent status: Secondary | ICD-10-CM | POA: Diagnosis not present

## 2022-07-03 DIAGNOSIS — A0811 Acute gastroenteropathy due to Norwalk agent: Secondary | ICD-10-CM | POA: Diagnosis not present

## 2022-07-03 DIAGNOSIS — N12 Tubulo-interstitial nephritis, not specified as acute or chronic: Secondary | ICD-10-CM

## 2022-07-03 DIAGNOSIS — I129 Hypertensive chronic kidney disease with stage 1 through stage 4 chronic kidney disease, or unspecified chronic kidney disease: Secondary | ICD-10-CM | POA: Diagnosis present

## 2022-07-03 DIAGNOSIS — I2510 Atherosclerotic heart disease of native coronary artery without angina pectoris: Secondary | ICD-10-CM | POA: Diagnosis present

## 2022-07-03 DIAGNOSIS — M329 Systemic lupus erythematosus, unspecified: Secondary | ICD-10-CM | POA: Diagnosis present

## 2022-07-03 DIAGNOSIS — R54 Age-related physical debility: Secondary | ICD-10-CM | POA: Diagnosis present

## 2022-07-03 DIAGNOSIS — E861 Hypovolemia: Secondary | ICD-10-CM | POA: Diagnosis not present

## 2022-07-03 DIAGNOSIS — Z955 Presence of coronary angioplasty implant and graft: Secondary | ICD-10-CM | POA: Diagnosis not present

## 2022-07-03 DIAGNOSIS — R748 Abnormal levels of other serum enzymes: Secondary | ICD-10-CM | POA: Diagnosis present

## 2022-07-03 DIAGNOSIS — F32A Depression, unspecified: Secondary | ICD-10-CM | POA: Diagnosis present

## 2022-07-03 DIAGNOSIS — G8929 Other chronic pain: Secondary | ICD-10-CM | POA: Diagnosis present

## 2022-07-03 DIAGNOSIS — F1490 Cocaine use, unspecified, uncomplicated: Secondary | ICD-10-CM | POA: Diagnosis present

## 2022-07-03 DIAGNOSIS — Z681 Body mass index (BMI) 19 or less, adult: Secondary | ICD-10-CM | POA: Diagnosis not present

## 2022-07-03 DIAGNOSIS — E43 Unspecified severe protein-calorie malnutrition: Secondary | ICD-10-CM | POA: Diagnosis present

## 2022-07-03 DIAGNOSIS — Z1152 Encounter for screening for COVID-19: Secondary | ICD-10-CM | POA: Diagnosis not present

## 2022-07-03 DIAGNOSIS — Z66 Do not resuscitate: Secondary | ICD-10-CM | POA: Diagnosis present

## 2022-07-03 DIAGNOSIS — K7200 Acute and subacute hepatic failure without coma: Secondary | ICD-10-CM | POA: Diagnosis present

## 2022-07-03 LAB — HEPATITIS PANEL, ACUTE
HCV Ab: NONREACTIVE
Hep A IgM: NONREACTIVE
Hep B C IgM: NONREACTIVE
Hepatitis B Surface Ag: NONREACTIVE

## 2022-07-03 LAB — GASTROINTESTINAL PANEL BY PCR, STOOL (REPLACES STOOL CULTURE)

## 2022-07-03 LAB — COMPREHENSIVE METABOLIC PANEL
ALT: 341 U/L — ABNORMAL HIGH (ref 0–44)
ALT: 447 U/L — ABNORMAL HIGH (ref 0–44)
AST: 235 U/L — ABNORMAL HIGH (ref 15–41)
AST: 439 U/L — ABNORMAL HIGH (ref 15–41)
Albumin: 2.5 g/dL — ABNORMAL LOW (ref 3.5–5.0)
Albumin: 2.6 g/dL — ABNORMAL LOW (ref 3.5–5.0)
Alkaline Phosphatase: 159 U/L — ABNORMAL HIGH (ref 38–126)
Alkaline Phosphatase: 173 U/L — ABNORMAL HIGH (ref 38–126)
Anion gap: 7 (ref 5–15)
Anion gap: 8 (ref 5–15)
BUN: 15 mg/dL (ref 8–23)
BUN: 18 mg/dL (ref 8–23)
CO2: 18 mmol/L — ABNORMAL LOW (ref 22–32)
CO2: 20 mmol/L — ABNORMAL LOW (ref 22–32)
Calcium: 8.2 mg/dL — ABNORMAL LOW (ref 8.9–10.3)
Calcium: 8.2 mg/dL — ABNORMAL LOW (ref 8.9–10.3)
Chloride: 110 mmol/L (ref 98–111)
Chloride: 110 mmol/L (ref 98–111)
Creatinine, Ser: 1.18 mg/dL — ABNORMAL HIGH (ref 0.44–1.00)
Creatinine, Ser: 1.3 mg/dL — ABNORMAL HIGH (ref 0.44–1.00)
GFR, Estimated: 46 mL/min — ABNORMAL LOW (ref >60)
GFR, Estimated: 52 mL/min — ABNORMAL LOW (ref >60)
Glucose, Bld: 90 mg/dL (ref 70–99)
Glucose, Bld: 92 mg/dL (ref 70–99)
Potassium: 3.7 mmol/L (ref 3.5–5.1)
Potassium: 3.9 mmol/L (ref 3.5–5.1)
Sodium: 136 mmol/L (ref 135–145)
Sodium: 137 mmol/L (ref 135–145)
Total Bilirubin: 0.5 mg/dL (ref 0.3–1.2)
Total Bilirubin: 0.6 mg/dL (ref 0.3–1.2)
Total Protein: 6.8 g/dL (ref 6.5–8.1)
Total Protein: 7.1 g/dL (ref 6.5–8.1)

## 2022-07-03 LAB — ACETAMINOPHEN LEVEL: Acetaminophen (Tylenol), Serum: 17 ug/mL (ref 10–30)

## 2022-07-03 LAB — URINALYSIS, ROUTINE W REFLEX MICROSCOPIC
Bilirubin Urine: NEGATIVE
Glucose, UA: NEGATIVE mg/dL
Hgb urine dipstick: NEGATIVE
Ketones, ur: NEGATIVE mg/dL
Leukocytes,Ua: NEGATIVE
Nitrite: NEGATIVE
Protein, ur: NEGATIVE mg/dL
Specific Gravity, Urine: >1.046 — ABNORMAL HIGH (ref 1.005–1.030)
pH: 5 (ref 5.0–8.0)

## 2022-07-03 LAB — PROTIME-INR
INR: 1.2 (ref 0.8–1.2)
Prothrombin Time: 15.3 seconds — ABNORMAL HIGH (ref 11.4–15.2)

## 2022-07-03 LAB — C DIFFICILE QUICK SCREEN W PCR REFLEX
C Diff antigen: NEGATIVE
C Diff interpretation: NOT DETECTED
C Diff toxin: NEGATIVE

## 2022-07-03 LAB — TROPONIN I (HIGH SENSITIVITY): Troponin I (High Sensitivity): 22 ng/L — ABNORMAL HIGH (ref <18)

## 2022-07-03 LAB — CK: Total CK: 65 U/L (ref 38–234)

## 2022-07-03 LAB — AMMONIA: Ammonia: 24 umol/L (ref 9–35)

## 2022-07-03 LAB — ETHANOL: Alcohol, Ethyl (B): <10 mg/dL (ref <10)

## 2022-07-03 MED ORDER — ENOXAPARIN SODIUM 30 MG/0.3ML IJ SOSY
30.0000 mg | PREFILLED_SYRINGE | INTRAMUSCULAR | Status: DC
  Administered 2022-07-03 – 2022-07-07 (×5): 30 mg via SUBCUTANEOUS
  Filled 2022-07-03 (×5): qty 0.3

## 2022-07-03 MED ORDER — ISOSORBIDE MONONITRATE ER 30 MG PO TB24
15.0000 mg | ORAL_TABLET | Freq: Every day | ORAL | Status: DC
  Administered 2022-07-03: 15 mg via ORAL
  Filled 2022-07-03 (×2): qty 1

## 2022-07-03 MED ORDER — ORAL CARE MOUTH RINSE: 15.0000 mL | OROMUCOSAL | Status: DC | PRN

## 2022-07-03 MED ORDER — ASPIRIN 81 MG PO TBEC
81.0000 mg | DELAYED_RELEASE_TABLET | Freq: Every day | ORAL | Status: DC
  Administered 2022-07-03 – 2022-07-08 (×6): 81 mg via ORAL
  Filled 2022-07-03 (×6): qty 1

## 2022-07-03 MED ORDER — HYDRALAZINE HCL 25 MG PO TABS
25.0000 mg | ORAL_TABLET | Freq: Three times a day (TID) | ORAL | Status: DC
  Administered 2022-07-03 (×2): 25 mg via ORAL
  Filled 2022-07-03 (×2): qty 1

## 2022-07-03 MED ORDER — LOPERAMIDE HCL 2 MG PO CAPS
2.0000 mg | ORAL_CAPSULE | Freq: Once | ORAL | Status: AC
  Administered 2022-07-03: 2 mg via ORAL
  Filled 2022-07-03: qty 1

## 2022-07-03 MED ORDER — NOREPINEPHRINE 4 MG/250ML-% IV SOLN
INTRAVENOUS | Status: AC
  Filled 2022-07-03: qty 250

## 2022-07-03 MED ORDER — SODIUM CHLORIDE 0.9 % IV SOLN
1.0000 g | INTRAVENOUS | Status: DC
  Administered 2022-07-03: 1 g via INTRAVENOUS
  Filled 2022-07-03: qty 10

## 2022-07-03 MED ORDER — IOHEXOL 350 MG/ML SOLN
75.0000 mL | Freq: Once | INTRAVENOUS | Status: AC | PRN
  Administered 2022-07-03: 75 mL via INTRAVENOUS

## 2022-07-03 NOTE — ED Provider Notes (Signed)
My assessment, patient was having diffuse abdominal pain and diarrhea.  CT imaging was performed which does not reveal any acute process.  She is still having diarrhea, stool studies will be ordered.  She was otherwise stable at this time.  Awaiting TOC evaluation   Ripley Fraise, MD 07/03/22 [phone]

## 2022-07-03 NOTE — ED Notes (Signed)
ED TO INPATIENT HANDOFF REPORT  ED Nurse Name and Phone #: Richardson Landry [phone]  S Name/Age/Gender Cassidy Larson 67 y.o. female Room/Bed: 046C/046C  Code Status   Code Status: DNR  Home/SNF/Other Home Patient oriented to: self, place, time, and situation Is this baseline? Yes   Triage Complete: Triage complete  Chief Complaint Pyelonephritis [N12]  Triage Note Patient picked up at the [facility] downtown where she was found outside by a by standard. Per EMS patient complains of cp and has a history of cardiac stent placement. Per EMS patient took 2 nitro at the same time to help with her CP. Patient found stumbling and EMS report 70/30 HR 55. EMS reports cyanosis around the lips and fingers. Patient has received 1L IVF with EMS. Patient sp02 94%. Patient seen in the hospital 2 days ago for diarrhea. EMS reports concern for hypovolemic shock. Patient alert and oriented with EMS but does report dizziness and weakness.    Allergies Allergies  Allergen Reactions   Asa [Aspirin] Diarrhea, Nausea And Vomiting and Other (See Comments)    Severe stomach cramps Diarrhea (sometimes with blood)   Latex Itching    Level of Care/Admitting Diagnosis ED Disposition     ED Disposition  Admit   Condition  --   Comment  Hospital Area: [place] [[id number]]  Level of Care: Med-Surg [16]  May admit patient to [facility] or [facility] if equivalent level of care is available:: No  Covid Evaluation: Asymptomatic - no recent exposure (last 10 days) testing not required  Diagnosis: Pyelonephritis [381017]  Admitting Physician: Aldine Contes [[id number]]  Attending Physician: Aldine Contes [[id number]]  Certification:: I certify this patient will need inpatient services for at least 2 midnights  Estimated Length of Stay: 5          B Medical/Surgery History Past Medical History:  Diagnosis Date   Anxiety    CAD in native artery residual post LAD stent, 100% RCA and 80%  LCx.  03/30/2022   CKD (chronic kidney disease) stage 4, GFR 15-29 ml/min (HCC)    Cocaine abuse ([place])    Essential hypertension    HLD (hyperlipidemia) 03/30/2022   Lupus ([place])    On mechanically assisted ventilation ([place]) extubated 03/28/22  03/28/2022   Polysubstance abuse ([place])    Positive ANA (antinuclear antibody)    S/P angioplasty with stent to mLAD 03/27/22 03/30/2022   Past Surgical History:  Procedure Laterality Date   CORONARY/GRAFT ACUTE MI REVASCULARIZATION N/A 03/27/2022   Procedure: Coronary/Graft Acute MI Revascularization;  Surgeon: Burnell Blanks, MD;  Location: [place] [facility];  Service: Cardiovascular;  Laterality: N/A;   LEFT HEART CATH AND CORONARY ANGIOGRAPHY N/A 03/27/2022   Procedure: LEFT HEART CATH AND CORONARY ANGIOGRAPHY;  Surgeon: Burnell Blanks, MD;  Location: [place] [facility];  Service: Cardiovascular;  Laterality: N/A;     A IV Location/Drains/Wounds Patient Lines/Drains/Airways Status     Active Line/Drains/Airways     Name Placement date Placement time Site Days   Peripheral IV 07/02/22 16 G Anterior;Left;Proximal Forearm 07/02/22  1639  Forearm  1            Intake/Output Last 24 hours No intake or output data in the 24 hours ending 07/03/22 1342  Labs/Imaging Results for orders placed or performed during the hospital encounter of 07/02/22 (from the past 48 hour(s))  Comprehensive metabolic panel     Status: Abnormal   Collection Time: 07/02/22  4:49 PM  Result Value  Ref Range   Sodium 137 135 - 145 mmol/L   Potassium 3.5 3.5 - 5.1 mmol/L    Comment: HEMOLYSIS AT THIS LEVEL MAY AFFECT RESULT   Chloride 115 (H) 98 - 111 mmol/L   CO2 14 (L) 22 - 32 mmol/L   Glucose, Bld 78 70 - 99 mg/dL    Comment: Glucose reference range applies only to samples taken after fasting for at least 8 hours.   BUN 19 8 - 23 mg/dL   Creatinine, Ser 1.03 (H) 0.44 - 1.00 mg/dL   Calcium 5.9 (LL) 8.9 - 10.3 mg/dL    Comment: REPEATED  TO VERIFY CRITICAL RESULT CALLED TO, READ BACK BY AND VERIFIED WITH: Tonita Cong 1837 07/02/2022 WBOND    Total Protein 4.7 (L) 6.5 - 8.1 g/dL   Albumin 1.9 (L) 3.5 - 5.0 g/dL   AST 457 (H) 15 - 41 U/L    Comment: HEMOLYSIS AT THIS LEVEL MAY AFFECT RESULT   ALT 361 (H) 0 - 44 U/L    Comment: HEMOLYSIS AT THIS LEVEL MAY AFFECT RESULT   Alkaline Phosphatase 110 38 - 126 U/L   Total Bilirubin 0.7 0.3 - 1.2 mg/dL    Comment: HEMOLYSIS AT THIS LEVEL MAY AFFECT RESULT   GFR, Estimated >60 >60 mL/min    Comment: (NOTE) Calculated using the CKD-EPI Creatinine Equation (2021)    Anion gap 8 5 - 15    Comment: Performed at [facility], [place] [address] [place], [place] [address]  CBC with Differential     Status: Abnormal   Collection Time: 07/02/22  4:49 PM  Result Value Ref Range   WBC 3.4 (L) 4.0 - 10.5 K/uL   RBC 3.07 (L) 3.87 - 5.11 MIL/uL   Hemoglobin 9.2 (L) 12.0 - 15.0 g/dL   HCT 31.2 (L) 36.0 - 46.0 %   MCV 101.6 (H) 80.0 - 100.0 fL   MCH 30.0 26.0 - 34.0 pg   MCHC 29.5 (L) 30.0 - 36.0 g/dL   RDW 14.6 11.5 - 15.5 %   Platelets 141 (L) 150 - 400 K/uL   nRBC 0.0 0.0 - 0.2 %   Neutrophils Relative % 65 %   Neutro Abs 2.2 1.7 - 7.7 K/uL   Lymphocytes Relative 19 %   Lymphs Abs 0.6 (L) 0.7 - 4.0 K/uL   Monocytes Relative 13 %   Monocytes Absolute 0.4 0.1 - 1.0 K/uL   Eosinophils Relative 3 %   Eosinophils Absolute 0.1 0.0 - 0.5 K/uL   Basophils Relative 0 %   Basophils Absolute 0.0 0.0 - 0.1 K/uL   Immature Granulocytes 0 %   Abs Immature Granulocytes 0.01 0.00 - 0.07 K/uL    Comment: Performed at [facility], [place] [address], [place], [place] [address]  Troponin I (High Sensitivity)     Status: None   Collection Time: 07/02/22  4:49 PM  Result Value Ref Range   Troponin I (High Sensitivity) 17 <18 ng/L    Comment: (NOTE) Elevated high sensitivity troponin I (hsTnI) values and significant  changes across serial measurements may suggest ACS but many other   chronic and acute conditions are known to elevate hsTnI results.  Refer to the Links section for chest pain algorithms and additional  guidance. Performed at [facility], [place] [address] [place], [place] [address]   Resp panel by RT-PCR (RSV, Flu A&B, Covid) Anterior Nasal Swab     Status: None   Collection Time: 07/02/22  5:07 PM   Specimen: Anterior Nasal Swab  Result Value Ref Range   SARS Coronavirus 2 by RT PCR NEGATIVE NEGATIVE    Comment: (NOTE) SARS-CoV-2 target nucleic acids are NOT DETECTED.  The SARS-CoV-2 RNA is generally detectable in upper respiratory specimens during the acute phase of infection. The lowest concentration of SARS-CoV-2 viral copies this assay can detect is 138 copies/mL. A negative result does not preclude SARS-Cov-2 infection and should not be used as the sole basis for treatment or other patient management decisions. A negative result may occur with  improper specimen collection/handling, submission of specimen other than nasopharyngeal swab, presence of viral mutation(s) within the areas targeted by this assay, and inadequate number of viral copies(<138 copies/mL). A negative result must be combined with clinical observations, patient history, and epidemiological information. The expected result is Negative.  Fact Sheet for Patients:  EntrepreneurPulse.com.au  Fact Sheet for Healthcare Providers:  IncredibleEmployment.be  This test is no t yet approved or cleared by the [place] FDA and  has been authorized for detection and/or diagnosis of SARS-CoV-2 by FDA under an Emergency Use Authorization (EUA). This EUA will remain  in effect (meaning this test can be used) for the duration of the COVID-19 declaration under Section 564(b)(1) of the Act, 21 U.S.C.section 360bbb-3(b)(1), unless the authorization is terminated  or revoked sooner.       Influenza A by PCR NEGATIVE NEGATIVE   Influenza B  by PCR NEGATIVE NEGATIVE    Comment: (NOTE) The Xpert Xpress SARS-CoV-2/FLU/RSV plus assay is intended as an aid in the diagnosis of influenza from Nasopharyngeal swab specimens and should not be used as a sole basis for treatment. Nasal washings and aspirates are unacceptable for Xpert Xpress SARS-CoV-2/FLU/RSV testing.  Fact Sheet for Patients: EntrepreneurPulse.com.au  Fact Sheet for Healthcare Providers: IncredibleEmployment.be  This test is not yet approved or cleared by the [place] FDA and has been authorized for detection and/or diagnosis of SARS-CoV-2 by FDA under an Emergency Use Authorization (EUA). This EUA will remain in effect (meaning this test can be used) for the duration of the COVID-19 declaration under Section 564(b)(1) of the Act, 21 U.S.C. section 360bbb-3(b)(1), unless the authorization is terminated or revoked.     Resp Syncytial Virus by PCR NEGATIVE NEGATIVE    Comment: (NOTE) Fact Sheet for Patients: EntrepreneurPulse.com.au  Fact Sheet for Healthcare Providers: IncredibleEmployment.be  This test is not yet approved or cleared by the [place] FDA and has been authorized for detection and/or diagnosis of SARS-CoV-2 by FDA under an Emergency Use Authorization (EUA). This EUA will remain in effect (meaning this test can be used) for the duration of the COVID-19 declaration under Section 564(b)(1) of the Act, 21 U.S.C. section 360bbb-3(b)(1), unless the authorization is terminated or revoked.  Performed at [facility], [place] [address], [place], [place] [address]   CBC     Status: Abnormal   Collection Time: 07/02/22  7:52 PM  Result Value Ref Range   WBC 5.1 4.0 - 10.5 K/uL   RBC 3.99 3.87 - 5.11 MIL/uL   Hemoglobin 11.9 (L) 12.0 - 15.0 g/dL    Comment: REPEATED TO VERIFY   HCT 36.3 36.0 - 46.0 %   MCV 91.0 80.0 - 100.0 fL    Comment: REPEATED TO  VERIFY DELTA CHECK NOTED    MCH 29.8 26.0 - 34.0 pg   MCHC 32.8 30.0 - 36.0 g/dL   RDW 14.4 11.5 - 15.5 %   Platelets 213  150 - 400 K/uL   nRBC 0.0 0.0 - 0.2 %    Comment: Performed at [facility], [place] [address] [place], [place] [address]  Comprehensive metabolic panel     Status: Abnormal   Collection Time: 07/02/22  9:45 PM  Result Value Ref Range   Sodium 138 135 - 145 mmol/L   Potassium 3.9 3.5 - 5.1 mmol/L   Chloride 110 98 - 111 mmol/L   CO2 19 (L) 22 - 32 mmol/L   Glucose, Bld 97 70 - 99 mg/dL    Comment: Glucose reference range applies only to samples taken after fasting for at least 8 hours.   BUN 19 8 - 23 mg/dL   Creatinine, Ser 1.20 (H) 0.44 - 1.00 mg/dL   Calcium 8.2 (L) 8.9 - 10.3 mg/dL   Total Protein 6.9 6.5 - 8.1 g/dL   Albumin 2.6 (L) 3.5 - 5.0 g/dL   AST QUANTITY NOT SUFFICIENT, UNABLE TO PERFORM TEST 15 - 41 U/L    Comment: SPOKE TO R.SMITH,RN    ALT QUANTITY NOT SUFFICIENT, UNABLE TO PERFORM TEST 0 - 44 U/L    Comment: SPOKE TO R.SMITH,RN    Alkaline Phosphatase 155 (H) 38 - 126 U/L   Total Bilirubin 0.5 0.3 - 1.2 mg/dL   GFR, Estimated 51 (L) >60 mL/min    Comment: (NOTE) Calculated using the CKD-EPI Creatinine Equation (2021)    Anion gap 9 5 - 15    Comment: Performed at [facility] [address], [place], [place] [address]  Troponin I (High Sensitivity)     Status: Abnormal   Collection Time: 07/02/22  9:45 PM  Result Value Ref Range   Troponin I (High Sensitivity) 19 (H) <18 ng/L    Comment: (NOTE) Elevated high sensitivity troponin I (hsTnI) values and significant  changes across serial measurements may suggest ACS but many other  chronic and acute conditions are known to elevate hsTnI results.  Refer to the "Links" section for chest pain algorithms and additional  guidance. Performed at [facility], [place] [address] [place], [place] [address]   CBC     Status: None   Collection Time: 07/02/22  9:45 PM  Result Value  Ref Range   WBC 5.3 4.0 - 10.5 K/uL   RBC 4.16 3.87 - 5.11 MIL/uL   Hemoglobin 12.3 12.0 - 15.0 g/dL   HCT 37.9 36.0 - 46.0 %   MCV 91.1 80.0 - 100.0 fL   MCH 29.6 26.0 - 34.0 pg   MCHC 32.5 30.0 - 36.0 g/dL   RDW 13.8 11.5 - 15.5 %   Platelets 193 150 - 400 K/uL   nRBC 0.0 0.0 - 0.2 %    Comment: Performed at [facility], [place] [address], [place], [place] [address]  Comprehensive metabolic panel     Status: Abnormal   Collection Time: 07/02/22 11:50 PM  Result Value Ref Range   Sodium 137 135 - 145 mmol/L   Potassium 3.7 3.5 - 5.1 mmol/L   Chloride 110 98 - 111 mmol/L   CO2 20 (L) 22 - 32 mmol/L   Glucose, Bld 90 70 - 99 mg/dL    Comment: Glucose reference range applies only to samples taken after fasting for at least 8 hours.   BUN 18 8 - 23 mg/dL   Creatinine, Ser 1.18 (H) 0.44 - 1.00 mg/dL   Calcium 8.2 (L) 8.9 - 10.3 mg/dL   Total Protein 6.8 6.5 -  8.1 g/dL   Albumin 2.6 (L) 3.5 - 5.0 g/dL   AST 439 (H) 15 - 41 U/L   ALT 447 (H) 0 - 44 U/L   Alkaline Phosphatase 159 (H) 38 - 126 U/L   Total Bilirubin 0.5 0.3 - 1.2 mg/dL   GFR, Estimated 52 (L) >60 mL/min    Comment: (NOTE) Calculated using the CKD-EPI Creatinine Equation (2021)    Anion gap 7 5 - 15    Comment: Performed at [place] [address] [place], [place] [address]  Troponin I (High Sensitivity)     Status: Abnormal   Collection Time: 07/02/22 11:50 PM  Result Value Ref Range   Troponin I (High Sensitivity) 22 (H) <18 ng/L    Comment: (NOTE) Elevated high sensitivity troponin I (hsTnI) values and significant  changes across serial measurements may suggest ACS but many other  chronic and acute conditions are known to elevate hsTnI results.  Refer to the "Links" section for chest pain algorithms and additional  guidance. Performed at [facility], [place] [address], [place], [place] [address]   Hepatitis panel, acute     Status: None   Collection Time: 07/03/22  1:15 AM  Result Value  Ref Range   Hepatitis B Surface Ag NON REACTIVE NON REACTIVE   HCV Ab NON REACTIVE NON REACTIVE    Comment: (NOTE) Nonreactive HCV antibody screen is consistent with no HCV infections,  unless recent infection is suspected or other evidence exists to indicate HCV infection.     Hep A IgM NON REACTIVE NON REACTIVE   Hep B C IgM NON REACTIVE NON REACTIVE    Comment: Performed at [facility], [place] [address], [place], [place] [address]  C Difficile Quick Screen w PCR reflex     Status: None   Collection Time: 07/03/22  6:36 AM   Specimen: STOOL  Result Value Ref Range   C Diff antigen NEGATIVE NEGATIVE   C Diff toxin NEGATIVE NEGATIVE   C Diff interpretation No C. difficile detected.     Comment: Performed at [facility], [place] [address] [place], [place] [address]  Gastrointestinal Panel by PCR , Stool     Status: Abnormal   Collection Time: 07/03/22  6:36 AM   Specimen: STOOL  Result Value Ref Range   Campylobacter species NOT DETECTED NOT DETECTED   Plesimonas shigelloides NOT DETECTED NOT DETECTED   Salmonella species NOT DETECTED NOT DETECTED   Yersinia enterocolitica NOT DETECTED NOT DETECTED   Vibrio species NOT DETECTED NOT DETECTED   Vibrio cholerae NOT DETECTED NOT DETECTED   Enteroaggregative E coli (EAEC) NOT DETECTED NOT DETECTED   Enteropathogenic E coli (EPEC) NOT DETECTED NOT DETECTED   Enterotoxigenic E coli (ETEC) NOT DETECTED NOT DETECTED   Shiga like toxin producing E coli (STEC) NOT DETECTED NOT DETECTED   Shigella/Enteroinvasive E coli (EIEC) NOT DETECTED NOT DETECTED   Cryptosporidium NOT DETECTED NOT DETECTED   Cyclospora cayetanensis NOT DETECTED NOT DETECTED   Entamoeba histolytica NOT DETECTED NOT DETECTED   Giardia lamblia NOT DETECTED NOT DETECTED   Adenovirus F40/41 NOT DETECTED NOT DETECTED   Astrovirus NOT DETECTED NOT DETECTED   Norovirus GI/GII DETECTED (A) NOT DETECTED    Comment: RESULT CALLED TO, READ BACK BY AND VERIFIED  WITH: SHANON ROWLAND 1226 07/03/22 MU    Rotavirus A NOT DETECTED NOT DETECTED   Sapovirus (I, II, IV, and V) NOT DETECTED NOT DETECTED  Comment: Performed at [facility], [place] [place], [place] [address]  Urinalysis, Routine w reflex microscopic     Status: Abnormal   Collection Time: 07/03/22 12:42 PM  Result Value Ref Range   Color, Urine YELLOW YELLOW   APPearance CLEAR CLEAR   Specific Gravity, Urine >1.046 (H) 1.005 - 1.030   pH 5.0 5.0 - 8.0   Glucose, UA NEGATIVE NEGATIVE mg/dL   Hgb urine dipstick NEGATIVE NEGATIVE   Bilirubin Urine NEGATIVE NEGATIVE   Ketones, ur NEGATIVE NEGATIVE mg/dL   Protein, ur NEGATIVE NEGATIVE mg/dL   Nitrite NEGATIVE NEGATIVE   Leukocytes,Ua NEGATIVE NEGATIVE    Comment: Performed at [facility] [address] [place],  [address]   US Abdomen Limited RUQ (LIVER/GB)  Result Date: 07/03/2022 CLINICAL DATA:  Right upper quadrant abdominal pain EXAM: ULTRASOUND ABDOMEN LIMITED RIGHT UPPER QUADRANT COMPARISON:  CT abdomen and pelvis with contrast July 03, 2022 FINDINGS: Gallbladder: No gallstones or wall thickening visualized. No sonographic Murphy sign noted by sonographer. Common bile duct: Diameter: 5 mm Liver: No focal lesion identified. Hepatic parenchymal echogenicity is within normal limits. Portal vein is patent on color Doppler imaging with normal direction of blood flow towards the liver. Other: None. IMPRESSION: No etiology for right upper quadrant pain identified. Electronically Signed   By: Beryle Flock M.D.   On: 07/03/2022 13:15   CT ABDOMEN PELVIS W CONTRAST  Result Date: 07/03/2022 CLINICAL DATA:  Acute abdominal pain, initial encounter EXAM: CT ABDOMEN AND PELVIS WITH CONTRAST TECHNIQUE: Multidetector CT imaging of the abdomen and pelvis was performed using the standard protocol following bolus administration of intravenous contrast. RADIATION DOSE REDUCTION: This exam was performed according to  the departmental dose-optimization program which includes automated exposure control, adjustment of the mA and/or kV according to patient size and/or use of iterative reconstruction technique. CONTRAST:  69mL OMNIPAQUE IOHEXOL 350 MG/ML SOLN COMPARISON:  01/01/2010 FINDINGS: Lower chest: No acute abnormality. Hepatobiliary: No focal liver abnormality is seen. No gallstones, gallbladder wall thickening, or biliary dilatation. Pancreas: Unremarkable. No pancreatic ductal dilatation or surrounding inflammatory changes. Spleen: Normal in size without focal abnormality. Adrenals/Urinary Tract: Right adrenal gland is within normal limits. Left adrenal gland demonstrates a 2.3 cm mass lesion with some central fatty components most consistent with an adenoma or myelolipoma. This was not well appreciated on the prior exam. Kidneys demonstrate a normal enhancement pattern bilaterally. No renal calculi or obstructive changes are seen. Delayed images demonstrates some patchy enhancement within the kidneys which may represent pyelonephritis. No obstructive changes are seen. Bladder is partially distended. Stomach/Bowel: Fluid is noted throughout the colon consistent with a mild diarrheal state. This corresponds to the patient's given clinical history. The appendix is not well visualized and may have been surgically removed. No inflammatory changes seen. Fluid is noted throughout the small bowel without definitive obstructive change. Stomach is decompressed. Vascular/Lymphatic: Aortic atherosclerosis. No enlarged abdominal or pelvic lymph nodes. Reproductive: Uterus and bilateral adnexa are unremarkable. Other: No abdominal wall hernia or abnormality. No abdominopelvic ascites. Musculoskeletal: No acute or significant osseous findings. IMPRESSION: No renal calculi or obstructive patchy enhancement on delayed images suspicious for pyelonephritis. Fluid noted throughout the colon and small bowel consistent with the diarrheal state.  Left adrenal mass measuring up to 2.3 cm with central fatty components. In retrospect the prior examination showed this lesion measuring approximately 1.9 cm. The fatty components suggest underlying myelolipoma. No further follow-up is recommended. Electronically Signed   By: Elta Guadeloupe  Lukens M.D.   On: 07/03/2022 02:45   DG Chest Portable 1 View  Result Date: 07/02/2022 CLINICAL DATA:  Chest pain. EXAM: PORTABLE CHEST 1 VIEW COMPARISON:  06/29/2022 FINDINGS: The cardiac silhouette, mediastinal and hilar contours are within normal limits. Stable calcification of the thoracic aorta. The lungs are clear of an acute process. No infiltrates, edema or effusions. No pulmonary lesions. Bilateral nipple shadows again noted. Age advanced bilateral carotid artery calcifications are noted. IMPRESSION: 1. No acute cardiopulmonary findings. 2. Age advanced bilateral carotid artery calcifications. Electronically Signed   By: Marijo Sanes M.D.   On: 07/02/2022 17:48    Pending Labs Unresulted Labs (From admission, onward)     Start     Ordered   07/03/22 1206  Comprehensive metabolic panel  Once,   R        07/03/22 1205   07/03/22 1057  Acetaminophen level  Once,   STAT        07/03/22 1056   07/03/22 1009  Urine Culture  Once,   URGENT       Question:  Indication  Answer:  Suprapubic pain   07/03/22 1009   07/03/22 0959  Ethanol  Once,   URGENT        07/03/22 1002   07/03/22 0959  Protime-INR  Once,   STAT        07/03/22 1002   07/03/22 0959  Ammonia  Once,   STAT        07/03/22 1002            Vitals/Pain Today's Vitals   07/03/22 0915 07/03/22 0929 07/03/22 1000 07/03/22 1045  BP: (!) 121/58  (!) 146/71 (!) 142/61  Pulse: 63     Resp: 16  14 20   Temp:      TempSrc:      SpO2: 100%     PainSc:  0-No pain      Isolation Precautions Enteric precautions (UV disinfection)  Medications Medications  acetaminophen (TYLENOL) tablet 650 mg (650 mg Oral Given 07/03/22 1229)  zolpidem  (AMBIEN) tablet 5 mg (has no administration in time range)  nicotine (NICODERM CQ - dosed in mg/24 hours) patch 21 mg (21 mg Transdermal Patient Refused/Not Given 07/03/22 1109)  atorvastatin (LIPITOR) tablet 80 mg (80 mg Oral Given 07/03/22 1107)  ticagrelor (BRILINTA) tablet 90 mg (90 mg Oral Given 07/03/22 1107)  sertraline (ZOLOFT) tablet 25 mg (25 mg Oral Given 07/03/22 1107)  isosorbide mononitrate (IMDUR) 24 hr tablet 15 mg (15 mg Oral Given 07/03/22 1107)  hydrALAZINE (APRESOLINE) tablet 25 mg (25 mg Oral Given 07/03/22 0537)  norepinephrine (LEVOPHED) 4-5 MG/250ML-% infusion SOLN (  Not Given 07/03/22 0741)  enoxaparin (LOVENOX) injection 30 mg (has no administration in time range)  cefTRIAXone (ROCEPHIN) 1 g in sodium chloride 0.9 % 100 mL IVPB (has no administration in time range)  iohexol (OMNIPAQUE) 350 MG/ML injection 75 mL (75 mLs Intravenous Contrast Given 07/03/22 0205)  loperamide (IMODIUM) capsule 2 mg (2 mg Oral Given 07/03/22 0537)    Mobility walks with person assist     Focused Assessments Cardiac Assessment Handoff:  Cardiac Rhythm: Normal sinus rhythm Lab Results  Component Value Date   CKTOTAL 76 04/19/2022   Lab Results  Component Value Date   DDIMER  07/13/2007    <0.22        AT THE INHOUSE ESTABLISHED CUTOFF VALUE OF 0.48 ug/mL FEU, THIS ASSAY HAS BEEN DOCUMENTED IN THE LITERATURE TO HAVE   Does  the Patient currently have chest pain? No   , Pulmonary Assessment Handoff:  Lung sounds:   O2 Device: Room Air      R Recommendations: See Admitting Provider Note  Report given to:   Additional Notes:

## 2022-07-03 NOTE — ED Provider Notes (Signed)
Update-blood pressure has improved and is now elevated, will start her home medications   Ripley Fraise, MD 07/03/22 [phone]

## 2022-07-03 NOTE — Consult Note (Addendum)
Consultation Note   Referring Provider:  ED PCP: Kerin Perna, NP Primary Gastroenterologist: Althia Forts        Reason for consultation: nausea and vomiting   Hospital Day: 2  Assessment    Patient profile:  Cassidy Larson is a 67 y.o. female with a past medical history significant for   . Polysubstance abuse with ongoing cocaine use, CKD, CAD status post STEMI/stent placement Oct 2023, and lupus.  See PMH for any additional medical problems.   # 67 yo female, very limited historian ,who we have been asked to see for N/V. She is an extremely unreliable historian. Gives conflicting answers due to being unable to remember things.  She denies vomiting but has had diarrhea for an unclear amount of time.  Stool path panel positive for norovirus.   # Abnormal liver chemistries in pattern of hepatocellular injury ( new). Normal liver on CT scan. Normal RUQ [place]. Acute viral hep negative. Norovirus has been associated with hepatitis in some patients. Also, consider ischemia given hypotension though transaminitis is generally more severe in those cases.  Related to illicit drug use? Numbers stable overnight.   # CKD. Renal function seems to be at baseline  # CAD / STEMI in October 2023. Elevated troponin.   # Polysubstance abuse. Ongoing cocaine and THC  # Homelessness  # See PMH for additional medical problems  Plan   Norovirus is self-limiting, no antibiotics needed. The question is whether she actually has acute on chronic diarrhea as there was mention of diarrhea during a November hospital visit. Monitor for now. If diarrhea persists beyond what is reasonable for gastroenteritis then will need further evaluation Supportive care Will monitor LFTs for now. Historically they have been normal  Check INR Avoid hepatotoxic medications.    HPI   Cassidy Larson was seen in ED 1/13 for chest pain. Discharged home. Returned on 1/14 for  diarrhea. Discharged home. Came back to ED yesterday by EMS after being found outside stumbling around with cyanosis of lips and fingers. She was hypotensive. Labs showed low albumin / low ca+. Liver chemistries markedly elevated ( they were normal in early November). Troponin elevated.   AST 357 >> 439 ALT 361>> 447 Alk phos 155 >> 159 Tbili normal.  Albumin 2.6  CBC normal Acute viral hep panel negative.  UDS + cocaine and THC  CT w contrast No renal calculi or obstructive patchy enhancement on delayed images suspicious for pyelonephritis. Fluid noted throughout the colon and small bowel consistent with the diarrheal state.Left adrenal mass measuring up to 2.3 cm with central fatty components. In retrospect the prior examination showed this lesion measuring approximately 1.9 cm. The fatty components suggest underlying myelolipoma. No further follow-up is recommended.  Cassidy Larson has nausea sometimes but denies vomiting. She has been having diarrhea for unclear amount of time. First tells me greater than three months but then says she didn't have diarrhea when she was cooking for herself up until about two weeks ago. She apparently has multiple loose BMs a day, cannot quantify but hasn't seen any blood in her stools. She hasn't recently started any new meds other then brillinta and she hasn't been taking her home meds for a few days it sounds  Recent Labs and Imaging CT ABDOMEN PELVIS W CONTRAST  Result Date: 07/03/2022 CLINICAL DATA:  Acute abdominal pain, initial encounter EXAM: CT ABDOMEN AND PELVIS WITH CONTRAST TECHNIQUE: Multidetector CT imaging of the abdomen and pelvis was performed using the standard protocol following bolus administration of intravenous contrast. RADIATION DOSE REDUCTION: This exam was performed according to the departmental dose-optimization program which includes automated exposure control, adjustment of the mA and/or kV according to patient size and/or use of  iterative reconstruction technique. CONTRAST:  19mL OMNIPAQUE IOHEXOL 350 MG/ML SOLN COMPARISON:  01/01/2010 FINDINGS: Lower chest: No acute abnormality. Hepatobiliary: No focal liver abnormality is seen. No gallstones, gallbladder wall thickening, or biliary dilatation. Pancreas: Unremarkable. No pancreatic ductal dilatation or surrounding inflammatory changes. Spleen: Normal in size without focal abnormality. Adrenals/Urinary Tract: Right adrenal gland is within normal limits. Left adrenal gland demonstrates a 2.3 cm mass lesion with some central fatty components most consistent with an adenoma or myelolipoma. This was not well appreciated on the prior exam. Kidneys demonstrate a normal enhancement pattern bilaterally. No renal calculi or obstructive changes are seen. Delayed images demonstrates some patchy enhancement within the kidneys which may represent pyelonephritis. No obstructive changes are seen. Bladder is partially distended. Stomach/Bowel: Fluid is noted throughout the colon consistent with a mild diarrheal state. This corresponds to the patient's given clinical history. The appendix is not well visualized and may have been surgically removed. No inflammatory changes seen. Fluid is noted throughout the small bowel without definitive obstructive change. Stomach is decompressed. Vascular/Lymphatic: Aortic atherosclerosis. No enlarged abdominal or pelvic lymph nodes. Reproductive: Uterus and bilateral adnexa are unremarkable. Other: No abdominal wall hernia or abnormality. No abdominopelvic ascites. Musculoskeletal: No acute or significant osseous findings. IMPRESSION: No renal calculi or obstructive patchy enhancement on delayed images suspicious for pyelonephritis. Fluid noted throughout the colon and small bowel consistent with the diarrheal state. Left adrenal mass measuring up to 2.3 cm with central fatty components. In retrospect the prior examination showed this lesion measuring approximately 1.9  cm. The fatty components suggest underlying myelolipoma. No further follow-up is recommended. Electronically Signed   By: Inez Catalina M.D.   On: 07/03/2022 02:45   DG Chest Portable 1 View  Result Date: 07/02/2022 CLINICAL DATA:  Chest pain. EXAM: PORTABLE CHEST 1 VIEW COMPARISON:  06/29/2022 FINDINGS: The cardiac silhouette, mediastinal and hilar contours are within normal limits. Stable calcification of the thoracic aorta. The lungs are clear of an acute process. No infiltrates, edema or effusions. No pulmonary lesions. Bilateral nipple shadows again noted. Age advanced bilateral carotid artery calcifications are noted. IMPRESSION: 1. No acute cardiopulmonary findings. 2. Age advanced bilateral carotid artery calcifications. Electronically Signed   By: Marijo Sanes M.D.   On: 07/02/2022 17:48   DG Chest 2 View  Result Date: 06/29/2022 CLINICAL DATA:  Chest pain for approximately 2 months. EXAM: CHEST - 2 VIEW COMPARISON:  None Available. FINDINGS: The heart size and mediastinal contours are within normal limits. Aortic atherosclerotic calcification incidentally noted. A thin linear radiodensity is seen on the frontal view overlying the superior mediastinum, which is not seen on the lateral view. This could represent an overlying object or foreign body. Both lungs are clear. No evidence of pneumothorax or pleural effusion. The visualized skeletal structures are unremarkable. IMPRESSION: Thin linear radiodensity overlying the superior mediastinum on the frontal view, which could represent an overlying object or foreign body. Clinical correlation is recommended. No active cardiopulmonary disease. Electronically Signed   By: Myles Rosenthal.D.  On: 06/29/2022 16:56    Labs:  Recent Labs    07/02/22 1649 07/02/22 1952 07/02/22 2145  WBC 3.4* 5.1 5.3  HGB 9.2* 11.9* 12.3  HCT 31.2* 36.3 37.9  PLT 141* 213 193   Recent Labs    07/02/22 1649 07/02/22 2145 07/02/22 2350  NA 137 138 137  K 3.5  3.9 3.7  CL 115* 110 110  CO2 14* 19* 20*  GLUCOSE 78 97 90  BUN 19 19 18   CREATININE 1.03* 1.20* 1.18*  CALCIUM 5.9* 8.2* 8.2*   Recent Labs    07/02/22 2350  PROT 6.8  ALBUMIN 2.6*  AST 439*  ALT 447*  ALKPHOS 159*  BILITOT 0.5   Recent Labs    07/03/22 0115  HEPBSAG NON REACTIVE  HCVAB NON REACTIVE  HEPAIGM NON REACTIVE  HEPBIGM NON REACTIVE   No results for input(s): "LABPROT", "INR" in the last 72 hours.  Past Medical History:  Diagnosis Date   Anxiety    CAD in native artery residual post LAD stent, 100% RCA and 80% LCx.  03/30/2022   CKD (chronic kidney disease) stage 4, GFR 15-29 ml/min (HCC)    Cocaine abuse ([place])    Essential hypertension    HLD (hyperlipidemia) 03/30/2022   Lupus ([place])    On mechanically assisted ventilation ([place]) extubated 03/28/22  03/28/2022   Polysubstance abuse ([place])    Positive ANA (antinuclear antibody)    S/P angioplasty with stent to mLAD 03/27/22 03/30/2022    Past Surgical History:  Procedure Laterality Date   CORONARY/GRAFT ACUTE MI REVASCULARIZATION N/A 03/27/2022   Procedure: Coronary/Graft Acute MI Revascularization;  Surgeon: Burnell Blanks, MD;  Location: [facility];  Service: Cardiovascular;  Laterality: N/A;   LEFT HEART CATH AND CORONARY ANGIOGRAPHY N/A 03/27/2022   Procedure: LEFT HEART CATH AND CORONARY ANGIOGRAPHY;  Surgeon: Burnell Blanks, MD;  Location: [facility];  Service: Cardiovascular;  Laterality: N/A;    Family History  Family history unknown: Yes    Prior to Admission medications   Medication Sig Start Date End Date Taking? Authorizing Provider  nitroGLYCERIN (NITROSTAT) 0.4 MG SL tablet Place 1 tablet (0.4 mg total) under the tongue every 5 (five) minutes as needed for chest pain. 04/01/22 04/01/23 Yes Burnell Blanks, MD  atorvastatin (LIPITOR) 80 MG tablet Take 1 tablet (80 mg total) by mouth daily. Patient not taking: Reported on 07/03/2022 04/01/22    Burnell Blanks, MD  hydrALAZINE (APRESOLINE) 25 MG tablet Take 1 tablet (25 mg total) by mouth every 8 (eight) hours. Patient not taking: Reported on 07/03/2022 04/01/22   Burnell Blanks, MD  isosorbide mononitrate (IMDUR) 30 MG 24 hr tablet Take 0.5 tablets (15 mg total) by mouth daily. Patient not taking: Reported on 07/03/2022 04/01/22   Burnell Blanks, MD  sertraline (ZOLOFT) 25 MG tablet Take 1 tablet (25 mg total) by mouth daily. 04/22/22 06/21/22  Elodia Florence., MD  ticagrelor (BRILINTA) 90 MG TABS tablet Take 1 tablet (90 mg total) by mouth 2 (two) times daily. Patient not taking: Reported on 07/03/2022 04/01/22   Burnell Blanks, MD    Current Facility-Administered Medications  Medication Dose Route Frequency Provider Last Rate Last Admin   acetaminophen (TYLENOL) tablet 650 mg  650 mg Oral Q4H PRN Ripley Fraise, MD   650 mg at 07/03/22 1229   atorvastatin (LIPITOR) tablet 80 mg  80 mg Oral Daily Ripley Fraise, MD   80 mg at 07/03/22  1107   enoxaparin (LOVENOX) injection 30 mg  30 mg Subcutaneous Q24H Katsadouros, Vasilios, MD       hydrALAZINE (APRESOLINE) tablet 25 mg  25 mg Oral Q8H Ripley Fraise, MD   25 mg at 07/03/22 0537   isosorbide mononitrate (IMDUR) 24 hr tablet 15 mg  15 mg Oral Daily Ripley Fraise, MD   15 mg at 07/03/22 1107   nicotine (NICODERM CQ - dosed in mg/24 hours) patch 21 mg  21 mg Transdermal Daily Ripley Fraise, MD       norepinephrine (LEVOPHED) 4-5 MG/250ML-% infusion SOLN            sertraline (ZOLOFT) tablet 25 mg  25 mg Oral Daily Ripley Fraise, MD   25 mg at 07/03/22 1107   ticagrelor (BRILINTA) tablet 90 mg  90 mg Oral BID Ripley Fraise, MD   90 mg at 07/03/22 1107   zolpidem (AMBIEN) tablet 5 mg  5 mg Oral QHS PRN Ripley Fraise, MD       Current Outpatient Medications  Medication Sig Dispense Refill   nitroGLYCERIN (NITROSTAT) 0.4 MG SL tablet Place 1 tablet (0.4 mg total) under the tongue  every 5 (five) minutes as needed for chest pain. 25 tablet 3   atorvastatin (LIPITOR) 80 MG tablet Take 1 tablet (80 mg total) by mouth daily. (Patient not taking: Reported on 07/03/2022) 90 tablet 3   hydrALAZINE (APRESOLINE) 25 MG tablet Take 1 tablet (25 mg total) by mouth every 8 (eight) hours. (Patient not taking: Reported on 07/03/2022) 90 tablet 11   isosorbide mononitrate (IMDUR) 30 MG 24 hr tablet Take 0.5 tablets (15 mg total) by mouth daily. (Patient not taking: Reported on 07/03/2022) 15 tablet 11   sertraline (ZOLOFT) 25 MG tablet Take 1 tablet (25 mg total) by mouth daily. 30 tablet 1   ticagrelor (BRILINTA) 90 MG TABS tablet Take 1 tablet (90 mg total) by mouth 2 (two) times daily. (Patient not taking: Reported on 07/03/2022) 60 tablet 11    Allergies as of 07/02/2022 - Reviewed 07/02/2022  Allergen Reaction Noted   Asa [aspirin] Diarrhea, Nausea And Vomiting, and Other (See Comments) 10/15/2012   Latex Itching 03/29/2013    Social History   Socioeconomic History   Marital status: Single    Spouse name: Not on file   Number of children: Not on file   Years of education: Not on file   Highest education level: Not on file  Occupational History   Not on file  Tobacco Use   Smoking status: Every Day    Packs/day: 0.50    Types: Cigarettes   Smokeless tobacco: Never  Substance and Sexual Activity   Alcohol use: No   Drug use: Yes    Types: Marijuana, "Crack" cocaine   Sexual activity: Yes    Birth control/protection: None  Other Topics Concern   Not on file  Social History Narrative   Not on file   Social Determinants of Health   Financial Resource Strain: Not on file  Food Insecurity: Food Insecurity Present (04/22/2022)   Hunger Vital Sign    Worried About Running Out of Food in the Last Year: Often true    Ran Out of Food in the Last Year: Often true  Transportation Needs: No Transportation Needs (04/20/2022)   PRAPARE - Hydrologist  (Medical): No    Lack of Transportation (Non-Medical): No  Recent Concern: Transportation Needs - Unmet Transportation Needs (04/01/2022)   PRAPARE -  Hydrologist (Medical): Yes    Lack of Transportation (Non-Medical): Yes  Physical Activity: Not on file  Stress: Not on file  Social Connections: Not on file  Intimate Partner Violence: Not At Risk (04/20/2022)   Humiliation, Afraid, Rape, and Kick questionnaire    Fear of Current or Ex-Partner: No    Emotionally Abused: No    Physically Abused: No    Sexually Abused: No    Review of Systems: All systems reviewed and negative except where noted in HPI.  Physical Exam: Vital signs in last 24 hours: Temp:  [97.2 F (36.2 C)-97.9 F (36.6 C)] 97.9 F (36.6 C) (01/16 2316) Pulse Rate:  [57-99] 63 (01/17 0915) Resp:  [14-35] 20 (01/17 1045) BP: (107-157)/(56-80) 142/61 (01/17 1045) SpO2:  [99 %-100 %] 100 % (01/17 0915)    General:  Alert female in NAD Psych:  Pleasant, cooperative.  Eyes: Pupils equal Ears:  Normal auditory acuity Nose: No deformity, discharge or lesions Neck:  Supple, no masses felt Lungs:  Clear to auscultation.  Heart:  Regular rate, regular rhythm.  Abdomen:  Soft, nondistended, nontender, active bowel sounds, no masses felt Rectal :  Deferred Msk: Symmetrical without gross deformities.  Neurologic:  Alert, oriented, grossly normal neurologically Extremities : No edema Skin:  Intact without significant lesions.    Intake/Output from previous day: No intake/output data recorded. Intake/Output this shift:  No intake/output data recorded.    Principal Problem:   Pyelonephritis    Tye Savoy, NP-C @  07/03/2022, 12:51 PM     I

## 2022-07-03 NOTE — ED Notes (Signed)
Patient cleaned of urine and stool and peri care performed.

## 2022-07-03 NOTE — H&P (Addendum)
Date: 07/03/2022               Patient Name:  Cassidy Larson MRN: [id number]  DOB: 1956/01/30 Age / Sex: 67 y.o., female   PCP: Kerin Perna, NP         Medical Service: Internal Medicine Teaching Service         Attending Physician: Dr. Aldine Contes, MD      First Contact: Dr. Nani Gasser, MD Pager [phone]    Second Contact: Dr. Buddy Duty, DO Pager [phone]         After Hours (After 5p/  First Contact Pager: [phone]  weekends / holidays): Second Contact Pager: [phone]   SUBJECTIVE   Chief Complaint: Dizzy, lightheaded, febrile, body aches  History of Present Illness:   Cassidy Larson is a 67 y/o F living with a history of STEMI 03/2022 s/p PTCA/DES to the LAD, polysubstance use, systemic lupus erythematosus, unhomed, HTN, HLD, CKD 3b who presents after she had chest pain yesterday evening. She was outside of [facility] when she began having central chest pain similar to pain she had prior to her MI. She took 2 nitroglycerins and began feeling lightheaded and dizzy. A bystander called EMS and she was brought to the ED.   The chest pain is reproducible to palpation and does not radiate. She states she has pain everywhere, worse in her lower back and abdomen. Endorses cough for the past day after being outside of [facility] yesterday evening. Denies fevers, shortness of breath, vomiting. Has had some nausea and diarrhea for the past few weeks. She believes the diarrhea started after her MI, but later mentions that it has improved over the last day. She notes an allergy to aspirin but states adherence to her therapies since hospital discharge.  Since discharge she has struggled with access to housing and has no support system int the area.   Meds:  Atorvastatin 80 mg daily Brilinta 80 mg BID Hydralazine 25 mg TID Asprin 81 mg daily Sertraline 25 mg daily Imdur 30 mg daily Ntroglyceirn PRN  Past Medical History CAD s/p PCI to LAD HTN HLD Lupus  CKD 3b  Past Surgical  History:  Procedure Laterality Date   CORONARY/GRAFT ACUTE MI REVASCULARIZATION N/A 03/27/2022   Procedure: Coronary/Graft Acute MI Revascularization;  Surgeon: Burnell Blanks, MD;  Location[facility];  Service: Cardiovascular;  Laterality: N/A;   LEFT HEART CATH AND CORONARY ANGIOGRAPHY N/A 03/27/2022   Procedure: LEFT HEART CATH AND CORONARY ANGIOGRAPHY;  Surgeon: Burnell Blanks, MD;  Location: [facility];  Service: Cardiovascular;  Laterality: N/A;   Social:  Lives  With: Out of her apartment since [place] Occupation: Not employed Support: No support in the area Level of Function: Needed cane and walker, but believe in old apartment Substances: 52 pack year history says she quit yesterday. Denies alcohol use. Hx of marijuana, smoking cocaine stopped 1 month. Would smoke cocaine.   Family History:  Mother - HTN, DM, anxiety, CVA, MI, stomach cancer Unsure of fathers medical history and she is unsure if she has siblings  Allergies: Allergies as of 07/02/2022 - Reviewed 07/02/2022  Allergen Reaction Noted   Asa [aspirin] Diarrhea, Nausea And Vomiting, and Other (See Comments) 10/15/2012   Latex Itching 03/29/2013   Review of Systems: A complete ROS was negative except as per HPI.   OBJECTIVE:   Physical Exam: Blood pressure (!) 142/61, pulse 63, temperature 97.9 F (36.6 C), temperature source Oral, resp. rate  20, SpO2 100 %.  Constitutional: chronically ill appearing, no acute distress HENT: normocephalic atraumatic, mucous membranes dry. Temporal wasting Eyes: conjunctiva non-erythematous. Neck: supple Cardiovascular: regular rate and rhythm, no m/r/g Pulmonary/Chest: normal work of breathing on room air, lungs clear to auscultation bilaterally Abdominal: soft, non-distended. Tender to palpate RUQ.  MSK: normal bulk and tone. Bilateral CVA tenderness, worse on left flank. Reproducible chest pain Neurological: alert & oriented x 3 Skin:  warm and dry. Flat raised rash across nasal bridge Psych: pleasant  Labs: CBC    Component Value Date/Time   WBC 5.3 07/02/2022 2145   RBC 4.16 07/02/2022 2145   HGB 12.3 07/02/2022 2145   HCT 37.9 07/02/2022 2145   PLT 193 07/02/2022 2145   MCV 91.1 07/02/2022 2145   MCH 29.6 07/02/2022 2145   MCHC 32.5 07/02/2022 2145   RDW 13.8 07/02/2022 2145   LYMPHSABS 0.6 (L) 07/02/2022 1649   MONOABS 0.4 07/02/2022 1649   EOSABS 0.1 07/02/2022 1649   BASOSABS 0.0 07/02/2022 1649     CMP     Component Value Date/Time   NA 137 07/02/2022 2350   K 3.7 07/02/2022 2350   CL 110 07/02/2022 2350   CO2 20 (L) 07/02/2022 2350   GLUCOSE 90 07/02/2022 2350   BUN 18 07/02/2022 2350   CREATININE 1.18 (H) 07/02/2022 2350   CALCIUM 8.2 (L) 07/02/2022 2350   PROT 6.8 07/02/2022 2350   ALBUMIN 2.6 (L) 07/02/2022 2350   AST 439 (H) 07/02/2022 2350   ALT 447 (H) 07/02/2022 2350   ALKPHOS 159 (H) 07/02/2022 2350   BILITOT 0.5 07/02/2022 2350   GFRNONAA 52 (L) 07/02/2022 2350   GFRAA 44 (L) 08/09/2018 1109    Imaging:  Chest xray - No focal opacities or blunting of costophrenic angles. No acute process  CT abdomen and pelvis with contrast -  No renal calculi or obstructive patchy enhancement on delayed images suspicious for pyelonephritis Fluid noted throughout the colon and small bowel consistent with the diarrheal state. Left adrenal mass measuring up to 2.3 cm with central fatty components. In retrospect the prior examination showed this lesion measuring approximately 1.9 cm. The fatty components suggest underlying myelolipoma. No further follow-up is recommended.   RUQ [place] - No gallbladder distension or inflammation. Common bile duct normal size.   EKG: personally reviewed my interpretation is normal sinus rhythm rate of 60 bpm. Normal axis, shortened PR interval. No st wave abnormalities. Prior EKG 01/15 without significant change  ASSESSMENT & PLAN:   Assessment & Plan by  Problem: Principal Problem:   Pyelonephritis  Cassidy Larson is a 67 y/o F living with a history of STEMI 03/2022 s/p PTCA/DES to the LAD, polysubstance use, systemic lupus erythematosus, unhomed, HTN, HLD, CKD 3b who presents after she had chest pain yesterday evening and found to have pyelonephritis and abnormal transaminase levels  Pyelonephritis Bilateral back pain for past day with nausea. CT imaging concerning for pyelonephritis. UA negative for WBC's. Hgb stable and afebrile. With symptoms of bilateral CVA tenderness and CT findings will treat with abx -ceftriaxone 1 g day 1/5, can extend course if continues to be symptomatic -hypotension has resolved, do not suspect septic shock  Transaminitis Elevated alk phos Endorses right upper quadrant abdominal pain. Positive murphy's sign on exam however negative on RUQ [place].CT imaging as well as RUQ [place] negative for hepatic or gallbladder abnormalities. Pending repeat CMP, but likely shock liver in setting of hypotension. Synthetic function intact, normal INR. Can consider autoimmune  hepatitis w/ history of lupus. Hepatitis panel negative. Denies alcohol use and states quit cocaine use 1 month ago. No history of injecting drugs.  -appreciate GI assistance -repeat CMP tomorrow, ast and alt down trending -ck normal -viral hepatitis panel negative -HSV pending  Hypotension resolved Presented with hypotension after taking multiple nitroglycerins this resolved in the ED. Appears her home antihypertensive regimen was restarted in the ED. Will hold these medications. Low suspicion for septic shock. Lactic acid not drawn on admission and will hold off now with normal anion gap and moralized blood pressures -hold antihypertensives and anti-anginals   Diarrhea GI panel + Norovirus Endorses diarrhea since her STEMI in October. She believes this is related to medications. Cardiology documentation note that does not appear to be true allergy to aspirin. Today  patient notes diarrheal episodes have improved. GI panel positive for norovirus, will manage conservatively and hopeful she has continued improvement.  -continue to monitor episodes of diarrhea  -monitor electrolytes -encourage PO intake  Hx of CAD STEMI 03/2022 s/p PTCA/DES to the LAD Recent STEMI with DES to LAD. Course complicated by agitation requiring ICU transfer and intubation. Prescribed brillinta, aspirin for one year as well as atorvastatin. Initially prescribed beta blocker but discontinued with history of cocaine use. Of note also had stenosis of circumflex and plan was ot observe her for adherence to medications and cocaine abstinence before placing further stents. She was discharged with coreg but this was discontinued during last hospitalization by hosptialist service with her history of cocaine use Questionable aspirin allergy however will continue and monitor GI side effects. Despite chest pain feeling similar to prior MI it is reproducibel on palpation and appears atypical. No EKG changes. Minimally elevated troponin likely secondary to her hypotension and demand ischemia. Do not believe chest pain is ACS -continue brilinta and aspirin. Monitor for GI side effects with aspirin. -atorvastatin 80 mg daily -will need to follow up with cardiology in regards to restarting beta blocker with cocaine use  CKD3b -GFR stable and at baseline -renally dose medications -urinary retention on CT, will check bladder scan  Hx of lupus Asymptotic and not on outpatient regimen   Left adrenal mass  Suspicious for underlying myelolipoma and no further imaging recommended. If persistent hypotension, can consider am cortisol.   Polysubstance use Endorses abstinence from cocaine since last month. Congratulated her on this.  -encourage to decrease substance use  Depression -continue zoloft  Unhomed Will need social work assistance, appears she is already tied in with [facility] and has been offered  shelters prior.  -TOC consult placed  Diet: Heart healthy/carb modified VTE: Enoxaparin IVF: None, Code: DNR/DNI  Prior to Admission Living Arrangement: Unhomed Anticipated Discharge Location:  Pending Barriers to Discharge: Further improvement in abdominal pain and treatmetn for her pyelonephritsi  Dispo: Admit patient to Inpatient with expected length of stay greater than 2 midnights.  Signed: Riesa Pope, MD Internal Medicine Resident PGY-3  07/03/2022, 12:15 PM

## 2022-07-03 NOTE — ED Provider Notes (Signed)
10:03 AM Care assumed from overnight team.  At time of transfer of care, patient was awaiting reassessment to determine disposition.  Patient's blood pressure has improved however she is still having severe abdominal pain and diarrhea.  Chart review shows that she already had a CT scan that did not show evidence of acute cholecystitis or gallstone troubles but her LFTs are persistent elevated and actually slightly rising.  The CT did show possible pyelonephritis however so we will add on a urinalysis and culture.  She reports has not drink any alcohol in her testing including a hepatitis panel was negative.  C. difficile testing was negative.  Viral testing negative for COVID, flu, RSV.  Her calcium improved in the emergency department.  Given her persistent abdominal pain, persistent LFT elevation, and continued persistent diarrhea, and possible pyelonephritis, will call for admission for further monitoring of her liver and making sure she does not go into acute liver failure.  Will send off ammonia, INR, and will call GI for the liver.  We will order the urinalysis and culture and if it is positive we will give antibiotics.  Will call for admission for further management.   Jru Pense, Gwenyth Allegra, MD 07/03/22 1530

## 2022-07-04 DIAGNOSIS — F1910 Other psychoactive substance abuse, uncomplicated: Secondary | ICD-10-CM | POA: Diagnosis not present

## 2022-07-04 DIAGNOSIS — R748 Abnormal levels of other serum enzymes: Secondary | ICD-10-CM | POA: Diagnosis not present

## 2022-07-04 DIAGNOSIS — A0811 Acute gastroenteropathy due to Norwalk agent: Secondary | ICD-10-CM

## 2022-07-04 LAB — URINE CULTURE

## 2022-07-04 LAB — CBC WITH DIFFERENTIAL/PLATELET
Abs Immature Granulocytes: 0.02 10*3/uL (ref 0.00–0.07)
Basophils Absolute: 0 10*3/uL (ref 0.0–0.1)
Basophils Relative: 0 %
Eosinophils Absolute: 0.2 10*3/uL (ref 0.0–0.5)
Eosinophils Relative: 3 %
HCT: 34.7 % — ABNORMAL LOW (ref 36.0–46.0)
Hemoglobin: 11.6 g/dL — ABNORMAL LOW (ref 12.0–15.0)
Immature Granulocytes: 0 %
Lymphocytes Relative: 9 %
Lymphs Abs: 0.4 10*3/uL — ABNORMAL LOW (ref 0.7–4.0)
MCH: 30 pg (ref 26.0–34.0)
MCHC: 33.4 g/dL (ref 30.0–36.0)
MCV: 89.7 fL (ref 80.0–100.0)
Monocytes Absolute: 0.4 10*3/uL (ref 0.1–1.0)
Monocytes Relative: 7 %
Neutro Abs: 4.1 10*3/uL (ref 1.7–7.7)
Neutrophils Relative %: 81 %
Platelets: 208 10*3/uL (ref 150–400)
RBC: 3.87 MIL/uL (ref 3.87–5.11)
RDW: 14.5 % (ref 11.5–15.5)
WBC: 5.1 10*3/uL (ref 4.0–10.5)
nRBC: 0 % (ref 0.0–0.2)

## 2022-07-04 LAB — COMPREHENSIVE METABOLIC PANEL WITH GFR
ALT: 278 U/L — ABNORMAL HIGH (ref 0–44)
AST: 220 U/L — ABNORMAL HIGH (ref 15–41)
Albumin: 2.5 g/dL — ABNORMAL LOW (ref 3.5–5.0)
Alkaline Phosphatase: 202 U/L — ABNORMAL HIGH (ref 38–126)
Anion gap: 8 (ref 5–15)
BUN: 16 mg/dL (ref 8–23)
CO2: 19 mmol/L — ABNORMAL LOW (ref 22–32)
Calcium: 8.3 mg/dL — ABNORMAL LOW (ref 8.9–10.3)
Chloride: 111 mmol/L (ref 98–111)
Creatinine, Ser: 1.43 mg/dL — ABNORMAL HIGH (ref 0.44–1.00)
GFR, Estimated: 41 mL/min — ABNORMAL LOW (ref >60)
Glucose, Bld: 87 mg/dL (ref 70–99)
Potassium: 3.7 mmol/L (ref 3.5–5.1)
Sodium: 138 mmol/L (ref 135–145)
Total Bilirubin: 0.7 mg/dL (ref 0.3–1.2)
Total Protein: 6.8 g/dL (ref 6.5–8.1)

## 2022-07-04 LAB — CORTISOL: Cortisol, Plasma: 19.3 ug/dL

## 2022-07-04 MED ORDER — HYDRALAZINE HCL 25 MG PO TABS
25.0000 mg | ORAL_TABLET | Freq: Three times a day (TID) | ORAL | Status: DC
  Administered 2022-07-04 – 2022-07-07 (×11): 25 mg via ORAL
  Filled 2022-07-04 (×13): qty 1

## 2022-07-04 NOTE — Progress Notes (Addendum)
Mobility Specialist Progress Note   07/04/22 1335  Mobility  Activity Ambulated with assistance in room;Dangled on edge of bed  Level of Assistance Standby assist, set-up cues, supervision of patient - no hands on  Assistive Device Front wheel walker  Distance Ambulated (ft) 30 ft  Range of Motion/Exercises Active;All extremities  Activity Response Tolerated well   Patient received in supine and pleasantly agreeable to participate. Ambulated short distance in room with supervision. Tolerated without complaint or incident. Was left in supine with all needs met, call bell in reach.   [place] Ajanay Farve, BS EXP Mobility Specialist Please contact via SecureChat or Rehab office at [phone]

## 2022-07-04 NOTE — Progress Notes (Signed)
Progress Note   Subjective  Patient feeling better. States she is tolerating some PO, less diarrhea. Labs improved.   Objective   Vital signs in last 24 hours: Temp:  [98 F (36.7 C)-98.8 F (37.1 C)] 98.3 F (36.8 C) (01/18 0909) Pulse Rate:  [60-67] 62 (01/18 0909) Resp:  [15-18] 16 (01/18 0909) BP: (91-152)/(47-83) 152/83 (01/18 0909) SpO2:  [96 %-99 %] 99 % (01/18 0909) Weight:  [41.1 kg] 41.1 kg (01/17 1639) Last BM Date : 07/03/22 General:    AA female in NAD Neurologic:  Alert and oriented,  grossly normal neurologically. Psych:  Cooperative. Normal mood and affect.  Intake/Output from previous day: No intake/output data recorded. Intake/Output this shift: No intake/output data recorded.  Lab Results: Recent Labs    07/02/22 1952 07/02/22 2145 07/04/22 0327  WBC 5.1 5.3 5.1  HGB 11.9* 12.3 11.6*  HCT 36.3 37.9 34.7*  PLT 213 193 208   BMET Recent Labs    07/02/22 2350 07/03/22 1406 07/04/22 0327  NA 137 136 138  K 3.7 3.9 3.7  CL 110 110 111  CO2 20* 18* 19*  GLUCOSE 90 92 87  BUN 18 15 16   CREATININE 1.18* 1.30* 1.43*  CALCIUM 8.2* 8.2* 8.3*   LFT Recent Labs    07/04/22 0327  PROT 6.8  ALBUMIN 2.5*  AST 220*  ALT 278*  ALKPHOS 202*  BILITOT 0.7   PT/INR Recent Labs    07/03/22 1406  LABPROT 15.3*  INR 1.2    Studies/Results: US Abdomen Limited RUQ (LIVER/GB)  Result Date: 07/03/2022 CLINICAL DATA:  Right upper quadrant abdominal pain EXAM: ULTRASOUND ABDOMEN LIMITED RIGHT UPPER QUADRANT COMPARISON:  CT abdomen and pelvis with contrast July 03, 2022 FINDINGS: Gallbladder: No gallstones or wall thickening visualized. No sonographic Murphy sign noted by sonographer. Common bile duct: Diameter: 5 mm Liver: No focal lesion identified. Hepatic parenchymal echogenicity is within normal limits. Portal vein is patent on color Doppler imaging with normal direction of blood flow towards the liver. Other: None. IMPRESSION: No  etiology for right upper quadrant pain identified. Electronically Signed   By: Beryle Flock M.D.   On: 07/03/2022 13:15   CT ABDOMEN PELVIS W CONTRAST  Result Date: 07/03/2022 CLINICAL DATA:  Acute abdominal pain, initial encounter EXAM: CT ABDOMEN AND PELVIS WITH CONTRAST TECHNIQUE: Multidetector CT imaging of the abdomen and pelvis was performed using the standard protocol following bolus administration of intravenous contrast. RADIATION DOSE REDUCTION: This exam was performed according to the departmental dose-optimization program which includes automated exposure control, adjustment of the mA and/or kV according to patient size and/or use of iterative reconstruction technique. CONTRAST:  76mL OMNIPAQUE IOHEXOL 350 MG/ML SOLN COMPARISON:  01/01/2010 FINDINGS: Lower chest: No acute abnormality. Hepatobiliary: No focal liver abnormality is seen. No gallstones, gallbladder wall thickening, or biliary dilatation. Pancreas: Unremarkable. No pancreatic ductal dilatation or surrounding inflammatory changes. Spleen: Normal in size without focal abnormality. Adrenals/Urinary Tract: Right adrenal gland is within normal limits. Left adrenal gland demonstrates a 2.3 cm mass lesion with some central fatty components most consistent with an adenoma or myelolipoma. This was not well appreciated on the prior exam. Kidneys demonstrate a normal enhancement pattern bilaterally. No renal calculi or obstructive changes are seen. Delayed images demonstrates some patchy enhancement within the kidneys which may represent pyelonephritis. No obstructive changes are seen. Bladder is partially distended. Stomach/Bowel: Fluid is noted throughout the colon consistent with a mild diarrheal state. This corresponds to the patient's  given clinical history. The appendix is not well visualized and may have been surgically removed. No inflammatory changes seen. Fluid is noted throughout the small bowel without definitive obstructive change.  Stomach is decompressed. Vascular/Lymphatic: Aortic atherosclerosis. No enlarged abdominal or pelvic lymph nodes. Reproductive: Uterus and bilateral adnexa are unremarkable. Other: No abdominal wall hernia or abnormality. No abdominopelvic ascites. Musculoskeletal: No acute or significant osseous findings. IMPRESSION: No renal calculi or obstructive patchy enhancement on delayed images suspicious for pyelonephritis. Fluid noted throughout the colon and small bowel consistent with the diarrheal state. Left adrenal mass measuring up to 2.3 cm with central fatty components. In retrospect the prior examination showed this lesion measuring approximately 1.9 cm. The fatty components suggest underlying myelolipoma. No further follow-up is recommended. Electronically Signed   By: Inez Catalina M.D.   On: 07/03/2022 02:45   DG Chest Portable 1 View  Result Date: 07/02/2022 CLINICAL DATA:  Chest pain. EXAM: PORTABLE CHEST 1 VIEW COMPARISON:  06/29/2022 FINDINGS: The cardiac silhouette, mediastinal and hilar contours are within normal limits. Stable calcification of the thoracic aorta. The lungs are clear of an acute process. No infiltrates, edema or effusions. No pulmonary lesions. Bilateral nipple shadows again noted. Age advanced bilateral carotid artery calcifications are noted. IMPRESSION: 1. No acute cardiopulmonary findings. 2. Age advanced bilateral carotid artery calcifications. Electronically Signed   By: Marijo Sanes M.D.   On: 07/02/2022 17:48       Assessment / Plan:    67 y/o female admitted with the following:  Norovirus - nausea vomiting with diarrhea Elevated liver enzymes - history of hypertension, history of cocaine use  Patient looking better today and feeling better.  Tolerating some p.o.  Her diarrhea is slowing down, she still has some nausea but generally looking better.  Her liver enzymes are also coming down.  Initial workup with labs and ultrasound has not shown a clear abnormality.   CK level normal.  I continue suspect she likely had some shock liver from hypotension/ischemia versus component of this from cocaine use.  Generally looking better with supportive measures.  Continue to trend liver enzymes, await HSV although I think that is less likely.  No other further workup at this time as long as her enzymes continue to improve with supportive care. She needs to abstain from cocaine and other illicit substances.  Will sign off for now, please call with additional questions.  Thanks  Jolly Mango, MD [facility]
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
## 2022-07-04 NOTE — Hospital Course (Addendum)
Principal Problem:   Elevated liver enzymes Active Problems:   Hypertension   Substance abuse (HCC)   Norovirus   CKD (chronic kidney disease), stage III (HCC)   Protein-calorie malnutrition, severe   Cough   Nasal congestion  Resolved Problems:   Chest pain   Pyelonephritis   Adrenal mass (HCC)  Consults: gi  Procedures:***  Follow-up items:  - adrenal mass

## 2022-07-04 NOTE — Progress Notes (Addendum)
Interval history Reports ongoing abdominal pain and back pain.  Back pain is chronic.  Her nausea, vomiting, and diarrhea have improved somewhat.  Physical exam Blood pressure (!) 143/69, pulse (!) 58, temperature 98.3 F (36.8 C), temperature source Oral, resp. rate 16, height 5' 2.5" (1.588 m), weight 41.1 kg, SpO2 100 %.  Frail-appearing female in no apparent distress Heart rate and rhythm are regular Respirations are regular and unlabored, lungs are clear bilaterally Abdomen is diffusely tender but nondistended Skin is warm and dry No gross neurologic deficits Pleasant, mood and affect are concordant  Labs AST/ALT 220/278, decreasing Alk phos 202, increasing HCO3 19 BUN/creatinine 16/1.43 WBCs 5.1 Hemoglobin 11.6 Norovirus positive Hepatitis A, B, and C negative  Images and other studies Right upper quadrant ultrasound unremarkable CT abdomen and pelvis equivocal for signs of formation around the kidneys  Assessment and plan Hospital day 1  Tinea Nobile is a 67 y.o. who presented with several days of nausea, vomiting, and diarrhea found to have norovirus and elevated liver enzymes due to hypovolemia and hypotension.  Principal Problem:   Norovirus Active Problems:   Substance abuse (HCC)   Elevated liver enzymes  Norovirus gastroenteritis, improving Improving with supportive measures.  Tolerating regular diet. - Enteric precautions in place  Elevated liver enzymes, improving In setting of hypotension on admission.  Patient was using a lot of nitroglycerin in the hours leading up to hospitalization, and was hypovolemic due to nausea and vomiting.  Hepatitis A, B, and C negative.  Right upper quadrant ultrasound without gallbladder or biliary tree findings.  HSV testing ordered to rule out this infectious cause of hepatitis. - A.m. CMP  Chest pain, improving Initially the chief concern.  History of CAD.  Her symptoms are atypical for coronary  chest pain.  Troponins were stable.  EKG without times of ischemia.  Impression is for referred pain from her abdomen versus a musculoskeletal issue.  History of coronary artery disease - Continue aspirin and ticagrelor  Hypertension Patient is stabilized.  BP 536R systolic today.  Will restart home meds. - Restart hydralazine 25 mg p.o. every 8 hours  Left adrenal mass Small, but with interval growth since it was last imaged.  A.m. cortisol was approximately 19.  CKD 3B Stable.  There was some initial concern for pyelonephritis but studies do not support this and the imaging was frankly equivocal. - Discontinue antibiotics  Diet: Regular IVF: None VTE: Lovenox Code: DNR PT/OT recommendations: No PT follow-up  Discharge plan: Pending workup and treatment of norovirus gastroenteritis and elevated liver enzymes  Nani Gasser MD 07/04/2022, 5:10 PM  Pager: [phone] After 5pm on weekdays and 1pm on weekends: [phone]

## 2022-07-04 NOTE — Evaluation (Signed)
Physical Therapy Evaluation Patient Details Name: Cassidy Larson MRN: [id number] DOB: [birth year] Today's Date: 07/04/2022  History of Present Illness  67 y/o homeless female admitted 1/16 admitted for elevated liver enzymes and nausea vomiting, diarrhea.  Poor historian.  She tested positive for norovirus.  She has a transaminitis that is new, imaging does not show anything concerning in her liver, Doppler shows no thrombus. 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 Pt was able to ambulate well in room with RW. Will follow acutely.  Pt currently with functional limitations due to the deficits listed below (see PT Problem List). Pt will benefit from skilled PT to increase their independence and safety with mobility to allow discharge to the venue listed below.          Recommendations for follow up therapy are one component of a multi-disciplinary discharge planning process, led by the attending physician.  Recommendations may be updated based on patient status, additional functional criteria and insurance authorization.  Follow Up Recommendations No PT follow up      Assistance Recommended at Discharge None  Patient can return home with the following  Assist for transportation    Equipment Recommendations Rolling walker (2 wheels)  Recommendations for Other Services       Functional Status Assessment Patient has had a recent decline in their functional status and demonstrates the ability to make significant improvements in function in a reasonable and predictable amount of time.     Precautions / Restrictions Precautions Precautions: Fall Precaution Comments: Norovirus Restrictions Weight Bearing Restrictions: No      Mobility  Bed Mobility Overal bed mobility: Independent                   Transfers Overall transfer level: Independent                 General transfer comment: moves slowly    Ambulation/Gait Ambulation/Gait assistance: Supervision Gait Distance (Feet): 65 Feet Assistive device: Rolling walker (2 wheels) Gait Pattern/deviations: Step-through pattern, Decreased stride length   Gait velocity interpretation: <1.31 ft/sec, indicative of household ambulator   General Gait Details: Pt slow moving but steady with RW. No LOB.  Stairs            Wheelchair Mobility    Modified Rankin (Stroke Patients Only)       Balance Overall balance assessment: Independent                                           Pertinent Vitals/Pain Pain Assessment Pain Assessment: No/denies pain    Home Living Family/patient expects to be discharged to:: Shelter/Homeless                        Prior Function Prior Level of Function : Independent/Modified Independent             Mobility Comments: ambulates in community; occasionally uses RW per pt and previous chart stated she used cane; does report hx of falls but unable to state how often or why (syncope, fatigue, tripping, etc) ADLs Comments: Does not drive ; independent with adls and iadls     Hand Dominance   Dominant Hand: Left  Extremity/Trunk Assessment   Upper Extremity Assessment Upper Extremity Assessment: Defer to OT evaluation    Lower Extremity Assessment Lower Extremity Assessment: Generalized weakness    Cervical / Trunk Assessment Cervical / Trunk Assessment: Normal  Communication   Communication: No difficulties  Cognition Arousal/Alertness: Awake/alert Behavior During Therapy: WFL for tasks assessed/performed Overall Cognitive Status: Within Functional Limits for tasks assessed                                          General Comments General comments (skin integrity, edema, etc.): VSS    Exercises      Assessment/Plan    PT Assessment Patient needs continued PT services  PT Problem List Decreased activity tolerance;Decreased balance;Decreased mobility;Decreased knowledge of use of DME;Decreased safety awareness;Decreased knowledge of precautions       PT Treatment Interventions DME instruction;Gait training;Functional mobility training;Therapeutic activities;Therapeutic exercise;Balance training;Patient/family education    PT Goals (Current goals can be found in the Care Plan section)  Acute Rehab PT Goals Patient Stated Goal: to go home PT Goal Formulation: With patient Time For Goal Achievement: 07/18/22 Potential to Achieve Goals: Good    Frequency Min 3X/week     Co-evaluation               AM-PAC PT "6 Clicks" Mobility  Outcome Measure Help needed turning from your back to your side while in a flat bed without using bedrails?: None Help needed moving from lying on your back to sitting on the side of a flat bed without using bedrails?: None Help needed moving to and from a bed to a chair (including a wheelchair)?: None Help needed standing up from a chair using your arms (e.g., wheelchair or bedside chair)?: A Little Help needed to walk in hospital room?: A Little Help needed climbing 3-5 steps with a railing? : A Little 6 Click Score: 21    End of Session Equipment Utilized During Treatment: Gait belt Activity Tolerance: Patient tolerated treatment well Patient left: in bed;with call bell/phone within reach;with bed alarm set Nurse Communication: Mobility status PT Visit Diagnosis: Muscle weakness (generalized) (M62.81)    Time: 2409-7353 PT Time Calculation (min) (ACUTE ONLY): 23 min   Charges:   PT Evaluation $PT Eval Moderate Complexity: 1 Mod PT Treatments $Gait Training: 8-22 mins        [facility] M,PT Acute Rehab Services [phone]   Alvira Philips 07/04/2022, 1:49 PM

## 2022-07-05 DIAGNOSIS — R748 Abnormal levels of other serum enzymes: Secondary | ICD-10-CM

## 2022-07-05 DIAGNOSIS — E278 Other specified disorders of adrenal gland: Secondary | ICD-10-CM

## 2022-07-05 DIAGNOSIS — N183 Chronic kidney disease, stage 3 unspecified: Secondary | ICD-10-CM | POA: Insufficient documentation

## 2022-07-05 LAB — COMPREHENSIVE METABOLIC PANEL
ALT: 379 U/L — ABNORMAL HIGH (ref 0–44)
AST: 345 U/L — ABNORMAL HIGH (ref 15–41)
Albumin: 2.4 g/dL — ABNORMAL LOW (ref 3.5–5.0)
Alkaline Phosphatase: 332 U/L — ABNORMAL HIGH (ref 38–126)
Anion gap: 8 (ref 5–15)
BUN: 12 mg/dL (ref 8–23)
CO2: 18 mmol/L — ABNORMAL LOW (ref 22–32)
Calcium: 8.3 mg/dL — ABNORMAL LOW (ref 8.9–10.3)
Chloride: 109 mmol/L (ref 98–111)
Creatinine, Ser: 1.17 mg/dL — ABNORMAL HIGH (ref 0.44–1.00)
GFR, Estimated: 52 mL/min — ABNORMAL LOW (ref >60)
Glucose, Bld: 72 mg/dL (ref 70–99)
Potassium: 3.6 mmol/L (ref 3.5–5.1)
Sodium: 135 mmol/L (ref 135–145)
Total Bilirubin: 0.8 mg/dL (ref 0.3–1.2)
Total Protein: 7 g/dL (ref 6.5–8.1)

## 2022-07-05 MED ORDER — ADULT MULTIVITAMIN W/MINERALS CH
1.0000 | ORAL_TABLET | Freq: Every day | ORAL | Status: DC
  Administered 2022-07-05 – 2022-07-08 (×4): 1 via ORAL
  Filled 2022-07-05 (×4): qty 1

## 2022-07-05 MED ORDER — LACTATED RINGERS IV SOLN: INTRAVENOUS | Status: AC

## 2022-07-05 MED ORDER — ENSURE ENLIVE PO LIQD
237.0000 mL | Freq: Two times a day (BID) | ORAL | Status: DC
  Administered 2022-07-06 (×2): 237 mL via ORAL

## 2022-07-05 NOTE — Progress Notes (Signed)
Mobility Specialist Progress Note:   07/05/22 1014  Mobility  Activity Stood at bedside  Level of Assistance Standby assist, set-up cues, supervision of patient - no hands on  Assistive Device Front wheel walker  Activity Response Tolerated fair  Mobility Referral Yes  $Mobility charge 1 Mobility   Pt received in bed and agreeable. C/o soreness in BUE and dizziness upon sitting EOB, worsened with standing. Pt returned to bed with all needs met, call bell in reach, and bed alarm on.   Andrey Campanile Mobility Specialist Please contact via SecureChat or  Rehab office at [phone]

## 2022-07-05 NOTE — Progress Notes (Signed)
Interval history Her abd is feeling sore. Denies n/v, but notes that she is just feeling ill. Has a rash between her legs. Feels a little feverish and has night sweats. Has been losing weight. Denies any hx of liver problems. Denies any new medications outside of the hospital. Uses crack and weed.  Physical exam Blood pressure (!) 151/91, pulse (!) 57, temperature 98.4 F (36.9 C), temperature source Oral, resp. rate 16, height 5' 2.5" (1.588 m), weight 41.1 kg, SpO2 100 %.  Frail-appearing female in no apparent distress Heart rate and rhythm are regular Respirations are regular and unlabored, lungs are clear bilaterally Abdomen is diffusely tender but nondistended Skin is warm and dry No gross neurologic deficits Pleasant, mood and affect are concordant  Labs Creatinine 1.17 AST/ALT 345/379 Alk phos 332  Assessment and plan Hospital day 2  Cassidy Larson is a 67 y.o. admitted for norovirus gastroenteritis, found to have elevated liver enzymes for which workup is still ongoing.  Principal Problem:   Elevated liver enzymes Active Problems:   Hypertension   Substance abuse (HCC)   Norovirus   CKD (chronic kidney disease), stage III (HCC)  Elevated liver enzymes, worsening Initially thought to be due to hypotension on admission.  Was improving until today when enzymes increased across-the-board today.  Would not expect ischemic hepatitis to get better then worse again after resolution of the inciting hypotensive episode.  Her history is not revealing of any exposures outside of the hospital that could have caused this.  She was queried about drug use and sexual habits.  She uses crack cocaine and marijuana and has not been sexually active in some time.  Review of systems is positive for some constitutional symptoms like night sweats and weight loss.  Hepatitis viruses been ruled out, CK normal.  Her imaging was unremarkable this regard.  HSV test is still  pending.  There is an association with cocaine use and liver enzyme elevation but the differential remains broad.  Ceftriaxone can also increase liver enzymes, and 1 dose of the medication was administered out of concern for pyelonephritis. Will also discontinue any potentially offending medications. - Continue IV fluids - Discontinue atorvastatin - Discontinue acetaminophen  Norovirus gastroenteritis, improving Improving with supportive measures.  Tolerating regular diet. - Enteric precautions in place  History of CAD - Atorvastatin discontinued in setting of elevated liver enzymes  Hypertension BP 568L to 275T systolic today. - Continue hydralazine 25 mg every 8 hours  Left adrenal mass Stable.  A.m. cortisol normal.  CKD 3B Stable.  Diet: regular IVF: LR 100 cc/h x 10 hr VTE: Lovenox Code: DNR PT/OT recommendations: No PT follow up  Discharge plan: pending workup and treatment of elevated liver enzymes   Nani Gasser MD 07/05/2022, 4:51 PM  Pager: [phone] After 5pm on weekdays and 1pm on weekends: [phone]

## 2022-07-05 NOTE — Progress Notes (Signed)
Initial Nutrition Assessment  DOCUMENTATION CODES:   Severe malnutrition in context of social or environmental circumstances  INTERVENTION:  - Liberalize to Regular diet.   - Add Ensure Enlive po BID, each supplement provides 350 kcal and 20 grams of protein.  - Add MVI q day.   NUTRITION DIAGNOSIS:   Severe Malnutrition related to social / environmental circumstances as evidenced by severe fat depletion, severe muscle depletion.  GOAL:   Patient will meet greater than or equal to 90% of their needs  MONITOR:   PO intake, Supplement acceptance  REASON FOR ASSESSMENT:   Malnutrition Screening Tool    ASSESSMENT:   67 y.o. female admits related to dizziness, lightheadedness, febrile and body aches. PMH includes: CKD stage 3, HTN, Lupus, CAD. Pt is currently receiving medical management for Norovirus gastroenteritis.  Meds reviewed. Labs reviewed: AST/ALT, alk phos elevated.   The pt reports that she has been eating fair since admission. She states that she was not eating well for about a week PTA. Pt had some difficulty verbalizing hx bt made several mentions of having no place to live. Wt stable per record. The pt reports that she would like to try protein shakes. RD will liberalize diet to Regular and continue to encourage PO intakes.   NUTRITION - FOCUSED PHYSICAL EXAM:  Flowsheet Row Most Recent Value  Orbital Region Severe depletion  Upper Arm Region Severe depletion  Thoracic and Lumbar Region Unable to assess  Buccal Region Severe depletion  Temple Region Severe depletion  Clavicle Bone Region Severe depletion  Clavicle and Acromion Bone Region Severe depletion  Scapular Bone Region Moderate depletion  Dorsal Hand Moderate depletion  Patellar Region Moderate depletion  Anterior Thigh Region Moderate depletion  Posterior Calf Region Moderate depletion  Edema (RD Assessment) None  Hair Reviewed  Eyes Reviewed  Mouth Reviewed  Skin Reviewed  Nails Reviewed        Diet Order:   Diet Order             Diet heart healthy/carb modified Room service appropriate? Yes; Fluid consistency: Thin  Diet effective now                   EDUCATION NEEDS:   Not appropriate for education at this time  Skin:  Skin Assessment: Reviewed RN Assessment  Last BM:  07/03/22  Height:   Ht Readings from Last 1 Encounters:  07/03/22 5' 2.5" (1.588 m)    Weight:   Wt Readings from Last 1 Encounters:  07/03/22 41.1 kg    Ideal Body Weight:     BMI:  Body mass index is 16.31 kg/m.  Estimated Nutritional Needs:   Kcal:  1235-1440 kcals  Protein:  60-75 gm  Fluid:  >/= 1.2 L  Thalia Bloodgood, RD, LDN, CNSC.

## 2022-07-06 ENCOUNTER — Inpatient Hospital Stay (HOSPITAL_COMMUNITY): Payer: Medicaid Other

## 2022-07-06 DIAGNOSIS — R059 Cough, unspecified: Secondary | ICD-10-CM | POA: Insufficient documentation

## 2022-07-06 DIAGNOSIS — E43 Unspecified severe protein-calorie malnutrition: Secondary | ICD-10-CM | POA: Insufficient documentation

## 2022-07-06 DIAGNOSIS — R0981 Nasal congestion: Secondary | ICD-10-CM | POA: Insufficient documentation

## 2022-07-06 DIAGNOSIS — R748 Abnormal levels of other serum enzymes: Secondary | ICD-10-CM | POA: Diagnosis not present

## 2022-07-06 LAB — COMPREHENSIVE METABOLIC PANEL
ALT: 245 U/L — ABNORMAL HIGH (ref 0–44)
AST: 142 U/L — ABNORMAL HIGH (ref 15–41)
Albumin: 2.4 g/dL — ABNORMAL LOW (ref 3.5–5.0)
Alkaline Phosphatase: 274 U/L — ABNORMAL HIGH (ref 38–126)
Anion gap: 9 (ref 5–15)
BUN: 15 mg/dL (ref 8–23)
CO2: 21 mmol/L — ABNORMAL LOW (ref 22–32)
Calcium: 8.5 mg/dL — ABNORMAL LOW (ref 8.9–10.3)
Chloride: 105 mmol/L (ref 98–111)
Creatinine, Ser: 1.17 mg/dL — ABNORMAL HIGH (ref 0.44–1.00)
GFR, Estimated: 52 mL/min — ABNORMAL LOW (ref >60)
Glucose, Bld: 106 mg/dL — ABNORMAL HIGH (ref 70–99)
Potassium: 3.6 mmol/L (ref 3.5–5.1)
Sodium: 135 mmol/L (ref 135–145)
Total Bilirubin: 0.5 mg/dL (ref 0.3–1.2)
Total Protein: 6.9 g/dL (ref 6.5–8.1)

## 2022-07-06 LAB — RESP PANEL BY RT-PCR (RSV, FLU A&B, COVID)  RVPGX2
Influenza A by PCR: NEGATIVE
Influenza B by PCR: NEGATIVE
Resp Syncytial Virus by PCR: NEGATIVE
SARS Coronavirus 2 by RT PCR: NEGATIVE

## 2022-07-06 MED ORDER — LACTATED RINGERS IV SOLN: INTRAVENOUS | Status: AC

## 2022-07-06 MED ORDER — LOPERAMIDE HCL 2 MG PO CAPS: 4.0000 mg | ORAL_CAPSULE | ORAL | Status: DC | PRN

## 2022-07-06 MED ORDER — GUAIFENESIN-DM 100-10 MG/5ML PO SYRP
5.0000 mL | ORAL_SOLUTION | ORAL | Status: DC | PRN
  Administered 2022-07-06 (×3): 5 mL via ORAL
  Filled 2022-07-06 (×3): qty 10

## 2022-07-06 NOTE — Progress Notes (Addendum)
Interval history Feeling icky this morning. Started sneezing and sinuses are acting up. Having a cough too. Has chest pain during coughing spells.   No more episodes of diarrhea.  Diffuse pain, that is more chronic than acute.  Updated by nurse later on in the morning that the patient did not fact have some diarrhea.  Physical exam Blood pressure (!) 113/56, pulse (!) 59, temperature 98.5 F (36.9 C), temperature source Oral, resp. rate 18, height 5' 2.5" (1.588 m), weight 41.1 kg, SpO2 96 %.  Frail-appearing female in no apparent distress Heart rate and rhythm are regular Respirations are regular and unlabored, crackles right lung base Abdomen is diffusely tender but nondistended Skin is warm and dry No gross neurologic deficits Pleasant, mood and affect are concordant  Labs Creatinine 1.17 AST/ALT 142/245 Alk phos 274 COVID, flu a and B, RSV negative  Images and other studies CXR is unremarkable  Assessment and plan Hospital day 3  Cassidy Larson is a 67 y.o. admitted for norovirus gastroenteritis, found to have elevated liver enzymes for which workup is still ongoing.   Principal Problem:   Elevated liver enzymes Active Problems:   Hypertension   Substance abuse (HCC)   Norovirus   CKD (chronic kidney disease), stage III (HCC)   Protein-calorie malnutrition, severe   Cough   Nasal congestion  Elevated liver enzymes Liver enzymes improved today.  Overall clinical condition is stable.  Etiology still unknown.  Differential includes hypovolemia and ischemic liver injury, HSV hepatitis, cocaine induced liver injury or other drug/medication induced liver injury. - Continue IV fluids - A.m. CMP  Cough Nasal congestion Started yesterday.  Suspicious for viral URI/rhinosinusitis.  Exam is reassuring and viral panel is negative for COVID, flu, RSV.  CXR does not show any evidence of pneumonia. - Robitussin for cough - A.m. CBC  Nutrition  Status: Nutrition Problem: Severe Malnutrition Etiology: social / environmental circumstances Signs/Symptoms: severe fat depletion, severe muscle depletion Interventions: Ensure Enlive (each supplement provides 350kcal and 20 grams of protein), MVI  Norovirus gastroenteritis, improving Improving with supportive measures.  Tolerating regular diet. - Enteric precautions in place - Starting Imodium for symptomatic therapy   History of CAD - Atorvastatin discontinued in setting of elevated liver enzymes   Hypertension - Continue hydralazine 25 mg every 8 hours  Diet: regular IVF: LR 100 cc/h x 10 hr VTE: Lovenox Code: DNR PT/OT recommendations: No PT follow up   Discharge plan: pending workup and treatment of elevated liver enzymes   Nani Gasser MD 07/06/2022, 12:13 PM  Pager: [phone] After 5pm on weekdays and 1pm on weekends: [phone]

## 2022-07-06 NOTE — Progress Notes (Signed)
Mobility Specialist Progress Note:   07/06/22 1515  Mobility  Activity Ambulated with assistance in room  Level of Assistance Standby assist, set-up cues, supervision of patient - no hands on  Assistive Device Front wheel walker  Distance Ambulated (ft) 2 ft  Activity Response Tolerated well  Mobility Referral Yes  $Mobility charge 1 Mobility   Pt received in bed and agreeable. C/o fatigue throughout session. Pt returned to bed with all needs met and call bell in reach.   Andrey Campanile Mobility Specialist Please contact via SecureChat or  Rehab office at [phone]

## 2022-07-07 DIAGNOSIS — R748 Abnormal levels of other serum enzymes: Secondary | ICD-10-CM | POA: Diagnosis not present

## 2022-07-07 LAB — COMPREHENSIVE METABOLIC PANEL
ALT: 187 U/L — ABNORMAL HIGH (ref 0–44)
AST: 99 U/L — ABNORMAL HIGH (ref 15–41)
Albumin: 2.3 g/dL — ABNORMAL LOW (ref 3.5–5.0)
Alkaline Phosphatase: 223 U/L — ABNORMAL HIGH (ref 38–126)
Anion gap: 8 (ref 5–15)
BUN: 19 mg/dL (ref 8–23)
CO2: 21 mmol/L — ABNORMAL LOW (ref 22–32)
Calcium: 8.5 mg/dL — ABNORMAL LOW (ref 8.9–10.3)
Chloride: 105 mmol/L (ref 98–111)
Creatinine, Ser: 1.19 mg/dL — ABNORMAL HIGH (ref 0.44–1.00)
GFR, Estimated: 51 mL/min — ABNORMAL LOW (ref >60)
Glucose, Bld: 86 mg/dL (ref 70–99)
Potassium: 4.3 mmol/L (ref 3.5–5.1)
Sodium: 134 mmol/L — ABNORMAL LOW (ref 135–145)
Total Bilirubin: 0.3 mg/dL (ref 0.3–1.2)
Total Protein: 6.8 g/dL (ref 6.5–8.1)

## 2022-07-07 LAB — CBC
HCT: 37.2 % (ref 36.0–46.0)
Hemoglobin: 11.9 g/dL — ABNORMAL LOW (ref 12.0–15.0)
MCH: 29.1 pg (ref 26.0–34.0)
MCHC: 32 g/dL (ref 30.0–36.0)
MCV: 91 fL (ref 80.0–100.0)
Platelets: 190 10*3/uL (ref 150–400)
RBC: 4.09 MIL/uL (ref 3.87–5.11)
RDW: 14.2 % (ref 11.5–15.5)
WBC: 4.9 10*3/uL (ref 4.0–10.5)
nRBC: 0 % (ref 0.0–0.2)

## 2022-07-07 NOTE — Progress Notes (Signed)
Mobility Specialist Progress Note:   07/07/22 0945  Mobility  Activity Stood at bedside (~10 min x2)  Level of Assistance Standby assist, set-up cues, supervision of patient - no hands on  Assistive Device Front wheel walker  Activity Response Tolerated fair  Mobility Referral Yes  $Mobility charge 1 Mobility   Pt received in bed and requesting assistance with pericare. C/o stomach upset, did not specify. Required MinA for pericare after BM in bed and SB for pericare after 2x accidental urination on floor. SB for standing at EOB. Pt left in bed with all needs met, call bell in reach, and bed alarm on.   Andrey Campanile Mobility Specialist Please contact via SecureChat or  Rehab office at [phone]

## 2022-07-07 NOTE — Progress Notes (Signed)
Interval history Reports she continues to have loose stools, estimates about once daily. She attributes this to the lovenox injections. Also having chills. Feels cough is about the same as yesterday.   Physical exam Blood pressure (!) 140/74, pulse 64, temperature 98.9 F (37.2 C), temperature source Oral, resp. rate 18, height 5' 2.5" (1.588 m), weight 41.1 kg, SpO2 100 %.  Consitutional: Laying in bed, no acute distress, chronically ill appearing Respiratory: Regular rate and rhythm, no murmurs  Respiratory: No increased work of breathing. Mild crackle of the right lung base, no wheezing Abdomen: tenderness of the LUQ, normal BS, no distension or organomegaly Skin" warm and dry Neurologic: Aox4, no focal deficits   Labs Creatinine 1.19 AST/ALT 99/187 Alk phos 223 COVID, flu a and B, RSV negative  Images and other studies CXR is unremarkable  Assessment and plan Hospital day 4  Cassidy Larson is a 67 y.o. admitted for elevated liver enzymes likely due to shock in the setting of norovirus gastroenteritis.   Principal Problem:   Elevated liver enzymes Active Problems:   Hypertension   Substance abuse (HCC)   Norovirus   CKD (chronic kidney disease), stage III (HCC)   Protein-calorie malnutrition, severe   Cough   Nasal congestion  Elevated liver enzymes Overall continues to improve. Off IV fluids. HSV pending. Suspect etiology related to acute gastroenteritis leading to hypotension and hypovolemia. UDS with cocaine which may also be contributing.  - monitor CMP  Norovirus gastroenteritis, improving Improving with supportive measures.  Tolerating regular diet. Having loose stool but no further diarrhea  - Enteric precautions in place - imodium   Severe malnutrition Likely in setting of being unhomed -continue feeding supplement with ensure   Cough Nasal congestion Remains afebrile, without leukocytosis, on room air. COVID, flu, RSV negative.  CXR reassuring.  -Supportive care with robitussin   History of CAD - Atorvastatin discontinued in setting of elevated liver enzymes   Hypertension - Continue hydralazine 25 mg every 8 hours  Diet: regular IVF: none VTE: Lovenox Code: DNR PT/OT recommendations: No PT follow up   Discharge plan: likely discharge tomorrow, will discuss with TOC regarding housing  Iona Beard, MD 07/07/2022, 6:42 AM  Pager: [phone] After 5pm on weekdays and 1pm on weekends: [phone]

## 2022-07-08 ENCOUNTER — Other Ambulatory Visit (HOSPITAL_COMMUNITY): Payer: Self-pay

## 2022-07-08 DIAGNOSIS — R748 Abnormal levels of other serum enzymes: Secondary | ICD-10-CM | POA: Diagnosis not present

## 2022-07-08 LAB — COMPREHENSIVE METABOLIC PANEL
ALT: 177 U/L — ABNORMAL HIGH (ref 0–44)
AST: 115 U/L — ABNORMAL HIGH (ref 15–41)
Albumin: 2.4 g/dL — ABNORMAL LOW (ref 3.5–5.0)
Alkaline Phosphatase: 226 U/L — ABNORMAL HIGH (ref 38–126)
Anion gap: 8 (ref 5–15)
BUN: 21 mg/dL (ref 8–23)
CO2: 22 mmol/L (ref 22–32)
Calcium: 8.6 mg/dL — ABNORMAL LOW (ref 8.9–10.3)
Chloride: 108 mmol/L (ref 98–111)
Creatinine, Ser: 1.17 mg/dL — ABNORMAL HIGH (ref 0.44–1.00)
GFR, Estimated: 52 mL/min — ABNORMAL LOW (ref >60)
Glucose, Bld: 88 mg/dL (ref 70–99)
Potassium: 4.2 mmol/L (ref 3.5–5.1)
Sodium: 138 mmol/L (ref 135–145)
Total Bilirubin: <0.1 mg/dL — ABNORMAL LOW (ref 0.3–1.2)
Total Protein: 7.1 g/dL (ref 6.5–8.1)

## 2022-07-08 MED ORDER — HYDRALAZINE HCL 25 MG PO TABS
25.0000 mg | ORAL_TABLET | Freq: Three times a day (TID) | ORAL | 0 refills | Status: DC
  Filled 2022-07-08: qty 90, 30d supply, fill #0

## 2022-07-08 MED ORDER — TICAGRELOR 90 MG PO TABS
90.0000 mg | ORAL_TABLET | Freq: Two times a day (BID) | ORAL | 0 refills | Status: DC
  Filled 2022-07-08: qty 60, 30d supply, fill #0

## 2022-07-08 MED ORDER — ASPIRIN 81 MG PO TBEC
81.0000 mg | DELAYED_RELEASE_TABLET | Freq: Every day | ORAL | 0 refills | Status: DC
  Filled 2022-07-08: qty 30, 30d supply, fill #0

## 2022-07-08 MED ORDER — ATORVASTATIN CALCIUM 40 MG PO TABS
40.0000 mg | ORAL_TABLET | Freq: Every day | ORAL | 2 refills | Status: DC
  Filled 2022-07-08: qty 30, 30d supply, fill #0

## 2022-07-08 MED ORDER — NITROGLYCERIN 0.4 MG SL SUBL
0.4000 mg | SUBLINGUAL_TABLET | SUBLINGUAL | 0 refills | Status: DC | PRN
  Filled 2022-07-08: qty 25, 7d supply, fill #0

## 2022-07-08 MED ORDER — ISOSORBIDE MONONITRATE ER 30 MG PO TB24
15.0000 mg | ORAL_TABLET | Freq: Every day | ORAL | 0 refills | Status: DC
  Filled 2022-07-08: qty 15, 30d supply, fill #0

## 2022-07-08 MED ORDER — SERTRALINE HCL 25 MG PO TABS
25.0000 mg | ORAL_TABLET | Freq: Every day | ORAL | 0 refills | Status: DC
  Filled 2022-07-08: qty 30, 30d supply, fill #0

## 2022-07-08 NOTE — Progress Notes (Signed)
Physical Therapy Treatment Patient Details Name: Cassidy Larson MRN: [id number] DOB: September 09, 1955 Today's Date: 07/08/2022   History of Present Illness 67 y/o homeless female admitted 1/16 admitted for elevated liver enzymes and nausea vomiting, diarrhea.  Poor historian.  She tested positive for norovirus.  She has a transaminitis that is new, imaging does not show anything concerning in her liver, Doppler shows no thrombus. Pt also positive for cocaine and THC on admisiion.PMH:   Polysubstance abuse with ongoing cocaine use, CKD, CAD status post STEMI/stent placement Oct 2023, and lupus    PT Comments    Pt admitted with above diagnosis. Pt was able to ambulate with RW with good safety awareness and no LOB.  Pt reports apprehension about getting out of hospital as she states her living situation is poor.  Pt meeting goals.  Will continue PT.  Pt currently with functional limitations due to balance and endurance deficits. Pt will benefit from skilled PT to increase their independence and safety with mobility to allow discharge to the venue listed below.      Recommendations for follow up therapy are one component of a multi-disciplinary discharge planning process, led by the attending physician.  Recommendations may be updated based on patient status, additional functional criteria and insurance authorization.  Follow Up Recommendations  No PT follow up     Assistance Recommended at Discharge None  Patient can return home with the following Assist for transportation   Equipment Recommendations  Rolling walker (2 wheels)    Recommendations for Other Services       Precautions / Restrictions Precautions Precautions: Fall Precaution Comments: Norovirus Restrictions Weight Bearing Restrictions: No     Mobility  Bed Mobility Overal bed mobility: Independent                  Transfers Overall transfer level: Independent                 General transfer comment:  moves slowly    Ambulation/Gait Ambulation/Gait assistance: Supervision Gait Distance (Feet): 110 Feet Assistive device: Rolling walker (2 wheels) Gait Pattern/deviations: Step-through pattern, Decreased stride length   Gait velocity interpretation: <1.31 ft/sec, indicative of household ambulator   General Gait Details: Pt slow moving but steady with RW. No LOB.   Stairs             Wheelchair Mobility    Modified Rankin (Stroke Patients Only)       Balance Overall balance assessment: Independent                                          Cognition Arousal/Alertness: Awake/alert Behavior During Therapy: WFL for tasks assessed/performed Overall Cognitive Status: Within Functional Limits for tasks assessed                                          Exercises General Exercises - Lower Extremity Ankle Circles/Pumps: AROM, Both, 10 reps, Supine Long Arc Quad: AROM, Both, 5 reps, Seated    General Comments General comments (skin integrity, edema, etc.): VSS      Pertinent Vitals/Pain Pain Assessment Pain Assessment: No/denies pain    Home Living  Prior Function            PT Goals (current goals can now be found in the care plan section) Acute Rehab PT Goals Patient Stated Goal: to go home Progress towards PT goals: Progressing toward goals    Frequency    Min 3X/week      PT Plan Current plan remains appropriate    Co-evaluation              AM-PAC PT "6 Clicks" Mobility   Outcome Measure  Help needed turning from your back to your side while in a flat bed without using bedrails?: None Help needed moving from lying on your back to sitting on the side of a flat bed without using bedrails?: None Help needed moving to and from a bed to a chair (including a wheelchair)?: None Help needed standing up from a chair using your arms (e.g., wheelchair or bedside chair)?: A  Little Help needed to walk in hospital room?: A Little Help needed climbing 3-5 steps with a railing? : A Little 6 Click Score: 21    End of Session Equipment Utilized During Treatment: Gait belt Activity Tolerance: Patient tolerated treatment well Patient left: in bed;with call bell/phone within reach;with bed alarm set Nurse Communication: Mobility status PT Visit Diagnosis: Muscle weakness (generalized) (M62.81)     Time: 3428-7681 PT Time Calculation (min) (ACUTE ONLY): 24 min  Charges:  $Gait Training: 8-22 mins $Therapeutic Exercise: 8-22 mins                     [facility] M,PT Acute Rehab Services [place] 07/08/2022, 12:49 PM

## 2022-07-08 NOTE — Discharge Summary (Addendum)
Name: Cassidy Larson MRN: [id number] DOB: 02/01/1956 67 y.o. PCP: Kerin Perna, NP  Date of Admission: 07/02/2022  4:31 PM Date of Discharge: 07/08/2022 3:47 PM Attending Physician: Aldine Contes, MD  Discharge Diagnosis: Principal Problem:   Elevated liver enzymes Active Problems:   Hypertension   Substance abuse ([place])   Norovirus   CKD (chronic kidney disease), stage III (HCC)   Protein-calorie malnutrition, severe   Cough   Nasal congestion   Discharge Medications: Allergies as of 07/08/2022       Reactions   Asa [aspirin] Diarrhea, Nausea And Vomiting, Other (See Comments)   Severe stomach cramps Diarrhea (sometimes with blood)   Latex Itching        Medication List     STOP taking these medications    hydrALAZINE 25 MG tablet Commonly known as: APRESOLINE       TAKE these medications    aspirin EC 81 MG tablet Take 1 tablet (81 mg total) by mouth daily. Swallow whole. Start taking on: July 09, 2022   atorvastatin 40 MG tablet Commonly known as: LIPITOR Take 1 tablet (40 mg total) by mouth daily. What changed:  medication strength how much to take   isosorbide mononitrate 30 MG 24 hr tablet Commonly known as: IMDUR Take 0.5 tablets (15 mg total) by mouth daily.   nitroGLYCERIN 0.4 MG SL tablet Commonly known as: NITROSTAT Place 1 tablet (0.4 mg total) under the tongue every 5 (five) minutes as needed for chest pain.   sertraline 25 MG tablet Commonly known as: ZOLOFT Take 1 tablet (25 mg total) by mouth daily.   ticagrelor 90 MG Tabs tablet Commonly known as: BRILINTA Take 1 tablet (90 mg total) by mouth 2 (two) times daily.        Follow-up Appointments:  Follow-up Information     Kerin Perna, NP. Call.   Specialty: Internal Medicine Why: Call for an appointment as soon as possible after leaving the hospital. Contact information: [place] [address] [phone]                  Disposition and follow-up:   Ms. Roshawn Lacina is a 67 y.o. year old with history of ASCVD and MI status post PCI October 2023 who was treated in the hospital for norovirus gastroenteritis and transaminase elevation due to hypotension.  Norovirus gastroenteritis Did well with supportive therapy.  Elevated liver enzymes Mixed pattern of cholestatic and hepatocellular injury.  Probably secondary to hypotension from volume loss due to gastroenteritis.  Imaging was unremarkable. If persisting, consider drug/toxin mediated process versus autoimmune hepatitis. - CMP at follow-up and trend liver enzymes - HSV DNA pending  Left adrenal mass A.m. cortisol was normal this admission.  CAD Provided with 30-day supply of following medications from TOC upon discharge: - IMDUR  - Ticagrelor - Aspirin  Hospital Course by problem list: Chest pain The presenting concern.  Was atypical in nature, reproducible on palpation.  Not associated with ischemic EKG changes or significant troponin elevation.  ACS was ruled out.  Brilinta, aspirin, imdur and atorvastatin were restarted.  Atorvastatin was initially stopped during hospitalization out of concern for liver injury and was resumed on discharge.  Norovirus gastroenteritis Presents with diarrhea and hypotension.  GI panel was positive for norovirus.  She did well with IV fluids and conservative management.  Symptomatic management with Imodium.  Elevated liver enzymes Reported some abdominal pain on admission.  Transaminases and alkaline phosphatase were both elevated.  Right upper quadrant ultrasound was without correlating findings.  CT abdomen and pelvis showed no hepatobiliary abnormalities, but was suggestive of pyelonephritis.  Thus this patient got a dose of IV ceftriaxone before it was discontinued, as she had no other signs or symptoms of UTI.  Her liver enzymes decreased slowly over the course of her hospitalization.  Differential diagnosis  includes ischemic liver injury due to hypotension in setting of norovirus gastroenteritis, drug or toxin mediated process, autoimmune hepatitis.  Left adrenal mass Incidental finding but noted on prior scans. 2.3 cm in size. Findings suggestive of myelolipoma. AM cortisol was within normal limits.  Discharge Exam:  Still feeling weak. Very apprehensive about discharge to the street.   Blood pressure (!) 142/81, pulse (!) 54, temperature 98.5 F (36.9 C), temperature source Oral, resp. rate 16, height 5' 2.5" (1.588 m), weight 41.1 kg, SpO2 100 %.  Frail appearing Heart rate and rhythm are regular Breathing is regular and unlabored Abdomen is soft and nontender Skin is warm and dry Mood is anxious and irritated, affect is concordant  Pertinent studies and procedures:   Latest Reference Range & Units 07/08/22 04:32  Sodium 135 - 145 mmol/L 138  Potassium 3.5 - 5.1 mmol/L 4.2  Chloride 98 - 111 mmol/L 108  CO2 22 - 32 mmol/L 22  Glucose 70 - 99 mg/dL 88  BUN 8 - 23 mg/dL 21  Creatinine 0.44 - 1.00 mg/dL 1.17 (H)  Calcium 8.9 - 10.3 mg/dL 8.6 (L)  Anion gap 5 - 15  8  Alkaline Phosphatase 38 - 126 U/L 226 (H)  Albumin 3.5 - 5.0 g/dL 2.4 (L)  AST 15 - 41 U/L 115 (H)  ALT 0 - 44 U/L 177 (H)  Total Protein 6.5 - 8.1 g/dL 7.1  Total Bilirubin 0.3 - 1.2 mg/dL <0.1 (L)  GFR, Estimated >60 mL/min 52 (L)  (H): Data is abnormally high (L): Data is abnormally low   Latest Reference Range & Units 07/07/22 04:33  WBC 4.0 - 10.5 K/uL 4.9  RBC 3.87 - 5.11 MIL/uL 4.09  Hemoglobin 12.0 - 15.0 g/dL 11.9 (L)  HCT 36.0 - 46.0 % 37.2  MCV 80.0 - 100.0 fL 91.0  MCH 26.0 - 34.0 pg 29.1  MCHC 30.0 - 36.0 g/dL 32.0  RDW 11.5 - 15.5 % 14.2  Platelets 150 - 400 K/uL 190  nRBC 0.0 - 0.2 % 0.0  (L): Data is abnormally low  Right upper quadrant ultrasound IMPRESSION: No etiology for right upper quadrant pain identified.  CT abdomen pelvis with contrast IMPRESSION: No renal calculi or  obstructive patchy enhancement on delayed images suspicious for pyelonephritis.   Fluid noted throughout the colon and small bowel consistent with the diarrheal state.   Left adrenal mass measuring up to 2.3 cm with central fatty components. In retrospect the prior examination showed this lesion measuring approximately 1.9 cm. The fatty components suggest underlying myelolipoma. No further follow-up is recommended.  Discharge Instructions:   Discharge Instructions      Ms. Divina Neale  You were treated in the hospital for norovirus.  This is a common illness that causes nausea, vomiting, and diarrhea.  You became very dehydrated, which caused temporary irritation of your liver.  We are discharging you now that you are doing better. To help assist you on your road to recovery, I have written the following recommendations:  You will be discharged with a 30-day supply of the following medications: - Aspirin, once daily - Atorvastatin, once daily -  Hydralazine, 3 times daily - Sertraline once daily - Ticagrelor, twice daily  Stop taking: - Imdur - Nitroglycerin  Follow-up with your primary care doctor soon as possible after you leave the hospital.  It was a privilege to be a part of your hospital care team, and I hope you feel better as a result of your stay.  All the best, Nani Gasser, MD       Nani Gasser MD 07/08/2022, 3:47 PM

## 2022-07-08 NOTE — Progress Notes (Signed)
CSW obtained clothing items and sleeping bag from [facility] resource closet - RN CM delivered them to bedside with 4 bus passes.  Madilyn Fireman, MSW, LCSW Transitions of Care  Clinical Social Worker II [phone]

## 2022-07-08 NOTE — Progress Notes (Signed)
Mobility Specialist Progress Note:   07/08/22 1034  Mobility  Activity Ambulated with assistance in hallway  Level of Assistance Standby assist, set-up cues, supervision of patient - no hands on  Assistive Device Front wheel walker  Distance Ambulated (ft) 80 ft  Activity Response Tolerated well  Mobility Referral Yes  $Mobility charge 1 Mobility   Pt received in bed and agreeable. C/o BLE fatigue throughout session. Pt returned to bed with all needs met and call bell in reach.   Andrey Campanile Mobility Specialist Please contact via SecureChat or  Rehab office at [phone]

## 2022-07-08 NOTE — Discharge Instructions (Signed)
Ms. Cassidy Larson  You were treated in the hospital for norovirus.  This is a common illness that causes nausea, vomiting, and diarrhea.  You became very dehydrated, which caused temporary irritation of your liver.  We are discharging you now that you are doing better. To help assist you on your road to recovery, I have written the following recommendations:  You will be discharged with a 30-day supply of the following medications: - Aspirin, once daily - Atorvastatin, once daily - Hydralazine, 3 times daily - Sertraline once daily - Ticagrelor, twice daily  Stop taking: - Imdur - Nitroglycerin  Follow-up with your primary care doctor soon as possible after you leave the hospital.  It was a privilege to be a part of your hospital care team, and I hope you feel better as a result of your stay.  All the best, Nani Gasser, MD

## 2022-07-09 ENCOUNTER — Telehealth: Payer: Self-pay

## 2022-07-09 NOTE — Telephone Encounter (Signed)
Transition Care Management Unsuccessful Follow-up Telephone Call  Date of discharge and from where:  07/08/2022, [facility]   Attempts:  1st Attempt  Reason for unsuccessful TCM follow-up call:  Unable to reach patient-  she has no phone, no contacts, no address to send a letter and no MyChart.  She needs a hospital follow up appointment.  Her last visit with [name], NP was over 3 years ago.

## 2022-07-10 ENCOUNTER — Telehealth: Payer: Self-pay

## 2022-07-10 NOTE — Telephone Encounter (Signed)
Transition Care Management Unsuccessful Follow-up Telephone Call  Date of discharge and from where:  07/08/2022, [facility]  Attempts:  2nd Attempt  Reason for unsuccessful TCM follow-up call:  Unable to reach patient - she has no phone, no contacts, no address to send a letter and no MyChart.   She needs a hospital follow up appointment.  Her last visit with [name], NP was over 3 years ago. The phone  number for Ms Cassidy Larson is on her AVS

## 2022-07-11 ENCOUNTER — Telehealth: Payer: Self-pay

## 2022-07-11 NOTE — Telephone Encounter (Signed)
Transition Care Management Unsuccessful Follow-up Telephone Call  Date of discharge and from where:  07/08/2022, [facility]  Attempts:  3rd Attempt  Reason for unsuccessful TCM follow-up call:  Unable to reach patient -nothing has been added to demographics.  she has no phone, no contacts, no address to send a letter and no MyChart.   She needs a hospital follow up appointment.  Her last visit with [name], NP was over 3 years ago. The phone  number for Ms Oletta Lamas is on her AVS

## 2022-07-15 ENCOUNTER — Other Ambulatory Visit: Payer: Self-pay

## 2022-07-15 ENCOUNTER — Emergency Department (HOSPITAL_COMMUNITY)
Admission: EM | Admit: 2022-07-15 | Discharge: 2022-07-15 | Disposition: A | Discharge status: Home / Self Care | Payer: Medicaid Other | Attending: Emergency Medicine | Admitting: Emergency Medicine

## 2022-07-15 ENCOUNTER — Emergency Department (HOSPITAL_COMMUNITY): Payer: Medicaid Other

## 2022-07-15 DIAGNOSIS — Z5900 Homelessness unspecified: Secondary | ICD-10-CM | POA: Insufficient documentation

## 2022-07-15 DIAGNOSIS — I129 Hypertensive chronic kidney disease with stage 1 through stage 4 chronic kidney disease, or unspecified chronic kidney disease: Secondary | ICD-10-CM | POA: Diagnosis not present

## 2022-07-15 DIAGNOSIS — M791 Myalgia, unspecified site: Secondary | ICD-10-CM | POA: Diagnosis not present

## 2022-07-15 DIAGNOSIS — Z91040 Latex allergy status: Secondary | ICD-10-CM | POA: Insufficient documentation

## 2022-07-15 DIAGNOSIS — Z1152 Encounter for screening for COVID-19: Secondary | ICD-10-CM | POA: Diagnosis not present

## 2022-07-15 DIAGNOSIS — N189 Chronic kidney disease, unspecified: Secondary | ICD-10-CM | POA: Diagnosis not present

## 2022-07-15 DIAGNOSIS — Z7982 Long term (current) use of aspirin: Secondary | ICD-10-CM | POA: Insufficient documentation

## 2022-07-15 DIAGNOSIS — J00 Acute nasopharyngitis [common cold]: Secondary | ICD-10-CM | POA: Insufficient documentation

## 2022-07-15 LAB — RESP PANEL BY RT-PCR (RSV, FLU A&B, COVID)  RVPGX2
Influenza A by PCR: NEGATIVE
Influenza B by PCR: NEGATIVE
Resp Syncytial Virus by PCR: NEGATIVE
SARS Coronavirus 2 by RT PCR: NEGATIVE

## 2022-07-15 LAB — COMPREHENSIVE METABOLIC PANEL
ALT: 43 U/L (ref 0–44)
AST: 26 U/L (ref 15–41)
Albumin: 2.7 g/dL — ABNORMAL LOW (ref 3.5–5.0)
Alkaline Phosphatase: 136 U/L — ABNORMAL HIGH (ref 38–126)
Anion gap: 8 (ref 5–15)
BUN: 13 mg/dL (ref 8–23)
CO2: 25 mmol/L (ref 22–32)
Calcium: 8.7 mg/dL — ABNORMAL LOW (ref 8.9–10.3)
Chloride: 104 mmol/L (ref 98–111)
Creatinine, Ser: 1.14 mg/dL — ABNORMAL HIGH (ref 0.44–1.00)
GFR, Estimated: 54 mL/min — ABNORMAL LOW (ref >60)
Glucose, Bld: 79 mg/dL (ref 70–99)
Potassium: 3.8 mmol/L (ref 3.5–5.1)
Sodium: 137 mmol/L (ref 135–145)
Total Bilirubin: 0.2 mg/dL — ABNORMAL LOW (ref 0.3–1.2)
Total Protein: 7.7 g/dL (ref 6.5–8.1)

## 2022-07-15 LAB — URINALYSIS, ROUTINE W REFLEX MICROSCOPIC
Bilirubin Urine: NEGATIVE
Glucose, UA: NEGATIVE mg/dL
Hgb urine dipstick: NEGATIVE
Ketones, ur: NEGATIVE mg/dL
Nitrite: NEGATIVE
Protein, ur: 100 mg/dL — AB
Specific Gravity, Urine: 1.02 (ref 1.005–1.030)
pH: 5 (ref 5.0–8.0)

## 2022-07-15 LAB — CBC WITH DIFFERENTIAL/PLATELET
Abs Immature Granulocytes: 0.01 10*3/uL (ref 0.00–0.07)
Basophils Absolute: 0 10*3/uL (ref 0.0–0.1)
Basophils Relative: 0 %
Eosinophils Absolute: 0.1 10*3/uL (ref 0.0–0.5)
Eosinophils Relative: 2 %
HCT: 36.6 % (ref 36.0–46.0)
Hemoglobin: 11.8 g/dL — ABNORMAL LOW (ref 12.0–15.0)
Immature Granulocytes: 0 %
Lymphocytes Relative: 16 %
Lymphs Abs: 1.4 10*3/uL (ref 0.7–4.0)
MCH: 29.9 pg (ref 26.0–34.0)
MCHC: 32.2 g/dL (ref 30.0–36.0)
MCV: 92.9 fL (ref 80.0–100.0)
Monocytes Absolute: 0.8 10*3/uL (ref 0.1–1.0)
Monocytes Relative: 9 %
Neutro Abs: 6.7 10*3/uL (ref 1.7–7.7)
Neutrophils Relative %: 73 %
Platelets: 374 10*3/uL (ref 150–400)
RBC: 3.94 MIL/uL (ref 3.87–5.11)
RDW: 14.5 % (ref 11.5–15.5)
WBC: 9.1 10*3/uL (ref 4.0–10.5)
nRBC: 0 % (ref 0.0–0.2)

## 2022-07-15 LAB — BRAIN NATRIURETIC PEPTIDE: B Natriuretic Peptide: 365 pg/mL — ABNORMAL HIGH (ref 0.0–100.0)

## 2022-07-15 LAB — LIPASE, BLOOD: Lipase: 49 U/L (ref 11–51)

## 2022-07-15 MED ORDER — ACETAMINOPHEN 325 MG PO TABS
650.0000 mg | ORAL_TABLET | Freq: Once | ORAL | Status: AC
  Administered 2022-07-15: 650 mg via ORAL
  Filled 2022-07-15: qty 2

## 2022-07-15 NOTE — ED Triage Notes (Signed)
Patient arrived with EMS from a parking deck ( homeless) reports generalized body aches for 2 months .

## 2022-07-15 NOTE — ED Provider Notes (Signed)
[facility] Provider Note   CSN: [id number] Arrival date & time: 07/15/22  0141     History HTN, CKD, CAD Chief Complaint  Patient presents with   Gen. Body Aches    Cassidy Larson is a 67 y.o. female.  67 year old female with a past medical history of HTN, Lupus, CKD presented to the ED with a chief complaint of "I am cold ".  Patient reports she is currently living in the street, and has been draining over the last couple of days and she feels sick.  She does have multiple visits in the last couple of days to the emergency department for multiple complaints.  On today's visit she feels overall body aches, no respiratory symptoms, no nausea, no vomiting.  She was offered food, however she reports she has food of her own.  She needs a place to stay.  Social work with consulted.  The history is provided by the patient and medical records.       Home Medications Prior to Admission medications   Medication Sig Start Date End Date Taking? Authorizing Provider  aspirin EC 81 MG tablet Take 1 tablet (81 mg total) by mouth daily. Swallow whole. 07/09/22   Nani Gasser, MD  atorvastatin (LIPITOR) 40 MG tablet Take 1 tablet (40 mg total) by mouth daily. 07/08/22   Nani Gasser, MD  isosorbide mononitrate (IMDUR) 30 MG 24 hr tablet Take 0.5 tablets (15 mg total) by mouth daily. 07/08/22   Nani Gasser, MD  nitroGLYCERIN (NITROSTAT) 0.4 MG SL tablet Place 1 tablet (0.4 mg total) under the tongue every 5 (five) minutes as needed for chest pain. 07/08/22 07/08/23  Nani Gasser, MD  sertraline (ZOLOFT) 25 MG tablet Take 1 tablet (25 mg total) by mouth daily. 07/08/22 08/07/22  Nani Gasser, MD  ticagrelor (BRILINTA) 90 MG TABS tablet Take 1 tablet (90 mg total) by mouth 2 (two) times daily. 07/08/22   Nani Gasser, MD      Allergies    Asa [aspirin] and Latex    Review of Systems   Review of Systems  Constitutional:   Negative for fever.  HENT:  Negative for sore throat.   Respiratory:  Negative for shortness of breath.   Cardiovascular:  Negative for chest pain.  Gastrointestinal:  Negative for abdominal pain, nausea and vomiting.  Genitourinary:  Negative for flank pain.  Musculoskeletal:  Positive for myalgias.  Neurological:  Negative for syncope and headaches.  All other systems reviewed and are negative.   Physical Exam Updated Vital Signs BP (!) 123/105   Pulse 67   Temp (!) 97.5 F (36.4 C)   Resp 18   SpO2 93%  Physical Exam Vitals and nursing note reviewed.  Constitutional:      Appearance: Normal appearance.  HENT:     Head: Normocephalic and atraumatic.     Mouth/Throat:     Mouth: Mucous membranes are moist.  Eyes:     Pupils: Pupils are equal, round, and reactive to light.  Cardiovascular:     Rate and Rhythm: Normal rate.  Pulmonary:     Effort: Pulmonary effort is normal.  Abdominal:     General: Abdomen is flat.     Palpations: Abdomen is soft.  Musculoskeletal:     Cervical back: Normal range of motion and neck supple.  Skin:    General: Skin is warm and dry.  Neurological:     Mental Status: She is alert and oriented to  person, place, and time.     ED Results / Procedures / Treatments   Labs (all labs ordered are listed, but only abnormal results are displayed) Labs Reviewed  CBC WITH DIFFERENTIAL/PLATELET - Abnormal; Notable for the following components:      Result Value   Hemoglobin 11.8 (*)    All other components within normal limits  COMPREHENSIVE METABOLIC PANEL - Abnormal; Notable for the following components:   Creatinine, Ser 1.14 (*)    Calcium 8.7 (*)    Albumin 2.7 (*)    Alkaline Phosphatase 136 (*)    Total Bilirubin 0.2 (*)    GFR, Estimated 54 (*)    All other components within normal limits  URINALYSIS, ROUTINE W REFLEX MICROSCOPIC - Abnormal; Notable for the following components:   APPearance HAZY (*)    Protein, ur 100 (*)     Leukocytes,Ua TRACE (*)    Bacteria, UA RARE (*)    All other components within normal limits  BRAIN NATRIURETIC PEPTIDE - Abnormal; Notable for the following components:   B Natriuretic Peptide 365.0 (*)    All other components within normal limits  RESP PANEL BY RT-PCR (RSV, FLU A&B, COVID)  RVPGX2  URINE CULTURE  LIPASE, BLOOD    EKG None  Radiology DG Chest 2 View  Result Date: 07/15/2022 CLINICAL DATA:  Cough, shortness of breath EXAM: CHEST - 2 VIEW COMPARISON:  07/06/2022 FINDINGS: Mild linear scarring/atelectasis in the right lower lobe. Left lung is clear. No pleural effusion or pneumothorax. The heart is normal in size. Visualized osseous structures are within normal limits. IMPRESSION: No evidence of acute cardiopulmonary disease. Electronically Signed   By: Julian Hy M.D.   On: 07/15/2022 02:32    Procedures Procedures    Medications Ordered in ED Medications  acetaminophen (TYLENOL) tablet 650 mg (650 mg Oral Given 07/15/22 0206)    ED Course/ Medical Decision Making/ A&P Clinical Course as of 07/15/22 1151  Mon Jul 15, 2022  1136 Leukocytes,Ua(!): TRACE [JS]  1136 Bacteria, UA(!): RARE [JS]    Clinical Course User Index [JS] Janeece Fitting, PA-C                             Medical Decision Making   This patient presents to the ED for concern of feeling cold, this involves a number of treatment options, and is a complaint that carries with it a low risk of complications and morbidity.     Co morbidities: Discussed in HPI   Brief History:  Patient here with complaints on myalgias stating she was cold and she needs a place to stay. She was staying at the [facility] but has been sleeping outside and it has been raining. She is requesting blankets along with social work consultation.   EMR reviewed including pt PMHx, past surgical history and past visits to ER.   See HPI for more details   Lab Tests:  I ordered and independently interpreted labs.   The pertinent results include:    I personally reviewed all laboratory work and imaging. Metabolic panel without any acute abnormality specifically kidney function within normal limits and no significant electrolyte abnormalities. CBC without leukocytosis or significant anemia.   Imaging Studies:  NAD. I personally reviewed all imaging studies and no acute abnormality found. I agree with radiology interpretation.  Medicines ordered:  I ordered medication including tylenol  for symptomatic  Reevaluation of the patient after these medicines showed  that the patient improved I have reviewed the patients home medicines and have made adjustments as needed  Reevaluation:  After the interventions noted above I re-evaluated patient and found that they have :stayed the same   Social Determinants of Health:  The patient's social determinants of health were a factor in the care of this patient  Problem List / ED Course:  Patient arrives to the ED with a chief complaint of needing a place to stay.  She reports she is wet from the rain that is been ongoing over the last couple of days.  Patient is homeless currently, does have multiple visits for the same.  She was staying at the [facility], but according to records it looks like patient has tried to contact social work in the past, but they were unable to successfully talk to her as she does not have a phone line.  She does have 2 of her suitcases with her, stating that she needs a place to live.  I discussed with her that I contacted social work who will be seeing her at this time.  Patient does have an ongoing history of polysubstance abuse, she is currently using the [facility]. Interpretation of her labs reveal a CBC with no leukocytosis, hemoglobin is at her baseline.  CMP without any electrolyte derangement, creatinine levels within normal limits.  Lipase level is normal.  Urine with a trace of leukocytes and rare bacteria therefore sent for culture.   Respiratory panel negative for influenza, RSV, COVID-19.  BNP at 360, however no signs of fluid overload on exam.  Patient was given Tylenol. I discussed her condition with social work who provided her with a bus pass along with a taxi voucher in [facility] to get to the [facility] and able to shower.  There is no medical reason to keep patient in the hospital at this time.  Patient follow-up always encouraged.   Dispostion:  After consideration of the diagnostic results and the patients response to treatment, I feel that the patent would benefit from [facility].     Portions of this note were generated with Lobbyist. Dictation errors may occur despite best attempts at proofreading.   Final Clinical Impression(s) / ED Diagnoses Final diagnoses:  Myalgia  Homelessness    Rx / DC Orders ED Discharge Orders     None         Janeece Fitting, Hershal Coria 07/15/22 [place], [place], MD 07/15/22 [phone]

## 2022-07-15 NOTE — Progress Notes (Signed)
CSW spoke with patient who is seeking permanent housing. CSW asked patient what happened to her apartment that she had. Patient stated that the [facility] helped her and they forced her to give them her money and then they kicked her out of her apartment. CSW asked patient if she has any family in the area and patient stated she does not know where her family is located. Patient stated she has been traveling all her life. CSW asked patient if she receives SSI, patient stated she gets $900 and some change a month. CSW asked patient if she can alternate going to a shelter and a hotel monthly. CSW told patient that the [facility] usually has lower rates. Patient insisted on going to the [facility] by the airport. Patient stated she gets her money on Thursday. CSW also told patient that the [facility] is now open from 8:00PM-8:00AM daily and that will give her a warm place at night. Patient was not interested in going there. Patient stated she can't be outside in the cold. Patient stated that she is going to go to the closet shelter and then when she gets her money Thursday she will stay at the hotel. Patient also has drug history of cocaine.    CSW contacted the [employer] who are currently $65 a night and the [facility] by the airport is $163. CSW shared this information with patient.  CSW contacted the [facility] who denied cheating patient out of her money and kicking her out of her apartment.   Patient was given four bus passes and a taxi voucher so she can get to [employer], shelter, and hotel this week.

## 2022-07-15 NOTE — ED Provider Triage Note (Signed)
Emergency Medicine Provider Triage Evaluation Note  Cassidy Larson , a 67 y.o. female  was evaluated in triage.  Pt complains of body aches cough, abdominal pain after being out in the cold and rain. Homeless.   Review of Systems  Positive:  As above Negative: As above  Physical Exam  There were no vitals taken for this visit. Gen:   Awake, no distress   Resp:  Normal effort  MSK:   Moves extremities without difficulty  Other:  Rhonchi in bilateral lower lobes, coughing throughout exam, rhinorrhea.  Medical Decision Making  Medically screening exam initiated at 1:53 AM.  Appropriate orders placed.  Cassidy Larson was informed that the remainder of the evaluation will be completed by another provider, this initial triage assessment does not replace that evaluation, and the importance of remaining in the ED until their evaluation is complete.   This chart was dictated using voice recognition software, Dragon. Despite the best efforts of this provider to proofread and correct errors, errors may still occur which can change documentation meaning.    Emeline Darling, PA-C 07/15/22 0201

## 2022-07-15 NOTE — Discharge Instructions (Addendum)
[facility]  [facility]ENTER  OPENS AT 8:00 AM-3:00 PM REOPENS AT 8:00 PM  [address]  [place], [place] [address] [phone]  

## 2022-07-20 ENCOUNTER — Encounter (HOSPITAL_COMMUNITY): Payer: Self-pay | Admitting: Emergency Medicine

## 2022-07-20 ENCOUNTER — Emergency Department (HOSPITAL_COMMUNITY)
Admission: EM | Admit: 2022-07-20 | Discharge: 2022-07-20 | Disposition: A | Discharge status: Home / Self Care | Payer: Medicaid Other | Attending: Emergency Medicine | Admitting: Emergency Medicine

## 2022-07-20 ENCOUNTER — Emergency Department (HOSPITAL_COMMUNITY): Payer: Medicaid Other

## 2022-07-20 DIAGNOSIS — Z1152 Encounter for screening for COVID-19: Secondary | ICD-10-CM | POA: Insufficient documentation

## 2022-07-20 DIAGNOSIS — Z7982 Long term (current) use of aspirin: Secondary | ICD-10-CM | POA: Insufficient documentation

## 2022-07-20 DIAGNOSIS — Z91040 Latex allergy status: Secondary | ICD-10-CM | POA: Diagnosis not present

## 2022-07-20 DIAGNOSIS — I2510 Atherosclerotic heart disease of native coronary artery without angina pectoris: Secondary | ICD-10-CM | POA: Diagnosis not present

## 2022-07-20 DIAGNOSIS — N189 Chronic kidney disease, unspecified: Secondary | ICD-10-CM | POA: Diagnosis not present

## 2022-07-20 DIAGNOSIS — J189 Pneumonia, unspecified organism: Secondary | ICD-10-CM

## 2022-07-20 DIAGNOSIS — R079 Chest pain, unspecified: Secondary | ICD-10-CM | POA: Diagnosis present

## 2022-07-20 DIAGNOSIS — I129 Hypertensive chronic kidney disease with stage 1 through stage 4 chronic kidney disease, or unspecified chronic kidney disease: Secondary | ICD-10-CM | POA: Insufficient documentation

## 2022-07-20 DIAGNOSIS — J181 Lobar pneumonia, unspecified organism: Secondary | ICD-10-CM | POA: Insufficient documentation

## 2022-07-20 LAB — COMPREHENSIVE METABOLIC PANEL
ALT: 18 U/L (ref 0–44)
AST: 29 U/L (ref 15–41)
Albumin: 2.3 g/dL — ABNORMAL LOW (ref 3.5–5.0)
Alkaline Phosphatase: 85 U/L (ref 38–126)
Anion gap: 12 (ref 5–15)
BUN: 35 mg/dL — ABNORMAL HIGH (ref 8–23)
CO2: 22 mmol/L (ref 22–32)
Calcium: 8.1 mg/dL — ABNORMAL LOW (ref 8.9–10.3)
Chloride: 104 mmol/L (ref 98–111)
Creatinine, Ser: 1.68 mg/dL — ABNORMAL HIGH (ref 0.44–1.00)
GFR, Estimated: 34 mL/min — ABNORMAL LOW (ref >60)
Glucose, Bld: 115 mg/dL — ABNORMAL HIGH (ref 70–99)
Potassium: 3.9 mmol/L (ref 3.5–5.1)
Sodium: 138 mmol/L (ref 135–145)
Total Bilirubin: 0.5 mg/dL (ref 0.3–1.2)
Total Protein: 7.1 g/dL (ref 6.5–8.1)

## 2022-07-20 LAB — RESP PANEL BY RT-PCR (RSV, FLU A&B, COVID)  RVPGX2
Influenza A by PCR: NEGATIVE
Influenza B by PCR: NEGATIVE
Resp Syncytial Virus by PCR: NEGATIVE
SARS Coronavirus 2 by RT PCR: NEGATIVE

## 2022-07-20 LAB — CBC
HCT: 29.2 % — ABNORMAL LOW (ref 36.0–46.0)
Hemoglobin: 9.7 g/dL — ABNORMAL LOW (ref 12.0–15.0)
MCH: 30.1 pg (ref 26.0–34.0)
MCHC: 33.2 g/dL (ref 30.0–36.0)
MCV: 90.7 fL (ref 80.0–100.0)
Platelets: 269 10*3/uL (ref 150–400)
RBC: 3.22 MIL/uL — ABNORMAL LOW (ref 3.87–5.11)
RDW: 14.6 % (ref 11.5–15.5)
WBC: 16.5 10*3/uL — ABNORMAL HIGH (ref 4.0–10.5)
nRBC: 0 % (ref 0.0–0.2)

## 2022-07-20 LAB — TROPONIN I (HIGH SENSITIVITY)
Troponin I (High Sensitivity): 31 ng/L — ABNORMAL HIGH (ref <18)
Troponin I (High Sensitivity): 25 ng/L — ABNORMAL HIGH (ref <18)

## 2022-07-20 MED ORDER — AMOXICILLIN-POT CLAVULANATE 875-125 MG PO TABS: 1.0000 | ORAL_TABLET | Freq: Two times a day (BID) | ORAL | 0 refills | Status: AC

## 2022-07-20 MED ORDER — AZITHROMYCIN 250 MG PO TABS
500.0000 mg | ORAL_TABLET | Freq: Once | ORAL | Status: AC
  Administered 2022-07-20: 500 mg via ORAL
  Filled 2022-07-20: qty 2

## 2022-07-20 MED ORDER — SODIUM CHLORIDE 0.9 % IV SOLN
1.0000 g | Freq: Once | INTRAVENOUS | Status: AC
  Administered 2022-07-20: 1 g via INTRAVENOUS
  Filled 2022-07-20: qty 10

## 2022-07-20 MED ORDER — ACETAMINOPHEN 325 MG PO TABS
650.0000 mg | ORAL_TABLET | Freq: Once | ORAL | Status: AC
  Administered 2022-07-20: 650 mg via ORAL
  Filled 2022-07-20: qty 2

## 2022-07-20 MED ORDER — AZITHROMYCIN 250 MG PO TABS: 250.0000 mg | ORAL_TABLET | Freq: Every day | ORAL | 0 refills | Status: AC

## 2022-07-20 NOTE — ED Provider Notes (Signed)
[facility] Provider Note   CSN: [id number] Arrival date & time: 07/20/22  3299     History  Chief Complaint  Patient presents with   Chest Pain    Cassidy Larson is a 67 y.o. female with history of lupus, anxiety, hypertension, chronic kidney disease, polysubstance abuse, CAD s/p stent October 2023, hyperlipidemia who presents emergency department complaining of ongoing chest pain.  Patient states that she has been having similar chest pain going on for the past several months.  No worsening today.  However does report cough, and EMS reported hearing Rales on auscultation.  She was given 1 dose of nitro.  Patient reported allergy to aspirin.   Chest Pain Associated symptoms: cough, fatigue and shortness of breath        Home Medications Prior to Admission medications   Medication Sig Start Date End Date Taking? Authorizing Provider  amoxicillin-clavulanate (AUGMENTIN) 875-125 MG tablet Take 1 tablet by mouth every 12 (twelve) hours for 5 days. 07/20/22 07/25/22 Yes Montrae Braithwaite T, PA-C  azithromycin (ZITHROMAX) 250 MG tablet Take 1 tablet (250 mg total) by mouth daily for 5 days. Take 1 tablet daily 07/20/22 07/25/22 Yes Hussien Greenblatt T, PA-C  aspirin EC 81 MG tablet Take 1 tablet (81 mg total) by mouth daily. Swallow whole. Patient not taking: Reported on 07/20/2022 07/09/22   Nani Gasser, MD  atorvastatin (LIPITOR) 40 MG tablet Take 1 tablet (40 mg total) by mouth daily. Patient not taking: Reported on 07/20/2022 07/08/22   Nani Gasser, MD  isosorbide mononitrate (IMDUR) 30 MG 24 hr tablet Take 0.5 tablets (15 mg total) by mouth daily. Patient not taking: Reported on 07/20/2022 07/08/22   Nani Gasser, MD  nitroGLYCERIN (NITROSTAT) 0.4 MG SL tablet Place 1 tablet (0.4 mg total) under the tongue every 5 (five) minutes as needed for chest pain. Patient not taking: Reported on 07/20/2022 07/08/22 07/08/23  Nani Gasser, MD   sertraline (ZOLOFT) 25 MG tablet Take 1 tablet (25 mg total) by mouth daily. Patient not taking: Reported on 07/20/2022 07/08/22 08/07/22  Nani Gasser, MD  ticagrelor (BRILINTA) 90 MG TABS tablet Take 1 tablet (90 mg total) by mouth 2 (two) times daily. Patient not taking: Reported on 07/20/2022 07/08/22   Nani Gasser, MD      Allergies    Asa [aspirin], Latex, and Other    Review of Systems   Review of Systems  Constitutional:  Positive for fatigue.  Respiratory:  Positive for cough and shortness of breath.   Cardiovascular:  Positive for chest pain.  All other systems reviewed and are negative.   Physical Exam Updated Vital Signs BP 130/73   Pulse 65   Temp 98.8 F (37.1 C)   Resp 15   Ht 5' 2.5" (1.588 m)   Wt 41 kg   SpO2 98%   BMI 16.27 kg/m  Physical Exam Vitals and nursing note reviewed.  Constitutional:      Appearance: Normal appearance.  HENT:     Head: Normocephalic and atraumatic.  Eyes:     Conjunctiva/sclera: Conjunctivae normal.  Cardiovascular:     Rate and Rhythm: Normal rate and regular rhythm.  Pulmonary:     Effort: Pulmonary effort is normal. No accessory muscle usage or respiratory distress.     Breath sounds: No decreased air movement. Examination of the right-lower field reveals rales. Rales present.  Abdominal:     General: There is no distension.     Palpations: Abdomen  is soft.     Tenderness: There is no abdominal tenderness.  Skin:    General: Skin is warm and dry.  Neurological:     General: No focal deficit present.     Mental Status: She is alert.     ED Results / Procedures / Treatments   Labs (all labs ordered are listed, but only abnormal results are displayed) Labs Reviewed  CBC - Abnormal; Notable for the following components:      Result Value   WBC 16.5 (*)    RBC 3.22 (*)    Hemoglobin 9.7 (*)    HCT 29.2 (*)    All other components within normal limits  COMPREHENSIVE METABOLIC PANEL - Abnormal; Notable  for the following components:   Glucose, Bld 115 (*)    BUN 35 (*)    Creatinine, Ser 1.68 (*)    Calcium 8.1 (*)    Albumin 2.3 (*)    GFR, Estimated 34 (*)    All other components within normal limits  TROPONIN I (HIGH SENSITIVITY) - Abnormal; Notable for the following components:   Troponin I (High Sensitivity) 31 (*)    All other components within normal limits  TROPONIN I (HIGH SENSITIVITY) - Abnormal; Notable for the following components:   Troponin I (High Sensitivity) 25 (*)    All other components within normal limits  RESP PANEL BY RT-PCR (RSV, FLU A&B, COVID)  RVPGX2    EKG EKG Interpretation  Date/Time:  Saturday July 20 2022 10:03:12 EST Ventricular Rate:  67 PR Interval:  103 QRS Duration: 82 QT Interval:  416 QTC Calculation: 440 R Axis:   66 Text Interpretation: Sinus rhythm Short PR interval No significant change since last tracing Confirmed by Blanchie Dessert [phone]) on 07/20/2022 10:58:50 AM  Radiology DG Chest 2 View  Result Date: 07/20/2022 CLINICAL DATA:  Persistent cough. EXAM: CHEST - 2 VIEW COMPARISON:  07/15/2022 FINDINGS: Stable mild cardiac enlargement. Development of posterior right lower lobe pneumonia. No associated pleural effusion. No pulmonary edema or pneumothorax. The visualized skeletal structures are unremarkable. IMPRESSION: Development of posterior right lower lobe pneumonia. Electronically Signed   By: Aletta Edouard M.D.   On: 07/20/2022 10:42    Procedures Procedures    Medications Ordered in ED Medications  acetaminophen (TYLENOL) tablet 650 mg (has no administration in time range)  cefTRIAXone (ROCEPHIN) 1 g in sodium chloride 0.9 % 100 mL IVPB (0 g Intravenous Stopped 07/20/22 1303)  azithromycin (ZITHROMAX) tablet 500 mg (500 mg Oral Given 07/20/22 1229)    ED Course/ Medical Decision Making/ A&P                             Medical Decision Making Amount and/or Complexity of Data Reviewed Labs: ordered. Radiology:  ordered.  Risk Prescription drug management.  This patient is a 67 y.o. female  who presents to the ED for concern of chest pain for several months, cough x 1 week.   Differential diagnoses prior to evaluation: The emergent differential diagnosis includes, but is not limited to,  ACS, pericarditis, myocarditis, aortic dissection, PE, pneumothorax, esophageal spasm or rupture, chronic angina, pneumonia, bronchitis, GERD, reflux/PUD, biliary disease, pancreatitis, costochondritis, anxiety. This is not an exhaustive differential.   Past Medical History / Co-morbidities: lupus, anxiety, hypertension, chronic kidney disease, polysubstance abuse, CAD s/p stent October 2023, hyperlipidemia  Additional history: Chart reviewed. Pertinent results include: Patient most only seen in ER on 1/21 for myalgias  and housing instability.  At that time she was given Tylenol for pain, and her laboratory workup was unremarkable.  She was discharged in stable condition and given social work resources.  Physical Exam: Physical exam performed. The pertinent findings include: Normal vital signs, no acute distress.  No increased respiratory effort, but there are rales present in the right lower lobe to auscultation.  Normal oxygen saturation on room air.  Lab Tests/Imaging studies: I personally interpreted labs/imaging and the pertinent results include: Leukocytosis of 16.5, stable hemoglobin.  CMP grossly baseline, improved liver function compared to prior visits.  Initial troponin 31, delta troponin 25.  Respiratory panel negative for COVID, flu, RSV.  Chest x-ray with developing right lower lobe pneumonia. I agree with the radiologist interpretation.  Cardiac monitoring: EKG obtained and interpreted by my attending physician which shows: sinus rhythm   Medications: I ordered medication including antibiotics for CAP and tylenol.  I have reviewed the patients home medicines and have made adjustments as needed.    Disposition: After consideration of the diagnostic results and the patients response to treatment, I feel that emergency department workup does not suggest an emergent condition requiring admission or immediate intervention beyond what has been performed at this time. The plan is: discharge to home with housing resources, antibiotics for CAP. While patient did have a mildly elevated troponin, this appears to be chronic for her. Delta troponin downward trending which is reassuring.   Consulted with TOC who will provide patient resources and encourage follow up with [facility] and Wellness. The patient is safe for discharge and has been instructed to return immediately for worsening symptoms, change in symptoms or any other concerns.  I discussed this case with my attending physician Dr. Maryan Rued who cosigned this note including patient's presenting symptoms, physical exam, and planned diagnostics and interventions. Attending physician stated agreement with plan or made changes to plan which were implemented.   Final Clinical Impression(s) / ED Diagnoses Final diagnoses:  Community acquired pneumonia of right lower lobe of lung    Rx / DC Orders ED Discharge Orders          Ordered    amoxicillin-clavulanate (AUGMENTIN) 875-125 MG tablet  Every 12 hours        07/20/22 1331    azithromycin (ZITHROMAX) 250 MG tablet  Daily        07/20/22 1331           Portions of this report may have been transcribed using voice recognition software. Every effort was made to ensure accuracy; however, inadvertent computerized transcription errors may be present.    Estill Cotta 07/20/22 1342    Blanchie Dessert, MD 07/21/22 1118

## 2022-07-20 NOTE — Care Management (Signed)
Housing resources added to AVS

## 2022-07-20 NOTE — Discharge Instructions (Addendum)
You were seen in the ER today for chest pain.  As we discussed, you have developed pneumonia in the right lower lobe of your lung. We have started you on antibiotics and I'm prescribing the rest of the course for you to take for the next 5 days.  Please follow up with [facility] and Wellness.   Continue to monitor how you're doing and return to the ER for new or worsening symptoms.   [facility]) Monday - Friday 8am - 3pm          Sat & Sun 8am - 2pm [address] [place], [place] [address]   [phone]     [email or web address] [facility] offers among other critical resources: showers, laundry, barbershop, phone bank, mailroom, computer lab, medical clinic, gardens and a bike maintenance area.   [place]  (Men & women) [address] ([place] (Men/women/families) [address] [phone] x3   [facility] (Families with children) [phone] [address] ([place]) [place] [phone]   [facility] (Children ages 68-17) [address] [phone]   [facility]   (Women & children) [address] [place] [phone]   [facility] (Women/substance abuse) [place].  Home [phone]   [facility] (Men) [address]  [place] [phone]   [facility] (Men) [address] [phone]  [employer] (Women) [place]  [place] [phone]   [facility] (Single women & women with children) [address]  [place] [phone]  [facility] (Men/women/families) [address] [phone]    [facility]   (Domestic Violence shelter) [place] [phone]    [facility] (Men & women) [address]  [place] ([place] (Men) [address]  [employer] [phone] [place] (Men) [address] [phone]   [facility] (Single women & families) [address] [place] [phone]  [facility] (Men/women & families) [place].  [place] [phone]    If you are at risk of losing your housing (throughout [facility]) call the [place] at [phone]. You may also contact 2-1-1, a FREE service of the [place] [address] that provides information about many resources including housing. Dial 211, or visit online at CustodianSupply.fi. [place], contact Margretta Ditty [phone] (men only)                          Natale Lay in [place]:  74 day homeless program for women and men;                             contact Rev. Chambers Neeses:  men/women/children [place] in [place], [place] [phone]                       [facility] in [place], [place]   [facility]:  [place] for Abused Women, [phone]; (women and children)  [facility]:  [name], men/women/children; [phone]  [place] in [place], [phone]; substance abuse halfway house for men              [facility]; 4 bedroom house in [facility] for homeless women, contact [place]               [facility] in [place], [phone] (women and children)               [facility], for abused women and children, 24 hour crisis line, [phone], [place], halfway house for women, [place], [place]:  [facility], [phone]; open Mon-Thurs from MWN02 - March 15  when temp is below 32 degrees                              [facility]; [place], [phone]; cell [phone]; open 24/7  [facility]:  Outreach for [place] - [phone]  [place]:  transitional housing for women and children; Jeanie Cooks [phone]

## 2022-07-20 NOTE — ED Triage Notes (Signed)
[place] since [place]. Endorses cough and EMS reports rales. EKG unremarkable. EMS gave 1 nitro, pt allergic to ASA.

## 2022-08-04 ENCOUNTER — Emergency Department (HOSPITAL_COMMUNITY)
Admission: EM | Admit: 2022-08-04 | Discharge: 2022-08-04 | Disposition: A | Discharge status: Home / Self Care | Payer: Medicaid Other | Attending: Emergency Medicine | Admitting: Emergency Medicine

## 2022-08-04 ENCOUNTER — Other Ambulatory Visit: Payer: Self-pay

## 2022-08-04 ENCOUNTER — Encounter (HOSPITAL_COMMUNITY): Payer: Self-pay | Admitting: Emergency Medicine

## 2022-08-04 DIAGNOSIS — M79604 Pain in right leg: Secondary | ICD-10-CM

## 2022-08-04 DIAGNOSIS — M79605 Pain in left leg: Secondary | ICD-10-CM | POA: Insufficient documentation

## 2022-08-04 DIAGNOSIS — I2510 Atherosclerotic heart disease of native coronary artery without angina pectoris: Secondary | ICD-10-CM | POA: Insufficient documentation

## 2022-08-04 DIAGNOSIS — I129 Hypertensive chronic kidney disease with stage 1 through stage 4 chronic kidney disease, or unspecified chronic kidney disease: Secondary | ICD-10-CM | POA: Diagnosis not present

## 2022-08-04 DIAGNOSIS — Z91040 Latex allergy status: Secondary | ICD-10-CM | POA: Insufficient documentation

## 2022-08-04 DIAGNOSIS — N184 Chronic kidney disease, stage 4 (severe): Secondary | ICD-10-CM | POA: Insufficient documentation

## 2022-08-04 HISTORY — DX: Encounter for other specified aftercare: Z51.89

## 2022-08-04 LAB — CBC WITH DIFFERENTIAL/PLATELET
Abs Immature Granulocytes: 0.01 10*3/uL (ref 0.00–0.07)
Basophils Absolute: 0 10*3/uL (ref 0.0–0.1)
Basophils Relative: 1 %
Eosinophils Absolute: 0.1 10*3/uL (ref 0.0–0.5)
Eosinophils Relative: 2 %
HCT: 29.7 % — ABNORMAL LOW (ref 36.0–46.0)
Hemoglobin: 9.7 g/dL — ABNORMAL LOW (ref 12.0–15.0)
Immature Granulocytes: 0 %
Lymphocytes Relative: 23 %
Lymphs Abs: 1.2 10*3/uL (ref 0.7–4.0)
MCH: 29.7 pg (ref 26.0–34.0)
MCHC: 32.7 g/dL (ref 30.0–36.0)
MCV: 90.8 fL (ref 80.0–100.0)
Monocytes Absolute: 0.7 10*3/uL (ref 0.1–1.0)
Monocytes Relative: 12 %
Neutro Abs: 3.3 10*3/uL (ref 1.7–7.7)
Neutrophils Relative %: 62 %
Platelets: 271 10*3/uL (ref 150–400)
RBC: 3.27 MIL/uL — ABNORMAL LOW (ref 3.87–5.11)
RDW: 15.7 % — ABNORMAL HIGH (ref 11.5–15.5)
WBC: 5.3 10*3/uL (ref 4.0–10.5)
nRBC: 0 % (ref 0.0–0.2)

## 2022-08-04 LAB — BASIC METABOLIC PANEL
Anion gap: 10 (ref 5–15)
BUN: 23 mg/dL (ref 8–23)
CO2: 27 mmol/L (ref 22–32)
Calcium: 8.4 mg/dL — ABNORMAL LOW (ref 8.9–10.3)
Chloride: 102 mmol/L (ref 98–111)
Creatinine, Ser: 1.27 mg/dL — ABNORMAL HIGH (ref 0.44–1.00)
GFR, Estimated: 47 mL/min — ABNORMAL LOW (ref >60)
Glucose, Bld: 88 mg/dL (ref 70–99)
Potassium: 3.7 mmol/L (ref 3.5–5.1)
Sodium: 139 mmol/L (ref 135–145)

## 2022-08-04 MED ORDER — ACETAMINOPHEN 325 MG PO TABS: 650.0000 mg | ORAL_TABLET | Freq: Once | ORAL | Status: DC

## 2022-08-04 NOTE — ED Triage Notes (Signed)
Pt BIB EMS from the [facility] for BIL leg pain, pt states that it feels like a muscle spasm/cramp. Denies injury/fall.  EMS VS: 138/92 HR 90 98% RA

## 2022-08-04 NOTE — Discharge Instructions (Signed)
You are seen in the ER today for your leg pain your physical exam and blood work was reassuring.  You may take Tylenol as needed for your aching.  Follow-up with your primary care doctor and return to the ER with any new severe symptoms.

## 2022-08-04 NOTE — ED Provider Notes (Addendum)
[facility] Provider Larson   CSN: [id number] Arrival date & time: 08/04/22  0116     History  Chief Complaint  Patient presents with   Leg Pain    Cassidy Larson is a 67 y.o. female who presents via EMS from the [facility] with concern for BLE muscle spasms and pain that she states occur occasionally after walking all day. Patient is homeless and is outside primarily. Sleeping soundly, has to be woken up multiple times throughout exam for interview. Denies other concerns. Denies weakness or numbness/tingling in the legs.  Denies injuries or falls  I have reviewed her medical records she has hx of CAD s/p angioplasty, STEMI, polysubstance abuse, HTN, CKD stage IV. Not currently taking any antiplatelet medications.   HPI     Home Medications Prior to Admission medications   Medication Sig Start Date End Date Taking? Authorizing Provider  aspirin EC 81 MG tablet Take 1 tablet (81 mg total) by mouth daily. Swallow whole. Patient not taking: Reported on 07/20/2022 07/09/22   Nani Gasser, MD  atorvastatin (LIPITOR) 40 MG tablet Take 1 tablet (40 mg total) by mouth daily. Patient not taking: Reported on 07/20/2022 07/08/22   Nani Gasser, MD  isosorbide mononitrate (IMDUR) 30 MG 24 hr tablet Take 0.5 tablets (15 mg total) by mouth daily. Patient not taking: Reported on 07/20/2022 07/08/22   Nani Gasser, MD  nitroGLYCERIN (NITROSTAT) 0.4 MG SL tablet Place 1 tablet (0.4 mg total) under the tongue every 5 (five) minutes as needed for chest pain. Patient not taking: Reported on 07/20/2022 07/08/22 07/08/23  Nani Gasser, MD  sertraline (ZOLOFT) 25 MG tablet Take 1 tablet (25 mg total) by mouth daily. Patient not taking: Reported on 07/20/2022 07/08/22 08/07/22  Nani Gasser, MD  ticagrelor (BRILINTA) 90 MG TABS tablet Take 1 tablet (90 mg total) by mouth 2 (two) times daily. Patient not taking: Reported on 07/20/2022 07/08/22   Nani Gasser, MD      Allergies    Asa [aspirin], Latex, and Other    Review of Systems   Review of Systems  Musculoskeletal:  Positive for myalgias.    Physical Exam Updated Vital Signs BP 133/73   Pulse 66   Temp 98.1 F (36.7 C) (Oral)   Resp 17   Ht 5' 2.5" (1.588 m)   Wt 41 kg   SpO2 98%   BMI 16.27 kg/m  Physical Exam Vitals and nursing Larson reviewed.  Constitutional:      Appearance: She is not ill-appearing or toxic-appearing.  HENT:     Head: Normocephalic and atraumatic.     Mouth/Throat:     Mouth: Mucous membranes are moist.     Pharynx: No oropharyngeal exudate or posterior oropharyngeal erythema.  Eyes:     General:        Right eye: No discharge.        Left eye: No discharge.     Conjunctiva/sclera: Conjunctivae normal.  Cardiovascular:     Rate and Rhythm: Normal rate and regular rhythm.     Pulses: Normal pulses.          Dorsalis pedis pulses are 2+ on the right side and 2+ on the left side.     Heart sounds: Normal heart sounds. No murmur heard.    Comments: No Pulmonary:     Effort: Pulmonary effort is normal. No respiratory distress.     Breath sounds: Normal breath sounds. No wheezing or rales.  Abdominal:     General: Bowel sounds are normal. There is no distension.     Palpations: Abdomen is soft.     Tenderness: There is no abdominal tenderness. There is no guarding or rebound.  Musculoskeletal:        General: No deformity.     Cervical back: Neck supple.     Right lower leg: No edema.     Left lower leg: No edema.  Skin:    General: Skin is warm and dry.     Capillary Refill: Capillary refill takes less than 2 seconds.  Neurological:     General: No focal deficit present.     Mental Status: She is alert and oriented to person, place, and time. Mental status is at baseline.  Psychiatric:        Mood and Affect: Mood normal.     ED Results / Procedures / Treatments   Labs (all labs ordered are listed, but only abnormal results  are displayed) Labs Reviewed  CBC WITH DIFFERENTIAL/PLATELET - Abnormal; Notable for the following components:      Result Value   RBC 3.27 (*)    Hemoglobin 9.7 (*)    HCT 29.7 (*)    RDW 15.7 (*)    All other components within normal limits  BASIC METABOLIC PANEL - Abnormal; Notable for the following components:   Creatinine, Ser 1.27 (*)    Calcium 8.4 (*)    GFR, Estimated 47 (*)    All other components within normal limits    EKG None  Radiology No results found.  Procedures Procedures    Medications Ordered in ED Medications  acetaminophen (TYLENOL) tablet 650 mg (has no administration in time range)    ED Course/ Medical Decision Making/ A&P Clinical Course as of 08/04/22 0448  Sun Aug 04, 2022  0447 Patient able to walk unassisted per ED RN Eddie Dibbles.  [RS]    Clinical Course User Index [RS] Zayvion Stailey, Gypsy Balsam, PA-C                             Medical Decision Making 67 year old female who presents with concern for BLE muscle cramps and pain.   VS normal. Cardiopulmonary exam is normal, abdominal exam is benign. Neurovascularly intact in BLE, with normal strength and sensation.   Amount and/or Complexity of Data Reviewed Labs: ordered.    Details: CBC with hemoglobin of 9.7 at patient's baseline.  No leukocytosis.  BMP with baseline creatinine of 1.2, normal potassium.  Risk OTC drugs.   Clinical picture most consistent with muscle fatigue, possible cramping likely related to patient's lack of housing and extensive walking daily. Neurovascularly intact, clinical concern for emergent underlying etiology that would warrant further ED workup or inpatient management is exceedingly low. Ambulatory in the ED.   No indication for admission at this time. [place] voiced understanding of her medical evaluation and treatment plan. Each of their questions answered to their expressed satisfaction.  Return precautions were given.  Patient is well-appearing, stable,  and was discharged in good condition.  This chart was dictated using voice recognition software, Dragon. Despite the best efforts of this provider to proofread and correct errors, errors may still occur which can change documentation meaning.   Final Clinical Impression(s) / ED Diagnoses Final diagnoses:  None    Rx / DC Orders ED Discharge Orders     None  Emeline Darling, PA-C 08/04/22 0448    Meridith Romick, Gypsy Balsam, PA-C 08/04/22 0449    Maudie Flakes, MD 08/04/22 [phone]

## 2022-08-29 ENCOUNTER — Other Ambulatory Visit: Payer: Self-pay

## 2022-08-29 ENCOUNTER — Emergency Department (HOSPITAL_COMMUNITY): Payer: Medicaid Other

## 2022-08-29 ENCOUNTER — Encounter (HOSPITAL_COMMUNITY): Payer: Self-pay

## 2022-08-29 ENCOUNTER — Emergency Department (HOSPITAL_COMMUNITY)
Admission: EM | Admit: 2022-08-29 | Discharge: 2022-08-29 | Disposition: A | Discharge status: Home / Self Care | Payer: Medicaid Other | Attending: Emergency Medicine | Admitting: Emergency Medicine

## 2022-08-29 DIAGNOSIS — I2510 Atherosclerotic heart disease of native coronary artery without angina pectoris: Secondary | ICD-10-CM | POA: Insufficient documentation

## 2022-08-29 DIAGNOSIS — U071 COVID-19: Secondary | ICD-10-CM | POA: Insufficient documentation

## 2022-08-29 DIAGNOSIS — Z91040 Latex allergy status: Secondary | ICD-10-CM | POA: Insufficient documentation

## 2022-08-29 DIAGNOSIS — Z7982 Long term (current) use of aspirin: Secondary | ICD-10-CM | POA: Insufficient documentation

## 2022-08-29 DIAGNOSIS — R531 Weakness: Secondary | ICD-10-CM | POA: Diagnosis present

## 2022-08-29 LAB — URINALYSIS, ROUTINE W REFLEX MICROSCOPIC
Bilirubin Urine: NEGATIVE
Glucose, UA: NEGATIVE mg/dL
Hgb urine dipstick: NEGATIVE
Ketones, ur: NEGATIVE mg/dL
Nitrite: NEGATIVE
Protein, ur: 100 mg/dL — AB
Specific Gravity, Urine: 1.018 (ref 1.005–1.030)
pH: 5 (ref 5.0–8.0)

## 2022-08-29 LAB — CBC
HCT: 35.2 % — ABNORMAL LOW (ref 36.0–46.0)
Hemoglobin: 10.9 g/dL — ABNORMAL LOW (ref 12.0–15.0)
MCH: 29.1 pg (ref 26.0–34.0)
MCHC: 31 g/dL (ref 30.0–36.0)
MCV: 94.1 fL (ref 80.0–100.0)
Platelets: 259 10*3/uL (ref 150–400)
RBC: 3.74 MIL/uL — ABNORMAL LOW (ref 3.87–5.11)
RDW: 16.2 % — ABNORMAL HIGH (ref 11.5–15.5)
WBC: 6.3 10*3/uL (ref 4.0–10.5)
nRBC: 0 % (ref 0.0–0.2)

## 2022-08-29 LAB — BASIC METABOLIC PANEL
Anion gap: 9 (ref 5–15)
BUN: 15 mg/dL (ref 8–23)
CO2: 19 mmol/L — ABNORMAL LOW (ref 22–32)
Calcium: 8.4 mg/dL — ABNORMAL LOW (ref 8.9–10.3)
Chloride: 105 mmol/L (ref 98–111)
Creatinine, Ser: 1.36 mg/dL — ABNORMAL HIGH (ref 0.44–1.00)
GFR, Estimated: 43 mL/min — ABNORMAL LOW (ref >60)
Glucose, Bld: 79 mg/dL (ref 70–99)
Potassium: 4.2 mmol/L (ref 3.5–5.1)
Sodium: 133 mmol/L — ABNORMAL LOW (ref 135–145)

## 2022-08-29 LAB — RESP PANEL BY RT-PCR (RSV, FLU A&B, COVID)  RVPGX2
Influenza A by PCR: NEGATIVE
Influenza B by PCR: NEGATIVE
Resp Syncytial Virus by PCR: NEGATIVE
SARS Coronavirus 2 by RT PCR: POSITIVE — AB

## 2022-08-29 LAB — TROPONIN I (HIGH SENSITIVITY)
Troponin I (High Sensitivity): 11 ng/L (ref <18)
Troponin I (High Sensitivity): 13 ng/L (ref <18)

## 2022-08-29 MED ORDER — SODIUM CHLORIDE 0.9 % IV BOLUS
1000.0000 mL | Freq: Once | INTRAVENOUS | Status: AC
  Administered 2022-08-29: 1000 mL via INTRAVENOUS

## 2022-08-29 MED ORDER — METOCLOPRAMIDE HCL 5 MG/ML IJ SOLN
5.0000 mg | Freq: Once | INTRAMUSCULAR | Status: AC
  Administered 2022-08-29: 5 mg via INTRAVENOUS
  Filled 2022-08-29: qty 2

## 2022-08-29 MED ORDER — ACETAMINOPHEN 325 MG PO TABS
650.0000 mg | ORAL_TABLET | Freq: Once | ORAL | Status: AC
  Administered 2022-08-29: 650 mg via ORAL
  Filled 2022-08-29: qty 2

## 2022-08-29 NOTE — ED Triage Notes (Signed)
Pt BIBA from [facility]. Pt c/o chest pain and weakness x 1 week. Pt states weakness has lead to falls- no injury.   VSS with EMS

## 2022-08-29 NOTE — ED Provider Notes (Signed)
[facility] Provider Note   CSN: [id number] Arrival date & time: 08/29/22  1315     History  Chief Complaint  Patient presents with   Chest Pain   Weakness    Cassidy Larson is a 67 y.o. female presented to ED complaining of generalized weakness.  This has been ongoing for "a few days".  She is brought in by ambulance from the [facility].  She reports has intermittent chest pains but nothing specific and cannot quantify them for me.  She reports has been have a headache and muscle aches as well and feels that her urine is darker than normal.  HPI     Home Medications Prior to Admission medications   Medication Sig Start Date End Date Taking? Authorizing Provider  aspirin EC 81 MG tablet Take 1 tablet (81 mg total) by mouth daily. Swallow whole. Patient not taking: Reported on 07/20/2022 07/09/22   Nani Gasser, MD  atorvastatin (LIPITOR) 40 MG tablet Take 1 tablet (40 mg total) by mouth daily. Patient not taking: Reported on 07/20/2022 07/08/22   Nani Gasser, MD  isosorbide mononitrate (IMDUR) 30 MG 24 hr tablet Take 0.5 tablets (15 mg total) by mouth daily. Patient not taking: Reported on 07/20/2022 07/08/22   Nani Gasser, MD  nitroGLYCERIN (NITROSTAT) 0.4 MG SL tablet Place 1 tablet (0.4 mg total) under the tongue every 5 (five) minutes as needed for chest pain. Patient not taking: Reported on 07/20/2022 07/08/22 07/08/23  Nani Gasser, MD  sertraline (ZOLOFT) 25 MG tablet Take 1 tablet (25 mg total) by mouth daily. Patient not taking: Reported on 07/20/2022 07/08/22 08/07/22  Nani Gasser, MD  ticagrelor (BRILINTA) 90 MG TABS tablet Take 1 tablet (90 mg total) by mouth 2 (two) times daily. Patient not taking: Reported on 07/20/2022 07/08/22   Nani Gasser, MD      Allergies    Asa [aspirin], Latex, and Other    Review of Systems   Review of Systems  Physical Exam Updated Vital Signs BP (!) 145/68 (BP Location: Right  Arm)   Pulse 78   Temp 100 F (37.8 C) (Oral)   Resp 18   Ht '5\' 2"'$  (1.575 m)   Wt 40.8 kg   SpO2 97%   BMI 16.46 kg/m  Physical Exam Constitutional:      General: She is not in acute distress. HENT:     Head: Normocephalic and atraumatic.  Eyes:     Conjunctiva/sclera: Conjunctivae normal.     Pupils: Pupils are equal, round, and reactive to light.  Cardiovascular:     Rate and Rhythm: Normal rate and regular rhythm.  Pulmonary:     Effort: Pulmonary effort is normal. No respiratory distress.  Abdominal:     General: There is no distension.     Tenderness: There is no abdominal tenderness.  Skin:    General: Skin is warm and dry.  Neurological:     General: No focal deficit present.     Mental Status: She is alert. Mental status is at baseline.  Psychiatric:        Mood and Affect: Mood normal.        Behavior: Behavior normal.     ED Results / Procedures / Treatments   Labs (all labs ordered are listed, but only abnormal results are displayed) Labs Reviewed  RESP PANEL BY RT-PCR (RSV, FLU A&B, COVID)  RVPGX2 - Abnormal; Notable for the following components:      Result  Value   SARS Coronavirus 2 by RT PCR POSITIVE (*)    All other components within normal limits  BASIC METABOLIC PANEL - Abnormal; Notable for the following components:   Sodium 133 (*)    CO2 19 (*)    Creatinine, Ser 1.36 (*)    Calcium 8.4 (*)    GFR, Estimated 43 (*)    All other components within normal limits  CBC - Abnormal; Notable for the following components:   RBC 3.74 (*)    Hemoglobin 10.9 (*)    HCT 35.2 (*)    RDW 16.2 (*)    All other components within normal limits  URINALYSIS, ROUTINE W REFLEX MICROSCOPIC - Abnormal; Notable for the following components:   APPearance CLOUDY (*)    Protein, ur 100 (*)    Leukocytes,Ua SMALL (*)    Bacteria, UA RARE (*)    All other components within normal limits  TROPONIN I (HIGH SENSITIVITY)  TROPONIN I (HIGH SENSITIVITY)    EKG EKG  Interpretation  Date/Time:  Thursday August 29 2022 13:27:42 EDT Ventricular Rate:  71 PR Interval:  80 QRS Duration: 78 QT Interval:  380 QTC Calculation: 412 R Axis:   53 Text Interpretation: Sinus rhythm with short PR Minimal voltage criteria for LVH, may be normal variant ( Cornell product ) When compared with ECG of 20-Jul-2022 10:03, PREVIOUS ECG IS PRESENT No STEMI Confirmed by Octaviano Glow [phone]) on 08/29/2022 7:04:02 PM  Radiology DG Chest 2 View  Result Date: 08/29/2022 CLINICAL DATA:  One-week history of chest pain and weakness with falls EXAM: CHEST - 2 VIEW COMPARISON:  Chest radiograph dated 07/20/2022 FINDINGS: Hyperinflated lungs. No focal consolidations. No pleural effusion or pneumothorax. The heart size and mediastinal contours are within normal limits. No radiographic finding of acute displaced fracture. Ill-defined radiodensity projects over the right neck, unchanged. IMPRESSION: 1. No active cardiopulmonary disease. 2.  No radiographic finding of acute displaced fracture. Electronically Signed   By: Darrin Nipper M.D.   On: 08/29/2022 14:06    Procedures Procedures    Medications Ordered in ED Medications  sodium chloride 0.9 % bolus 1,000 mL (1,000 mLs Intravenous New Bag/Given 08/29/22 2010)  acetaminophen (TYLENOL) tablet 650 mg (650 mg Oral Given 08/29/22 2008)  metoCLOPramide (REGLAN) injection 5 mg (5 mg Intravenous Given 08/29/22 2008)    ED Course/ Medical Decision Making/ A&P                             Medical Decision Making Amount and/or Complexity of Data Reviewed Labs: ordered. Radiology: ordered.  Risk OTC drugs. Prescription drug management.   This patient presents to the ED with concern for muscle aches, body aches, chest pain and chills. This involves an extensive number of treatment options, and is a complaint that carries with it a high risk of complications and morbidity.  The differential diagnosis includes viral illness versus other  infection versus atypical ACS versus other  Co-morbidities that complicate the patient evaluation: History of coronary disease had a risk of coronary complication  I ordered and personally interpreted labs.  The pertinent results include: COVID test is positive.  Remainder of labs are unremarkable or near baseline levels.  I ordered imaging studies including x-ray of the chest I independently visualized and interpreted imaging which showed no emergent findings I agree with the radiologist interpretation  The patient was maintained on a cardiac monitor.  I personally viewed and interpreted  the cardiac monitored which showed an underlying rhythm of: Sinus rhythm  Per my interpretation the patient's ECG shows no acute ischemic findings  I ordered medication including IV fluids, Tylenol and Reglan for hydration, body aches and headache  I have reviewed the patients home medicines and have made adjustments as needed  Test Considered: I have a low suspicion for acute PE, and  I do not see an indication for CT angiogram at this time  After the interventions noted above, I reevaluated the patient and found that they have: improved  Social Determinants of Health: patient may need assistance returning to the [facility] shelter.  Dispostion:  After consideration of the diagnostic results and the patients response to treatment, I feel that the patent would benefit from outpatient follow-up.  At this point it appears the patient is here using the symptoms for several days, and is not requiring hospitalization, is not hypoxic.  There is no indication for antibiotics.  She is likely outside the window to initiate antiviral therapy and is also taking Brilinta which reacts adversely with paxlovid.  Therefore we have not initiated antiviral therapy at this time.         Final Clinical Impression(s) / ED Diagnoses Final diagnoses:  [id number]    Rx / DC Orders ED Discharge Orders     None          Wyvonnia Dusky, MD 08/29/22 2235

## 2022-08-29 NOTE — Discharge Instructions (Addendum)
Cassidy Larson was diagnosed with Covid-19 today.  This is a virus that is typically contagious for the first 3 to 5 days of symptoms.  She should wear a mask and remember to wash her hands until day 5 of her symptoms.  Thankfully, the rest of her blood test, vital signs, and x-rays were reassuring, and did not show any medical emergency that required hospitalization.  She is stable to be discharged and manage this as an outpatient.  I would recommend that she take ibuprofen and Tylenol as needed for muscle aches and pains.

## 2022-08-30 ENCOUNTER — Encounter (HOSPITAL_COMMUNITY): Payer: Self-pay

## 2022-08-30 ENCOUNTER — Emergency Department (HOSPITAL_COMMUNITY)
Admission: EM | Admit: 2022-08-30 | Discharge: 2022-08-31 | Disposition: A | Discharge status: Home / Self Care | Payer: Medicaid Other | Attending: Emergency Medicine | Admitting: Emergency Medicine

## 2022-08-30 DIAGNOSIS — N184 Chronic kidney disease, stage 4 (severe): Secondary | ICD-10-CM | POA: Diagnosis not present

## 2022-08-30 DIAGNOSIS — I129 Hypertensive chronic kidney disease with stage 1 through stage 4 chronic kidney disease, or unspecified chronic kidney disease: Secondary | ICD-10-CM | POA: Insufficient documentation

## 2022-08-30 DIAGNOSIS — U071 COVID-19: Secondary | ICD-10-CM | POA: Insufficient documentation

## 2022-08-30 DIAGNOSIS — Z91040 Latex allergy status: Secondary | ICD-10-CM | POA: Insufficient documentation

## 2022-08-30 DIAGNOSIS — Z79899 Other long term (current) drug therapy: Secondary | ICD-10-CM | POA: Insufficient documentation

## 2022-08-30 DIAGNOSIS — Z7982 Long term (current) use of aspirin: Secondary | ICD-10-CM | POA: Insufficient documentation

## 2022-08-30 DIAGNOSIS — R079 Chest pain, unspecified: Secondary | ICD-10-CM | POA: Diagnosis present

## 2022-08-30 DIAGNOSIS — I2510 Atherosclerotic heart disease of native coronary artery without angina pectoris: Secondary | ICD-10-CM | POA: Insufficient documentation

## 2022-08-30 NOTE — ED Triage Notes (Signed)
[place] is from the [facility], was seen yesterday and dx with COVID. Pt told EMS she "not able tot get in the [facility], so I needed a place to go". "There too many drugs and drunks at the [facility]" Pt also reports still feels "itchy" from [place]. No fevers, VSS per EMS. Pt A&O x4. NAD noted. Pt denies SI/HI

## 2022-08-31 MED ORDER — ACETAMINOPHEN 500 MG PO TABS
1000.0000 mg | ORAL_TABLET | Freq: Once | ORAL | Status: AC
  Administered 2022-08-31: 1000 mg via ORAL
  Filled 2022-08-31: qty 2

## 2022-08-31 NOTE — ED Notes (Signed)
Pt refused to leave. Pt decided to walk out after hours of being discharged. Pt verbally abusive. DC paperwork handed to pt. Pt refused vital signs.

## 2022-08-31 NOTE — ED Provider Notes (Signed)
[facility] Provider Note  CSN: [id number] Arrival date & time: 08/30/22 2347  Chief Complaint(s) COVID  Place to stay ED Triage Notes Wynema Birch, RN (Registered Nurse)  Emergency Medicine  Date of Service: 08/30/2022 11:47 PM  Addendum [place], is from the [facility], was seen yesterday and dx with COVID. Pt told EMS she "not able tot get in the [facility], so I needed a place to go". "There too many drugs and drunks at the [facility]" Pt also reports still feels "icky" from [place]. No fevers, VSS per EMS. Pt A&O x4. NAD noted. Pt denies SI/HI     HPI Cassidy Larson is a 67 y.o. female  with a past medical history listed below who presents to the emergency department for chest pain related to cough after recent COVID diagnosis.  Patient was kicked out of the [facility]. She called EMS  because she needed a place to stay. No other physical complaints. Currently chest pain free.  HPI  Past Medical History Past Medical History:  Diagnosis Date   Anxiety    Blood transfusion without reported diagnosis    CAD in native artery residual post LAD stent, 100% RCA and 80% LCx.  03/30/2022   CKD (chronic kidney disease) stage 4, GFR 15-29 ml/min (HCC)    Cocaine abuse ([place])    Essential hypertension    HLD (hyperlipidemia) 03/30/2022   Lupus ([place])    On mechanically assisted ventilation ([place]) extubated 03/28/22  03/28/2022   Polysubstance abuse ([place])    Positive ANA (antinuclear antibody)    S/P angioplasty with stent to mLAD 03/27/22 03/30/2022   Patient Active Problem List   Diagnosis Date Noted   Protein-calorie malnutrition, severe 07/06/2022   Cough 07/06/2022   Nasal congestion 07/06/2022   CKD (chronic kidney disease), stage III ([place]) 07/05/2022   Elevated liver enzymes 07/03/2022   Norovirus 07/03/2022   Hypotension 04/19/2022   CAD in native artery residual post LAD stent, 100% RCA and 80% LCx.  03/30/2022   S/P angioplasty  with stent to mLAD 03/27/22 03/30/2022   HLD (hyperlipidemia) 03/30/2022   Malnutrition of moderate degree 03/30/2022   On mechanically assisted ventilation ([place]) extubated 03/28/22  03/28/2022   Acute respiratory failure with hypoxia (HCC) 03/28/2022   Cocaine abuse ([place]) 03/28/2022   Tobacco abuse 03/28/2022   Acute ST elevation myocardial infarction (STEMI) due to occlusion of left anterior descending (LAD) coronary artery (HCC)    Positive ANA (antinuclear antibody) 08/25/2017   CKD (chronic kidney disease) stage 4, GFR 15-29 ml/min ([place]) 08/18/2017   Hypertension 08/17/2017   Headache 08/17/2017   Substance abuse ([place]) 08/17/2017   Home Medication(s) Prior to Admission medications   Medication Sig Start Date End Date Taking? Authorizing Provider  aspirin EC 81 MG tablet Take 1 tablet (81 mg total) by mouth daily. Swallow whole. Patient not taking: Reported on 07/20/2022 07/09/22   Nani Gasser, MD  atorvastatin (LIPITOR) 40 MG tablet Take 1 tablet (40 mg total) by mouth daily. Patient not taking: Reported on 07/20/2022 07/08/22   Nani Gasser, MD  isosorbide mononitrate (IMDUR) 30 MG 24 hr tablet Take 0.5 tablets (15 mg total) by mouth daily. Patient not taking: Reported on 07/20/2022 07/08/22   Nani Gasser, MD  nitroGLYCERIN (NITROSTAT) 0.4 MG SL tablet Place 1 tablet (0.4 mg total) under the tongue every 5 (five) minutes as needed for chest pain. Patient not taking: Reported on 07/20/2022 07/08/22 07/08/23  Nani Gasser, MD  sertraline (ZOLOFT) 25 MG tablet Take 1 tablet (25 mg total) by mouth daily. Patient not taking: Reported on 07/20/2022 07/08/22 08/07/22  Nani Gasser, MD  ticagrelor (BRILINTA) 90 MG TABS tablet Take 1 tablet (90 mg total) by mouth 2 (two) times daily. Patient not taking: Reported on 07/20/2022 07/08/22   Nani Gasser, MD                                                                                                                                     Allergies Asa [aspirin], Latex, and Other  Review of Systems Review of Systems As noted in HPI  Physical Exam Vital Signs  I have reviewed the triage vital signs BP 121/66   Pulse 71   Temp 98.1 F (36.7 C)   Resp 16   Ht 5\' 2"  (1.575 m)   Wt 40.8 kg   SpO2 97%   BMI 16.46 kg/m   Physical Exam Vitals reviewed.  Constitutional:      General: She is not in acute distress.    Appearance: She is well-developed. She is not diaphoretic.  HENT:     Head: Normocephalic and atraumatic.     Nose: Nose normal.  Eyes:     General: No scleral icterus.       Right eye: No discharge.        Left eye: No discharge.     Conjunctiva/sclera: Conjunctivae normal.     Pupils: Pupils are equal, round, and reactive to light.  Cardiovascular:     Rate and Rhythm: Normal rate and regular rhythm.     Heart sounds: No murmur heard.    No friction rub. No gallop.  Pulmonary:     Effort: Pulmonary effort is normal. No respiratory distress.     Breath sounds: Normal breath sounds. No stridor. No rales.  Abdominal:     General: There is no distension.     Palpations: Abdomen is soft.     Tenderness: There is no abdominal tenderness.  Musculoskeletal:        General: No tenderness.     Cervical back: Normal range of motion and neck supple.  Skin:    General: Skin is warm and dry.     Findings: No erythema or rash.  Neurological:     Mental Status: She is alert and oriented to person, place, and time.     ED Results and Treatments Labs (all labs ordered are listed, but only abnormal results are displayed) Labs Reviewed - No data to display  EKG  EKG Interpretation  Date/Time:    Ventricular Rate:    PR Interval:    QRS Duration:   QT Interval:    QTC Calculation:   R Axis:     Text Interpretation:         Radiology No results found.  Medications  Ordered in ED Medications  acetaminophen (TYLENOL) tablet 1,000 mg (1,000 mg Oral Given 08/31/22 0228)                                                                                                                                     Procedures Procedures  (including critical care time)  Medical Decision Making / ED Course  Click here for ABCD2, HEART and other calculators  Medical Decision Making Risk OTC drugs.    Patient is mostly here for shelter. Complains of chest discomfort with cough. COVID + yesterday. No pneumonia on CXR yesterday. Lungs clear today. No need to repeat. Currently asymptomatic. Doubt cardiac etiology, PE, PTX, dissection.  Allowed to stay until the am.      Final Clinical Impression(s) / ED Diagnoses Final diagnoses:  COVID-19 virus infection   The patient appears reasonably screened and/or stabilized for discharge and I doubt any other medical condition or other [facility] requiring further screening, evaluation, or treatment in the ED at this time. I have discussed the findings, Dx and Tx plan with the patient/family who expressed understanding and agree(s) with the plan. Discharge instructions discussed at length. The patient/family was given strict return precautions who verbalized understanding of the instructions. No further questions at time of discharge.  Disposition: Discharge  Condition: Good  ED Discharge Orders     None        Follow Up: Kerin Perna, NP [address] [place] [address] [phone]  Call  to schedule an appointment for close follow up           This chart was dictated using voice recognition software.  Despite best efforts to proofread,  errors can occur which can change the documentation meaning.    Fatima Blank, MD 08/31/22 [phone]

## 2022-09-06 ENCOUNTER — Encounter (HOSPITAL_COMMUNITY): Payer: Self-pay | Admitting: Emergency Medicine

## 2022-09-06 ENCOUNTER — Emergency Department (HOSPITAL_COMMUNITY)
Admission: EM | Admit: 2022-09-06 | Discharge: 2022-09-06 | Disposition: A | Discharge status: Home / Self Care | Payer: Medicaid Other | Attending: Emergency Medicine | Admitting: Emergency Medicine

## 2022-09-06 ENCOUNTER — Other Ambulatory Visit: Payer: Self-pay

## 2022-09-06 ENCOUNTER — Other Ambulatory Visit (HOSPITAL_COMMUNITY): Payer: Self-pay

## 2022-09-06 DIAGNOSIS — F17200 Nicotine dependence, unspecified, uncomplicated: Secondary | ICD-10-CM | POA: Diagnosis not present

## 2022-09-06 DIAGNOSIS — M545 Low back pain, unspecified: Secondary | ICD-10-CM

## 2022-09-06 DIAGNOSIS — I10 Essential (primary) hypertension: Secondary | ICD-10-CM | POA: Insufficient documentation

## 2022-09-06 DIAGNOSIS — Z5900 Homelessness unspecified: Secondary | ICD-10-CM | POA: Insufficient documentation

## 2022-09-06 DIAGNOSIS — Z7982 Long term (current) use of aspirin: Secondary | ICD-10-CM | POA: Insufficient documentation

## 2022-09-06 DIAGNOSIS — I2510 Atherosclerotic heart disease of native coronary artery without angina pectoris: Secondary | ICD-10-CM | POA: Diagnosis not present

## 2022-09-06 DIAGNOSIS — Z91040 Latex allergy status: Secondary | ICD-10-CM | POA: Insufficient documentation

## 2022-09-06 DIAGNOSIS — Z79899 Other long term (current) drug therapy: Secondary | ICD-10-CM | POA: Diagnosis not present

## 2022-09-06 DIAGNOSIS — M791 Myalgia, unspecified site: Secondary | ICD-10-CM | POA: Diagnosis present

## 2022-09-06 DIAGNOSIS — U071 COVID-19: Secondary | ICD-10-CM | POA: Diagnosis not present

## 2022-09-06 DIAGNOSIS — R52 Pain, unspecified: Secondary | ICD-10-CM

## 2022-09-06 DIAGNOSIS — B349 Viral infection, unspecified: Secondary | ICD-10-CM

## 2022-09-06 MED ORDER — DEXAMETHASONE 4 MG PO TABS
6.0000 mg | ORAL_TABLET | Freq: Once | ORAL | Status: AC
  Administered 2022-09-06: 6 mg via ORAL
  Filled 2022-09-06: qty 1

## 2022-09-06 MED ORDER — OXYCODONE-ACETAMINOPHEN 5-325 MG PO TABS
2.0000 | ORAL_TABLET | Freq: Once | ORAL | Status: AC
  Administered 2022-09-06: 2 via ORAL
  Filled 2022-09-06: qty 2

## 2022-09-06 MED ORDER — CYCLOBENZAPRINE HCL 10 MG PO TABS
10.0000 mg | ORAL_TABLET | Freq: Once | ORAL | Status: AC
  Administered 2022-09-06: 10 mg via ORAL
  Filled 2022-09-06: qty 1

## 2022-09-06 MED ORDER — ALBUTEROL SULFATE HFA 108 (90 BASE) MCG/ACT IN AERS
2.0000 | INHALATION_SPRAY | Freq: Once | RESPIRATORY_TRACT | Status: AC
  Administered 2022-09-06: 2 via RESPIRATORY_TRACT
  Filled 2022-09-06: qty 6.7

## 2022-09-06 NOTE — ED Provider Notes (Signed)
[facility] Provider Note   CSN: [id number] Arrival date & time: 09/06/22  1808     History  Chief Complaint  Patient presents with   Covid Positive    Cassidy Larson is a 67 y.o. female with past medical history polysubstance abuse, hypertension, anxiety, anemia, CAD who presents to the ED complaining of generalized pain for the last 6 days.  On that day, patient was diagnosed with COVID-19.  She states that she is having subjective fever and chills but what is bothering her most is the pain especially in her back.  She has not had a fall or any trauma to her back.  She does endorse a history of chronic back pain.  She reports that she is currently homeless and thus is unable to afford any medications to treat her back pain outside of the hospital.  She denies abdominal pain, nausea, vomiting, flank pain, neck pain, chest pain, SOB, paresthesias, focal weakness, or other acute complaints today.       Home Medications Prior to Admission medications   Medication Sig Start Date End Date Taking? Authorizing Provider  aspirin EC 81 MG tablet Take 1 tablet (81 mg total) by mouth daily. Swallow whole. Patient not taking: Reported on 07/20/2022 07/09/22   Nani Gasser, MD  atorvastatin (LIPITOR) 40 MG tablet Take 1 tablet (40 mg total) by mouth daily. Patient not taking: Reported on 07/20/2022 07/08/22   Nani Gasser, MD  isosorbide mononitrate (IMDUR) 30 MG 24 hr tablet Take 0.5 tablets (15 mg total) by mouth daily. Patient not taking: Reported on 07/20/2022 07/08/22   Nani Gasser, MD  nitroGLYCERIN (NITROSTAT) 0.4 MG SL tablet Place 1 tablet (0.4 mg total) under the tongue every 5 (five) minutes as needed for chest pain. Patient not taking: Reported on 07/20/2022 07/08/22 07/08/23  Nani Gasser, MD  sertraline (ZOLOFT) 25 MG tablet Take 1 tablet (25 mg total) by mouth daily. Patient not taking: Reported on 07/20/2022 07/08/22 08/07/22   Nani Gasser, MD  ticagrelor (BRILINTA) 90 MG TABS tablet Take 1 tablet (90 mg total) by mouth 2 (two) times daily. Patient not taking: Reported on 07/20/2022 07/08/22   Nani Gasser, MD      Allergies    Asa [aspirin], Latex, and Other    Review of Systems   Review of Systems  All other systems reviewed and are negative.   Physical Exam Updated Vital Signs BP 101/72   Pulse 88   Temp 98.5 F (36.9 C) (Oral)   Resp 16   SpO2 100%  Physical Exam Vitals and nursing note reviewed.  Constitutional:      General: She is not in acute distress.    Appearance: Normal appearance. She is not toxic-appearing or diaphoretic.  HENT:     Head: Normocephalic and atraumatic.     Mouth/Throat:     Mouth: Mucous membranes are moist.     Pharynx: Oropharynx is clear.  Eyes:     General: No scleral icterus.    Extraocular Movements: Extraocular movements intact.     Conjunctiva/sclera: Conjunctivae normal.     Pupils: Pupils are equal, round, and reactive to light.  Neck:     Comments: No midline tenderness, stepoffs, or deformities Cardiovascular:     Rate and Rhythm: Normal rate and regular rhythm.     Heart sounds: No murmur heard. Pulmonary:     Effort: Pulmonary effort is normal. No respiratory distress.     Breath sounds: No  stridor. Wheezing (diffuse) present. No rhonchi or rales.  Abdominal:     General: Abdomen is flat. There is no distension.     Palpations: Abdomen is soft.     Tenderness: There is no abdominal tenderness. There is no right CVA tenderness, left CVA tenderness, guarding or rebound.  Musculoskeletal:     Cervical back: Normal range of motion and neck supple. No rigidity or tenderness.     Right lower leg: No edema.     Left lower leg: No edema.     Comments: No midline thoracic or lumbar spinal tenderness, stepoffs, or deformities  Skin:    General: Skin is warm and dry.     Capillary Refill: Capillary refill takes less than 2 seconds.   Neurological:     General: No focal deficit present.     Mental Status: She is alert and oriented to person, place, and time.  Psychiatric:        Behavior: Behavior normal.     ED Results / Procedures / Treatments   Labs (all labs ordered are listed, but only abnormal results are displayed) Labs Reviewed - No data to display  EKG None  Radiology No results found.  Procedures Procedures    Medications Ordered in ED Medications  albuterol (VENTOLIN HFA) 108 (90 Base) MCG/ACT inhaler 2 puff (has no administration in time range)  dexamethasone (DECADRON) tablet 6 mg (has no administration in time range)  cyclobenzaprine (FLEXERIL) tablet 10 mg (has no administration in time range)  oxyCODONE-acetaminophen (PERCOCET/ROXICET) 5-325 MG per tablet 2 tablet (has no administration in time range)    ED Course/ Medical Decision Making/ A&P                             Medical Decision Making  Medical Decision Making:   Cassidy Larson is a 67 y.o. female who presented to the ED today with body aches detailed above.    External chart has been reviewed including recent ED visit. Patient's presentation is complicated by their history of polysubstance abuse, housing instability.  Complete initial physical exam performed, notably the patient  was in no acute distress. She had no midline spinal tenderness, stepoffs, or deformities. She was neurologically intact. No meningismus. Diffuse wheezing on lung exam but no signs of respiratory distress and pt is a smoker. .    Reviewed and confirmed nursing documentation for past medical history, family history, social history.    Initial Assessment:   With the patient's presentation of body aches, most likely diagnosis is viral syndrome. The emergent differential diagnosis for back pain includes but is not limited to fracture, muscle strain, cauda equina, spinal stenosis. DDD, ankylosing spondylitis, acute ligamentous injury, disk herniation,  spondylolisthesis, Epidural compression syndrome, metastatic cancer, transverse myelitis, vertebral osteomyelitis, diskitis, kidney stone, pyelonephritis, AAA, Perforated ulcer, Retrocecal appendicitis, pancreatitis, bowel obstruction, retroperitoneal hemorrhage or mass, meningitis.  This is most consistent with an acute complicated illness  Initial Plan:  Medications for symptomatic treatment MDI for pt in ED and to take home Social resources Objective evaluation as reviewed   Final Assessment and Plan:   This is a 67 year old female who presents to the ED complaining of generalized bodyaches.  Patient diagnosed with COVID-19 6 days ago.  Patient currently experiencing homelessness and reports that her main concern today is her generalized bodyaches worse to the back but she is also still having subjective fever and chills.  She is not taking any  medication at home to treat her symptoms due to lack of financial resources.  On exam, patient has no midline spinal tenderness, step-offs, deformities, or overlying skin changes.  No meningismus.  She is neurologically intact with full strength to bilateral upper and lower extremities.  Normal sensation.  Abdominal exam is benign.  She does have some diffuse wheezing to her lung fields.  Remainder of leg exam is unremarkable and no signs of respiratory distress.  She is an active smoker.  Vital signs are stable with normal oxygen saturation and normal respirations. Pt denies trauma to back so suspect in setting of current COVID-19 illness pt's symptoms related to her viral syndrome. Discussed case with attending physician and will treat pt's symptoms symptomatically in ED today. No indications for emergent presentation of back pain. Pt given strict ED return precautions, all questions answered, and stable for discharge. Provided with housing, social services resources.    Clinical Impression:  1. COVID-19   2. Viral syndrome   3. Body aches   4. Acute  bilateral low back pain without sciatica      Discharge           Final Clinical Impression(s) / ED Diagnoses Final diagnoses:  Viral syndrome  Body aches  Acute bilateral low back pain without sciatica  COVID-19    Rx / DC Orders ED Discharge Orders     None         Suzzette Righter, PA-C 09/06/22 1927    Drenda Freeze, MD 09/06/22 2218

## 2022-09-06 NOTE — ED Triage Notes (Signed)
Patient was picked up behind a shell for back pain. She was at [facility] last week for Covid and has not had symptom improvement.    EMS vitals: 110/80 BP 80 HR

## 2022-09-06 NOTE — ED Notes (Signed)
Patient left prior to discharge documentation.

## 2022-09-06 NOTE — Discharge Instructions (Signed)
Thank you for letting us take care of you today.  We know you are positive for COVID-19 and suspect this is causing your body aches / back pain. Please continue to care for your symptoms using over the counter medications such as Tylenol, ibuprofen, massage, and a heating pad. Symptoms from COVID-19 usually last 7-10 days but may last up to 2 weeks. It is important to stay active to prevent further tightening up of your back muscles which can worsen your pain. It is also important to stay well hydrated to prevent cramping of your muscles.  If you continue to have symptoms or other concerns, please follow up with your PCP next week. For any new or worsening symptoms or injury to your back, please return to nearest emergency department for re-evaluation.

## 2022-09-09 ENCOUNTER — Other Ambulatory Visit: Payer: Self-pay

## 2022-09-09 ENCOUNTER — Emergency Department (HOSPITAL_COMMUNITY): Payer: Medicaid Other

## 2022-09-09 ENCOUNTER — Emergency Department (HOSPITAL_COMMUNITY)
Admission: EM | Admit: 2022-09-09 | Discharge: 2022-09-09 | Disposition: A | Discharge status: Home / Self Care | Payer: Medicaid Other | Source: Home / Self Care | Attending: Emergency Medicine | Admitting: Emergency Medicine

## 2022-09-09 ENCOUNTER — Emergency Department (HOSPITAL_COMMUNITY)
Admission: EM | Admit: 2022-09-09 | Discharge: 2022-09-09 | Disposition: A | Discharge status: Home / Self Care | Payer: Medicaid Other | Attending: Emergency Medicine | Admitting: Emergency Medicine

## 2022-09-09 DIAGNOSIS — M25561 Pain in right knee: Secondary | ICD-10-CM | POA: Insufficient documentation

## 2022-09-09 DIAGNOSIS — R001 Bradycardia, unspecified: Secondary | ICD-10-CM | POA: Diagnosis not present

## 2022-09-09 DIAGNOSIS — X31XXXA Exposure to excessive natural cold, initial encounter: Secondary | ICD-10-CM | POA: Insufficient documentation

## 2022-09-09 DIAGNOSIS — T68XXXA Hypothermia, initial encounter: Secondary | ICD-10-CM | POA: Insufficient documentation

## 2022-09-09 DIAGNOSIS — M25559 Pain in unspecified hip: Secondary | ICD-10-CM

## 2022-09-09 DIAGNOSIS — Z7982 Long term (current) use of aspirin: Secondary | ICD-10-CM | POA: Insufficient documentation

## 2022-09-09 DIAGNOSIS — Z5900 Homelessness unspecified: Secondary | ICD-10-CM | POA: Diagnosis not present

## 2022-09-09 DIAGNOSIS — Z91040 Latex allergy status: Secondary | ICD-10-CM | POA: Insufficient documentation

## 2022-09-09 DIAGNOSIS — M25551 Pain in right hip: Secondary | ICD-10-CM | POA: Insufficient documentation

## 2022-09-09 DIAGNOSIS — N189 Chronic kidney disease, unspecified: Secondary | ICD-10-CM | POA: Insufficient documentation

## 2022-09-09 MED ORDER — ACETAMINOPHEN 500 MG PO TABS
1000.0000 mg | ORAL_TABLET | Freq: Once | ORAL | Status: AC
  Administered 2022-09-09: 1000 mg via ORAL
  Filled 2022-09-09: qty 2

## 2022-09-09 NOTE — ED Provider Notes (Signed)
[facility] Provider Note   CSN: [id number] Arrival date & time: 09/09/22  1633     History {Add pertinent medical, surgical, social history, OB history to HPI:1} Chief Complaint  Patient presents with  . Diaper Issue    Cassidy Larson is a 67 y.o. female with PMH of chronic kidney disease, substance abuse, STEMI   HPI     Home Medications Prior to Admission medications   Medication Sig Start Date End Date Taking? Authorizing Provider  aspirin EC 81 MG tablet Take 1 tablet (81 mg total) by mouth daily. Swallow whole. Patient not taking: Reported on 07/20/2022 07/09/22   Nani Gasser, MD  atorvastatin (LIPITOR) 40 MG tablet Take 1 tablet (40 mg total) by mouth daily. Patient not taking: Reported on 07/20/2022 07/08/22   Nani Gasser, MD  isosorbide mononitrate (IMDUR) 30 MG 24 hr tablet Take 0.5 tablets (15 mg total) by mouth daily. Patient not taking: Reported on 07/20/2022 07/08/22   Nani Gasser, MD  nitroGLYCERIN (NITROSTAT) 0.4 MG SL tablet Place 1 tablet (0.4 mg total) under the tongue every 5 (five) minutes as needed for chest pain. Patient not taking: Reported on 07/20/2022 07/08/22 07/08/23  Nani Gasser, MD  sertraline (ZOLOFT) 25 MG tablet Take 1 tablet (25 mg total) by mouth daily. Patient not taking: Reported on 07/20/2022 07/08/22 08/07/22  Nani Gasser, MD  ticagrelor (BRILINTA) 90 MG TABS tablet Take 1 tablet (90 mg total) by mouth 2 (two) times daily. Patient not taking: Reported on 07/20/2022 07/08/22   Nani Gasser, MD      Allergies    Asa [aspirin], Latex, and Other    Review of Systems   Review of Systems  Physical Exam Updated Vital Signs There were no vitals taken for this visit. Physical Exam  ED Results / Procedures / Treatments   Labs (all labs ordered are listed, but only abnormal results are displayed) Labs Reviewed - No data to display  EKG None  Radiology DG Pelvis 1-2  Views  Result Date: 09/09/2022 CLINICAL DATA:  Recent fall, pain EXAM: PELVIS - 1-2 VIEW COMPARISON:  CT done on 07/03/2022 FINDINGS: Single AP supine radiograph of pelvis shows no displaced fracture or dislocation. Marked degenerative changes are noted in right hip with joint space narrowing, bony spurs and subcortical cysts. IMPRESSION: No recent displaced fracture is seen. Marked degenerative changes are noted in right hip. Electronically Signed   By: Elmer Picker M.D.   On: 09/09/2022 10:14    Procedures Procedures  {Document cardiac monitor, telemetry assessment procedure when appropriate:1}  Medications Ordered in ED Medications - No data to display  ED Course/ Medical Decision Making/ A&P   {   Click here for ABCD2, HEART and other calculatorsREFRESH Note before signing :1}                          Medical Decision Making  ***  {Document critical care time when appropriate:1} {Document review of labs and clinical decision tools ie heart score, Chads2Vasc2 etc:1}  {Document your independent review of radiology images, and any outside records:1} {Document your discussion with family members, caretakers, and with consultants:1} {Document social determinants of health affecting pt's care:1} {Document your decision making why or why not admission, treatments were needed:1} Final Clinical Impression(s) / ED Diagnoses Final diagnoses:  None    Rx / DC Orders ED Discharge Orders     None

## 2022-09-09 NOTE — ED Notes (Signed)
Attempted to call [facility] x 3 re: pt going back to facility-- no answer.

## 2022-09-09 NOTE — ED Provider Notes (Signed)
[facility] Provider Note   CSN: [id number] Arrival date & time: 09/09/22  [id number]     History Chief Complaint  Patient presents with   Hip Pain   Covid Positive    Cassidy Larson is a 67 y.o. female.  Patient with past history significant for chronic kidney disease, substance abuse, STEMI presents to the emergency department complaints of hip pain and recently tested positive for COVID-19.  Patient was brought in by EMS found outside of a [place] she currently is homeless and does not have any shelter that she can stay up.  She reports that she was trying to go into the [facility] but is unable to do given that she recently tested positive for COVID-19.  Also reports that she fell yesterday onto concrete but is unsure about this.  Reports that she is cold from being outside all night.   Hip Pain       Home Medications Prior to Admission medications   Medication Sig Start Date End Date Taking? Authorizing Provider  aspirin EC 81 MG tablet Take 1 tablet (81 mg total) by mouth daily. Swallow whole. Patient not taking: Reported on 07/20/2022 07/09/22   Nani Gasser, MD  atorvastatin (LIPITOR) 40 MG tablet Take 1 tablet (40 mg total) by mouth daily. Patient not taking: Reported on 07/20/2022 07/08/22   Nani Gasser, MD  isosorbide mononitrate (IMDUR) 30 MG 24 hr tablet Take 0.5 tablets (15 mg total) by mouth daily. Patient not taking: Reported on 07/20/2022 07/08/22   Nani Gasser, MD  nitroGLYCERIN (NITROSTAT) 0.4 MG SL tablet Place 1 tablet (0.4 mg total) under the tongue every 5 (five) minutes as needed for chest pain. Patient not taking: Reported on 07/20/2022 07/08/22 07/08/23  Nani Gasser, MD  sertraline (ZOLOFT) 25 MG tablet Take 1 tablet (25 mg total) by mouth daily. Patient not taking: Reported on 07/20/2022 07/08/22 08/07/22  Nani Gasser, MD  ticagrelor (BRILINTA) 90 MG TABS tablet Take 1 tablet (90 mg total) by mouth 2  (two) times daily. Patient not taking: Reported on 07/20/2022 07/08/22   Nani Gasser, MD      Allergies    Asa [aspirin], Latex, and Other    Review of Systems   Review of Systems  Respiratory:  Positive for cough.   Musculoskeletal:  Positive for back pain.  All other systems reviewed and are negative.   Physical Exam Updated Vital Signs BP 119/66   Pulse 78   Temp (!) 96.4 F (35.8 C) (Rectal)   Resp 16   Ht 5\' 2"  (1.575 m)   Wt 40.8 kg   SpO2 98%   BMI 16.45 kg/m  Physical Exam Vitals and nursing note reviewed.  Constitutional:      General: She is not in acute distress.    Appearance: She is well-developed.  HENT:     Head: Normocephalic and atraumatic.  Eyes:     Conjunctiva/sclera: Conjunctivae normal.  Cardiovascular:     Rate and Rhythm: Normal rate and regular rhythm.     Heart sounds: No murmur heard. Pulmonary:     Effort: Pulmonary effort is normal. No respiratory distress.     Breath sounds: Normal breath sounds.  Abdominal:     Palpations: Abdomen is soft.     Tenderness: There is no abdominal tenderness.  Musculoskeletal:        General: Tenderness present. No swelling, deformity or signs of injury.     Cervical back: Neck supple.  Skin:  General: Skin is dry.     Capillary Refill: Capillary refill takes less than 2 seconds.     Findings: No erythema or rash.  Neurological:     Mental Status: She is alert.  Psychiatric:        Mood and Affect: Mood normal.     ED Results / Procedures / Treatments   Labs (all labs ordered are listed, but only abnormal results are displayed) Labs Reviewed - No data to display  EKG EKG Interpretation  Date/Time:  Monday September 09 2022 10:22:12 EDT Ventricular Rate:  58 PR Interval:  94 QRS Duration: 76 QT Interval:  428 QTC Calculation: 420 R Axis:   81 Text Interpretation: Sinus bradycardia with short PR Otherwise normal ECG When compared with ECG of 29-Aug-2022 13:27, No significant change  since last tracing Confirmed by Aletta Edouard [phone]) on 09/09/2022 10:25:59 AM  Radiology DG Pelvis 1-2 Views  Result Date: 09/09/2022 CLINICAL DATA:  Recent fall, pain EXAM: PELVIS - 1-2 VIEW COMPARISON:  CT done on 07/03/2022 FINDINGS: Single AP supine radiograph of pelvis shows no displaced fracture or dislocation. Marked degenerative changes are noted in right hip with joint space narrowing, bony spurs and subcortical cysts. IMPRESSION: No recent displaced fracture is seen. Marked degenerative changes are noted in right hip. Electronically Signed   By: Elmer Picker M.D.   On: 09/09/2022 10:14    Procedures Procedures   Medications Ordered in ED Medications - No data to display  ED Course/ Medical Decision Making/ A&P                           Medical Decision Making Amount and/or Complexity of Data Reviewed Radiology: ordered.   This patient presents to the ED for concern of hip pain.  Differential diagnosis includes hip dislocation, femoral neck fracture, lumbar strain, trochanteric bursitis  Imaging Studies ordered:  I ordered imaging studies including x-ray of pelvis I independently visualized and interpreted imaging which showed no acute fractures or dislocations. Some degenerative changes indicating likely arthritis, especially in right hip I agree with the radiologist interpretation  Problem List / ED Course:  Patient presents emergency department complaints of hip pain and recently tested positive for COVID-19.  She reports that she may have had a fall recently causing her hip pain but does not recall exactly she did fall or what she may have fallen onto.  Patient is currently homeless and has been outside in the elements in the last day or so and reports she is now cold from this.  Patient has tried to be placed at [facility] but not currently allowed since she is positive for COVID-19.  Patient was worked up with imaging and EKG which were largely reassuring no signs of  any fractures dislocations or any arrhythmias noted.  Patient allowed to rewarm with warm blanket here in the emergency department and temperature was able to rise well without any significant complications.  Will plan on discharging patient to [facility] so that she is not out in the elements again risking her to return back to emergency department for hypothermia again.  Patient is somewhat resistant to this plan as she was previously denied from the [facility] due to testing positive for COVID-19 still try to figure out with TOC about any potential options for placement any other facilities or shelters for patient.  All questions answered prior to patient discharge.   Social Determinants of Health:  Patient currently homeless  Final Clinical Impression(s) / ED Diagnoses Final diagnoses:  Acute hip pain, unspecified laterality  Hypothermia, initial encounter  Homeless    Rx / DC Orders ED Discharge Orders     None         Vladimir Creeks 09/09/22 1441    Hayden Rasmussen, MD 09/09/22 [phone]

## 2022-09-09 NOTE — ED Triage Notes (Signed)
Pt discharged earlier and does not want to return to [facility] as she had arrangements to do.  Pt checked back in requesting diapers.  Pt provided with 3 adult diapers

## 2022-09-09 NOTE — ED Notes (Signed)
Patient verbalizes understanding of discharge instructions. Opportunity for questioning and answers were provided. Pt discharged from ED, pt taken to ED entrance via wheelchair.

## 2022-09-09 NOTE — Progress Notes (Signed)
TOC consulted to provide shelter resources. This Probation officer noted that shelter resources were already attached to pt's AVS. No further TOC needs at this time. TOC signing off.

## 2022-09-09 NOTE — Discharge Instructions (Addendum)
You had a knee xray that was negative for signs of fracture. You do have signs of degeneration like arthritis. You should take aleve twice a day to help with the pain and you can add in tylenol 1000 mg three times a day if you need additional pain relief.   Please return if the joint becomes red, warm, very large or if you are running fever.

## 2022-09-09 NOTE — Discharge Instructions (Addendum)
You were seen in the emergency department for hip pain recently tested positive for COVID-19.  Thankfully your vitals are largely reassuring but you did come in slightly hypothermic slightly because you are outside overnight.  We rewarmed here in the emergency department and you remained stable so believe that you are safe to discharge.  Please plan to follow-up with her primary care provider within the week for further evaluation if needed.  I would strongly recommend that you look into shelter options and resources as you do run the risk of becoming hypothermic again if you continue to spend the night outdoors.

## 2022-09-09 NOTE — ED Notes (Signed)
Pt transported to xray 

## 2022-09-09 NOTE — ED Notes (Addendum)
Per pt, she was put out by homeless shelter due to Covid. Tested positive for covid a week ago.

## 2022-09-09 NOTE — ED Notes (Signed)
Pt is being discharged. Pt is refusing to go back to [facility]. Per charge nurse York Cerise, social worker and PA, give bus pass to patient and discharge. Security called to escort pt out.

## 2022-09-09 NOTE — ED Triage Notes (Signed)
Pt BIB EMS from [facility], patient was laying in grass, police were going to take to [facility] but then patient started c/o pain. Said she fell yesterday on concrete but then changed the story to fell on grass, then can't remember if she fell or not. Aox4. C/o being cold from being outside all night. Has cough.

## 2022-09-12 ENCOUNTER — Emergency Department (HOSPITAL_COMMUNITY)
Admission: EM | Admit: 2022-09-12 | Discharge: 2022-09-12 | Disposition: A | Discharge status: Home / Self Care | Payer: Medicaid Other | Attending: Emergency Medicine | Admitting: Emergency Medicine

## 2022-09-12 ENCOUNTER — Ambulatory Visit (INDEPENDENT_AMBULATORY_CARE_PROVIDER_SITE_OTHER): Payer: Medicaid Other | Admitting: Primary Care

## 2022-09-12 ENCOUNTER — Other Ambulatory Visit: Payer: Self-pay

## 2022-09-12 DIAGNOSIS — W19XXXA Unspecified fall, initial encounter: Secondary | ICD-10-CM | POA: Insufficient documentation

## 2022-09-12 DIAGNOSIS — M79672 Pain in left foot: Secondary | ICD-10-CM | POA: Diagnosis not present

## 2022-09-12 DIAGNOSIS — Z91040 Latex allergy status: Secondary | ICD-10-CM | POA: Diagnosis not present

## 2022-09-12 DIAGNOSIS — Z7982 Long term (current) use of aspirin: Secondary | ICD-10-CM | POA: Insufficient documentation

## 2022-09-12 DIAGNOSIS — M79671 Pain in right foot: Secondary | ICD-10-CM | POA: Diagnosis not present

## 2022-09-12 NOTE — ED Triage Notes (Signed)
BIBA from [facility] for falls x2 today.   States her legs are weak and just give out.  She also states her legs are painful and it hurts to walk.

## 2022-09-12 NOTE — Discharge Instructions (Signed)
I have put 2 different providers on your paperwork.  1 is the podiatrist lives here locally.  He can take a look at your foot and tested formally for peripheral neuropathy or see if there is any other condition that he might help with.  Raechel Chute is a provider that can help people with limited resources help find care.
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
## 2022-09-21 ENCOUNTER — Other Ambulatory Visit: Payer: Self-pay

## 2022-09-21 ENCOUNTER — Encounter (HOSPITAL_COMMUNITY): Payer: Self-pay

## 2022-09-21 ENCOUNTER — Emergency Department (HOSPITAL_COMMUNITY): Payer: Medicaid Other

## 2022-09-21 ENCOUNTER — Emergency Department (HOSPITAL_COMMUNITY)
Admission: EM | Admit: 2022-09-21 | Discharge: 2022-09-22 | Disposition: A | Discharge status: Home / Self Care | Payer: Medicaid Other | Attending: Emergency Medicine | Admitting: Emergency Medicine

## 2022-09-21 DIAGNOSIS — Z91040 Latex allergy status: Secondary | ICD-10-CM | POA: Diagnosis not present

## 2022-09-21 DIAGNOSIS — M79672 Pain in left foot: Secondary | ICD-10-CM | POA: Insufficient documentation

## 2022-09-21 DIAGNOSIS — M79671 Pain in right foot: Secondary | ICD-10-CM | POA: Diagnosis not present

## 2022-09-21 DIAGNOSIS — Z5900 Homelessness unspecified: Secondary | ICD-10-CM | POA: Insufficient documentation

## 2022-09-21 HISTORY — DX: Heart failure, unspecified: I50.9

## 2022-09-21 MED ORDER — ACETAMINOPHEN 500 MG PO TABS
1000.0000 mg | ORAL_TABLET | Freq: Once | ORAL | Status: AC
  Administered 2022-09-21: 1000 mg via ORAL
  Filled 2022-09-21: qty 2

## 2022-09-21 NOTE — Discharge Instructions (Addendum)
You were seen in the emergency department for bilateral foot swelling. Your xrays appear normal and I believe that this pain is likely from overuse. I would advise you follow up with podiatry with Dr. Logan Bores as previously advised. If you feel that your symptoms are worsening, please return to the emergency department for further evaluation.

## 2022-09-21 NOTE — ED Triage Notes (Signed)
BIBA from [facility] for swelling and pain in her lower legs and feet, states she hasn't had her medications in about 2 months.
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
## 2022-10-20 ENCOUNTER — Emergency Department (HOSPITAL_COMMUNITY)
Admission: EM | Admit: 2022-10-20 | Discharge: 2022-10-21 | Disposition: A | Discharge status: Home / Self Care | Payer: Medicaid Other | Attending: Emergency Medicine | Admitting: Emergency Medicine

## 2022-10-20 ENCOUNTER — Encounter (HOSPITAL_COMMUNITY): Payer: Self-pay | Admitting: Emergency Medicine

## 2022-10-20 ENCOUNTER — Other Ambulatory Visit: Payer: Self-pay

## 2022-10-20 ENCOUNTER — Emergency Department (HOSPITAL_COMMUNITY): Payer: Medicaid Other

## 2022-10-20 DIAGNOSIS — R0789 Other chest pain: Secondary | ICD-10-CM | POA: Diagnosis not present

## 2022-10-20 DIAGNOSIS — Z91040 Latex allergy status: Secondary | ICD-10-CM | POA: Insufficient documentation

## 2022-10-20 DIAGNOSIS — Z7952 Long term (current) use of systemic steroids: Secondary | ICD-10-CM | POA: Insufficient documentation

## 2022-10-20 DIAGNOSIS — F17200 Nicotine dependence, unspecified, uncomplicated: Secondary | ICD-10-CM | POA: Diagnosis not present

## 2022-10-20 DIAGNOSIS — Z20822 Contact with and (suspected) exposure to covid-19: Secondary | ICD-10-CM | POA: Diagnosis not present

## 2022-10-20 DIAGNOSIS — J449 Chronic obstructive pulmonary disease, unspecified: Secondary | ICD-10-CM | POA: Diagnosis not present

## 2022-10-20 DIAGNOSIS — R0602 Shortness of breath: Secondary | ICD-10-CM | POA: Diagnosis present

## 2022-10-20 DIAGNOSIS — R079 Chest pain, unspecified: Secondary | ICD-10-CM

## 2022-10-20 LAB — BASIC METABOLIC PANEL
Anion gap: 10 (ref 5–15)
BUN: 19 mg/dL (ref 8–23)
CO2: 20 mmol/L — ABNORMAL LOW (ref 22–32)
Calcium: 8.7 mg/dL — ABNORMAL LOW (ref 8.9–10.3)
Chloride: 106 mmol/L (ref 98–111)
Creatinine, Ser: 1.24 mg/dL — ABNORMAL HIGH (ref 0.44–1.00)
GFR, Estimated: 48 mL/min — ABNORMAL LOW (ref >60)
Glucose, Bld: 127 mg/dL — ABNORMAL HIGH (ref 70–99)
Potassium: 4.5 mmol/L (ref 3.5–5.1)
Sodium: 136 mmol/L (ref 135–145)

## 2022-10-20 LAB — CBC WITH DIFFERENTIAL/PLATELET
Abs Immature Granulocytes: 0.02 10*3/uL (ref 0.00–0.07)
Basophils Absolute: 0 10*3/uL (ref 0.0–0.1)
Basophils Relative: 0 %
Eosinophils Absolute: 0.2 10*3/uL (ref 0.0–0.5)
Eosinophils Relative: 2 %
HCT: 38.2 % (ref 36.0–46.0)
Hemoglobin: 11.6 g/dL — ABNORMAL LOW (ref 12.0–15.0)
Immature Granulocytes: 0 %
Lymphocytes Relative: 8 %
Lymphs Abs: 0.8 10*3/uL (ref 0.7–4.0)
MCH: 29.1 pg (ref 26.0–34.0)
MCHC: 30.4 g/dL (ref 30.0–36.0)
MCV: 96 fL (ref 80.0–100.0)
Monocytes Absolute: 0.6 10*3/uL (ref 0.1–1.0)
Monocytes Relative: 6 %
Neutro Abs: 7.9 10*3/uL — ABNORMAL HIGH (ref 1.7–7.7)
Neutrophils Relative %: 84 %
Platelets: 317 10*3/uL (ref 150–400)
RBC: 3.98 MIL/uL (ref 3.87–5.11)
RDW: 16.5 % — ABNORMAL HIGH (ref 11.5–15.5)
WBC: 9.4 10*3/uL (ref 4.0–10.5)
nRBC: 0 % (ref 0.0–0.2)

## 2022-10-20 LAB — TROPONIN I (HIGH SENSITIVITY)
Troponin I (High Sensitivity): 17 ng/L (ref <18)
Troponin I (High Sensitivity): 14 ng/L (ref <18)

## 2022-10-20 LAB — RESP PANEL BY RT-PCR (RSV, FLU A&B, COVID)  RVPGX2
Influenza A by PCR: NEGATIVE
Influenza B by PCR: NEGATIVE
Resp Syncytial Virus by PCR: NEGATIVE
SARS Coronavirus 2 by RT PCR: NEGATIVE

## 2022-10-20 LAB — BRAIN NATRIURETIC PEPTIDE: B Natriuretic Peptide: 248 pg/mL — ABNORMAL HIGH (ref 0.0–100.0)

## 2022-10-20 MED ORDER — ISOSORBIDE MONONITRATE ER 30 MG PO TB24: 30.0000 mg | ORAL_TABLET | Freq: Every day | ORAL | 1 refills | Status: DC

## 2022-10-20 MED ORDER — TICAGRELOR 90 MG PO TABS: 90.0000 mg | ORAL_TABLET | Freq: Two times a day (BID) | ORAL | 1 refills | Status: DC

## 2022-10-20 MED ORDER — PREDNISONE 20 MG PO TABS: ORAL_TABLET | ORAL | 0 refills | Status: DC

## 2022-10-20 MED ORDER — IPRATROPIUM-ALBUTEROL 0.5-2.5 (3) MG/3ML IN SOLN
3.0000 mL | Freq: Once | RESPIRATORY_TRACT | Status: AC
  Administered 2022-10-20: 3 mL via RESPIRATORY_TRACT
  Filled 2022-10-20: qty 3

## 2022-10-20 MED ORDER — METHYLPREDNISOLONE SODIUM SUCC 125 MG IJ SOLR
125.0000 mg | Freq: Once | INTRAMUSCULAR | Status: AC
  Administered 2022-10-20: 125 mg via INTRAVENOUS
  Filled 2022-10-20: qty 2

## 2022-10-20 MED ORDER — FUROSEMIDE 10 MG/ML IJ SOLN
20.0000 mg | Freq: Once | INTRAMUSCULAR | Status: AC
  Administered 2022-10-20: 20 mg via INTRAVENOUS
  Filled 2022-10-20: qty 2

## 2022-10-20 MED ORDER — ALBUTEROL SULFATE HFA 108 (90 BASE) MCG/ACT IN AERS
2.0000 | INHALATION_SPRAY | Freq: Once | RESPIRATORY_TRACT | Status: AC
  Administered 2022-10-20: 2 via RESPIRATORY_TRACT
  Filled 2022-10-20: qty 6.7

## 2022-10-20 NOTE — ED Triage Notes (Signed)
Pt bib [facility] from [facility] for centralized chest pain that radiates to back. Pt describes the pain as stabbing and states it started tonight. Endorses shob and productive cough.
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
## 2022-10-20 NOTE — ED Notes (Signed)
Pt currently sleeping

## 2023-03-20 ENCOUNTER — Encounter (HOSPITAL_COMMUNITY): Payer: Self-pay | Admitting: *Deleted

## 2023-03-20 ENCOUNTER — Emergency Department (HOSPITAL_COMMUNITY)
Admission: EM | Admit: 2023-03-20 | Discharge: 2023-03-21 | Disposition: A | Discharge status: Home / Self Care | Payer: Medicaid Other

## 2023-03-20 ENCOUNTER — Other Ambulatory Visit: Payer: Self-pay

## 2023-03-20 ENCOUNTER — Emergency Department (HOSPITAL_COMMUNITY): Payer: Medicaid Other

## 2023-03-20 DIAGNOSIS — Z609 Problem related to social environment, unspecified: Secondary | ICD-10-CM | POA: Insufficient documentation

## 2023-03-20 DIAGNOSIS — I2510 Atherosclerotic heart disease of native coronary artery without angina pectoris: Secondary | ICD-10-CM | POA: Insufficient documentation

## 2023-03-20 DIAGNOSIS — I509 Heart failure, unspecified: Secondary | ICD-10-CM | POA: Insufficient documentation

## 2023-03-20 DIAGNOSIS — Z1152 Encounter for screening for COVID-19: Secondary | ICD-10-CM | POA: Diagnosis not present

## 2023-03-20 DIAGNOSIS — Z91040 Latex allergy status: Secondary | ICD-10-CM | POA: Diagnosis not present

## 2023-03-20 DIAGNOSIS — I110 Hypertensive heart disease with heart failure: Secondary | ICD-10-CM | POA: Insufficient documentation

## 2023-03-20 DIAGNOSIS — J40 Bronchitis, not specified as acute or chronic: Secondary | ICD-10-CM | POA: Insufficient documentation

## 2023-03-20 DIAGNOSIS — R079 Chest pain, unspecified: Secondary | ICD-10-CM | POA: Diagnosis present

## 2023-03-20 DIAGNOSIS — I252 Old myocardial infarction: Secondary | ICD-10-CM | POA: Insufficient documentation

## 2023-03-20 DIAGNOSIS — Z91148 Patient's other noncompliance with medication regimen for other reason: Secondary | ICD-10-CM | POA: Insufficient documentation

## 2023-03-20 DIAGNOSIS — R413 Other amnesia: Secondary | ICD-10-CM | POA: Insufficient documentation

## 2023-03-20 LAB — BASIC METABOLIC PANEL
Anion gap: 10 (ref 5–15)
BUN: 20 mg/dL (ref 8–23)
CO2: 25 mmol/L (ref 22–32)
Calcium: 8.7 mg/dL — ABNORMAL LOW (ref 8.9–10.3)
Chloride: 105 mmol/L (ref 98–111)
Creatinine, Ser: 1.08 mg/dL — ABNORMAL HIGH (ref 0.44–1.00)
GFR, Estimated: 57 mL/min — ABNORMAL LOW (ref >60)
Glucose, Bld: 75 mg/dL (ref 70–99)
Potassium: 4.2 mmol/L (ref 3.5–5.1)
Sodium: 140 mmol/L (ref 135–145)

## 2023-03-20 LAB — CBC
HCT: 33.4 % — ABNORMAL LOW (ref 36.0–46.0)
Hemoglobin: 10.9 g/dL — ABNORMAL LOW (ref 12.0–15.0)
MCH: 30.1 pg (ref 26.0–34.0)
MCHC: 32.6 g/dL (ref 30.0–36.0)
MCV: 92.3 fL (ref 80.0–100.0)
Platelets: 210 10*3/uL (ref 150–400)
RBC: 3.62 MIL/uL — ABNORMAL LOW (ref 3.87–5.11)
RDW: 16.9 % — ABNORMAL HIGH (ref 11.5–15.5)
WBC: 4.2 10*3/uL (ref 4.0–10.5)
nRBC: 0 % (ref 0.0–0.2)

## 2023-03-20 LAB — TROPONIN I (HIGH SENSITIVITY): Troponin I (High Sensitivity): 14 ng/L (ref <18)
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
## 2023-03-20 NOTE — ED Notes (Signed)
Pt is ambulatory to restroom w/ x1 standby. Urine sample obtained and placed in hall B slot along with pt labels. Pt assisted back into bed w/out incident. Warm blanket provided.

## 2023-03-20 NOTE — ED Provider Notes (Signed)
[place] EMERGENCY DEPARTMENT AT [facility] Provider Note   CSN: [id number] Arrival date & time: 03/20/23  1519     History  Chief Complaint  Patient presents with   Hypertension   Cold Like symptoms    Cassidy Larson is a 67 y.o. female.  67 year old female with past medical history of cocaine abuse, lupus, hypertension, and CHF presenting to the emergency department today with chest pain.  The patient states that this has been going on now for the past few days.  She reports that she ran out of "all of her medications" and has not been taking any of these.  She states that the pain is in the center of her chest.  She states that she does have a cough with this.  This has been nonproductive.  She denies any hemoptysis.  Denies any leg swelling.   Hypertension Associated symptoms include chest pain.       Home Medications Prior to Admission medications   Medication Sig Start Date End Date Taking? Authorizing Provider  benzonatate (TESSALON) 100 MG capsule Take 1 capsule (100 mg total) by mouth every 8 (eight) hours. 03/21/23  Yes Durwin Glaze, MD  isosorbide mononitrate (IMDUR) 30 MG 24 hr tablet Take 1 tablet (30 mg total) by mouth daily. 03/21/23   Durwin Glaze, MD  predniSONE (DELTASONE) 20 MG tablet 2 tabs po daily x 4 days 10/20/22   Mesner, Barbara Cower, MD  ticagrelor (BRILINTA) 90 MG TABS tablet Take 1 tablet (90 mg total) by mouth 2 (two) times daily. 03/21/23   Durwin Glaze, MD      Allergies    Asa [aspirin], Latex, and Other    Review of Systems   Review of Systems  Respiratory:  Positive for cough and chest tightness.   Cardiovascular:  Positive for chest pain.  All other systems reviewed and are negative.   Physical Exam Updated Vital Signs BP (!) 146/86 (BP Location: Right Arm)   Pulse 68   Temp 98.3 F (36.8 C) (Oral)   Resp 16   SpO2 98%  Physical Exam Vitals and nursing note reviewed.   Gen: NAD Eyes: PERRL, EOMI HEENT: no  oropharyngeal swelling Neck: trachea midline Resp: clear to auscultation bilaterally Card: RRR, no murmurs, rubs, or gallops Abd: nontender, nondistended Extremities: no calf tenderness, no edema Vascular: 2+ radial pulses bilaterally, 2+ DP pulses bilaterally Skin: no rashes Psyc: acting appropriately   ED Results / Procedures / Treatments   Labs (all labs ordered are listed, but only abnormal results are displayed) Labs Reviewed  BASIC METABOLIC PANEL - Abnormal; Notable for the following components:      Result Value   Creatinine, Ser 1.08 (*)    Calcium 8.7 (*)    GFR, Estimated 57 (*)    All other components within normal limits  CBC - Abnormal; Notable for the following components:   RBC 3.62 (*)    Hemoglobin 10.9 (*)    HCT 33.4 (*)    RDW 16.9 (*)    All other components within normal limits  RESP PANEL BY RT-PCR (RSV, FLU A&B, COVID)  RVPGX2  TROPONIN I (HIGH SENSITIVITY)  TROPONIN I (HIGH SENSITIVITY)    EKG EKG Interpretation Date/Time:  Thursday March 20 2023 23:55:43 EDT Ventricular Rate:  61 PR Interval:  108 QRS Duration:  76 QT Interval:  422 QTC Calculation: 424 R Axis:   78  Text Interpretation: Sinus rhythm with short PR Otherwise normal ECG When  compared with ECG of 20-Oct-2022 04:32, PREVIOUS ECG IS PRESENT Confirmed by Beckey Downing [id number] on 03/21/2023 12:14:19 AM  Radiology DG Chest Port 1 View  Result Date: 03/20/2023 CLINICAL DATA:  Cough and cold symptoms.  Elevated blood pressure. EXAM: PORTABLE CHEST 1 VIEW COMPARISON:  10/20/2022 FINDINGS: Cardiac enlargement. Lungs are clear. No pleural effusions. No pneumothorax. Mediastinal contours appear intact. Degenerative changes in the spine and shoulders. IMPRESSION: Cardiac enlargement.  No evidence of active pulmonary disease. Electronically Signed   By: Burman Nieves M.D.   On: 03/20/2023 21:45    Procedures Procedures    Medications Ordered in ED Medications - No data to  display  ED Course/ Medical Decision Making/ A&P                                 Medical Decision Making 67 year old female with past medical history of cocaine abuse, coronary artery disease, and hypertension presenting to the emergency department today with chest pain and cough.  This been going now for the past few weeks.  I will further evaluate patient here with basic labs as well as an EKG, chest x-ray, and troponin for further evaluation for ACS, pulmonary edema, pulmonary infiltrates, pneumothorax.  Based on description number symptoms and duration along with her reassuring vital signs suspicion for pulmonary embolism or aortic dissection is low at this time.  I will reevaluate for ultimate disposition.  The patient's EKG interpreted by me shows sinus rhythm with normal axis, normal intervals, no significant ST-T changes.  Initial labs are reassuring.  Troponin and COVID testing are pending at the time of signout.  Plan is for discharge if these are unremarkable.  Amount and/or Complexity of Data Reviewed Labs: ordered. Radiology: ordered.  Risk Prescription drug management.           Final Clinical Impression(s) / ED Diagnoses Final diagnoses:  Bronchitis    Rx / DC Orders ED Discharge Orders          Ordered    isosorbide mononitrate (IMDUR) 30 MG 24 hr tablet  Daily        03/21/23 0007    ticagrelor (BRILINTA) 90 MG TABS tablet  2 times daily        03/21/23 0007    benzonatate (TESSALON) 100 MG capsule  Every 8 hours        03/21/23 0007              Durwin Glaze, MD 03/21/23 [phone]

## 2023-03-21 LAB — RESP PANEL BY RT-PCR (RSV, FLU A&B, COVID)  RVPGX2
Influenza A by PCR: NEGATIVE
Influenza B by PCR: NEGATIVE
Resp Syncytial Virus by PCR: NEGATIVE
SARS Coronavirus 2 by RT PCR: NEGATIVE

## 2023-03-21 LAB — TROPONIN I (HIGH SENSITIVITY): Troponin I (High Sensitivity): 14 ng/L (ref <18)

## 2023-03-21 MED ORDER — TICAGRELOR 90 MG PO TABS: 90.0000 mg | ORAL_TABLET | Freq: Two times a day (BID) | ORAL | 1 refills | Status: DC

## 2023-03-21 MED ORDER — ISOSORBIDE MONONITRATE ER 30 MG PO TB24: 30.0000 mg | ORAL_TABLET | Freq: Every day | ORAL | 1 refills | Status: DC

## 2023-03-21 MED ORDER — BENZONATATE 100 MG PO CAPS: 100.0000 mg | ORAL_CAPSULE | Freq: Three times a day (TID) | ORAL | 0 refills | Status: DC

## 2023-03-21 NOTE — ED Notes (Signed)
20g PIV removed from pt's right forearm.

## 2023-03-21 NOTE — ED Provider Notes (Signed)
Patient signed out pending repeat troponin and COVID/influenza testing.  Repeat troponin is flat.  COVID and influenza testing are negative.  Patient presentation consistent with bronchitis.  Patient was discharged with medication sent by prior provider.   Shon Baton, MD 03/21/23 [phone]

## 2023-03-21 NOTE — Discharge Instructions (Signed)
Your workup today was reassuring.  Please follow-up with your doctor and return to the ER for worsening symptoms.

## 2023-04-22 ENCOUNTER — Ambulatory Visit (INDEPENDENT_AMBULATORY_CARE_PROVIDER_SITE_OTHER): Payer: Self-pay

## 2023-04-22 NOTE — Telephone Encounter (Signed)
Haven't seen pt in 3 years. Pt has a new pcp  Will not be able to address
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
## 2023-05-20 ENCOUNTER — Other Ambulatory Visit: Payer: Self-pay | Admitting: Physician Assistant

## 2023-05-20 DIAGNOSIS — Z122 Encounter for screening for malignant neoplasm of respiratory organs: Secondary | ICD-10-CM

## 2023-05-20 DIAGNOSIS — Z720 Tobacco use: Secondary | ICD-10-CM

## 2023-05-21 DIAGNOSIS — R7303 Prediabetes: Secondary | ICD-10-CM | POA: Insufficient documentation

## 2023-05-21 DIAGNOSIS — E0590 Thyrotoxicosis, unspecified without thyrotoxic crisis or storm: Secondary | ICD-10-CM | POA: Insufficient documentation

## 2023-05-22 ENCOUNTER — Other Ambulatory Visit: Payer: Self-pay | Admitting: Physician Assistant

## 2023-05-22 ENCOUNTER — Encounter: Payer: Self-pay | Admitting: Physician Assistant

## 2023-05-22 DIAGNOSIS — Z13820 Encounter for screening for osteoporosis: Secondary | ICD-10-CM

## 2023-06-20 ENCOUNTER — Encounter (HOSPITAL_COMMUNITY): Payer: Self-pay

## 2023-06-20 ENCOUNTER — Other Ambulatory Visit: Payer: Self-pay

## 2023-06-20 ENCOUNTER — Emergency Department (HOSPITAL_COMMUNITY): Payer: Medicaid Other

## 2023-06-20 ENCOUNTER — Emergency Department (HOSPITAL_COMMUNITY)
Admission: EM | Admit: 2023-06-20 | Discharge: 2023-06-21 | Disposition: A | Discharge status: Home / Self Care | Payer: Medicaid Other | Attending: Emergency Medicine | Admitting: Emergency Medicine

## 2023-06-20 DIAGNOSIS — M542 Cervicalgia: Secondary | ICD-10-CM | POA: Insufficient documentation

## 2023-06-20 DIAGNOSIS — Z79899 Other long term (current) drug therapy: Secondary | ICD-10-CM | POA: Insufficient documentation

## 2023-06-20 DIAGNOSIS — I509 Heart failure, unspecified: Secondary | ICD-10-CM | POA: Insufficient documentation

## 2023-06-20 DIAGNOSIS — N189 Chronic kidney disease, unspecified: Secondary | ICD-10-CM | POA: Insufficient documentation

## 2023-06-20 DIAGNOSIS — I2510 Atherosclerotic heart disease of native coronary artery without angina pectoris: Secondary | ICD-10-CM | POA: Diagnosis not present

## 2023-06-20 DIAGNOSIS — I130 Hypertensive heart and chronic kidney disease with heart failure and stage 1 through stage 4 chronic kidney disease, or unspecified chronic kidney disease: Secondary | ICD-10-CM | POA: Insufficient documentation

## 2023-06-20 DIAGNOSIS — Z91040 Latex allergy status: Secondary | ICD-10-CM | POA: Diagnosis not present

## 2023-06-20 DIAGNOSIS — S0083XA Contusion of other part of head, initial encounter: Secondary | ICD-10-CM | POA: Insufficient documentation

## 2023-06-20 DIAGNOSIS — W19XXXA Unspecified fall, initial encounter: Secondary | ICD-10-CM

## 2023-06-20 LAB — CBC
HCT: 35.8 % — ABNORMAL LOW (ref 36.0–46.0)
Hemoglobin: 11.4 g/dL — ABNORMAL LOW (ref 12.0–15.0)
MCH: 29.3 pg (ref 26.0–34.0)
MCHC: 31.8 g/dL (ref 30.0–36.0)
MCV: 92 fL (ref 80.0–100.0)
Platelets: 286 10*3/uL (ref 150–400)
RBC: 3.89 MIL/uL (ref 3.87–5.11)
RDW: 15.2 % (ref 11.5–15.5)
WBC: 6.8 10*3/uL (ref 4.0–10.5)
nRBC: 0 % (ref 0.0–0.2)

## 2023-06-20 LAB — PROTIME-INR
INR: 1.1 (ref 0.8–1.2)
Prothrombin Time: 14.6 s (ref 11.4–15.2)

## 2023-06-20 LAB — URINALYSIS, ROUTINE W REFLEX MICROSCOPIC
Bilirubin Urine: NEGATIVE
Glucose, UA: NEGATIVE mg/dL
Hgb urine dipstick: NEGATIVE
Ketones, ur: NEGATIVE mg/dL
Leukocytes,Ua: NEGATIVE
Nitrite: NEGATIVE
Protein, ur: NEGATIVE mg/dL
Specific Gravity, Urine: 1.045 — ABNORMAL HIGH (ref 1.005–1.030)
pH: 5 (ref 5.0–8.0)

## 2023-06-20 LAB — COMPREHENSIVE METABOLIC PANEL
ALT: 19 U/L (ref 0–44)
AST: 25 U/L (ref 15–41)
Albumin: 3.3 g/dL — ABNORMAL LOW (ref 3.5–5.0)
Alkaline Phosphatase: 67 U/L (ref 38–126)
Anion gap: 9 (ref 5–15)
BUN: 28 mg/dL — ABNORMAL HIGH (ref 8–23)
CO2: 21 mmol/L — ABNORMAL LOW (ref 22–32)
Calcium: 9 mg/dL (ref 8.9–10.3)
Chloride: 108 mmol/L (ref 98–111)
Creatinine, Ser: 1.59 mg/dL — ABNORMAL HIGH (ref 0.44–1.00)
GFR, Estimated: 36 mL/min — ABNORMAL LOW (ref >60)
Glucose, Bld: 132 mg/dL — ABNORMAL HIGH (ref 70–99)
Potassium: 4.4 mmol/L (ref 3.5–5.1)
Sodium: 138 mmol/L (ref 135–145)
Total Bilirubin: 0.4 mg/dL (ref 0.0–1.2)
Total Protein: 8.6 g/dL — ABNORMAL HIGH (ref 6.5–8.1)

## 2023-06-20 LAB — ETHANOL: Alcohol, Ethyl (B): <10 mg/dL (ref <10)

## 2023-06-20 LAB — SAMPLE TO BLOOD BANK

## 2023-06-20 LAB — I-STAT CG4 LACTIC ACID, ED: Lactic Acid, Venous: 0.8 mmol/L (ref 0.5–1.9)

## 2023-06-20 MED ORDER — LACTATED RINGERS IV BOLUS
500.0000 mL | Freq: Once | INTRAVENOUS | Status: AC
  Administered 2023-06-20: 500 mL via INTRAVENOUS

## 2023-06-20 MED ORDER — IOHEXOL 350 MG/ML SOLN
75.0000 mL | Freq: Once | INTRAVENOUS | Status: AC | PRN
  Administered 2023-06-20: 75 mL via INTRAVENOUS

## 2023-06-20 MED ORDER — MORPHINE SULFATE (PF) 4 MG/ML IV SOLN
4.0000 mg | Freq: Once | INTRAVENOUS | Status: AC
  Administered 2023-06-20: 4 mg via INTRAVENOUS
  Filled 2023-06-20: qty 1
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
## 2023-06-20 NOTE — Discharge Instructions (Signed)
 You were seen the emergency ferment after fall The CAT scans and x-rays do not show any traumatic findings You remained stable Appropriate for discharge back to facility Return to the emergency department for repeated falls or any other concerns Follow-up with your primary care doctor

## 2023-06-20 NOTE — Progress Notes (Addendum)
 Patient lives at [facility] per her address.  CSW spoke to Delayne Roux, owner of [facility] who states patient can return to the home. Delayne states there are no transportation options available right now and requests patient be sent home via cab - address on chart is correct. Delayne states she is at home ready to receive the patient.  CSW informed RN of information.  Niels Portugal, MSW, LCSW Transitions of Care  Clinical Social Worker II [phone]
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
## 2023-06-20 NOTE — ED Notes (Signed)
 Attempted calling patient's group home numerous times with no answer. Will continue to attempt this.
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
## 2023-06-20 NOTE — ED Notes (Signed)
 X-ray at bedside

## 2023-06-21 NOTE — ED Notes (Signed)
 Called number in chart [phone] no answer message left.

## 2023-06-21 NOTE — ED Notes (Signed)
 Spoke to group home Production designer, theatre/television/film and patient back to facility by cab

## 2023-06-21 NOTE — ED Notes (Signed)
Patient given sandwich and ginger ale 

## 2023-08-01 LAB — LAB REPORT - SCANNED
Albumin, Urine POC: 144.4
Calcium: 9.2
Creatinine, POC: 170.3 mg/dL
EGFR: 46
Microalb Creat Ratio: 85
PTH: 27

## 2023-09-04 ENCOUNTER — Encounter: Payer: Self-pay | Admitting: Neurology

## 2023-09-04 ENCOUNTER — Ambulatory Visit: Payer: Medicaid Other | Admitting: Neurology

## 2023-12-18 ENCOUNTER — Other Ambulatory Visit: Payer: Medicaid Other

## 2024-05-15 ENCOUNTER — Inpatient Hospital Stay (HOSPITAL_COMMUNITY)
Admission: EM | Admit: 2024-05-15 | Discharge: 2024-05-20 | DRG: 871 | Disposition: A | Source: Skilled Nursing Facility | Attending: Hospitalist | Admitting: Hospitalist

## 2024-05-15 ENCOUNTER — Emergency Department (HOSPITAL_COMMUNITY)

## 2024-05-15 ENCOUNTER — Encounter (HOSPITAL_COMMUNITY): Payer: Self-pay

## 2024-05-15 ENCOUNTER — Other Ambulatory Visit: Payer: Self-pay

## 2024-05-15 ENCOUNTER — Inpatient Hospital Stay (HOSPITAL_COMMUNITY)

## 2024-05-15 DIAGNOSIS — J189 Pneumonia, unspecified organism: Secondary | ICD-10-CM

## 2024-05-15 DIAGNOSIS — E876 Hypokalemia: Secondary | ICD-10-CM

## 2024-05-15 DIAGNOSIS — R197 Diarrhea, unspecified: Secondary | ICD-10-CM | POA: Diagnosis not present

## 2024-05-15 DIAGNOSIS — R9431 Abnormal electrocardiogram [ECG] [EKG]: Secondary | ICD-10-CM

## 2024-05-15 DIAGNOSIS — I10 Essential (primary) hypertension: Secondary | ICD-10-CM

## 2024-05-15 DIAGNOSIS — I2510 Atherosclerotic heart disease of native coronary artery without angina pectoris: Secondary | ICD-10-CM

## 2024-05-15 DIAGNOSIS — Z9861 Coronary angioplasty status: Secondary | ICD-10-CM

## 2024-05-15 DIAGNOSIS — N179 Acute kidney failure, unspecified: Secondary | ICD-10-CM

## 2024-05-15 DIAGNOSIS — A419 Sepsis, unspecified organism: Secondary | ICD-10-CM | POA: Diagnosis not present

## 2024-05-15 DIAGNOSIS — E782 Mixed hyperlipidemia: Secondary | ICD-10-CM

## 2024-05-15 DIAGNOSIS — N1832 Chronic kidney disease, stage 3b: Secondary | ICD-10-CM

## 2024-05-15 LAB — RESP PANEL BY RT-PCR (RSV, FLU A&B, COVID)  RVPGX2
Influenza A by PCR: NEGATIVE
Influenza B by PCR: NEGATIVE
Resp Syncytial Virus by PCR: NEGATIVE
SARS Coronavirus 2 by RT PCR: NEGATIVE

## 2024-05-15 LAB — CBC WITH DIFFERENTIAL/PLATELET
Abs Immature Granulocytes: 0.1 K/uL — ABNORMAL HIGH (ref 0.00–0.07)
Basophils Absolute: 0 K/uL (ref 0.0–0.1)
Basophils Relative: 0 %
Eosinophils Absolute: 0.1 K/uL (ref 0.0–0.5)
Eosinophils Relative: 1 %
HCT: 36.2 % (ref 36.0–46.0)
Hemoglobin: 11.9 g/dL — ABNORMAL LOW (ref 12.0–15.0)
Immature Granulocytes: 1 %
Lymphocytes Relative: 6 %
Lymphs Abs: 0.8 K/uL (ref 0.7–4.0)
MCH: 30.7 pg (ref 26.0–34.0)
MCHC: 32.9 g/dL (ref 30.0–36.0)
MCV: 93.5 fL (ref 80.0–100.0)
Monocytes Absolute: 1.8 K/uL — ABNORMAL HIGH (ref 0.1–1.0)
Monocytes Relative: 14 %
Neutro Abs: 10.3 K/uL — ABNORMAL HIGH (ref 1.7–7.7)
Neutrophils Relative %: 78 %
Platelets: 160 K/uL (ref 150–400)
RBC: 3.87 MIL/uL (ref 3.87–5.11)
RDW: 13.4 % (ref 11.5–15.5)
WBC: 13.1 K/uL — ABNORMAL HIGH (ref 4.0–10.5)
nRBC: 0 % (ref 0.0–0.2)

## 2024-05-15 LAB — COMPREHENSIVE METABOLIC PANEL WITH GFR
ALT: 22 U/L (ref 0–44)
AST: 26 U/L (ref 15–41)
Albumin: 4 g/dL (ref 3.5–5.0)
Alkaline Phosphatase: 80 U/L (ref 38–126)
Anion gap: 15 (ref 5–15)
BUN: 21 mg/dL (ref 8–23)
CO2: 18 mmol/L — ABNORMAL LOW (ref 22–32)
Calcium: 8.8 mg/dL — ABNORMAL LOW (ref 8.9–10.3)
Chloride: 103 mmol/L (ref 98–111)
Creatinine, Ser: 1.79 mg/dL — ABNORMAL HIGH (ref 0.44–1.00)
GFR, Estimated: 31 mL/min — ABNORMAL LOW (ref >60)
Glucose, Bld: 99 mg/dL (ref 70–99)
Potassium: 3.3 mmol/L — ABNORMAL LOW (ref 3.5–5.1)
Sodium: 137 mmol/L (ref 135–145)
Total Bilirubin: 0.5 mg/dL (ref 0.0–1.2)
Total Protein: 8.1 g/dL (ref 6.5–8.1)

## 2024-05-15 LAB — GLUCOSE, CAPILLARY: Glucose-Capillary: 105 mg/dL — ABNORMAL HIGH (ref 70–99)

## 2024-05-15 LAB — LACTIC ACID, PLASMA: Lactic Acid, Venous: 0.9 mmol/L (ref 0.5–1.9)

## 2024-05-15 LAB — URINALYSIS, W/ REFLEX TO CULTURE (INFECTION SUSPECTED)
Bacteria, UA: NONE SEEN
Bilirubin Urine: NEGATIVE
Glucose, UA: NEGATIVE mg/dL
Hgb urine dipstick: NEGATIVE
Ketones, ur: NEGATIVE mg/dL
Leukocytes,Ua: NEGATIVE
Nitrite: NEGATIVE
Protein, ur: 30 mg/dL — AB
Specific Gravity, Urine: 1.016 (ref 1.005–1.030)
pH: 5 (ref 5.0–8.0)

## 2024-05-15 LAB — PROTIME-INR
INR: 1.2 (ref 0.8–1.2)
Prothrombin Time: 16 s — ABNORMAL HIGH (ref 11.4–15.2)

## 2024-05-15 MED ORDER — SODIUM CHLORIDE 0.9 % IV SOLN
500.0000 mg | Freq: Once | INTRAVENOUS | Status: AC
  Administered 2024-05-15: 500 mg via INTRAVENOUS
  Filled 2024-05-15: qty 5

## 2024-05-15 MED ORDER — LACTATED RINGERS IV BOLUS
500.0000 mL | Freq: Once | INTRAVENOUS | Status: AC
  Administered 2024-05-15: 500 mL via INTRAVENOUS

## 2024-05-15 MED ORDER — SODIUM CHLORIDE 0.9 % IV SOLN
500.0000 mg | INTRAVENOUS | Status: AC
  Administered 2024-05-16 – 2024-05-19 (×4): 500 mg via INTRAVENOUS
  Filled 2024-05-15 (×4): qty 5

## 2024-05-15 MED ORDER — DM-GUAIFENESIN ER 30-600 MG PO TB12
1.0000 | ORAL_TABLET | Freq: Two times a day (BID) | ORAL | Status: DC
  Administered 2024-05-15 – 2024-05-20 (×9): 1 via ORAL
  Filled 2024-05-15 (×9): qty 1

## 2024-05-15 MED ORDER — LACTATED RINGERS IV SOLN: INTRAVENOUS | Status: DC

## 2024-05-15 MED ORDER — LACTATED RINGERS IV BOLUS (SEPSIS)
1000.0000 mL | Freq: Once | INTRAVENOUS | Status: AC
Start: 2024-05-15 — End: 2024-05-15
  Administered 2024-05-15: 1000 mL via INTRAVENOUS

## 2024-05-15 MED ORDER — ACETAMINOPHEN 500 MG PO TABS
1000.0000 mg | ORAL_TABLET | Freq: Once | ORAL | Status: AC
  Administered 2024-05-15: 1000 mg via ORAL
  Filled 2024-05-15: qty 2

## 2024-05-15 MED ORDER — SODIUM CHLORIDE 0.9 % IV SOLN
2.0000 g | Freq: Once | INTRAVENOUS | Status: AC
  Administered 2024-05-15: 2 g via INTRAVENOUS
  Filled 2024-05-15: qty 20

## 2024-05-15 NOTE — Progress Notes (Signed)
 Pt being followed by [facility] for Sepsis protocol.

## 2024-05-15 NOTE — Consult Note (Signed)
 CODE SEPSIS - PHARMACY COMMUNICATION  **Broad Spectrum Antibiotics should be administered within 1 hour of Sepsis diagnosis**  Time Code Sepsis Called/Page Received: 2005  Antibiotics Ordered: Azithromycin  and Rocephin   Time of 1st antibiotic administration: 2041  Additional action taken by pharmacy: n/a  If necessary, Name of Provider/Nurse Contacted: none    Annabella LOISE Banks ,PharmD Clinical Pharmacist  05/15/2024  8:20 PM

## 2024-05-15 NOTE — H&P (Signed)
 History and Physical    Patient: Cassidy Larson FMW:[id number] DOB: June 29, 1955 DOA: 05/15/2024 DOS: the patient was seen and examined on 05/15/2024 PCP: Buck Search, PA-C  Patient coming from: Home  Chief Complaint:  Chief Complaint  Patient presents with   Shortness of Breath   Fever   Back Pain   Diarrhea   Cough   HPI: Cassidy Larson is a 68 y.o. female with medical history significant of ASCVD and MI status post PCI October 2023, RA, hypertension, hyperlipidemia, CKD 3B, SLE who presents to the emergency department with complaint of 3 - 4 days of weakness, fatigue, shortness of breath, low back pain and diarrhea (multiple episodes daily).  She lives in a group home and was noted to be febrile today, so EMS was activated, on arrival of EMS team, she was noted to have an O2 sats of 90% on room air.  3 LPM of oxygen via [place] was provided she was taken to the ED for further evaluation.    ED course In the emergency department, she was febrile with a temperature of 102.68F, other vital signs are within normal range.  Workup in the ED showed normal CBC except for WBC of 13.1 and hemoglobin of 11.9.  BMP was normal except for potassium of 3.3, bicarb 18, creatinine 1.79 (baseline creatinine at 1.1-1.4) and calcium  8.8.  Lactic acid 0.9.  Influenza A, B, SARS, RSV, RSV was negative. Chest x-ray showed interval  development of hazy airspace opacity within the left mid lung with associated interstitial thickening. This is favored due to pneumonia. EKG personally reviewed showed normal sinus rhythm at a rate of 80 bpm with QTc of 580 ms She was treated with IV ceftriaxone  and azithromycin , Tylenol  was given due to fever, IV hydration per sepsis protocol was provided.   Review of Systems: As mentioned in the history of present illness. All other systems reviewed and are negative. Past Medical History:  Diagnosis Date   Anxiety    Blood transfusion without reported diagnosis    CAD in  native artery residual post LAD stent, 100% RCA and 80% LCx.  03/30/2022   CHF (congestive heart failure) (HCC)    CKD (chronic kidney disease) stage 4, GFR 15-29 ml/min (HCC)    Cocaine abuse (HCC)    Essential hypertension    HLD (hyperlipidemia) 03/30/2022   Lupus    On mechanically assisted ventilation (HCC) extubated 03/28/22  03/28/2022   Polysubstance abuse (HCC)    Positive ANA (antinuclear antibody)    S/P angioplasty with stent to mLAD 03/27/22 03/30/2022   Past Surgical History:  Procedure Laterality Date   CORONARY/GRAFT ACUTE MI REVASCULARIZATION N/A 03/27/2022   Procedure: Coronary/Graft Acute MI Revascularization;  Surgeon: Verlin Lonni BIRCH, MD;  Location: [facility];  Service: Cardiovascular;  Laterality: N/A;   LEFT HEART CATH AND CORONARY ANGIOGRAPHY N/A 03/27/2022   Procedure: LEFT HEART CATH AND CORONARY ANGIOGRAPHY;  Surgeon: Verlin Lonni BIRCH, MD;  Location: [facility];  Service: Cardiovascular;  Laterality: N/A;   Social History:  reports that she has been smoking cigarettes. She has never used smokeless tobacco. She reports that she does not currently use drugs after having used the following drugs: Marijuana and Crack cocaine. She reports that she does not drink alcohol.  Allergies  Allergen Reactions   Asa [Aspirin ] Diarrhea, Nausea And Vomiting and Other (See Comments)    Severe stomach cramps Diarrhea (sometimes with blood)   Latex Itching   Other Rash  Cotton fabric - rash    Family History  Family history unknown: Yes    Prior to Admission medications   Medication Sig Start Date End Date Taking? Authorizing Provider  acetaminophen  (TYLENOL ) 325 MG tablet Take by mouth. 07/21/23   [provider]  adalimumab (HUMIRA) 40 MG/0.8ML AJKT pen Inject into the skin. 09/01/23   [provider]  atorvastatin  (LIPITOR ) 40 MG tablet Take 40 mg by mouth at bedtime. 05/20/23   [provider]  benzonatate   (TESSALON ) 100 MG capsule Take 1 capsule (100 mg total) by mouth every 8 (eight) hours. 03/21/23   Ula Prentice SAUNDERS, MD  carvedilol  (COREG ) 6.25 MG tablet Take 1 tablet by mouth 2 (two) times daily with a meal. 05/20/23   [provider]  hydrALAZINE  (APRESOLINE ) 25 MG tablet Take 1 tablet by mouth 3 (three) times daily. 05/20/23   [provider]  hydroxychloroquine (PLAQUENIL) 200 MG tablet Take by mouth. 08/08/23   [provider]  isosorbide  mononitrate (IMDUR ) 30 MG 24 hr tablet Take 1 tablet (30 mg total) by mouth daily. 03/21/23   Ula Prentice SAUNDERS, MD  predniSONE  (DELTASONE ) 20 MG tablet 2 tabs po daily x 4 days 10/20/22   Mesner, Selinda, MD  sertraline  (ZOLOFT ) 50 MG tablet Take 50 mg by mouth daily. 05/14/23   [provider]  ticagrelor  (BRILINTA ) 90 MG TABS tablet Take 1 tablet (90 mg total) by mouth 2 (two) times daily. 03/21/23   Ula Prentice SAUNDERS, MD    Physical Exam: Vitals:   05/15/24 1949 05/15/24 1957 05/15/24 2000 05/15/24 2002  BP:    118/76  Pulse:   81   Resp:   19   Temp:   (!) 102.3 F (39.1 C)   TempSrc:   Oral   SpO2:   92%   Weight: 88.9 kg 49.9 kg    Height: 5' 2 (1.575 m) 5' (1.524 m)     General: Ill appearing.  Awake and alert and oriented x3. Not in any acute distress.  HEENT: NCAT.  PERRLA. EOMI. Sclerae anicteric.  Moist mucosal membranes. Neck: Neck supple without lymphadenopathy. No carotid bruits. No masses palpated.  Cardiovascular: Regular rate with normal S1-S2 sounds. No murmurs, rubs or gallops auscultated. No JVD.  Respiratory: Diffuse rhonchi on auscultation.  No accessory muscle use. Abdomen: Soft, nontender, nondistended. Active bowel sounds. No masses or hepatosplenomegaly  Skin: No rashes, lesions, or ulcerations.  Dry, warm to touch. Musculoskeletal:  2+ dorsalis pedis and radial pulses. Good ROM.  No contractures  Psychiatric: Intact judgment and insight.  Mood appropriate to current condition. Neurologic: No focal  neurological deficits. Strength is 5/5 x 4.  CN II - XII grossly intact.  Assessment and Plan: Sepsis due to pneumonia Patient met sepsis criteria due to being febrile and having leukocytosis (met sepsis criteria).  Chest x-ray and CT of chest were suggestive of pneumonia CT of chest was also suggestive of possible aspiration Patient was started on ceftriaxone  and azithromycin , we shall continue with Unasyn and azithromycin   at this time with plan to de-escalate/discontinue based on blood culture, sputum culture, urine Legionella, strep pneumo and procalcitonin Continue Tylenol  as needed Continue Mucinex , incentive spirometry, flutter valve  Acute diarrhea C. difficile and GI stool panel pending  Hypokalemia K+ 3.3, in the setting of diarrhea. This will be replenished  Hypocalcemia Calcium  8.8, continue Os-Cal  Acute kidney injury superimposed on CKD 3B Creatinine 1.79 (baseline creatinine at 1.1-1.4) Continue gentle hydration Renally adjust medications,  avoid nephrotoxic agents/dehydration/hypotension  Prolonged QT interval QTc 580 ms Avoid QT prolonging drugs Magnesium  level will be checked Repeat EKG in the morning  Essential hypertension BP meds will be held at this time due to soft BP  Mixed hyperlipidemia Continue Lipitor   CAD s/p PCI Continue Brilinta , Lipitor  Imdur  and Coreg  will be held at this time due to soft BP    Advance Care Planning: Full code  Consults: None  Family Communication: None at bedside  Severity of Illness: The appropriate patient status for this patient is INPATIENT. Inpatient status is judged to be reasonable and necessary in order to provide the required intensity of service to ensure the patient's safety. The patient's presenting symptoms, physical exam findings, and initial radiographic and laboratory data in the context of their chronic comorbidities is felt to place them at high risk for further clinical deterioration. Furthermore, it  is not anticipated that the patient will be medically stable for discharge from the hospital within 2 midnights of admission.   * I certify that at the point of admission it is my clinical judgment that the patient will require inpatient hospital care spanning beyond 2 midnights from the point of admission due to high intensity of service, high risk for further deterioration and high frequency of surveillance required.*  Author: Posey Maier, DO 05/15/2024 9:11 PM  For on call review [email or web address].

## 2024-05-15 NOTE — ED Notes (Signed)
 Pt had episode of bowel incontinence. Linens changed, peri care performed, brief applied.

## 2024-05-15 NOTE — ED Triage Notes (Deleted)
 SABRA

## 2024-05-15 NOTE — ED Notes (Signed)
 Writer called receiving inpatient unit to notify that patient would be coming up to them and asked if receiving RN had any questions. Receiving RN not available at this time.

## 2024-05-15 NOTE — ED Triage Notes (Signed)
 Pt brought in by EMS from group home for lower back pain (worse when standing, Stage 4 kidney disease - not on HD) starting yesterday. Pt reports cough with yellow mucous and associated SOB starting today. Fever of 100.23F for EMS. O2 90% on RA, placed on 3L. No acute respiratory distress noted on arrival. NSR on EKG. CBG 116. Pt also reports diarrhea. Bad urine odor noted on arrival.

## 2024-05-15 NOTE — ED Provider Notes (Signed)
 [place] EMERGENCY DEPARTMENT AT [facility] Provider Note   CSN: [id number] Arrival date & time: 05/15/24  1943     Patient presents with: Shortness of Breath, Fever, Back Pain, Diarrhea, and Cough  This patient is a 68 year old female, she is presenting to the hospital today with a complaint of feeling severely weak with back pain and some coughing and shortness of breath.  She is a very bad historian without much detail, she tells me that for the last couple of days she has felt bad but cannot tell me exactly what is been going on.  She was noted to be febrile and hypoxic for paramedics down to 90% on room air.  I have reviewed the medical record which shows that the patient is followed by rheumatology, she has rheumatoid arthritis, irritable bowel syndrome, history of a myocardial infarction, anxiety and hypertension, she has not been admitted to the hospital anytime recently, she was last admitted to the hospital in January 2024 for chest pain evaluation.  According to the medical record the patient takes Humira, Lipitor , Coreg , hydralazine , Plaquenil, Imdur , sertraline  and Brilinta .   Cassidy Larson is a 68 y.o. female.    Shortness of Breath Associated symptoms: cough and fever   Fever Associated symptoms: cough and diarrhea   Back Pain Associated symptoms: fever   Diarrhea Associated symptoms: fever   Cough Associated symptoms: fever and shortness of breath        Prior to Admission medications   Medication Sig Start Date End Date Taking? Authorizing Provider  acetaminophen  (TYLENOL ) 325 MG tablet Take by mouth. 07/21/23   [provider]  adalimumab (HUMIRA) 40 MG/0.8ML AJKT pen Inject into the skin. 09/01/23   [provider]  atorvastatin  (LIPITOR ) 40 MG tablet Take 40 mg by mouth at bedtime. 05/20/23   [provider]  benzonatate  (TESSALON ) 100 MG capsule Take 1 capsule (100 mg total) by mouth every 8 (eight) hours. 03/21/23    Ula Prentice SAUNDERS, MD  carvedilol  (COREG ) 6.25 MG tablet Take 1 tablet by mouth 2 (two) times daily with a meal. 05/20/23   [provider]  hydrALAZINE  (APRESOLINE ) 25 MG tablet Take 1 tablet by mouth 3 (three) times daily. 05/20/23   [provider]  hydroxychloroquine (PLAQUENIL) 200 MG tablet Take by mouth. 08/08/23   [provider]  isosorbide  mononitrate (IMDUR ) 30 MG 24 hr tablet Take 1 tablet (30 mg total) by mouth daily. 03/21/23   Ula Prentice SAUNDERS, MD  predniSONE  (DELTASONE ) 20 MG tablet 2 tabs po daily x 4 days 10/20/22   Mesner, Selinda, MD  sertraline  (ZOLOFT ) 50 MG tablet Take 50 mg by mouth daily. 05/14/23   [provider]  ticagrelor  (BRILINTA ) 90 MG TABS tablet Take 1 tablet (90 mg total) by mouth 2 (two) times daily. 03/21/23   Ula Prentice SAUNDERS, MD    Allergies: Asa [aspirin ], Latex, and Other    Review of Systems  Constitutional:  Positive for fever.  Respiratory:  Positive for cough and shortness of breath.   Gastrointestinal:  Positive for diarrhea.  Musculoskeletal:  Positive for back pain.  All other systems reviewed and are negative.   Updated Vital Signs BP 118/76   Pulse 81   Temp (!) 102.3 F (39.1 C) (Oral)   Resp 19   Ht 1.524 m (5')   Wt 49.9 kg   SpO2 92%   BMI 21.48 kg/m   Physical Exam Vitals and nursing note reviewed.  Constitutional:  General: She is in acute distress.     Appearance: She is well-developed. She is ill-appearing.  HENT:     Head: Normocephalic and atraumatic.     Mouth/Throat:     Mouth: Mucous membranes are moist.     Pharynx: No oropharyngeal exudate.  Eyes:     General: No scleral icterus.       Right eye: No discharge.        Left eye: No discharge.     Conjunctiva/sclera: Conjunctivae normal.     Pupils: Pupils are equal, round, and reactive to light.  Neck:     Thyroid: No thyromegaly.     Vascular: No JVD.  Cardiovascular:     Rate and Rhythm: Regular rhythm. Tachycardia present.      Heart sounds: Normal heart sounds. No murmur heard.    No friction rub. No gallop.  Pulmonary:     Effort: Pulmonary effort is normal. No respiratory distress.     Breath sounds: Rhonchi and rales present. No wheezing.  Abdominal:     General: Bowel sounds are normal. There is no distension.     Palpations: Abdomen is soft. There is no mass.     Tenderness: There is no abdominal tenderness.  Musculoskeletal:        General: No tenderness. Normal range of motion.     Cervical back: Normal range of motion and neck supple.     Right lower leg: No edema.     Left lower leg: No edema.  Lymphadenopathy:     Cervical: No cervical adenopathy.  Skin:    General: Skin is warm and dry.     Findings: No erythema or rash.  Neurological:     Mental Status: She is alert.     Coordination: Coordination normal.     Comments: Tremor of the bilateral upper extremities, no facial droop, equal grips  Psychiatric:        Behavior: Behavior normal.     (all labs ordered are listed, but only abnormal results are displayed) Labs Reviewed  COMPREHENSIVE METABOLIC PANEL WITH GFR - Abnormal; Notable for the following components:      Result Value   Potassium 3.3 (*)    CO2 18 (*)    Creatinine, Ser 1.79 (*)    Calcium  8.8 (*)    GFR, Estimated 31 (*)    All other components within normal limits  CBC WITH DIFFERENTIAL/PLATELET - Abnormal; Notable for the following components:   WBC 13.1 (*)    Hemoglobin 11.9 (*)    Neutro Abs 10.3 (*)    Monocytes Absolute 1.8 (*)    Abs Immature Granulocytes 0.10 (*)    All other components within normal limits  PROTIME-INR - Abnormal; Notable for the following components:   Prothrombin Time 16.0 (*)    All other components within normal limits  RESP PANEL BY RT-PCR (RSV, FLU A&B, COVID)  [id number]  CULTURE, BLOOD (ROUTINE X 2)  CULTURE, BLOOD (ROUTINE X 2)  LACTIC ACID, PLASMA  LACTIC ACID, PLASMA  URINALYSIS, W/ REFLEX TO CULTURE (INFECTION SUSPECTED)     EKG: EKG Interpretation Date/Time:  Saturday May 15 2024 20:10:20 EST Ventricular Rate:  80 PR Interval:  117 QRS Duration:  83 QT Interval:  502 QTC Calculation: 580 R Axis:   60  Text Interpretation: Sinus rhythm Borderline short PR interval Borderline repolarization abnormality Prolonged QT interval Confirmed by Cleotilde Rogue ([id number]) on 05/15/2024 8:14:46 PM  Radiology: [facility] Chest Port 1 View Result  Date: 05/15/2024 EXAM: 1 VIEW(S) XRAY OF THE CHEST 05/15/2024 08:30:57 PM COMPARISON: 06/20/2023 CLINICAL HISTORY: Questionable sepsis - evaluate for abnormality FINDINGS: LUNGS AND PLEURA: Interval development of hazy airspace opacity within left mid lung with associated interstitial thickening in the left lung. No pleural effusion. No pneumothorax. HEART AND MEDIASTINUM: Atherosclerotic plaque noted. No acute abnormality of the cardiac and mediastinal silhouettes. BONES AND SOFT TISSUES: No acute osseous abnormality. IMPRESSION: 1. Interval development of hazy airspace opacity within the left mid lung with associated interstitial thickening. This is favored due to pneumonia however a mass is not excluded. Recommend CT for further evaluation. Electronically signed by: Norman Gatlin MD 05/15/2024 08:36 PM EST RP Workstation: [id number]     .Critical Care  Performed by: Cleotilde Rogue, MD Authorized by: Cleotilde Rogue, MD   Critical care provider statement:    Critical care time (minutes):  45   Critical care time was exclusive of:  Separately billable procedures and treating other patients and teaching time   Critical care was necessary to treat or prevent imminent or life-threatening deterioration of the following conditions:  Sepsis   Critical care was time spent personally by me on the following activities:  Development of treatment plan with patient or surrogate, discussions with consultants, evaluation of patient's response to treatment, examination of patient, obtaining history  from patient or surrogate, review of old charts, re-evaluation of patient's condition, pulse oximetry, ordering and review of radiographic studies, ordering and review of laboratory studies and ordering and performing treatments and interventions   I assumed direction of critical care for this patient from another provider in my specialty: no     Care discussed with: admitting provider   Comments:          Medications Ordered in the ED  lactated ringers  infusion (has no administration in time range)  lactated ringers  bolus 1,000 mL (1,000 mLs Intravenous New Bag/Given 05/15/24 2043)  cefTRIAXone  (ROCEPHIN ) 2 g in sodium chloride  0.9 % 100 mL IVPB (2 g Intravenous New Bag/Given 05/15/24 2041)  azithromycin  (ZITHROMAX ) 500 mg in sodium chloride  0.9 % 250 mL IVPB (500 mg Intravenous New Bag/Given 05/15/24 2051)  acetaminophen  (TYLENOL ) tablet 1,000 mg (1,000 mg Oral Given 05/15/24 2044)                                    Medical Decision Making Amount and/or Complexity of Data Reviewed Labs: ordered. Radiology: ordered.  Risk OTC drugs. Prescription drug management. Decision regarding hospitalization.    This patient presents to the ED for concern of fever back pain coughing shortness of breath and hypoxia, this involves an extensive number of treatment options, and is a complaint that carries with it a high risk of complications and morbidity.  The differential diagnosis includes sepsis, would consider pneumonia, could be an empyema, she has some decreased lung sounds, history of substance abuse and endorses crack cocaine use many years ago but not recently.  She does not drink any alcohol, she has had some diarrhea   Co morbidities / Chronic conditions that complicate the patient evaluation  History of substance abuse, heart disease   Additional history obtained:  Additional history obtained from EMR External records from outside source obtained and reviewed including medical  record, see above, rheumatoid arthritis immunosuppressed   Lab Tests:  I Ordered, and personally interpreted labs.  The pertinent results include: Leukocytosis present, mild renal insufficiency worse than usual but  not terrible   Imaging Studies ordered:  I ordered imaging studies including chest x-ray I independently visualized and interpreted imaging which showed pneumonia I agree with the radiologist interpretation   Cardiac Monitoring: / EKG:  The patient was maintained on a cardiac monitor.  I personally viewed and interpreted the cardiac monitored which showed an underlying rhythm of: Sinus rhythm with a heart rate of around 90   Problem List / ED Course / Critical interventions / Medication management  The patient has evidence of pneumonia with fever, leukocytosis, thankfully no hypotension and a normal lactic acid but meets criteria for sepsis I ordered medication including antibiotics Reevaluation of the patient after these medicines showed that the patient improved to some degree after getting antibiotics and Tylenol , Rocephin  and Zithromax  given, IV fluids given I have reviewed the patients home medicines and have made adjustments as needed   Consultations Obtained:  I requested consultation with the hospitalist,  and discussed lab and imaging findings as well as pertinent plan - they recommend: Admission   Social Determinants of Health:  History of substance abuse   Test / Admission - Considered:  Niccoli ill, needs to be admitted to the hospital      Final diagnoses:  Sepsis, due to unspecified organism, unspecified whether acute organ dysfunction present [facility])    ED Discharge Orders     None          Cleotilde Rogue, MD 05/15/24 2054

## 2024-05-16 LAB — COMPREHENSIVE METABOLIC PANEL WITH GFR
ALT: 28 U/L (ref 0–44)
AST: 35 U/L (ref 15–41)
Albumin: 3.5 g/dL (ref 3.5–5.0)
Alkaline Phosphatase: 84 U/L (ref 38–126)
Anion gap: 14 (ref 5–15)
BUN: 18 mg/dL (ref 8–23)
CO2: 20 mmol/L — ABNORMAL LOW (ref 22–32)
Calcium: 8.4 mg/dL — ABNORMAL LOW (ref 8.9–10.3)
Chloride: 105 mmol/L (ref 98–111)
Creatinine, Ser: 1.59 mg/dL — ABNORMAL HIGH (ref 0.44–1.00)
GFR, Estimated: 35 mL/min — ABNORMAL LOW (ref >60)
Glucose, Bld: 88 mg/dL (ref 70–99)
Potassium: 3.5 mmol/L (ref 3.5–5.1)
Sodium: 139 mmol/L (ref 135–145)
Total Bilirubin: 0.4 mg/dL (ref 0.0–1.2)
Total Protein: 7.2 g/dL (ref 6.5–8.1)

## 2024-05-16 LAB — C DIFFICILE QUICK SCREEN W PCR REFLEX
C Diff antigen: NEGATIVE
C Diff interpretation: NOT DETECTED
C Diff toxin: NEGATIVE

## 2024-05-16 LAB — CBC
HCT: 33.5 % — ABNORMAL LOW (ref 36.0–46.0)
Hemoglobin: 11.1 g/dL — ABNORMAL LOW (ref 12.0–15.0)
MCH: 31 pg (ref 26.0–34.0)
MCHC: 33.1 g/dL (ref 30.0–36.0)
MCV: 93.6 fL (ref 80.0–100.0)
Platelets: 147 K/uL — ABNORMAL LOW (ref 150–400)
RBC: 3.58 MIL/uL — ABNORMAL LOW (ref 3.87–5.11)
RDW: 13.3 % (ref 11.5–15.5)
WBC: 15.2 K/uL — ABNORMAL HIGH (ref 4.0–10.5)
nRBC: 0 % (ref 0.0–0.2)

## 2024-05-16 LAB — PROCALCITONIN: Procalcitonin: 1.12 ng/mL

## 2024-05-16 LAB — MAGNESIUM: Magnesium: 1.9 mg/dL (ref 1.7–2.4)

## 2024-05-16 LAB — HIV ANTIBODY (ROUTINE TESTING W REFLEX): HIV Screen 4th Generation wRfx: NONREACTIVE

## 2024-05-16 LAB — STREP PNEUMONIAE URINARY ANTIGEN: Strep Pneumo Urinary Antigen: NEGATIVE

## 2024-05-16 LAB — PHOSPHORUS: Phosphorus: 2.8 mg/dL (ref 2.5–4.6)

## 2024-05-16 MED ORDER — ACETAMINOPHEN 650 MG RE SUPP
650.0000 mg | Freq: Four times a day (QID) | RECTAL | Status: DC | PRN
  Administered 2024-05-16: 650 mg via RECTAL
  Filled 2024-05-16: qty 1

## 2024-05-16 MED ORDER — LACTATED RINGERS IV SOLN: INTRAVENOUS | Status: AC

## 2024-05-16 MED ORDER — ONDANSETRON HCL 4 MG/2ML IJ SOLN: 4.0000 mg | Freq: Four times a day (QID) | INTRAMUSCULAR | Status: DC | PRN

## 2024-05-16 MED ORDER — ISOSORBIDE MONONITRATE ER 30 MG PO TB24
30.0000 mg | ORAL_TABLET | Freq: Every day | ORAL | Status: DC
  Administered 2024-05-17 – 2024-05-20 (×4): 30 mg via ORAL
  Filled 2024-05-16 (×4): qty 1

## 2024-05-16 MED ORDER — SERTRALINE HCL 50 MG PO TABS
100.0000 mg | ORAL_TABLET | Freq: Every day | ORAL | Status: DC
  Administered 2024-05-17 – 2024-05-20 (×4): 100 mg via ORAL
  Filled 2024-05-16 (×4): qty 2

## 2024-05-16 MED ORDER — CARVEDILOL 3.125 MG PO TABS
6.2500 mg | ORAL_TABLET | Freq: Two times a day (BID) | ORAL | Status: DC
  Administered 2024-05-17 – 2024-05-20 (×7): 6.25 mg via ORAL
  Filled 2024-05-16 (×7): qty 2

## 2024-05-16 MED ORDER — ACETAMINOPHEN 325 MG PO TABS
650.0000 mg | ORAL_TABLET | Freq: Four times a day (QID) | ORAL | Status: DC | PRN
  Administered 2024-05-16 – 2024-05-19 (×4): 650 mg via ORAL
  Filled 2024-05-16 (×4): qty 2

## 2024-05-16 MED ORDER — ONDANSETRON HCL 4 MG PO TABS: 4.0000 mg | ORAL_TABLET | Freq: Four times a day (QID) | ORAL | Status: DC | PRN

## 2024-05-16 MED ORDER — SODIUM CHLORIDE 0.9 % IV SOLN
3.0000 g | Freq: Two times a day (BID) | INTRAVENOUS | Status: DC
  Administered 2024-05-16 – 2024-05-18 (×5): 3 g via INTRAVENOUS
  Filled 2024-05-16 (×5): qty 8

## 2024-05-16 MED ORDER — ATORVASTATIN CALCIUM 40 MG PO TABS
40.0000 mg | ORAL_TABLET | Freq: Every day | ORAL | Status: DC
  Administered 2024-05-17 – 2024-05-20 (×4): 40 mg via ORAL
  Filled 2024-05-16 (×4): qty 1

## 2024-05-16 MED ORDER — CALCIUM CARBONATE 1250 (500 CA) MG PO TABS
1.0000 | ORAL_TABLET | Freq: Every day | ORAL | Status: DC
  Administered 2024-05-17 – 2024-05-20 (×4): 1250 mg via ORAL
  Filled 2024-05-16 (×4): qty 1

## 2024-05-16 MED ORDER — ENOXAPARIN SODIUM 30 MG/0.3ML IJ SOSY
30.0000 mg | PREFILLED_SYRINGE | INTRAMUSCULAR | Status: DC
  Administered 2024-05-16 – 2024-05-20 (×5): 30 mg via SUBCUTANEOUS
  Filled 2024-05-16 (×5): qty 0.3

## 2024-05-16 MED ORDER — TICAGRELOR 90 MG PO TABS: 90.0000 mg | ORAL_TABLET | Freq: Two times a day (BID) | ORAL | Status: DC

## 2024-05-16 MED ORDER — ACETAMINOPHEN 650 MG RE SUPP: 325.0000 mg | Freq: Three times a day (TID) | RECTAL | Status: DC | PRN

## 2024-05-16 MED ORDER — LACTATED RINGERS IV SOLN: INTRAVENOUS | Status: DC

## 2024-05-16 MED ORDER — POTASSIUM CHLORIDE CRYS ER 20 MEQ PO TBCR
40.0000 meq | EXTENDED_RELEASE_TABLET | Freq: Once | ORAL | Status: AC
  Administered 2024-05-16: 40 meq via ORAL
  Filled 2024-05-16: qty 2

## 2024-05-16 MED ORDER — MIRTAZAPINE 15 MG PO TABS
7.5000 mg | ORAL_TABLET | Freq: Every day | ORAL | Status: DC
  Administered 2024-05-16 – 2024-05-19 (×4): 7.5 mg via ORAL
  Filled 2024-05-16 (×4): qty 1

## 2024-05-16 NOTE — TOC Initial Note (Addendum)
 Transition of Care [facility]) - Inpatient Brief Assessment   Patient Details  Name: Cassidy Larson MRN: [id number] Date of Birth: 10-18-1955  Transition of Care [facility]) CM/SW Contact:    Sharlyne Stabs, RN Phone Number: 05/16/2024, 3:01 PM   Clinical Narrative:  Patient admitted with sepsis due to pneumonia. Chart states patient is from a group home. CM called Legal Guardian, Lepria. Patient is from [facility]. She has been there 2-3 months. She is independent there per her Lapria.  Continue medical work up. PT eval pending. FL2 started. IPCM following.  Transition of Care Asessment: Insurance and Status: Insurance coverage has been reviewed Patient has primary care physician: Yes Home environment has been reviewed: From [employer] Prior level of function:: Independent Prior/Current Home Services: No current home services Social Drivers of Health Review: SDOH reviewed no interventions necessary Readmission risk has been reviewed: Yes Transition of care needs: no transition of care needs at this time    Barriers to Discharge: Continued Medical Work up   Patient Goals and CMS Choice Patient states their goals for this hospitalization and ongoing recovery are:: return to [facility] CMS Medicare.gov Compare Post Acute Care list provided to:: Patient Represenative (must comment) Choice offered to / list presented to : [facility] POA / Guardian [place] ownership interest in [facility].provided to:: [facility] POA / Guardian    Prior Living Arrangements/Services   Lives with:: Facility Resident Patient language and need for interpreter reviewed:: Yes Do you feel safe going back to the place where you live?: Yes        Care giver support system in place?: Yes (comment)   Criminal Activity/Legal Involvement Pertinent to Current Situation/Hospitalization: No - Comment as needed  Activities of Daily Living   ADL Screening (condition at time of  admission) Independently performs ADLs?: No Does the patient have a NEW difficulty with bathing/dressing/toileting/self-feeding that is expected to last >3 days?: Yes (Initiates electronic notice to provider for possible OT consult) Does the patient have a NEW difficulty with getting in/out of bed, walking, or climbing stairs that is expected to last >3 days?: Yes (Initiates electronic notice to provider for possible PT consult) Does the patient have a NEW difficulty with communication that is expected to last >3 days?: No Is the patient deaf or have difficulty hearing?: No Does the patient have difficulty seeing, even when wearing glasses/contacts?: No Does the patient have difficulty concentrating, remembering, or making decisions?: No  Permission Sought/Granted      Legal Guardian  Emotional Assessment     Affect (typically observed): Accepting Orientation: : Oriented to Self, Oriented to Place, Oriented to  Time, Oriented to Situation Alcohol / Substance Use: Not Applicable Psych Involvement: No (comment)  Admission diagnosis:  Sepsis due to pneumonia (HCC) [J18.9, A41.9] Sepsis, due to unspecified organism, unspecified whether acute organ dysfunction present [facility]) [A41.9] Patient Active Problem List   Diagnosis Date Noted   Sepsis due to pneumonia (HCC) 05/15/2024   Prediabetes 05/21/2023   Subclinical hyperthyroidism 05/21/2023   History of MI (myocardial infarction) 03/20/2023   Memory difficulties 03/20/2023   Poor social situation 03/20/2023   Protein-calorie malnutrition, severe 07/06/2022   Cough 07/06/2022   Nasal congestion 07/06/2022   CKD (chronic kidney disease), stage III (HCC) 07/05/2022   Elevated liver enzymes 07/03/2022   Norovirus 07/03/2022   Hypotension 04/19/2022   CAD in native artery residual post LAD stent, 100% RCA and 80% LCx.  03/30/2022   S/P angioplasty with stent to mLAD  03/27/22 03/30/2022   HLD (hyperlipidemia) 03/30/2022   Malnutrition of  moderate degree 03/30/2022   On mechanically assisted ventilation (HCC) extubated 03/28/22  03/28/2022   Acute respiratory failure with hypoxia (HCC) 03/28/2022   Cocaine abuse (HCC) 03/28/2022   Tobacco abuse 03/28/2022   Acute ST elevation myocardial infarction (STEMI) due to occlusion of left anterior descending (LAD) coronary artery (HCC)    Positive ANA (antinuclear antibody) 08/25/2017   CKD (chronic kidney disease) stage 4, GFR 15-29 ml/min (HCC) 08/18/2017   CKD (chronic kidney disease) 08/18/2017   Hypertension 08/17/2017   Headache 08/17/2017   Substance abuse (HCC) 08/17/2017   Essential hypertension 08/17/2017   Dysthymic disorder 07/13/2013   Polysubstance abuse (HCC) 07/13/2013   PCP:  [name], FNP Pharmacy:   [facility] [phone] - [place], [place] - [address] AT [facility] OF [address] [facility] [place] [address] Phone: [phone] Fax: [phone]  [facility] [address] [place] [address] Phone: [phone] Fax: [phone]  [facility] [phone] - [place], [place] - [address] AT [facility] OF [address] & [address] [place] [address] Phone: [phone] Fax: [phone]  Social Drivers of Health (SDOH) Social History: SDOH Screenings   Food Insecurity: No Food Insecurity (05/15/2024)  Housing: Low Risk  (05/15/2024)  Transportation Needs: Unmet Transportation Needs (05/15/2024)  Utilities: Not At Risk (05/15/2024)  Depression (PHQ2-9): Low Risk  (03/09/2019)  Financial Resource Strain: Not on File (11/14/2022)   Received from [facility]  Physical Activity: Not on File (11/14/2022)   Received from [facility]  Social Connections: Socially Isolated (05/15/2024)  Stress: Not on File (11/14/2022)   Received from [facility]  Tobacco Use: High Risk (05/15/2024)     Readmission Risk Interventions    05/16/2024    2:59 PM  Readmission Risk Prevention  Plan  Transportation Screening Complete  PCP or Specialist Appt within 5-7 Days Not Complete  Home Care Screening Complete  Medication Review (RN CM) Complete

## 2024-05-16 NOTE — Progress Notes (Signed)
   05/16/24 1406  Assess: MEWS Score  Temp (!) 102.7 F (39.3 C) (rn notified)  BP 129/62  MAP (mmHg) 82  Pulse Rate 81  ECG Heart Rate 81  Resp 20  SpO2 96 %  O2 Device Nasal Cannula  O2 Flow Rate (L/min) 2 L/min  Assess: MEWS Score  MEWS Temp 2  MEWS Systolic 0  MEWS Pulse 0  MEWS RR 0  MEWS LOC 0  MEWS Score 2  MEWS Score Color Yellow  Assess: if the MEWS score is Yellow or Red  Were vital signs accurate and taken at a resting state? Yes  Does the patient meet 2 or more of the SIRS criteria? Yes  Does the patient have a confirmed or suspected source of infection? No  MEWS guidelines implemented  Yes, yellow  Treat  MEWS Interventions Considered administering scheduled or prn medications/treatments as ordered  Take Vital Signs  Increase Vital Sign Frequency  Yellow: Q2hr x1, continue Q4hrs until patient remains green for 12hrs  Escalate  MEWS: Escalate Yellow: Discuss with charge nurse and consider notifying provider and/or RRT  Notify: Charge Nurse/RN  Name of Charge Nurse/RN Notified [facility] RN  Provider Notification  Provider Name/Title DR. Sigdel  Date Provider Notified 05/16/24  Time Provider Notified 1440  Method of Notification Page  Notification Reason Change in status  Provider response See new orders  Date of Provider Response 05/16/24  Time of Provider Response 1444  Assess: SIRS CRITERIA  SIRS Temperature  1  SIRS Respirations  0  SIRS Pulse 0  SIRS WBC 1  SIRS Score Sum  2

## 2024-05-16 NOTE — Plan of Care (Signed)

## 2024-05-16 NOTE — Plan of Care (Signed)

## 2024-05-16 NOTE — Progress Notes (Signed)
 PROGRESS NOTE    Cassidy Larson  FMW:[id number] DOB: 02-15-1956 DOA: 05/15/2024 PCP: [name], FNP   Brief Narrative:    Cassidy Larson is a 68 y.o. female with medical history significant of ASCVD and MI status post PCI October 2023, RA, hypertension, hyperlipidemia, CKD 3B, SLE who presents to the emergency department with complaint of 3 - 4 days of weakness, fatigue, shortness of breath, low back pain and diarrhea (multiple episodes daily).  She lives in a group home and was noted to be febrile today, so EMS was activated, on arrival of EMS team, she was noted to have an O2 sats of 90% on room air.  3 LPM of oxygen via [place] was provided she was taken to the ED for further evaluation.     ED course In the emergency department, she was febrile with a temperature of 102.15F, other vital signs are within normal range.  Workup in the ED showed normal CBC except for WBC of 13.1 and hemoglobin of 11.9.  BMP was normal except for potassium of 3.3, bicarb 18, creatinine 1.79 (baseline creatinine at 1.1-1.4) and calcium  8.8.  Lactic acid 0.9.  Influenza A, B, SARS, RSV, RSV was negative. Chest x-ray showed interval  development of hazy airspace opacity within the left mid lung with associated interstitial thickening. This is favored due to pneumonia. EKG personally reviewed showed normal sinus rhythm at a rate of 80 bpm with QTc of 580 ms She was treated with IV ceftriaxone  and azithromycin , Tylenol  was given due to fever, IV hydration per sepsis protocol was provided.  Since admission, patient was initiated on IV Unasyn for aspiration pneumonia.  Continued on IV fluid.  C. difficile testing negative   Assessment & Plan:   Principal Problem:   Sepsis due to pneumonia (HCC)   Sepsis due to pneumonia Concern for aspiration pneumonia  Patient met sepsis criteria due to being febrile and having leukocytosis (met sepsis criteria).  Chest x-ray and CT of chest were suggestive of  pneumonia.  Left upper lobe consolidation versus malignancy.  Needs follow-up CT scan CT of chest was also suggestive of possible aspiration Continue IV Unasyn, azithromycin , follow-up pleural fluid culture, sputum culture. continue Tylenol  as needed Continue Mucinex , incentive spirometry, flutter valve SLP eval for dysphagia   Acute diarrhea C. difficile if negative, will test full GI stool panel     Hypokalemia K+ 3.3, in the setting of diarrhea. This will be replenished   Hypocalcemia Calcium  8.8, continue Os-Cal   Acute kidney injury superimposed on CKD 3B Creatinine 1.79 (baseline creatinine at 1.1-1.4) Continue gentle hydration Renally adjust medications, avoid nephrotoxic agents/dehydration/hypotension   Prolonged QT interval QTc 580 ms Avoid QT prolonging drugs Magnesium  level will be checked Repeat EKG in the morning   Essential hypertension BP meds will be held at this time due to soft BP   Mixed hyperlipidemia Continue Lipitor    CAD s/p PCI Continue Brilinta , Lipitor  Imdur  and Coreg  will be held at this time due to soft BP      Advance Care Planning: Full code   Consults: None   Family Communication: None at bedside   Severity of Illness: The appropriate patient status for this patient is INPATIENT. Inpatient status is judged to be reasonable and necessary in order to provide the required intensity of service to ensure the patient's safety. The patient's presenting symptoms, physical exam findings, and initial radiographic and laboratory data in the context of their chronic comorbidities is felt to place them  at high risk for further clinical deterioration. Furthermore, it is not anticipated that the patient will be medically stable for discharge from the hospital within 2 midnights of admission.    * I certify that at the point of admission it is my clinical judgment that the patient will require inpatient hospital care spanning beyond 2 midnights from the  point of admission due to high intensity of service, high risk for further deterioration and high frequency of surveillance required.*   Subjective:  Patient seen and examined at the bedside.  She is alert and oriented.  Vital signs stable, low-grade temperature of 100 F today.  She continues to report of generalized weakness and loose stools.  Some cough, requiring supplemental oxygen.  She also reports generalized pain  Objective: Vitals:   05/15/24 2145 05/15/24 2224 05/16/24 0232 05/16/24 1018  BP: (!) 103/51 108/60 136/64 108/60  Pulse: 78 69 71 71  Resp: (!) 21 17 18 17   Temp:  99 F (37.2 C) 99.5 F (37.5 C) 100 F (37.8 C)  TempSrc:  Oral Oral Oral  SpO2: 99% 98% 96% 96%  Weight:  45.2 kg    Height:  5' (1.524 m)      Intake/Output Summary (Last 24 hours) at 05/16/2024 1110 Last data filed at 05/16/2024 0300 Gross per 24 hour  Intake 559.38 ml  Output --  Net 559.38 ml   Filed Weights   05/15/24 1949 05/15/24 1957 05/15/24 2224  Weight: 88.9 kg 49.9 kg 45.2 kg    Examination:  General: Chronically ill looking, not in any acute distress HEENT: Dry oral mucosa Chest: Mild crackles bilaterally, no wheezing CVS: S1, S2, no murmur, regular rhythm Abdomen: Soft, nontender Extremities: No edema Neuro: No focal deficits   Data Reviewed: I have personally reviewed following labs and imaging studies  CBC: Recent Labs  Lab 05/15/24 2024 05/16/24 0607  WBC 13.1* 15.2*  NEUTROABS 10.3*  --   HGB 11.9* 11.1*  HCT 36.2 33.5*  MCV 93.5 93.6  PLT 160 147*   Basic Metabolic Panel: Recent Labs  Lab 05/15/24 2024 05/16/24 0607  NA 137 139  K 3.3* 3.5  CL 103 105  CO2 18* 20*  GLUCOSE 99 88  BUN 21 18  CREATININE 1.79* 1.59*  CALCIUM  8.8* 8.4*  MG  --  1.9  PHOS  --  2.8   GFR: Estimated Creatinine Clearance: 24.8 mL/min (A) (by C-G formula based on SCr of 1.59 mg/dL (H)). Liver Function Tests: Recent Labs  Lab 05/15/24 2024 05/16/24 0607  AST 26  35  ALT 22 28  ALKPHOS 80 84  BILITOT 0.5 0.4  PROT 8.1 7.2  ALBUMIN 4.0 3.5   No results for input(s): LIPASE, AMYLASE in the last 168 hours. No results for input(s): AMMONIA in the last 168 hours. Coagulation Profile: Recent Labs  Lab 05/15/24 2024  INR 1.2   Cardiac Enzymes: No results for input(s): CKTOTAL, CKMB, CKMBINDEX, TROPONINI in the last 168 hours. BNP (last 3 results) No results for input(s): PROBNP in the last 8760 hours. HbA1C: No results for input(s): HGBA1C in the last 72 hours. CBG: Recent Labs  Lab 05/15/24 2253  GLUCAP 105*   Lipid Profile: No results for input(s): CHOL, HDL, LDLCALC, TRIG, CHOLHDL, LDLDIRECT in the last 72 hours. Thyroid Function Tests: No results for input(s): TSH, T4TOTAL, FREET4, T3FREE, THYROIDAB in the last 72 hours. Anemia Panel: No results for input(s): VITAMINB12, FOLATE, FERRITIN, TIBC, IRON, RETICCTPCT in the last 72 hours. Sepsis  Labs: Recent Labs  Lab 05/15/24 2024 05/16/24 0541  PROCALCITON  --  1.12  LATICACIDVEN 0.9  --     Recent Results (from the past 240 hours)  Blood Culture (routine x 2)     Status: None (Preliminary result)   Collection Time: 05/15/24  8:24 PM   Specimen: BLOOD LEFT WRIST  Result Value Ref Range Status   Specimen Description BLOOD LEFT WRIST  Final   Special Requests   Final    BOTTLES DRAWN AEROBIC AND ANAEROBIC Blood Culture adequate volume   Culture   Final    NO GROWTH < 12 HOURS Performed at [facility], [address] [place], [place] [address]    Report Status PENDING  Incomplete  Resp panel by RT-PCR (RSV, Flu A&B, Covid) Anterior Nasal Swab     Status: None   Collection Time: 05/15/24  8:38 PM   Specimen: Anterior Nasal Swab  Result Value Ref Range Status   SARS Coronavirus 2 by RT PCR NEGATIVE NEGATIVE Final    Comment: (NOTE) SARS-CoV-2 target nucleic acids are NOT DETECTED.  The SARS-CoV-2 RNA is generally  detectable in upper respiratory specimens during the acute phase of infection. The lowest concentration of SARS-CoV-2 viral copies this assay can detect is 138 copies/mL. A negative result does not preclude SARS-Cov-2 infection and should not be used as the sole basis for treatment or other patient management decisions. A negative result may occur with  improper specimen collection/handling, submission of specimen other than nasopharyngeal swab, presence of viral mutation(s) within the areas targeted by this assay, and inadequate number of viral copies(<138 copies/mL). A negative result must be combined with clinical observations, patient history, and epidemiological information. The expected result is Negative.  Fact Sheet for Patients:  bloggercourse.com  Fact Sheet for Healthcare Providers:  seriousbroker.it  This test is no t yet approved or cleared by the [place]  FDA and  has been authorized for detection and/or diagnosis of SARS-CoV-2 by FDA under an Emergency Use Authorization (EUA). This EUA will remain  in effect (meaning this test can be used) for the duration of the COVID-19 declaration under Section 564(b)(1) of the Act, 21 U.S.C.section 360bbb-3(b)(1), unless the authorization is terminated  or revoked sooner.       Influenza A by PCR NEGATIVE NEGATIVE Final   Influenza B by PCR NEGATIVE NEGATIVE Final    Comment: (NOTE) The Xpert Xpress SARS-CoV-2/FLU/RSV plus assay is intended as an aid in the diagnosis of influenza from Nasopharyngeal swab specimens and should not be used as a sole basis for treatment. Nasal washings and aspirates are unacceptable for Xpert Xpress SARS-CoV-2/FLU/RSV testing.  Fact Sheet for Patients: bloggercourse.com  Fact Sheet for Healthcare Providers: seriousbroker.it  This test is not yet approved or cleared by the [place]  FDA  and has been authorized for detection and/or diagnosis of SARS-CoV-2 by FDA under an Emergency Use Authorization (EUA). This EUA will remain in effect (meaning this test can be used) for the duration of the COVID-19 declaration under Section 564(b)(1) of the Act, 21 U.S.C. section 360bbb-3(b)(1), unless the authorization is terminated or revoked.     Resp Syncytial Virus by PCR NEGATIVE NEGATIVE Final    Comment: (NOTE) Fact Sheet for Patients: bloggercourse.com  Fact Sheet for Healthcare Providers: seriousbroker.it  This test is not yet approved or cleared by the [place]  FDA and has been authorized for detection and/or diagnosis of SARS-CoV-2 by FDA under an Emergency Use Authorization (EUA). This EUA will remain  in effect (meaning this test can be used) for the duration of the COVID-19 declaration under Section 564(b)(1) of the Act, 21 U.S.C. section 360bbb-3(b)(1), unless the authorization is terminated or revoked.  Performed at [facility], [address] [place], [place] [address]   Blood Culture (routine x 2)     Status: None (Preliminary result)   Collection Time: 05/15/24  8:38 PM   Specimen: Right Antecubital; Blood  Result Value Ref Range Status   Specimen Description RIGHT ANTECUBITAL  Final   Special Requests   Final    BOTTLES DRAWN AEROBIC ONLY Blood Culture adequate volume   Culture   Final    NO GROWTH < 12 HOURS Performed at [facility], [address], [place], [place] [address]    Report Status PENDING  Incomplete  C Difficile Quick Screen w PCR reflex     Status: None   Collection Time: 05/16/24  3:14 AM   Specimen: STOOL  Result Value Ref Range Status   C Diff antigen NEGATIVE NEGATIVE Final   C Diff toxin NEGATIVE NEGATIVE Final   C Diff interpretation No C. difficile detected.  Final    Comment: Performed at [facility], [address] [place], [place] [address]         Radiology  Studies: CT CHEST WO CONTRAST Result Date: 05/15/2024 EXAM: CT CHEST WITHOUT CONTRAST 05/15/2024 10:07:13 PM TECHNIQUE: CT of the chest was performed without the administration of intravenous contrast. Multiplanar reformatted images are provided for review. Automated exposure control, iterative reconstruction, and/or weight based adjustment of the mA/kV was utilized to reduce the radiation dose to as low as reasonably achievable. COMPARISON: Comparison with prior CT dated 06/20/2023 and same day x-ray. CLINICAL HISTORY: Abnormal xray - lung opacity/opacities. FINDINGS: MEDIASTINUM: Heart and pericardium are unremarkable. Coronary artery and aortic atherosclerotic calcifications. Enlarged heterogeneous thyroid, 2 cm more than prior. Debris in the trachea extending into the bilateral mainstem bronchi. LYMPH NODES: No mediastinal, hilar or axillary lymphadenopathy. LUNGS AND PLEURA: Centrilobular and paraseptal emphysema. Irregular consolidation and associated interlobular septal thickening in the left upper lobe. The solid area of consolidation measures 2.9 x 2.3 cm on series 2 image 57. Bibasilar atelectasis/scarring. No pleural effusion or pneumothorax. SOFT TISSUES/BONES: No acute abnormality of the bones or soft tissues. UPPER ABDOMEN: Limited images of the upper abdomen demonstrates no acute abnormality. IMPRESSION: 1. Irregular left upper lobe consolidation with associated interlobular septal thickening measuring 2.9 x 2.3 cm. Primary differential considerations are pneumonia versus malignancy. Recommend short-interval non-contrast chest CT in 6-8 weeks to ensure resolution after antibiotic treatment. 2. Debris within the trachea extending into both mainstem bronchi, which may reflect aspiration. 3. Enlarged heterogeneous thyroid. Non-emergent thyroid ultrasound is recommended if not performed previously. Electronically signed by: Norman Gatlin MD 05/15/2024 10:21 PM EST RP Workstation: [id number]   DG  Chest Port 1 View Result Date: 05/15/2024 EXAM: 1 VIEW(S) XRAY OF THE CHEST 05/15/2024 08:30:57 PM COMPARISON: 06/20/2023 CLINICAL HISTORY: Questionable sepsis - evaluate for abnormality FINDINGS: LUNGS AND PLEURA: Interval development of hazy airspace opacity within left mid lung with associated interstitial thickening in the left lung. No pleural effusion. No pneumothorax. HEART AND MEDIASTINUM: Atherosclerotic plaque noted. No acute abnormality of the cardiac and mediastinal silhouettes. BONES AND SOFT TISSUES: No acute osseous abnormality. IMPRESSION: 1. Interval development of hazy airspace opacity within the left mid lung with associated interstitial thickening. This is favored due to pneumonia however a mass is not excluded. Recommend CT for further evaluation. Electronically signed by:  Norman Gatlin MD 05/15/2024 08:36 PM EST RP Workstation: [id number]        Scheduled Meds:  calcium  carbonate  1 tablet Oral Q breakfast   dextromethorphan -guaiFENesin   1 tablet Oral BID   enoxaparin  (LOVENOX ) injection  30 mg Subcutaneous Q24H   Continuous Infusions:  ampicillin-sulbactam (UNASYN) IV 3 g (05/16/24 0347)   azithromycin  500 mg (05/16/24 1054)   lactated ringers  50 mL/hr at 05/16/24 1101          Emery Dupuy, MD [facility] 05/16/2024, 11:10 AM

## 2024-05-17 ENCOUNTER — Inpatient Hospital Stay (HOSPITAL_COMMUNITY)

## 2024-05-17 DIAGNOSIS — A419 Sepsis, unspecified organism: Secondary | ICD-10-CM | POA: Diagnosis not present

## 2024-05-17 DIAGNOSIS — J189 Pneumonia, unspecified organism: Secondary | ICD-10-CM | POA: Diagnosis not present

## 2024-05-17 LAB — BASIC METABOLIC PANEL WITH GFR
Anion gap: 12 (ref 5–15)
BUN: 17 mg/dL (ref 8–23)
CO2: 21 mmol/L — ABNORMAL LOW (ref 22–32)
Calcium: 8 mg/dL — ABNORMAL LOW (ref 8.9–10.3)
Chloride: 110 mmol/L (ref 98–111)
Creatinine, Ser: 1.38 mg/dL — ABNORMAL HIGH (ref 0.44–1.00)
GFR, Estimated: 42 mL/min — ABNORMAL LOW (ref >60)
Glucose, Bld: 53 mg/dL — ABNORMAL LOW (ref 70–99)
Potassium: 3.6 mmol/L (ref 3.5–5.1)
Sodium: 142 mmol/L (ref 135–145)

## 2024-05-17 LAB — GASTROINTESTINAL PANEL BY PCR, STOOL (REPLACES STOOL CULTURE)

## 2024-05-17 LAB — CBC WITH DIFFERENTIAL/PLATELET
Abs Immature Granulocytes: 0.05 K/uL (ref 0.00–0.07)
Basophils Absolute: 0 K/uL (ref 0.0–0.1)
Basophils Relative: 0 %
Eosinophils Absolute: 0 K/uL (ref 0.0–0.5)
Eosinophils Relative: 0 %
HCT: 30.9 % — ABNORMAL LOW (ref 36.0–46.0)
Hemoglobin: 10.1 g/dL — ABNORMAL LOW (ref 12.0–15.0)
Immature Granulocytes: 1 %
Lymphocytes Relative: 8 %
Lymphs Abs: 0.8 K/uL (ref 0.7–4.0)
MCH: 30.7 pg (ref 26.0–34.0)
MCHC: 32.7 g/dL (ref 30.0–36.0)
MCV: 93.9 fL (ref 80.0–100.0)
Monocytes Absolute: 1.4 K/uL — ABNORMAL HIGH (ref 0.1–1.0)
Monocytes Relative: 13 %
Neutro Abs: 8.4 K/uL — ABNORMAL HIGH (ref 1.7–7.7)
Neutrophils Relative %: 78 %
Platelets: 139 K/uL — ABNORMAL LOW (ref 150–400)
RBC: 3.29 MIL/uL — ABNORMAL LOW (ref 3.87–5.11)
RDW: 13.4 % (ref 11.5–15.5)
WBC: 10.7 K/uL — ABNORMAL HIGH (ref 4.0–10.5)
nRBC: 0 % (ref 0.0–0.2)

## 2024-05-17 MED ORDER — IOHEXOL 300 MG/ML  SOLN
80.0000 mL | Freq: Once | INTRAMUSCULAR | Status: AC | PRN
  Administered 2024-05-17: 80 mL via INTRAVENOUS

## 2024-05-17 MED ORDER — TICAGRELOR 90 MG PO TABS
90.0000 mg | ORAL_TABLET | Freq: Two times a day (BID) | ORAL | Status: DC
  Administered 2024-05-17 – 2024-05-20 (×7): 90 mg via ORAL
  Filled 2024-05-17 (×7): qty 1

## 2024-05-17 MED ORDER — LACTATED RINGERS IV SOLN: INTRAVENOUS | Status: AC

## 2024-05-17 NOTE — Evaluation (Signed)
 Occupational Therapy Evaluation Patient Details Name: Cassidy Larson MRN: [id number] DOB: 1955/07/18 Today's Date: 05/17/2024   History of Present Illness   Cassidy Larson is a 68 y.o. female with medical history significant of ASCVD and MI status post PCI October 2023, RA, hypertension, hyperlipidemia, CKD 3B, SLE who presents to the emergency department with complaint of 3 - 4 days of weakness, fatigue, shortness of breath, low back pain and diarrhea (multiple episodes daily).  She lives in a group home and was noted to be febrile today, so EMS was activated, on arrival of EMS team, she was noted to have an O2 sats of 90% on room air.  3 LPM of oxygen via [place] was provided she was taken to the ED for further evaluation. (per DO)     Clinical Impressions Pt agreeable to OT and PT co-evaluation. Pt required CGA to min A for bed mobility and Min A for EOB to chair transfer with RW. Max A needed for lower body ADL's at this time based on observation and clinical judgement. CGA to min A for UB dressing and bathing. Pt left in 2L supplemental O2 due to desaturation when on room air today. Pt's B UE are also weak with limited A/ROM of the shoulder. Pt left in the chair with call bell within reach. Pt will benefit from continued OT in the hospital to increase strength, balance, and endurance for safe ADL's.        If plan is discharge home, recommend the following:   A little help with walking and/or transfers;A lot of help with bathing/dressing/bathroom;Assistance with cooking/housework;Assist for transportation;Help with stairs or ramp for entrance     Functional Status Assessment   Patient has had a recent decline in their functional status and demonstrates the ability to make significant improvements in function in a reasonable and predictable amount of time.     Equipment Recommendations   None recommended by OT             Precautions/Restrictions    Precautions Precautions: Fall Recall of Precautions/Restrictions: Intact Restrictions Weight Bearing Restrictions Per Provider Order: No     Mobility Bed Mobility Overal bed mobility: Needs Assistance Bed Mobility: Supine to Sit     Supine to sit: Min assist, Contact guard     General bed mobility comments: increased time, labored movement    Transfers Overall transfer level: Needs assistance Equipment used: Rolling walker (2 wheels) Transfers: Sit to/from Stand, Bed to chair/wheelchair/BSC Sit to Stand: Min assist     Step pivot transfers: Min assist     General transfer comment: labored; EOB to chair with RW      Balance Overall balance assessment: Needs assistance Sitting-balance support: Feet supported, No upper extremity supported Sitting balance-Leahy Scale: Fair Sitting balance - Comments: seated at EOB   Standing balance support: During functional activity, Bilateral upper extremity supported Standing balance-Leahy Scale: Poor Standing balance comment: fair to poor with RW                           ADL either performed or assessed with clinical judgement   ADL Overall ADL's : Needs assistance/impaired     Grooming: Set up;Minimal assistance;Sitting   Upper Body Bathing: Contact guard assist;Minimal assistance;Sitting   Lower Body Bathing: Maximal assistance;Sitting/lateral leans;Total assistance   Upper Body Dressing : Minimal assistance;Contact guard assist;Sitting   Lower Body Dressing: Maximal assistance;Total assistance;Sitting/lateral leans;Bed level Lower Body Dressing Details (indicate cue type  and reason): Assisted to don sock at bed level today. Toilet Transfer: Minimal assistance;Stand-pivot;Ambulation;Rolling walker (2 wheels) Toilet Transfer Details (indicate cue type and reason): EOB to chair and ambulation in the hall Toileting- Clothing Manipulation and Hygiene: Maximal assistance;Total assistance;Sit to/from  stand Toileting - Clothing Manipulation Details (indicate cue type and reason): Assisted for peri-care while standing with PT due to bowel movement being noted in the bed.     Functional mobility during ADLs: Minimal assistance;Rolling walker (2 wheels);Contact guard assist General ADL Comments: Able to ambualte ~8 feet in the room going forward and backward.     Vision Baseline Vision/History: 1 Wears glasses (Pt reports she is waiting on glasses.) Ability to See in Adequate Light: 1 Impaired Patient Visual Report: No change from baseline Vision Assessment?: No apparent visual deficits     Perception Perception: Not tested       Praxis Praxis: Not tested       Pertinent Vitals/Pain Pain Assessment Pain Assessment: 0-10 Pain Score: 10-Worst pain ever Pain Location: chest Pain Descriptors / Indicators: Sharp, Other (Comment) (like something is sitting on her chest) Pain Intervention(s): Monitored during session, Repositioned, Limited activity within patient's tolerance     Extremity/Trunk Assessment Upper Extremity Assessment Upper Extremity Assessment: Generalized weakness (3-/5 shoulder flexion bilaterally; full P/ROM shoulder flexion; generally weak)   Lower Extremity Assessment Lower Extremity Assessment: Defer to PT evaluation   Cervical / Trunk Assessment Cervical / Trunk Assessment: Normal   Communication Communication Communication: No apparent difficulties   Cognition Arousal: Alert Behavior During Therapy: WFL for tasks assessed/performed Cognition: No family/caregiver present to determine baseline             OT - Cognition Comments: Pt able to answer questions regarding prior home set up, but these answers seem to differ some from the chart.                 Following commands: Intact       Cueing  General Comments   Cueing Techniques: Verbal cues;Tactile cues  Pt reported not using supplemental O2 at baseline. Pt was removed from   in the room but desaturated to ~83%. Pt placed back on 2 L and was saturating at or around 90% SpO2.              Home Living Family/patient expects to be discharged to:: Group home       Home Access: Level entry           Bathroom Shower/Tub: Chief Strategy Officer: Standard Bathroom Accessibility: Yes How Accessible: Accessible via wheelchair;Accessible via walker Home Equipment: Cane - single point   Additional Comments: Pt reported living at an ALF but social work confirmed the pt lives at a group home.      Prior Functioning/Environment Prior Level of Function : Needs assist       Physical Assist : ADLs (physical)   ADLs (physical): IADLs Mobility Comments: household ambulation using SPC PRN ADLs Comments: Pt reports independence with ADL's ; IADL assist    OT Problem List: Decreased strength;Decreased range of motion;Decreased activity tolerance;Impaired balance (sitting and/or standing);Pain   OT Treatment/Interventions: Self-care/ADL training;Therapeutic exercise;Therapeutic activities;Patient/family education;Balance training;DME and/or AE instruction      OT Goals(Current goals can be found in the care plan section)   Acute Rehab OT Goals Patient Stated Goal: Improve function. OT Goal Formulation: With patient Time For Goal Achievement: 05/31/24 Potential to Achieve Goals: Good   OT Frequency:  Min 3X/week  Co-evaluation PT/OT/SLP Co-Evaluation/Treatment: Yes Reason for Co-Treatment: To address functional/ADL transfers   OT goals addressed during session: ADL's and self-care                       End of Session Equipment Utilized During Treatment: Rolling walker (2 wheels);Oxygen  Activity Tolerance: Patient tolerated treatment well Patient left: in chair;with call bell/phone within reach  OT Visit Diagnosis: Unsteadiness on feet (R26.81);Other abnormalities of gait and mobility (R26.89);Muscle weakness (generalized)  (M62.81)                Time: 8966-8898 OT Time Calculation (min): 28 min Charges:  OT General Charges $OT Visit: 1 Visit OT Evaluation $OT Eval Low Complexity: 1 Low  Darby Shadwick OT, MOT  Jayson Person 05/17/2024, 3:03 PM

## 2024-05-17 NOTE — Plan of Care (Signed)
  Problem: Acute Rehab PT Goals(only PT should resolve) Goal: Pt Will Go Supine/Side To Sit Outcome: Progressing Flowsheets (Taken 05/17/2024 1435) Pt will go Supine/Side to Sit: with supervision Goal: Patient Will Transfer Sit To/From Stand Outcome: Progressing Flowsheets (Taken 05/17/2024 1435) Patient will transfer sit to/from stand:  with contact guard assist  with minimal assist Goal: Pt Will Transfer Bed To Chair/Chair To Bed Outcome: Progressing Flowsheets (Taken 05/17/2024 1435) Pt will Transfer Bed to Chair/Chair to Bed:  with contact guard assist  with min assist Goal: Pt Will Ambulate Outcome: Progressing Flowsheets (Taken 05/17/2024 1435) Pt will Ambulate:  25 feet  with minimal assist  with contact guard assist  with rolling walker  with cane   2:36 PM, 05/17/24 Lynwood Music, MPT Physical Therapist with [facility] [phone] office [phone] mobile phone

## 2024-05-17 NOTE — Progress Notes (Signed)
 Referred to patient via nursing that patient wanted prayer. Unable to visit patient due to lengthy PT evaluation today. Will round at a later time.   Ether Blush, Mdiv Chaplain

## 2024-05-17 NOTE — Evaluation (Signed)
 Clinical/Bedside Swallow Evaluation Patient Details  Name: Cassidy Larson MRN: [id number] Date of Birth: 06/14/1956  Today's Date: 05/17/2024 Time: SLP Start Time (ACUTE ONLY): 1502 SLP Stop Time (ACUTE ONLY): 1528 SLP Time Calculation (min) (ACUTE ONLY): 26 min  Past Medical History:  Past Medical History:  Diagnosis Date   Anxiety    Blood transfusion without reported diagnosis    CAD in native artery residual post LAD stent, 100% RCA and 80% LCx.  03/30/2022   CHF (congestive heart failure) (HCC)    CKD (chronic kidney disease) stage 4, GFR 15-29 ml/min (HCC)    Cocaine abuse (HCC)    Essential hypertension    HLD (hyperlipidemia) 03/30/2022   Lupus    On mechanically assisted ventilation (HCC) extubated 03/28/22  03/28/2022   Polysubstance abuse (HCC)    Positive ANA (antinuclear antibody)    S/P angioplasty with stent to mLAD 03/27/22 03/30/2022   Past Surgical History:  Past Surgical History:  Procedure Laterality Date   CORONARY/GRAFT ACUTE MI REVASCULARIZATION N/A 03/27/2022   Procedure: Coronary/Graft Acute MI Revascularization;  Surgeon: Verlin Lonni BIRCH, MD;  Location: [facility] INVASIVE [facility];  Service: Cardiovascular;  Laterality: N/A;   LEFT HEART CATH AND CORONARY ANGIOGRAPHY N/A 03/27/2022   Procedure: LEFT HEART CATH AND CORONARY ANGIOGRAPHY;  Surgeon: Verlin Lonni BIRCH, MD;  Location: [facility] INVASIVE CV LAB;  Service: Cardiovascular;  Laterality: N/A;   HPI:  Cassidy Larson is a 68 y.o. female with medical history significant of ASCVD and MI status post PCI October 2023, RA, hypertension, hyperlipidemia, CKD 3B, SLE who presents to the emergency department with complaint of 3 - 4 days of weakness, fatigue, shortness of breath, low back pain and diarrhea (multiple episodes daily).  She lives in a group home and was noted to be febrile today, so EMS was activated, on arrival of EMS team, she was noted to have an O2 sats of 90% on room air.  3 LPM of oxygen via  [place] was provided she was taken to the ED for further evaluation. Patient met sepsis criteria due to being febrile and having leukocytosis (met sepsis criteria).  Chest x-ray and CT of chest were suggestive of pneumonia.  Left upper lobe consolidation versus malignancy.  Needs follow-up CT scan  CT of chest was also suggestive of possible aspiration. BSE requested.    Assessment / Plan / Recommendation  Clinical Impression  Clinical swallow evaluation completed at bedside. Pt is edentulous and typically eats soft foods and denies difficulty swallowing. OME is WNL. Pt assessed with ice chips, thin water via cup/straw, puree, and mixed fruit. Pt does not exhibit signs or symptoms of aspiration and no reports of globus. Recommend mechanical soft textures and thin liquids via cup/straw and PO medication whole with water. Pt to adhere to standard aspiration and reflux precautions (sit fully upright for all PO and remain upright for 30+ minutes after). No further SLP services indicated at this time. SLP Visit Diagnosis: Dysphagia, unspecified (R13.10)    Aspiration Risk  No limitations    Diet Recommendation Dysphagia 3 (Mech soft);Thin liquid    Liquid Administration via: Cup;Straw Medication Administration: Whole meds with liquid Supervision: Patient able to self feed Postural Changes: Seated upright at 90 degrees;Remain upright for at least 30 minutes after po intake    Other  Recommendations Oral Care Recommendations: Oral care BID     Assistance Recommended at Discharge    Functional Status Assessment Patient has not had a recent decline in their functional status  Frequency and Duration            Prognosis Prognosis for improved oropharyngeal function: Good      Swallow Study   General Date of Onset: 05/15/24 HPI: Cassidy Larson is a 68 y.o. female with medical history significant of ASCVD and MI status post PCI October 2023, RA, hypertension, hyperlipidemia, CKD 3B, SLE who  presents to the emergency department with complaint of 3 - 4 days of weakness, fatigue, shortness of breath, low back pain and diarrhea (multiple episodes daily).  She lives in a group home and was noted to be febrile today, so EMS was activated, on arrival of EMS team, she was noted to have an O2 sats of 90% on room air.  3 LPM of oxygen via [place] was provided she was taken to the ED for further evaluation. Patient met sepsis criteria due to being febrile and having leukocytosis (met sepsis criteria).  Chest x-ray and CT of chest were suggestive of pneumonia.  Left upper lobe consolidation versus malignancy.  Needs follow-up CT scan  CT of chest was also suggestive of possible aspiration. BSE requested. Type of Study: Bedside Swallow Evaluation Previous Swallow Assessment: N/A Diet Prior to this Study: NPO Temperature Spikes Noted: No Respiratory Status: Nasal cannula History of Recent Intubation: No Behavior/Cognition: Alert;Cooperative;Pleasant mood Oral Cavity Assessment: Within Functional Limits Oral Care Completed by SLP: Yes Oral Cavity - Dentition: Edentulous Vision: Functional for self-feeding Self-Feeding Abilities: Able to feed self Patient Positioning: Upright in bed Baseline Vocal Quality: Normal Volitional Cough: Strong;Congested Volitional Swallow: Able to elicit    Oral/Motor/Sensory Function Overall Oral Motor/Sensory Function: Within functional limits   Ice Chips Ice chips: Within functional limits Presentation: Spoon   Thin Liquid Thin Liquid: Within functional limits Presentation: Cup;Self Fed;Straw    Nectar Thick Nectar Thick Liquid: Not tested   Honey Thick Honey Thick Liquid: Not tested   Puree Puree: Within functional limits Presentation: Spoon;Self Fed   Solid     Solid: Within functional limits Presentation: Self Fed     Thank you,  Lamar Candy, CCC-SLP [phone]  Deonna Krummel 05/17/2024,4:08 PM

## 2024-05-17 NOTE — Progress Notes (Signed)
 Mobility Specialist Progress Note:    05/17/24 1130  Mobility  Activity Ambulated with assistance  Level of Assistance Contact guard assist, steadying assist  Assistive Device Front wheel walker  Distance Ambulated (ft) 10 ft  Range of Motion/Exercises Active;All extremities  Activity Response Tolerated well  Mobility Referral Yes  Mobility visit 1 Mobility  Mobility Specialist Start Time (ACUTE ONLY) 1130  Mobility Specialist Stop Time (ACUTE ONLY) 1142  Mobility Specialist Time Calculation (min) (ACUTE ONLY) 12 min   Pt received in chair, requesting assistance to bathroom. Required CGA to stand and ambulate with RW. Tolerated well, left in bathroom and notified pt to pull string when finished. All needs met.  Tory Mckissack Mobility Specialist Please contact via Special Educational Needs Teacher or  Rehab office at [phone]

## 2024-05-17 NOTE — Evaluation (Signed)
 Physical Therapy Evaluation Patient Details Name: Cassidy Larson MRN: [id number] DOB: August 26, 1955 Today's Date: 05/17/2024  History of Present Illness  Cassidy Larson is a 68 y.o. female with medical history significant of ASCVD and MI status post PCI October 2023, RA, hypertension, hyperlipidemia, CKD 3B, SLE who presents to the emergency department with complaint of 3 - 4 days of weakness, fatigue, shortness of breath, low back pain and diarrhea (multiple episodes daily).  She lives in a group home and was noted to be febrile today, so EMS was activated, on arrival of EMS team, she was noted to have an O2 sats of 90% on room air.  3 LPM of oxygen via [place] was provided she was taken to the ED for further evaluation.   Clinical Impression  Patient demonstrates slow labored movement for sitting up at bedside requiring frequent rest breaks due to fatigue, unsteady on feet and limited to a few steps at bedside before requesting to sit due to c/o SOB, generalized weakness with SpO2 dropping from 93% to 85% while on room air and put back on 3 LPM O2 while sitting in chair. Patient will benefit from continued skilled physical therapy in hospital and recommended venue below to increase strength, balance, endurance for safe ADLs and gait.          If plan is discharge home, recommend the following: A lot of help with walking and/or transfers;A lot of help with bathing/dressing/bathroom;Help with stairs or ramp for entrance;Assist for transportation;Assistance with cooking/housework   Can travel by private vehicle   Yes    Equipment Recommendations Rolling walker (2 wheels)  Recommendations for Other Services       Functional Status Assessment Patient has had a recent decline in their functional status and demonstrates the ability to make significant improvements in function in a reasonable and predictable amount of time.     Precautions / Restrictions Precautions Precautions: Fall Recall of  Precautions/Restrictions: Intact Restrictions Weight Bearing Restrictions Per Provider Order: No      Mobility  Bed Mobility Overal bed mobility: Needs Assistance Bed Mobility: Supine to Sit     Supine to sit: Contact guard, Min assist     General bed mobility comments: increased time, labored movement, frequent rest breaks due to SOB    Transfers Overall transfer level: Needs assistance Equipment used: Rolling walker (2 wheels) Transfers: Sit to/from Stand, Bed to chair/wheelchair/BSC Sit to Stand: Min assist   Step pivot transfers: Min assist       General transfer comment: unsteady labored movement    Ambulation/Gait Ambulation/Gait assistance: Min assist, Mod assist Gait Distance (Feet): 8 Feet Assistive device: Rolling walker (2 wheels) Gait Pattern/deviations: Decreased step length - right, Decreased step length - left, Decreased stride length Gait velocity: slow     General Gait Details: limited to a few steps forward/backwards at bedside before having to sit due to c/o fatigue and SOB, on room air with SpO2 dropping from 93% to 84%  Stairs            Wheelchair Mobility     Tilt Bed    Modified Rankin (Stroke Patients Only)       Balance Overall balance assessment: Needs assistance Sitting-balance support: Feet supported, No upper extremity supported Sitting balance-Leahy Scale: Fair Sitting balance - Comments: seated at EOB   Standing balance support: During functional activity, Bilateral upper extremity supported Standing balance-Leahy Scale: Poor Standing balance comment: fair/poor using RW  Pertinent Vitals/Pain Pain Assessment Pain Assessment: No/denies pain    Home Living Family/patient expects to be discharged to:: Group home                 Home Equipment: [place] - single point      Prior Function Prior Level of Function : Needs assist             Mobility Comments:  household ambulation using SPC ADLs Comments: Assisted by group home staff     Extremity/Trunk Assessment   Upper Extremity Assessment Upper Extremity Assessment: Defer to OT evaluation    Lower Extremity Assessment Lower Extremity Assessment: Generalized weakness    Cervical / Trunk Assessment Cervical / Trunk Assessment: Normal  Communication   Communication Communication: No apparent difficulties    Cognition Arousal: Alert Behavior During Therapy: WFL for tasks assessed/performed   PT - Cognitive impairments: No apparent impairments                         Following commands: Intact       Cueing Cueing Techniques: Verbal cues, Tactile cues     General Comments      Exercises     Assessment/Plan    PT Assessment Patient needs continued PT services  PT Problem List Decreased strength;Decreased activity tolerance;Decreased mobility;Decreased balance;Cardiopulmonary status limiting activity       PT Treatment Interventions DME instruction;Gait training;Stair training;Functional mobility training;Therapeutic activities;Therapeutic exercise;Balance training;Patient/family education    PT Goals (Current goals can be found in the Care Plan section)  Acute Rehab PT Goals Patient Stated Goal: return home with group home staff to assist PT Goal Formulation: With patient Time For Goal Achievement: 05/31/24 Potential to Achieve Goals: Good    Frequency Min 3X/week     Co-evaluation               AM-PAC PT 6 Clicks Mobility  Outcome Measure Help needed turning from your back to your side while in a flat bed without using bedrails?: A Little Help needed moving from lying on your back to sitting on the side of a flat bed without using bedrails?: A Little Help needed moving to and from a bed to a chair (including a wheelchair)?: A Little Help needed standing up from a chair using your arms (e.g., wheelchair or bedside chair)?: A Little Help  needed to walk in hospital room?: A Lot Help needed climbing 3-5 steps with a railing? : A Lot 6 Click Score: 16    End of Session Equipment Utilized During Treatment: Oxygen Activity Tolerance: Patient tolerated treatment well;Patient limited by fatigue Patient left: in chair;with call bell/phone within reach;with chair alarm set Nurse Communication: Mobility status PT Visit Diagnosis: Unsteadiness on feet (R26.81);Other abnormalities of gait and mobility (R26.89);Muscle weakness (generalized) (M62.81)    Time: 8966-8896 PT Time Calculation (min) (ACUTE ONLY): 30 min   Charges:   PT Evaluation $PT Eval Moderate Complexity: 1 Mod PT Treatments $Therapeutic Activity: 23-37 mins PT General Charges $$ ACUTE PT VISIT: 1 Visit         2:33 PM, 05/17/24 Lynwood Music, MPT Physical Therapist with [facility] [phone] office [phone] mobile phone

## 2024-05-17 NOTE — TOC Progression Note (Signed)
 Transition of Care [facility]) - Progression Note    Patient Details  Name: Adalyne Lovick MRN: [id number] Date of Birth: 1955-10-06  Transition of [facility] CM/SW Contact  Lucie Lunger, [place] Phone Number: 05/17/2024, 1:16 PM  Clinical Narrative:    CSW updated that PT is recommending SNF if pts group home cannot handle some min to mod assistance. CSW spoke to [place] with the group home. Randie states she will assess pt this afternoon after 3pm to see if they can still manage pts level of care. TOC to follow.     Barriers to Discharge: Continued Medical Work up               Expected Discharge Plan and Services                                               Social Drivers of Health (SDOH) Interventions SDOH Screenings   Food Insecurity: No Food Insecurity (05/15/2024)  Housing: Low Risk  (05/15/2024)  Transportation Needs: Unmet Transportation Needs (05/15/2024)  Utilities: Not At Risk (05/15/2024)  Depression (PHQ2-9): Low Risk  (03/09/2019)  Financial Resource Strain: Not on File (11/14/2022)   Received [facility]  Physical Activity: Not on File (11/14/2022)   Received from [facility]  Social Connections: Socially Isolated (05/15/2024)  Stress: Not on File (11/14/2022)   Received from [facility]  Tobacco Use: High Risk (05/15/2024)    Readmission Risk Interventions    05/16/2024    2:59 PM  Readmission Risk Prevention Plan  Transportation Screening Complete  PCP or Specialist Appt within 5-7 Days Not Complete  Home Care Screening Complete  Medication Review (RN CM) Complete

## 2024-05-17 NOTE — Progress Notes (Signed)
 PROGRESS NOTE    Cassidy Larson  FMW:[id number] DOB: 1955-07-24 DOA: 05/15/2024 PCP: [name], FNP   Brief Narrative:    Cassidy Larson is a 68 y.o. female with medical history significant of ASCVD and MI status post PCI October 2023, RA, hypertension, hyperlipidemia, CKD 3B, SLE who presents to the emergency department with complaint of 3 - 4 days of weakness, fatigue, shortness of breath, low back pain and diarrhea (multiple episodes daily).  She lives in a group home and was noted to be febrile today, so EMS was activated, on arrival of EMS team, she was noted to have an O2 sats of 90% on room air.  3 LPM of oxygen via [place] was provided she was taken to the ED for further evaluation.     ED course In the emergency department, she was febrile with a temperature of 102.4F, other vital signs are within normal range.  Workup in the ED showed normal CBC except for WBC of 13.1 and hemoglobin of 11.9.  BMP was normal except for potassium of 3.3, bicarb 18, creatinine 1.79 (baseline creatinine at 1.1-1.4) and calcium  8.8.  Lactic acid 0.9.  Influenza A, B, SARS, RSV, RSV was negative. Chest x-ray showed interval  development of hazy airspace opacity within the left mid lung with associated interstitial thickening. This is favored due to pneumonia. EKG personally reviewed showed normal sinus rhythm at a rate of 80 bpm with QTc of 580 ms She was treated with IV ceftriaxone  and azithromycin , Tylenol  was given due to fever, IV hydration per sepsis protocol was provided.  Since admission, patient was initiated on IV Unasyn for aspiration pneumonia.  Continued on IV fluid.  C. difficile testing negative   Assessment & Plan:   Principal Problem:   Sepsis due to pneumonia (HCC)   Sepsis due to pneumonia Concern for aspiration pneumonia  Patient met sepsis criteria due to being febrile and having leukocytosis. Chest x-ray and CT of chest were suggestive of pneumonia.   Left upper lobe  consolidation versus malignancy.  Needs follow-up CT scan as an outpatient CT of chest was also suggestive of possible aspiration Continue IV Unasyn, azithromycin , blood cultures negative x 48 hours Fever curve improving.  Last temperature 102 F on 05/16/2024 3 PM continue Tylenol  as needed Continue Mucinex , incentive spirometry, flutter valve SLP eval for dysphagia is pending   Acute diarrhea C. difficile if negative, GI panel is negative too. Will obtain CT with contrast given ongoing pain and diarrhea in the setting of sepsis   Hypokalemia K+ 3.3, replaced. Repeat potassium today is 3.6.   Hypocalcemia Calcium  8.8, continue Os-Cal   Acute kidney injury superimposed on CKD 3B Creatinine 1.79 (baseline creatinine at 1.1-1.4) Creatinine has essentially returned to baseline with IV hydration. Will continue IV fluid given contrast.  Chest pain: Likely secondary to pneumonia.  Will obtain EKG.   Prolonged QT interval QTc 580 ms Avoid QT prolonging drugs Magnesium  level will be checked Repeat EKG    Essential hypertension BP meds will be held at this time due to soft BP.  Will resume gradually.   Mixed hyperlipidemia Continue Lipitor    CAD s/p PCI Continue Brilinta , Lipitor  Continue Imdur , continue to hold Coreg  due to softer blood pressure.      Advance Care Planning: Full code   Consults: None   Family Communication: None at bedside   Severity of Illness: The appropriate patient status for this patient is INPATIENT. Inpatient status is judged to be reasonable and  necessary in order to provide the required intensity of service to ensure the patient's safety. The patient's presenting symptoms, physical exam findings, and initial radiographic and laboratory data in the context of their chronic comorbidities is felt to place them at high risk for further clinical deterioration. Furthermore, it is not anticipated that the patient will be medically stable for discharge  from the hospital within 2 midnights of admission.    * I certify that at the point of admission it is my clinical judgment that the patient will require inpatient hospital care spanning beyond 2 midnights from the point of admission due to high intensity of service, high risk for further deterioration and high frequency of surveillance required.*   Subjective:  Patient seen and examined at the bedside.  She is alert and oriented.  Vital signs are stable.  Fever curve improving.  Last fever about 24 hours ago.  Patient does however complain of loose stool as well as abdominal pain as well as chest pain .  SLP eval pending.  Objective: Vitals:   05/16/24 1516 05/16/24 1700 05/16/24 2138 05/17/24 1334  BP: 124/60  123/72 (!) 125/55  Pulse: 79  72 78  Resp: 18     Temp: (!) 100.5 F (38.1 C) 99 F (37.2 C) 98.7 F (37.1 C) 98.3 F (36.8 C)  TempSrc: Oral Oral Oral Oral  SpO2: 97%  96% 92%  Weight:      Height:        Intake/Output Summary (Last 24 hours) at 05/17/2024 1520 Last data filed at 05/17/2024 0848 Gross per 24 hour  Intake 573.33 ml  Output 500 ml  Net 73.33 ml   Filed Weights   05/15/24 1949 05/15/24 1957 05/15/24 2224  Weight: 88.9 kg 49.9 kg 45.2 kg    Examination:  General: Chronically ill looking, not in any acute distress HEENT: Dry oral mucosa Chest: Mild crackles bilaterally, no wheezing CVS: S1, S2, no murmur, regular rhythm Abdomen: Soft, mild diffuse tenderness, no rebound tenderness: Bowel sounds present extremities: No edema Neuro: No focal deficits   Data Reviewed: I have personally reviewed following labs and imaging studies  CBC: Recent Labs  Lab 05/15/24 2024 05/16/24 0607 05/17/24 0453  WBC 13.1* 15.2* 10.7*  NEUTROABS 10.3*  --  8.4*  HGB 11.9* 11.1* 10.1*  HCT 36.2 33.5* 30.9*  MCV 93.5 93.6 93.9  PLT 160 147* 139*   Basic Metabolic Panel: Recent Labs  Lab 05/15/24 2024 05/16/24 0607 05/17/24 0453  NA 137 139 142  K 3.3*  3.5 3.6  CL 103 105 110  CO2 18* 20* 21*  GLUCOSE 99 88 53*  BUN 21 18 17   CREATININE 1.79* 1.59* 1.38*  CALCIUM  8.8* 8.4* 8.0*  MG  --  1.9  --   PHOS  --  2.8  --    GFR: Estimated Creatinine Clearance: 28.6 mL/min (A) (by C-G formula based on SCr of 1.38 mg/dL (H)). Liver Function Tests: Recent Labs  Lab 05/15/24 2024 05/16/24 0607  AST 26 35  ALT 22 28  ALKPHOS 80 84  BILITOT 0.5 0.4  PROT 8.1 7.2  ALBUMIN 4.0 3.5   No results for input(s): LIPASE, AMYLASE in the last 168 hours. No results for input(s): AMMONIA in the last 168 hours. Coagulation Profile: Recent Labs  Lab 05/15/24 2024  INR 1.2   Cardiac Enzymes: No results for input(s): CKTOTAL, CKMB, CKMBINDEX, TROPONINI in the last 168 hours. BNP (last 3 results) No results for input(s): PROBNP in  the last 8760 hours. HbA1C: No results for input(s): HGBA1C in the last 72 hours. CBG: Recent Labs  Lab 05/15/24 2253  GLUCAP 105*   Lipid Profile: No results for input(s): CHOL, HDL, LDLCALC, TRIG, CHOLHDL, LDLDIRECT in the last 72 hours. Thyroid Function Tests: No results for input(s): TSH, T4TOTAL, FREET4, T3FREE, THYROIDAB in the last 72 hours. Anemia Panel: No results for input(s): VITAMINB12, FOLATE, FERRITIN, TIBC, IRON, RETICCTPCT in the last 72 hours. Sepsis Labs: Recent Labs  Lab 05/15/24 2024 05/16/24 0541  PROCALCITON  --  1.12  LATICACIDVEN 0.9  --     Recent Results (from the past 240 hours)  Blood Culture (routine x 2)     Status: None (Preliminary result)   Collection Time: 05/15/24  8:24 PM   Specimen: BLOOD LEFT WRIST  Result Value Ref Range Status   Specimen Description BLOOD LEFT WRIST  Final   Special Requests   Final    BOTTLES DRAWN AEROBIC AND ANAEROBIC Blood Culture adequate volume   Culture   Final    NO GROWTH 2 DAYS Performed at [facility], [address] [place], [place] [address]    Report Status PENDING   Incomplete  Resp panel by RT-PCR (RSV, Flu A&B, Covid) Anterior Nasal Swab     Status: None   Collection Time: 05/15/24  8:38 PM   Specimen: Anterior Nasal Swab  Result Value Ref Range Status   SARS Coronavirus 2 by RT PCR NEGATIVE NEGATIVE Final    Comment: (NOTE) SARS-CoV-2 target nucleic acids are NOT DETECTED.  The SARS-CoV-2 RNA is generally detectable in upper respiratory specimens during the acute phase of infection. The lowest concentration of SARS-CoV-2 viral copies this assay can detect is 138 copies/mL. A negative result does not preclude SARS-Cov-2 infection and should not be used as the sole basis for treatment or other patient management decisions. A negative result may occur with  improper specimen collection/handling, submission of specimen other than nasopharyngeal swab, presence of viral mutation(s) within the areas targeted by this assay, and inadequate number of viral copies(<138 copies/mL). A negative result must be combined with clinical observations, patient history, and epidemiological information. The expected result is Negative.  Fact Sheet for Patients:  bloggercourse.com  Fact Sheet for Healthcare Providers:  seriousbroker.it  This test is no t yet approved or cleared by the [place]  FDA and  has been authorized for detection and/or diagnosis of SARS-CoV-2 by FDA under an Emergency Use Authorization (EUA). This EUA will remain  in effect (meaning this test can be used) for the duration of the COVID-19 declaration under Section 564(b)(1) of the Act, 21 U.S.C.section 360bbb-3(b)(1), unless the authorization is terminated  or revoked sooner.       Influenza A by PCR NEGATIVE NEGATIVE Final   Influenza B by PCR NEGATIVE NEGATIVE Final    Comment: (NOTE) The Xpert Xpress SARS-CoV-2/FLU/RSV plus assay is intended as an aid in the diagnosis of influenza from Nasopharyngeal swab specimens and should  not be used as a sole basis for treatment. Nasal washings and aspirates are unacceptable for Xpert Xpress SARS-CoV-2/FLU/RSV testing.  Fact Sheet for Patients: bloggercourse.com  Fact Sheet for Healthcare Providers: seriousbroker.it  This test is not yet approved or cleared by the [place]  FDA and has been authorized for detection and/or diagnosis of SARS-CoV-2 by FDA under an Emergency Use Authorization (EUA). This EUA will remain in effect (meaning this test can be used) for the duration of the COVID-19 declaration under Section 564(b)(1) of  the Act, 21 U.S.C. section 360bbb-3(b)(1), unless the authorization is terminated or revoked.     Resp Syncytial Virus by PCR NEGATIVE NEGATIVE Final    Comment: (NOTE) Fact Sheet for Patients: bloggercourse.com  Fact Sheet for Healthcare Providers: seriousbroker.it  This test is not yet approved or cleared by the [place]  FDA and has been authorized for detection and/or diagnosis of SARS-CoV-2 by FDA under an Emergency Use Authorization (EUA). This EUA will remain in effect (meaning this test can be used) for the duration of the COVID-19 declaration under Section 564(b)(1) of the Act, 21 U.S.C. section 360bbb-3(b)(1), unless the authorization is terminated or revoked.  Performed at [facility], [address], [place], [place] [address]   Blood Culture (routine x 2)     Status: None (Preliminary result)   Collection Time: 05/15/24  8:38 PM   Specimen: Right Antecubital; Blood  Result Value Ref Range Status   Specimen Description RIGHT ANTECUBITAL  Final   Special Requests   Final    BOTTLES DRAWN AEROBIC ONLY Blood Culture adequate volume   Culture   Final    NO GROWTH 2 DAYS Performed at [facility], [address] [place], [place] [address]    Report Status PENDING  Incomplete  C Difficile Quick Screen w PCR reflex      Status: None   Collection Time: 05/16/24  3:14 AM   Specimen: STOOL  Result Value Ref Range Status   C Diff antigen NEGATIVE NEGATIVE Final   C Diff toxin NEGATIVE NEGATIVE Final   C Diff interpretation No C. difficile detected.  Final    Comment: Performed at [facility], [address] [place], [place] [address]  Gastrointestinal Panel by PCR , Stool     Status: None   Collection Time: 05/16/24  2:40 PM   Specimen: Stool  Result Value Ref Range Status   Campylobacter species NOT DETECTED NOT DETECTED Final   Plesimonas shigelloides NOT DETECTED NOT DETECTED Final   Salmonella species NOT DETECTED NOT DETECTED Final   Yersinia enterocolitica NOT DETECTED NOT DETECTED Final   Vibrio species NOT DETECTED NOT DETECTED Final   Vibrio cholerae NOT DETECTED NOT DETECTED Final   Enteroaggregative E coli (EAEC) NOT DETECTED NOT DETECTED Final   Enteropathogenic E coli (EPEC) NOT DETECTED NOT DETECTED Final   Enterotoxigenic E coli (ETEC) NOT DETECTED NOT DETECTED Final   Shiga like toxin producing E coli (STEC) NOT DETECTED NOT DETECTED Final   Shigella/Enteroinvasive E coli (EIEC) NOT DETECTED NOT DETECTED Final   Cryptosporidium NOT DETECTED NOT DETECTED Final   Cyclospora cayetanensis NOT DETECTED NOT DETECTED Final   Entamoeba histolytica NOT DETECTED NOT DETECTED Final   Giardia lamblia NOT DETECTED NOT DETECTED Final   Adenovirus F40/41 NOT DETECTED NOT DETECTED Final   Astrovirus NOT DETECTED NOT DETECTED Final   Norovirus GI/GII NOT DETECTED NOT DETECTED Final   Rotavirus A NOT DETECTED NOT DETECTED Final   Sapovirus (I, II, IV, and V) NOT DETECTED NOT DETECTED Final    Comment: Performed at [facility], [address], [place], [place] [address]         Radiology Studies: CT CHEST WO CONTRAST Result Date: 05/15/2024 EXAM: CT CHEST WITHOUT CONTRAST 05/15/2024 10:07:13 PM TECHNIQUE: CT of the chest was performed without the administration of intravenous contrast.  Multiplanar reformatted images are provided for review. Automated exposure control, iterative reconstruction, and/or weight based adjustment of the mA/kV was utilized to reduce the radiation dose to as low  as reasonably achievable. COMPARISON: Comparison with prior CT dated 06/20/2023 and same day x-ray. CLINICAL HISTORY: Abnormal xray - lung opacity/opacities. FINDINGS: MEDIASTINUM: Heart and pericardium are unremarkable. Coronary artery and aortic atherosclerotic calcifications. Enlarged heterogeneous thyroid, 2 cm more than prior. Debris in the trachea extending into the bilateral mainstem bronchi. LYMPH NODES: No mediastinal, hilar or axillary lymphadenopathy. LUNGS AND PLEURA: Centrilobular and paraseptal emphysema. Irregular consolidation and associated interlobular septal thickening in the left upper lobe. The solid area of consolidation measures 2.9 x 2.3 cm on series 2 image 57. Bibasilar atelectasis/scarring. No pleural effusion or pneumothorax. SOFT TISSUES/BONES: No acute abnormality of the bones or soft tissues. UPPER ABDOMEN: Limited images of the upper abdomen demonstrates no acute abnormality. IMPRESSION: 1. Irregular left upper lobe consolidation with associated interlobular septal thickening measuring 2.9 x 2.3 cm. Primary differential considerations are pneumonia versus malignancy. Recommend short-interval non-contrast chest CT in 6-8 weeks to ensure resolution after antibiotic treatment. 2. Debris within the trachea extending into both mainstem bronchi, which may reflect aspiration. 3. Enlarged heterogeneous thyroid. Non-emergent thyroid ultrasound is recommended if not performed previously. Electronically signed by: Norman Gatlin MD 05/15/2024 10:21 PM EST RP Workstation: [id number]   DG Chest Port 1 View Result Date: 05/15/2024 EXAM: 1 VIEW(S) XRAY OF THE CHEST 05/15/2024 08:30:57 PM COMPARISON: 06/20/2023 CLINICAL HISTORY: Questionable sepsis - evaluate for abnormality FINDINGS: LUNGS AND  PLEURA: Interval development of hazy airspace opacity within left mid lung with associated interstitial thickening in the left lung. No pleural effusion. No pneumothorax. HEART AND MEDIASTINUM: Atherosclerotic plaque noted. No acute abnormality of the cardiac and mediastinal silhouettes. BONES AND SOFT TISSUES: No acute osseous abnormality. IMPRESSION: 1. Interval development of hazy airspace opacity within the left mid lung with associated interstitial thickening. This is favored due to pneumonia however a mass is not excluded. Recommend CT for further evaluation. Electronically signed by: Norman Gatlin MD 05/15/2024 08:36 PM EST RP Workstation: [id number]        Scheduled Meds:  atorvastatin   40 mg Oral Daily   calcium  carbonate  1 tablet Oral Q breakfast   carvedilol   6.25 mg Oral BID WC   dextromethorphan -guaiFENesin   1 tablet Oral BID   enoxaparin  (LOVENOX ) injection  30 mg Subcutaneous Q24H   isosorbide  mononitrate  30 mg Oral Daily   mirtazapine  7.5 mg Oral QHS   sertraline   100 mg Oral Daily   ticagrelor   90 mg Oral BID   Continuous Infusions:  ampicillin-sulbactam (UNASYN) IV 3 g (05/17/24 0439)   azithromycin  500 mg (05/17/24 0946)   lactated ringers  75 mL/hr at 05/17/24 1220          Derryl Duval, MD [facility] 05/17/2024, 3:20 PM

## 2024-05-17 NOTE — Plan of Care (Signed)
  Problem: Acute Rehab OT Goals (only OT should resolve) Goal: Pt. Will Perform Grooming Flowsheets (Taken 05/17/2024 1506) Pt Will Perform Grooming:  with modified independence  standing Goal: Pt. Will Perform Upper Body Bathing Flowsheets (Taken 05/17/2024 1506) Pt Will Perform Upper Body Bathing:  with modified independence  sitting Goal: Pt. Will Perform Upper Body Dressing Flowsheets (Taken 05/17/2024 1506) Pt Will Perform Upper Body Dressing: with modified independence Goal: Pt. Will Perform Lower Body Dressing Flowsheets (Taken 05/17/2024 1506) Pt Will Perform Lower Body Dressing: with modified independence Goal: Pt. Will Transfer To Toilet Flowsheets (Taken 05/17/2024 1506) Pt Will Transfer to Toilet:  with modified independence  ambulating Goal: Pt. Will Perform Toileting-Clothing Manipulation Flowsheets (Taken 05/17/2024 1506) Pt Will Perform Toileting - Clothing Manipulation and hygiene: with modified independence Goal: Pt/Caregiver Will Perform Home Exercise Program Flowsheets (Taken 05/17/2024 1506) Pt/caregiver will Perform Home Exercise Program:  Increased ROM  Increased strength  Both right and left upper extremity  Independently  Naveh Rickles OT, MOT

## 2024-05-18 DIAGNOSIS — J189 Pneumonia, unspecified organism: Secondary | ICD-10-CM | POA: Diagnosis not present

## 2024-05-18 DIAGNOSIS — A419 Sepsis, unspecified organism: Secondary | ICD-10-CM | POA: Diagnosis not present

## 2024-05-18 LAB — BASIC METABOLIC PANEL WITH GFR
Anion gap: 12 (ref 5–15)
BUN: 15 mg/dL (ref 8–23)
CO2: 20 mmol/L — ABNORMAL LOW (ref 22–32)
Calcium: 7.9 mg/dL — ABNORMAL LOW (ref 8.9–10.3)
Chloride: 107 mmol/L (ref 98–111)
Creatinine, Ser: 1.35 mg/dL — ABNORMAL HIGH (ref 0.44–1.00)
GFR, Estimated: 43 mL/min — ABNORMAL LOW (ref >60)
Glucose, Bld: 66 mg/dL — ABNORMAL LOW (ref 70–99)
Potassium: 3.4 mmol/L — ABNORMAL LOW (ref 3.5–5.1)
Sodium: 139 mmol/L (ref 135–145)

## 2024-05-18 LAB — CBC WITH DIFFERENTIAL/PLATELET
Abs Immature Granulocytes: 0.04 K/uL (ref 0.00–0.07)
Basophils Absolute: 0 K/uL (ref 0.0–0.1)
Basophils Relative: 0 %
Eosinophils Absolute: 0 K/uL (ref 0.0–0.5)
Eosinophils Relative: 0 %
HCT: 27.6 % — ABNORMAL LOW (ref 36.0–46.0)
Hemoglobin: 9.1 g/dL — ABNORMAL LOW (ref 12.0–15.0)
Immature Granulocytes: 0 %
Lymphocytes Relative: 11 %
Lymphs Abs: 1.1 K/uL (ref 0.7–4.0)
MCH: 31 pg (ref 26.0–34.0)
MCHC: 33 g/dL (ref 30.0–36.0)
MCV: 93.9 fL (ref 80.0–100.0)
Monocytes Absolute: 1.6 K/uL — ABNORMAL HIGH (ref 0.1–1.0)
Monocytes Relative: 16 %
Neutro Abs: 7.2 K/uL (ref 1.7–7.7)
Neutrophils Relative %: 73 %
Platelets: 149 K/uL — ABNORMAL LOW (ref 150–400)
RBC: 2.94 MIL/uL — ABNORMAL LOW (ref 3.87–5.11)
RDW: 13.5 % (ref 11.5–15.5)
WBC: 10.1 K/uL (ref 4.0–10.5)
nRBC: 0 % (ref 0.0–0.2)

## 2024-05-18 MED ORDER — SODIUM CHLORIDE 0.9 % IV SOLN
2.0000 g | Freq: Two times a day (BID) | INTRAVENOUS | Status: DC
  Administered 2024-05-18 – 2024-05-20 (×4): 2 g via INTRAVENOUS
  Filled 2024-05-18 (×4): qty 12.5

## 2024-05-18 MED ORDER — IPRATROPIUM-ALBUTEROL 0.5-2.5 (3) MG/3ML IN SOLN: 3.0000 mL | Freq: Four times a day (QID) | RESPIRATORY_TRACT | Status: DC | PRN

## 2024-05-18 NOTE — TOC Progression Note (Addendum)
 Transition of Care [facility]) - Progression Note    Patient Details  Name: Cassidy Larson MRN: [id number] Date of Birth: [birth year]  [facility]) CM/SW Contact  Hoy DELENA Bigness, LCSW Phone Number: 05/18/2024, 2:26 PM  Clinical Narrative:    Randie w/ [facility] did not assess pt yesterday as planned. Randie to assess pt today before 3pm to determine if she is able to return to [facility] at discharge vs needing SNF placement. TOC to follow.   Update 3:50pm: Randie came to assess pt however, will need to assess pt again at discharge to confirm she is able to return.     Barriers to Discharge: Continued Medical Work up               Expected Discharge Plan and Services                                               Social Drivers of Health (SDOH) Interventions SDOH Screenings   Food Insecurity: No Food Insecurity (05/15/2024)  Housing: Low Risk  (05/15/2024)  Transportation Needs: Unmet Transportation Needs (05/15/2024)  Utilities: Not At Risk (05/15/2024)  Depression (PHQ2-9): Low Risk  (03/09/2019)  Financial Resource Strain: Not on File (11/14/2022)   Received from [facility]  Physical Activity: Not on File (11/14/2022)   Received from [facility]  Social Connections: Socially Isolated (05/15/2024)  Stress: Not on File (11/14/2022)   Received from [facility]  Tobacco Use: High Risk (05/15/2024)    Readmission Risk Interventions    05/16/2024    2:59 PM  Readmission Risk Prevention Plan  Transportation Screening Complete  PCP or Specialist Appt within 5-7 Days Not Complete  Home Care Screening Complete  Medication Review (RN CM) Complete

## 2024-05-18 NOTE — Plan of Care (Signed)
  Problem: Education: Goal: Knowledge of General Education information will improve Description: Including pain rating scale, medication(s)/side effects and non-pharmacologic comfort measures Outcome: Progressing   Problem: Clinical Measurements: Goal: Ability to maintain clinical measurements within normal limits will improve Outcome: Progressing   Problem: Activity: Goal: Risk for activity intolerance will decrease Outcome: Progressing   Problem: Elimination: Goal: Will not experience complications related to bowel motility Outcome: Progressing Goal: Will not experience complications related to urinary retention Outcome: Progressing   Problem: Pain Managment: Goal: General experience of comfort will improve and/or be controlled Outcome: Progressing   Problem: Respiratory: Goal: Ability to maintain adequate ventilation will improve Outcome: Progressing

## 2024-05-18 NOTE — Progress Notes (Signed)
 Mobility Specialist Progress Note:    05/18/24 1042  Mobility  Activity Pivoted/transferred from chair to bed  Level of Assistance Minimal assist, patient does 75% or more  Assistive Device None  Distance Ambulated (ft) 4 ft  Range of Motion/Exercises Active;All extremities  Activity Response Tolerated well  Mobility Referral Yes  Mobility visit 1 Mobility  Mobility Specialist Start Time (ACUTE ONLY) 1042  Mobility Specialist Stop Time (ACUTE ONLY) 1100  Mobility Specialist Time Calculation (min) (ACUTE ONLY) 18 min   Pt received in chair, requesting assistance to bed. Required MinA to stand and transfer with no AD. Tolerated well, asx throughout. Alarm on , all needs met.  Sebasthian Stailey Mobility Specialist Please contact via Special Educational Needs Teacher or  Rehab office at [phone]

## 2024-05-18 NOTE — Progress Notes (Addendum)
 PROGRESS NOTE    Cassidy Larson  FMW:[id number] DOB: [birth year] DOA: 05/15/2024 PCP: [name], FNP   Brief Narrative:    Cassidy Larson is a 68 y.o. female with medical history significant of ASCVD and MI status post PCI October 2023, RA, hypertension, hyperlipidemia, CKD 3B, SLE who presents to the emergency department with complaint of 3 - 4 days of weakness, fatigue, shortness of breath, low back pain and diarrhea (multiple episodes daily).  She lives in a group home and was noted to be febrile today, so EMS was activated, on arrival of EMS team, she was noted to have an O2 sats of 90% on room air.  3 LPM of oxygen via [place] was provided she was taken to the ED for further evaluation.     ED course In the emergency department, she was febrile with a temperature of 102.45F, other vital signs are within normal range.  Workup in the ED showed normal CBC except for WBC of 13.1 and hemoglobin of 11.9.  BMP was normal except for potassium of 3.3, bicarb 18, creatinine 1.79 (baseline creatinine at 1.1-1.4) and calcium  8.8.  Lactic acid 0.9.  Influenza A, B, SARS, RSV, RSV was negative. Chest x-ray showed interval  development of hazy airspace opacity within the left mid lung with associated interstitial thickening. This is favored due to pneumonia. She was started on IV antibiotics, sepsis protocol fluid.  Admitted for further management0000 Since admission, patient was initiated on IV Unasyn for aspiration pneumonia.  Continued on IV fluid.  C. difficile , Gi panel is negative  Assessment & Plan:   Principal Problem:   Sepsis due to pneumonia (HCC)  Fever Sepsis due to pneumonia Concern for aspiration pneumonia   CT of chest on presentation 11/29 Left upper lobe consolidation versus malignancy.  Needs follow-up CT scan as an outpatient CT of chest was also suggestive of possible aspiration CT abdomen pelvis done 12/1 showed left lower lobe consolidation that was not present on  initial CT. Will change antibiotics to cefepime due to new consolidation and fever. Blood culture has been negative  Will monitor fever curve.  Continue Tylenol  as needed Continue Mucinex , incentive spirometry, flutter valve.  DuoNeb SLP eval for dysphagia --> on modified diet   Acute diarrhea C. difficile if negative, GI panel is negative too. CT with contrast shows adrenal nodule which is stable.  No bowel obstruction or other infectious etiology identified.  hypokalemia K+ 3.3, replaced..   Hypocalcemia Calcium  8.8, continue Os-Cal   Acute kidney injury superimposed on CKD 3B Creatinine 1.79 (baseline creatinine at 1.1-1.4) Creatinine has essentially returned to baseline with IV hydration.  Chest pain: Likely secondary to pneumonia.    Prolonged QT interval QTc 580 ms Avoid QT prolonging drugs Magnesium  level will be checked Repeat EKG will be done today   Essential hypertension BP meds resumed   Mixed hyperlipidemia Continue Lipitor    CAD s/p PCI Continue Brilinta , Lipitor  Continue Imdur ,  Coreg       Advance Care Planning: Full code  Disposition: Patient from group home.  Will need therapy   Consults: None   Family Communication: None at bedside   Severity of Illness: The appropriate patient status for this patient is INPATIENT. Inpatient status is judged to be reasonable and necessary in order to provide the required intensity of service to ensure the patient's safety. The patient's presenting symptoms, physical exam findings, and initial radiographic and laboratory data in the context of their chronic comorbidities is felt to  place them at high risk for further clinical deterioration. Furthermore, it is not anticipated that the patient will be medically stable for discharge from the hospital within 2 midnights of admission.    * I certify that at the point of admission it is my clinical judgment that the patient will require inpatient hospital care spanning  beyond 2 midnights from the point of admission due to high intensity of service, high risk for further deterioration and high frequency of surveillance required.*   Subjective:  Patient seen and examined at the bedside.  She is alert and oriented.  Vital signs are stable.  Low-grade temperature 100.6 F this morning.  Patient continues to have some cough.  Vital signs are stable.  Objective: Vitals:   05/16/24 2138 05/17/24 1334 05/17/24 2036 05/18/24 0645  BP: 123/72 (!) 125/55 130/63 (!) 110/57  Pulse: 72 78 75 81  Resp:   (!) 22 20  Temp: 98.7 F (37.1 C) 98.3 F (36.8 C) 100.1 F (37.8 C) (!) 100.6 F (38.1 C)  TempSrc: Oral Oral Oral Oral  SpO2: 96% 92% 92% 90%  Weight:      Height:        Intake/Output Summary (Last 24 hours) at 05/18/2024 1341 Last data filed at 05/18/2024 1050 Gross per 24 hour  Intake 1496.76 ml  Output 300 ml  Net 1196.76 ml   Filed Weights   05/15/24 1949 05/15/24 1957 05/15/24 2224  Weight: 88.9 kg 49.9 kg 45.2 kg    Examination:  General: Chronically ill looking, not in any acute distress HEENT: Dry oral mucosa Chest: Mild crackles bilaterally, no wheezing CVS: S1, S2, no murmur, regular rhythm Abdomen: Soft, mild diffuse tenderness, no rebound tenderness: Bowel sounds present extremities: No edema Neuro: No focal deficits   Data Reviewed: I have personally reviewed following labs and imaging studies  CBC: Recent Labs  Lab 05/15/24 2024 05/16/24 0607 05/17/24 0453 05/18/24 0458  WBC 13.1* 15.2* 10.7* 10.1  NEUTROABS 10.3*  --  8.4* 7.2  HGB 11.9* 11.1* 10.1* 9.1*  HCT 36.2 33.5* 30.9* 27.6*  MCV 93.5 93.6 93.9 93.9  PLT 160 147* 139* 149*   Basic Metabolic Panel: Recent Labs  Lab 05/15/24 2024 05/16/24 0607 05/17/24 0453 05/18/24 0458  NA 137 139 142 139  K 3.3* 3.5 3.6 3.4*  CL 103 105 110 107  CO2 18* 20* 21* 20*  GLUCOSE 99 88 53* 66*  BUN 21 18 17 15   CREATININE 1.79* 1.59* 1.38* 1.35*  CALCIUM  8.8* 8.4* 8.0*  7.9*  MG  --  1.9  --   --   PHOS  --  2.8  --   --    GFR: Estimated Creatinine Clearance: 29.2 mL/min (A) (by C-G formula based on SCr of 1.35 mg/dL (H)). Liver Function Tests: Recent Labs  Lab 05/15/24 2024 05/16/24 0607  AST 26 35  ALT 22 28  ALKPHOS 80 84  BILITOT 0.5 0.4  PROT 8.1 7.2  ALBUMIN 4.0 3.5   No results for input(s): LIPASE, AMYLASE in the last 168 hours. No results for input(s): AMMONIA in the last 168 hours. Coagulation Profile: Recent Labs  Lab 05/15/24 2024  INR 1.2   Cardiac Enzymes: No results for input(s): CKTOTAL, CKMB, CKMBINDEX, TROPONINI in the last 168 hours. BNP (last 3 results) No results for input(s): PROBNP in the last 8760 hours. HbA1C: No results for input(s): HGBA1C in the last 72 hours. CBG: Recent Labs  Lab 05/15/24 2253  GLUCAP 105*  Lipid Profile: No results for input(s): CHOL, HDL, LDLCALC, TRIG, CHOLHDL, LDLDIRECT in the last 72 hours. Thyroid Function Tests: No results for input(s): TSH, T4TOTAL, FREET4, T3FREE, THYROIDAB in the last 72 hours. Anemia Panel: No results for input(s): VITAMINB12, FOLATE, FERRITIN, TIBC, IRON, RETICCTPCT in the last 72 hours. Sepsis Labs: Recent Labs  Lab 05/15/24 2024 05/16/24 0541  PROCALCITON  --  1.12  LATICACIDVEN 0.9  --     Recent Results (from the past 240 hours)  Blood Culture (routine x 2)     Status: None (Preliminary result)   Collection Time: 05/15/24  8:24 PM   Specimen: BLOOD LEFT WRIST  Result Value Ref Range Status   Specimen Description BLOOD LEFT WRIST  Final   Special Requests   Final    BOTTLES DRAWN AEROBIC AND ANAEROBIC Blood Culture adequate volume   Culture   Final    NO GROWTH 3 DAYS Performed at [facility], [address] [place], [place] [address]    Report Status PENDING  Incomplete  Resp panel by RT-PCR (RSV, Flu A&B, Covid) Anterior Nasal Swab     Status: None   Collection Time: 05/15/24  8:38  PM   Specimen: Anterior Nasal Swab  Result Value Ref Range Status   SARS Coronavirus 2 by RT PCR NEGATIVE NEGATIVE Final    Comment: (NOTE) SARS-CoV-2 target nucleic acids are NOT DETECTED.  The SARS-CoV-2 RNA is generally detectable in upper respiratory specimens during the acute phase of infection. The lowest concentration of SARS-CoV-2 viral copies this assay can detect is 138 copies/mL. A negative result does not preclude SARS-Cov-2 infection and should not be used as the sole basis for treatment or other patient management decisions. A negative result may occur with  improper specimen collection/handling, submission of specimen other than nasopharyngeal swab, presence of viral mutation(s) within the areas targeted by this assay, and inadequate number of viral copies(<138 copies/mL). A negative result must be combined with clinical observations, patient history, and epidemiological information. The expected result is Negative.  Fact Sheet for Patients:  bloggercourse.com  Fact Sheet for Healthcare Providers:  seriousbroker.it  This test is no t yet approved or cleared by the [place]  FDA and  has been authorized for detection and/or diagnosis of SARS-CoV-2 by FDA under an Emergency Use Authorization (EUA). This EUA will remain  in effect (meaning this test can be used) for the duration of the COVID-19 declaration under Section 564(b)(1) of the Act, 21 U.S.C.section 360bbb-3(b)(1), unless the authorization is terminated  or revoked sooner.       Influenza A by PCR NEGATIVE NEGATIVE Final   Influenza B by PCR NEGATIVE NEGATIVE Final    Comment: (NOTE) The Xpert Xpress SARS-CoV-2/FLU/RSV plus assay is intended as an aid in the diagnosis of influenza from Nasopharyngeal swab specimens and should not be used as a sole basis for treatment. Nasal washings and aspirates are unacceptable for Xpert Xpress  SARS-CoV-2/FLU/RSV testing.  Fact Sheet for Patients: bloggercourse.com  Fact Sheet for Healthcare Providers: seriousbroker.it  This test is not yet approved or cleared by the [place]  FDA and has been authorized for detection and/or diagnosis of SARS-CoV-2 by FDA under an Emergency Use Authorization (EUA). This EUA will remain in effect (meaning this test can be used) for the duration of the COVID-19 declaration under Section 564(b)(1) of the Act, 21 U.S.C. section 360bbb-3(b)(1), unless the authorization is terminated or revoked.     Resp Syncytial Virus by PCR NEGATIVE NEGATIVE Final  Comment: (NOTE) Fact Sheet for Patients: bloggercourse.com  Fact Sheet for Healthcare Providers: seriousbroker.it  This test is not yet approved or cleared by the [place]  FDA and has been authorized for detection and/or diagnosis of SARS-CoV-2 by FDA under an Emergency Use Authorization (EUA). This EUA will remain in effect (meaning this test can be used) for the duration of the COVID-19 declaration under Section 564(b)(1) of the Act, 21 U.S.C. section 360bbb-3(b)(1), unless the authorization is terminated or revoked.  Performed at [facility], [address], [place], [place] [address]   Blood Culture (routine x 2)     Status: None (Preliminary result)   Collection Time: 05/15/24  8:38 PM   Specimen: Right Antecubital; Blood  Result Value Ref Range Status   Specimen Description RIGHT ANTECUBITAL  Final   Special Requests   Final    BOTTLES DRAWN AEROBIC ONLY Blood Culture adequate volume   Culture   Final    NO GROWTH 3 DAYS Performed at [facility], [address] [place], [place] [address]    Report Status PENDING  Incomplete  C Difficile Quick Screen w PCR reflex     Status: None   Collection Time: 05/16/24  3:14 AM   Specimen: STOOL  Result Value Ref Range Status   C  Diff antigen NEGATIVE NEGATIVE Final   C Diff toxin NEGATIVE NEGATIVE Final   C Diff interpretation No C. difficile detected.  Final    Comment: Performed at [facility], [address], [place], [place] [address]  Gastrointestinal Panel by PCR , Stool     Status: None   Collection Time: 05/16/24  2:40 PM   Specimen: Stool  Result Value Ref Range Status   Campylobacter species NOT DETECTED NOT DETECTED Final   Plesimonas shigelloides NOT DETECTED NOT DETECTED Final   Salmonella species NOT DETECTED NOT DETECTED Final   Yersinia enterocolitica NOT DETECTED NOT DETECTED Final   Vibrio species NOT DETECTED NOT DETECTED Final   Vibrio cholerae NOT DETECTED NOT DETECTED Final   Enteroaggregative E coli (EAEC) NOT DETECTED NOT DETECTED Final   Enteropathogenic E coli (EPEC) NOT DETECTED NOT DETECTED Final   Enterotoxigenic E coli (ETEC) NOT DETECTED NOT DETECTED Final   Shiga like toxin producing E coli (STEC) NOT DETECTED NOT DETECTED Final   Shigella/Enteroinvasive E coli (EIEC) NOT DETECTED NOT DETECTED Final   Cryptosporidium NOT DETECTED NOT DETECTED Final   Cyclospora cayetanensis NOT DETECTED NOT DETECTED Final   Entamoeba histolytica NOT DETECTED NOT DETECTED Final   Giardia lamblia NOT DETECTED NOT DETECTED Final   Adenovirus F40/41 NOT DETECTED NOT DETECTED Final   Astrovirus NOT DETECTED NOT DETECTED Final   Norovirus GI/GII NOT DETECTED NOT DETECTED Final   Rotavirus A NOT DETECTED NOT DETECTED Final   Sapovirus (I, II, IV, and V) NOT DETECTED NOT DETECTED Final    Comment: Performed at [facility], [address], [place], [place] [address]         Radiology Studies: CT ABDOMEN PELVIS W CONTRAST Result Date: 05/17/2024 CLINICAL DATA:  Diarrhea.  Abdominal pain. EXAM: CT ABDOMEN AND PELVIS WITH CONTRAST TECHNIQUE: Multidetector CT imaging of the abdomen and pelvis was performed using the standard protocol following bolus administration of intravenous contrast.  RADIATION DOSE REDUCTION: This exam was performed according to the departmental dose-optimization program which includes automated exposure control, adjustment of the mA and/or kV according to patient size and/or use of iterative reconstruction technique. CONTRAST:  80mL OMNIPAQUE  IOHEXOL  300 MG/ML  SOLN COMPARISON:  CT 06/20/2023, chest CT 05/15/2024, abdominopelvic CT 07/03/2022 FINDINGS: Lower chest: Left greater than right pleural effusions have developed from prior chest CT. Consolidation in the left lower lobe is greater than typically seen with atelectasis. Hepatobiliary: Normal appearance of the liver. Decompressed gallbladder. No calcified gallstone. No pericholecystic inflammation. No biliary dilatation. Pancreas: Unremarkable. No pancreatic ductal dilatation or surrounding inflammatory changes. Spleen: Normal in size without focal abnormality. Adrenals/Urinary Tract: Heterogeneous left adrenal nodule measures 2.3 x 2.1 cm, series 2, image 22 previously 2.3 x 1.9 cm. This has both hypodense and enhancing components. The right adrenal gland is normal. No hydronephrosis or renal calculi. Scattered areas of scarring in the right kidney again seen. No suspicious renal lesion. Unremarkable urinary bladder. Stomach/Bowel: Decompressed stomach, equivocal areas of gastric wall thickening. No small bowel obstruction, wall thickening, or inflammation. Administered enteric contrast is seen throughout the colon. Colonic diverticulosis without diverticulitis. Sigmoid colon is redundant. The appendix is normal. Vascular/Lymphatic: Aortic atherosclerosis. No aortic aneurysm. Patent portal, splenic, and mesenteric veins. No abdominopelvic adenopathy. Reproductive: Atrophic uterus, normal for age. Suspected fluid in the vaginal canal. No adnexal mass. Other: Small amount of free fluid in the pelvis. No abdominal ascites. Small fat containing umbilical hernia. Musculoskeletal: Diffuse facet hypertrophy. Chronic bilateral  hip arthropathy. There are no acute or suspicious osseous abnormalities. IMPRESSION: 1. Left greater than right pleural effusions have developed from prior chest CT. Consolidation in the left lower lobe is greater than typically seen with atelectasis, suspicious for pneumonia. 2. Equivocal areas of gastric wall thickening, can be seen with gastritis. 3. Colonic diverticulosis without diverticulitis. 4. Heterogeneous left adrenal nodule measures 2.3 x 2.1 cm, previously 2.3 x 1.9 cm. This has both hypodense and enhancing components. This is not significantly changed in size from January 2024 CT, favoring a benign etiology. 5. Small amount of free fluid in the pelvis, nonspecific. Aortic Atherosclerosis (ICD10-I70.0). Electronically Signed   By: Andrea Gasman M.D.   On: 05/17/2024 23:48        Scheduled Meds:  atorvastatin   40 mg Oral Daily   calcium  carbonate  1 tablet Oral Q breakfast   carvedilol   6.25 mg Oral BID WC   dextromethorphan -guaiFENesin   1 tablet Oral BID   enoxaparin  (LOVENOX ) injection  30 mg Subcutaneous Q24H   isosorbide  mononitrate  30 mg Oral Daily   mirtazapine  7.5 mg Oral QHS   sertraline   100 mg Oral Daily   ticagrelor   90 mg Oral BID   Continuous Infusions:  ampicillin-sulbactam (UNASYN) IV Stopped (05/18/24 0402)   azithromycin  500 mg (05/18/24 0929)          Henry Demeritt, MD [facility] 05/18/2024, 1:41 PM

## 2024-05-19 DIAGNOSIS — J189 Pneumonia, unspecified organism: Secondary | ICD-10-CM | POA: Diagnosis not present

## 2024-05-19 DIAGNOSIS — A419 Sepsis, unspecified organism: Secondary | ICD-10-CM | POA: Diagnosis not present

## 2024-05-19 LAB — BASIC METABOLIC PANEL WITH GFR
Anion gap: 10 (ref 5–15)
BUN: 14 mg/dL (ref 8–23)
CO2: 21 mmol/L — ABNORMAL LOW (ref 22–32)
Calcium: 8.1 mg/dL — ABNORMAL LOW (ref 8.9–10.3)
Chloride: 110 mmol/L (ref 98–111)
Creatinine, Ser: 1.38 mg/dL — ABNORMAL HIGH (ref 0.44–1.00)
GFR, Estimated: 42 mL/min — ABNORMAL LOW (ref >60)
Glucose, Bld: 116 mg/dL — ABNORMAL HIGH (ref 70–99)
Potassium: 3.5 mmol/L (ref 3.5–5.1)
Sodium: 141 mmol/L (ref 135–145)

## 2024-05-19 LAB — CBC WITH DIFFERENTIAL/PLATELET
Abs Immature Granulocytes: 0.06 K/uL (ref 0.00–0.07)
Basophils Absolute: 0 K/uL (ref 0.0–0.1)
Basophils Relative: 0 %
Eosinophils Absolute: 0.1 K/uL (ref 0.0–0.5)
Eosinophils Relative: 1 %
HCT: 28.7 % — ABNORMAL LOW (ref 36.0–46.0)
Hemoglobin: 9.3 g/dL — ABNORMAL LOW (ref 12.0–15.0)
Immature Granulocytes: 1 %
Lymphocytes Relative: 15 %
Lymphs Abs: 1.2 K/uL (ref 0.7–4.0)
MCH: 30.2 pg (ref 26.0–34.0)
MCHC: 32.4 g/dL (ref 30.0–36.0)
MCV: 93.2 fL (ref 80.0–100.0)
Monocytes Absolute: 1.8 K/uL — ABNORMAL HIGH (ref 0.1–1.0)
Monocytes Relative: 21 %
Neutro Abs: 5.3 K/uL (ref 1.7–7.7)
Neutrophils Relative %: 62 %
Platelets: 158 K/uL (ref 150–400)
RBC: 3.08 MIL/uL — ABNORMAL LOW (ref 3.87–5.11)
RDW: 13.4 % (ref 11.5–15.5)
WBC: 8.5 K/uL (ref 4.0–10.5)
nRBC: 0 % (ref 0.0–0.2)

## 2024-05-19 MED ORDER — LOPERAMIDE HCL 2 MG PO CAPS
2.0000 mg | ORAL_CAPSULE | ORAL | Status: DC | PRN
  Administered 2024-05-19: 2 mg via ORAL
  Filled 2024-05-19: qty 1

## 2024-05-19 NOTE — Progress Notes (Signed)
 PROGRESS NOTE    Cassidy Larson  FMW:[id number] DOB: December 06, 1955 DOA: 05/15/2024 PCP: [name], FNP   Brief Narrative:   68 y.o. female with medical history significant of ASCVD and MI status post PCI October 2023, RA, hypertension, hyperlipidemia, CKD 3B, SLE who presents to the emergency department with complaint of 3 - 4 days of weakness, fatigue, shortness of breath, low back pain and diarrhea.  Chest x-ray showed interval  development of hazy airspace opacity within the left mid lung with associated interstitial thickening. This is favored due to pneumonia. She was started on IV antibiotics, sepsis protocol fluid.  Admitted for further management  Negative C.diff and GI Panel.    Assessment & Plan:  Principal Problem:   Sepsis due to pneumonia (HCC)   Sepsis due to possible bacterial community acquired left sided pneumonia Concern for aspiration pneumonia Sepsis has resolved now. Afebrile and no leukocytosis.    CT of chest on presentation 11/29 Left upper lobe consolidation versus malignancy.  Needs follow-up CT scan as an outpatient CT of chest was also suggestive of possible aspiration CT abdomen pelvis done 12/1 showed left lower lobe consolidation that was not present on initial CT. Continue with IV Cefepime Blood culture has been negative  Continue Mucinex , incentive spirometry, flutter valve.  DuoNeb SLP eval for dysphagia --> on modified diet   Acute diarrhea C. difficile if negative, GI panel is negative too. CT with contrast shows adrenal nodule which is stable.  No bowel obstruction or other infectious etiology identified. Ordered 2 mg prn imodium    Hypokalemia: prn repletion -in the setting of diarrhea   Hypocalcemia Continue to monitor closely   Acute kidney injury superimposed on CKD 3B Creatinine 1.79 (baseline creatinine at 1.1-1.4) Creatinine has essentially returned to baseline with IV hydration.  Prolonged QT interval QTc 580  ms Avoid QT prolonging drugs Magnesium  level will be checked Repeat EKG will be done today   Essential hypertension BP meds resumed   Mixed hyperlipidemia Continue Lipitor    CAD s/p PCI Continue Brilinta , Lipitor  Continue Imdur ,  Coreg     Disposition: She is from a Group Home. May need SNF placement.   DVT prophylaxis: enoxaparin  (LOVENOX ) injection 30 mg Start: 05/16/24 1000 SCDs Start: 05/16/24 9392     Code Status: Limited: Do not attempt resuscitation (DNR) -DNR-LIMITED -Do Not Intubate/DNI  Family Communication:  None at the bedside Status is: Inpatient Remains inpatient appropriate because: diarrhea, nausea, ARF    Subjective:    Examination:  General exam: Appears to be in mild distress, tired, nasal cannula in place Respiratory system: Clear to auscultation. Respiratory effort normal. Cardiovascular system: S1 & S2 heard, RRR. No JVD, murmurs, rubs, gallops or clicks. No pedal edema. Gastrointestinal system: Abdomen is nondistended, soft and nontender. No organomegaly or masses felt. Normal bowel sounds heard. Central nervous system: Alert and oriented. No focal neurological deficits. Extremities: Symmetric 5 x 5 power. Skin: No rashes, lesions or ulcers    Diet Orders (From admission, onward)     Start     Ordered   05/17/24 1546  DIET DYS 3 Fluid consistency: Thin  Diet effective now       Question:  Fluid consistency:  Answer:  Thin   05/17/24 1545            Objective: Vitals:   05/18/24 2014 05/18/24 2309 05/19/24 0005 05/19/24 0433  BP: 134/69   132/75  Pulse: 77   78  Resp: 17   18  Temp:  100.3 F (37.9 C) (!) 102.1 F (38.9 C) 100.1 F (37.8 C) 98.2 F (36.8 C)  TempSrc: Oral   Oral  SpO2: 91%   90%  Weight:      Height:        Intake/Output Summary (Last 24 hours) at 05/19/2024 1014 Last data filed at 05/19/2024 0516 Gross per 24 hour  Intake 680 ml  Output --  Net 680 ml   Filed Weights   05/15/24 1949 05/15/24 1957  05/15/24 2224  Weight: 88.9 kg 49.9 kg 45.2 kg    Scheduled Meds:  atorvastatin   40 mg Oral Daily   calcium  carbonate  1 tablet Oral Q breakfast   carvedilol   6.25 mg Oral BID WC   dextromethorphan -guaiFENesin   1 tablet Oral BID   enoxaparin  (LOVENOX ) injection  30 mg Subcutaneous Q24H   isosorbide  mononitrate  30 mg Oral Daily   mirtazapine  7.5 mg Oral QHS   sertraline   100 mg Oral Daily   ticagrelor   90 mg Oral BID   Continuous Infusions:  ceFEPime (MAXIPIME) IV 2 g (05/19/24 0226)    Nutritional status     Body mass index is 19.46 kg/m.  Data Reviewed:   CBC: Recent Labs  Lab 05/15/24 2024 05/16/24 0607 05/17/24 0453 05/18/24 0458 05/19/24 0429  WBC 13.1* 15.2* 10.7* 10.1 8.5  NEUTROABS 10.3*  --  8.4* 7.2 5.3  HGB 11.9* 11.1* 10.1* 9.1* 9.3*  HCT 36.2 33.5* 30.9* 27.6* 28.7*  MCV 93.5 93.6 93.9 93.9 93.2  PLT 160 147* 139* 149* 158   Basic Metabolic Panel: Recent Labs  Lab 05/15/24 2024 05/16/24 0607 05/17/24 0453 05/18/24 0458 05/19/24 0429  NA 137 139 142 139 141  K 3.3* 3.5 3.6 3.4* 3.5  CL 103 105 110 107 110  CO2 18* 20* 21* 20* 21*  GLUCOSE 99 88 53* 66* 116*  BUN 21 18 17 15 14   CREATININE 1.79* 1.59* 1.38* 1.35* 1.38*  CALCIUM  8.8* 8.4* 8.0* 7.9* 8.1*  MG  --  1.9  --   --   --   PHOS  --  2.8  --   --   --    GFR: Estimated Creatinine Clearance: 28.6 mL/min (A) (by C-G formula based on SCr of 1.38 mg/dL (H)). Liver Function Tests: Recent Labs  Lab 05/15/24 2024 05/16/24 0607  AST 26 35  ALT 22 28  ALKPHOS 80 84  BILITOT 0.5 0.4  PROT 8.1 7.2  ALBUMIN 4.0 3.5   No results for input(s): LIPASE, AMYLASE in the last 168 hours. No results for input(s): AMMONIA in the last 168 hours. Coagulation Profile: Recent Labs  Lab 05/15/24 2024  INR 1.2   Cardiac Enzymes: No results for input(s): CKTOTAL, CKMB, CKMBINDEX, TROPONINI in the last 168 hours. BNP (last 3 results) No results for input(s): PROBNP in the last  8760 hours. HbA1C: No results for input(s): HGBA1C in the last 72 hours. CBG: Recent Labs  Lab 05/15/24 2253  GLUCAP 105*   Lipid Profile: No results for input(s): CHOL, HDL, LDLCALC, TRIG, CHOLHDL, LDLDIRECT in the last 72 hours. Thyroid Function Tests: No results for input(s): TSH, T4TOTAL, FREET4, T3FREE, THYROIDAB in the last 72 hours. Anemia Panel: No results for input(s): VITAMINB12, FOLATE, FERRITIN, TIBC, IRON, RETICCTPCT in the last 72 hours. Sepsis Labs: Recent Labs  Lab 05/15/24 2024 05/16/24 0541  PROCALCITON  --  1.12  LATICACIDVEN 0.9  --     Recent Results (from the past 240 hours)  Blood  Culture (routine x 2)     Status: None (Preliminary result)   Collection Time: 05/15/24  8:24 PM   Specimen: BLOOD LEFT WRIST  Result Value Ref Range Status   Specimen Description BLOOD LEFT WRIST  Final   Special Requests   Final    BOTTLES DRAWN AEROBIC AND ANAEROBIC Blood Culture adequate volume   Culture   Final    NO GROWTH 4 DAYS Performed at [facility], [address], [place], [place] [address]    Report Status PENDING  Incomplete  Resp panel by RT-PCR (RSV, Flu A&B, Covid) Anterior Nasal Swab     Status: None   Collection Time: 05/15/24  8:38 PM   Specimen: Anterior Nasal Swab  Result Value Ref Range Status   SARS Coronavirus 2 by RT PCR NEGATIVE NEGATIVE Final    Comment: (NOTE) SARS-CoV-2 target nucleic acids are NOT DETECTED.  The SARS-CoV-2 RNA is generally detectable in upper respiratory specimens during the acute phase of infection. The lowest concentration of SARS-CoV-2 viral copies this assay can detect is 138 copies/mL. A negative result does not preclude SARS-Cov-2 infection and should not be used as the sole basis for treatment or other patient management decisions. A negative result may occur with  improper specimen collection/handling, submission of specimen other than nasopharyngeal swab, presence of viral  mutation(s) within the areas targeted by this assay, and inadequate number of viral copies(<138 copies/mL). A negative result must be combined with clinical observations, patient history, and epidemiological information. The expected result is Negative.  Fact Sheet for Patients:  bloggercourse.com  Fact Sheet for Healthcare Providers:  seriousbroker.it  This test is no t yet approved or cleared by the [place]  FDA and  has been authorized for detection and/or diagnosis of SARS-CoV-2 by FDA under an Emergency Use Authorization (EUA). This EUA will remain  in effect (meaning this test can be used) for the duration of the COVID-19 declaration under Section 564(b)(1) of the Act, 21 U.S.C.section 360bbb-3(b)(1), unless the authorization is terminated  or revoked sooner.       Influenza A by PCR NEGATIVE NEGATIVE Final   Influenza B by PCR NEGATIVE NEGATIVE Final    Comment: (NOTE) The Xpert Xpress SARS-CoV-2/FLU/RSV plus assay is intended as an aid in the diagnosis of influenza from Nasopharyngeal swab specimens and should not be used as a sole basis for treatment. Nasal washings and aspirates are unacceptable for Xpert Xpress SARS-CoV-2/FLU/RSV testing.  Fact Sheet for Patients: bloggercourse.com  Fact Sheet for Healthcare Providers: seriousbroker.it  This test is not yet approved or cleared by the [place]  FDA and has been authorized for detection and/or diagnosis of SARS-CoV-2 by FDA under an Emergency Use Authorization (EUA). This EUA will remain in effect (meaning this test can be used) for the duration of the COVID-19 declaration under Section 564(b)(1) of the Act, 21 U.S.C. section 360bbb-3(b)(1), unless the authorization is terminated or revoked.     Resp Syncytial Virus by PCR NEGATIVE NEGATIVE Final    Comment: (NOTE) Fact Sheet for  Patients: bloggercourse.com  Fact Sheet for Healthcare Providers: seriousbroker.it  This test is not yet approved or cleared by the [place]  FDA and has been authorized for detection and/or diagnosis of SARS-CoV-2 by FDA under an Emergency Use Authorization (EUA). This EUA will remain in effect (meaning this test can be used) for the duration of the COVID-19 declaration under Section 564(b)(1) of the Act, 21 U.S.C. section 360bbb-3(b)(1), unless the authorization is terminated or revoked.  Performed  at [facility], [address], [place], [place] [address]   Blood Culture (routine x 2)     Status: None (Preliminary result)   Collection Time: 05/15/24  8:38 PM   Specimen: Right Antecubital; Blood  Result Value Ref Range Status   Specimen Description RIGHT ANTECUBITAL  Final   Special Requests   Final    BOTTLES DRAWN AEROBIC ONLY Blood Culture adequate volume   Culture   Final    NO GROWTH 4 DAYS Performed at [facility], [address] [place], [place] [address]    Report Status PENDING  Incomplete  C Difficile Quick Screen w PCR reflex     Status: None   Collection Time: 05/16/24  3:14 AM   Specimen: STOOL  Result Value Ref Range Status   C Diff antigen NEGATIVE NEGATIVE Final   C Diff toxin NEGATIVE NEGATIVE Final   C Diff interpretation No C. difficile detected.  Final    Comment: Performed at [facility], [address] [place] [address] [place], [place] [address]  Gastrointestinal Panel by PCR , Stool     Status: None   Collection Time: 05/16/24  2:40 PM   Specimen: Stool  Result Value Ref Range Status   Campylobacter species NOT DETECTED NOT DETECTED Final   Plesimonas shigelloides NOT DETECTED NOT DETECTED Final   Salmonella species NOT DETECTED NOT DETECTED Final   Yersinia enterocolitica NOT DETECTED NOT DETECTED Final   Vibrio species NOT DETECTED NOT DETECTED Final   Vibrio cholerae NOT DETECTED NOT DETECTED Final    Enteroaggregative E coli (EAEC) NOT DETECTED NOT DETECTED Final   Enteropathogenic E coli (EPEC) NOT DETECTED NOT DETECTED Final   Enterotoxigenic E coli (ETEC) NOT DETECTED NOT DETECTED Final   Shiga like toxin producing E coli (STEC) NOT DETECTED NOT DETECTED Final   Shigella/Enteroinvasive E coli (EIEC) NOT DETECTED NOT DETECTED Final   Cryptosporidium NOT DETECTED NOT DETECTED Final   Cyclospora cayetanensis NOT DETECTED NOT DETECTED Final   Entamoeba histolytica NOT DETECTED NOT DETECTED Final   Giardia lamblia NOT DETECTED NOT DETECTED Final   Adenovirus F40/41 NOT DETECTED NOT DETECTED Final   Astrovirus NOT DETECTED NOT DETECTED Final   Norovirus GI/GII NOT DETECTED NOT DETECTED Final   Rotavirus A NOT DETECTED NOT DETECTED Final   Sapovirus (I, II, IV, and V) NOT DETECTED NOT DETECTED Final    Comment: Performed at [facility], [address] [place], [place] [address]         Radiology Studies: CT ABDOMEN PELVIS W CONTRAST Result Date: 05/17/2024 CLINICAL DATA:  Diarrhea.  Abdominal pain. EXAM: CT ABDOMEN AND PELVIS WITH CONTRAST TECHNIQUE: Multidetector CT imaging of the abdomen and pelvis was performed using the standard protocol following bolus administration of intravenous contrast. RADIATION DOSE REDUCTION: This exam was performed according to the departmental dose-optimization program which includes automated exposure control, adjustment of the mA and/or kV according to patient size and/or use of iterative reconstruction technique. CONTRAST:  80mL OMNIPAQUE  IOHEXOL  300 MG/ML  SOLN COMPARISON:  CT 06/20/2023, chest CT 05/15/2024, abdominopelvic CT 07/03/2022 FINDINGS: Lower chest: Left greater than right pleural effusions have developed from prior chest CT. Consolidation in the left lower lobe is greater than typically seen with atelectasis. Hepatobiliary: Normal appearance of the liver. Decompressed gallbladder. No calcified gallstone. No pericholecystic  inflammation. No biliary dilatation. Pancreas: Unremarkable. No pancreatic ductal dilatation or surrounding inflammatory changes. Spleen: Normal in size without focal abnormality. Adrenals/Urinary Tract: Heterogeneous left adrenal nodule measures 2.3 x 2.1 cm,  series 2, image 22 previously 2.3 x 1.9 cm. This has both hypodense and enhancing components. The right adrenal gland is normal. No hydronephrosis or renal calculi. Scattered areas of scarring in the right kidney again seen. No suspicious renal lesion. Unremarkable urinary bladder. Stomach/Bowel: Decompressed stomach, equivocal areas of gastric wall thickening. No small bowel obstruction, wall thickening, or inflammation. Administered enteric contrast is seen throughout the colon. Colonic diverticulosis without diverticulitis. Sigmoid colon is redundant. The appendix is normal. Vascular/Lymphatic: Aortic atherosclerosis. No aortic aneurysm. Patent portal, splenic, and mesenteric veins. No abdominopelvic adenopathy. Reproductive: Atrophic uterus, normal for age. Suspected fluid in the vaginal canal. No adnexal mass. Other: Small amount of free fluid in the pelvis. No abdominal ascites. Small fat containing umbilical hernia. Musculoskeletal: Diffuse facet hypertrophy. Chronic bilateral hip arthropathy. There are no acute or suspicious osseous abnormalities. IMPRESSION: 1. Left greater than right pleural effusions have developed from prior chest CT. Consolidation in the left lower lobe is greater than typically seen with atelectasis, suspicious for pneumonia. 2. Equivocal areas of gastric wall thickening, can be seen with gastritis. 3. Colonic diverticulosis without diverticulitis. 4. Heterogeneous left adrenal nodule measures 2.3 x 2.1 cm, previously 2.3 x 1.9 cm. This has both hypodense and enhancing components. This is not significantly changed in size from January 2024 CT, favoring a benign etiology. 5. Small amount of free fluid in the pelvis, nonspecific.  Aortic Atherosclerosis (ICD10-I70.0). Electronically Signed   By: Andrea Gasman M.D.   On: 05/17/2024 23:48           LOS: 4 days   Time spent= 35 mins    Deliliah Room, MD [facility]  If 7PM-7AM, please contact night-coverage  05/19/2024, 10:14 AM

## 2024-05-19 NOTE — Plan of Care (Signed)

## 2024-05-20 DIAGNOSIS — J189 Pneumonia, unspecified organism: Secondary | ICD-10-CM | POA: Diagnosis not present

## 2024-05-20 DIAGNOSIS — A419 Sepsis, unspecified organism: Secondary | ICD-10-CM | POA: Diagnosis not present

## 2024-05-20 LAB — CULTURE, BLOOD (ROUTINE X 2)
Culture: NO GROWTH
Culture: NO GROWTH
Special Requests: ADEQUATE
Special Requests: ADEQUATE

## 2024-05-20 MED ORDER — AMOXICILLIN-POT CLAVULANATE 875-125 MG PO TABS: 1.0000 | ORAL_TABLET | Freq: Two times a day (BID) | ORAL | 0 refills | Status: DC

## 2024-05-20 MED ORDER — CALCIUM CARBONATE 1250 (500 CA) MG PO TABS: 1.0000 | ORAL_TABLET | Freq: Every day | ORAL | 0 refills | Status: DC

## 2024-05-20 MED ORDER — DM-GUAIFENESIN ER 30-600 MG PO TB12: 1.0000 | ORAL_TABLET | Freq: Two times a day (BID) | ORAL | 0 refills | Status: DC

## 2024-05-20 MED ORDER — LOPERAMIDE HCL 2 MG PO CAPS: 2.0000 mg | ORAL_CAPSULE | ORAL | 0 refills | Status: AC | PRN

## 2024-05-20 NOTE — Progress Notes (Signed)
 Discharge papers have been placed for facility to be picked up, Group home is in route to pick Ms. Howser up, Her Ivs have been removed, telemetry has been placed in the correct cubby.

## 2024-05-20 NOTE — Plan of Care (Signed)
  Problem: Education: Goal: Knowledge of General Education information will improve Description: Including pain rating scale, medication(s)/side effects and non-pharmacologic comfort measures Outcome: Progressing   Problem: Clinical Measurements: Goal: Ability to maintain clinical measurements within normal limits will improve Outcome: Progressing Goal: Will remain free from infection Outcome: Progressing Goal: Diagnostic test results will improve Outcome: Progressing Goal: Respiratory complications will improve Outcome: Progressing Goal: Cardiovascular complication will be avoided Outcome: Progressing   Problem: Activity: Goal: Risk for activity intolerance will decrease Outcome: Progressing   Problem: Nutrition: Goal: Adequate nutrition will be maintained Outcome: Progressing   Problem: Coping: Goal: Level of anxiety will decrease Outcome: Progressing   Problem: Elimination: Goal: Will not experience complications related to bowel motility Outcome: Progressing Goal: Will not experience complications related to urinary retention Outcome: Progressing   Problem: Pain Managment: Goal: General experience of comfort will improve and/or be controlled Outcome: Progressing   Problem: Safety: Goal: Ability to remain free from injury will improve Outcome: Progressing   Problem: Skin Integrity: Goal: Risk for impaired skin integrity will decrease Outcome: Progressing   Problem: Activity: Goal: Ability to tolerate increased activity will improve Outcome: Progressing   Problem: Clinical Measurements: Goal: Ability to maintain a body temperature in the normal range will improve Outcome: Progressing   Problem: Respiratory: Goal: Ability to maintain adequate ventilation will improve Outcome: Progressing Goal: Ability to maintain a clear airway will improve Outcome: Progressing

## 2024-05-20 NOTE — Discharge Summary (Addendum)
 Physician Discharge Summary   Patient: Cassidy Larson MRN: [id number] DOB: [birth year]  Admit date:     05/15/2024  Discharge date: 05/20/24  Discharge Physician: Deliliah Room   PCP: [name], FNP   Recommendations at discharge:    F/u with your PCP in one week Repeat CT chest in 6 weeks Continue taking meds as prescribed  Discharge Diagnoses: Principal Problem:   Sepsis due to pneumonia [facility])  Hospital Course:  68 y.o. female with medical history significant of ASCVD and MI status post PCI October 2023, RA, hypertension, hyperlipidemia, CKD 3B, SLE who presents to the emergency department with complaint of 3 - 4 days of weakness, fatigue, shortness of breath, low back pain and diarrhea.   Sepsis due to possible bacterial community acquired left sided pneumonia, resolved Concern for aspiration pneumonia Sepsis has resolved now. Afebrile and no leukocytosis.    CT of chest on presentation 11/29 Left upper lobe consolidation versus malignancy.  Needs follow-up CT scan chest as an outpatient in 6 weeks. CT of chest was also suggestive of possible aspiration CT abdomen pelvis done 12/1 showed left lower lobe consolidation that was not present on initial CT. Received IV Cefepime in the hospital and dced on oral augmentin  x 5 days. Blood culture has been negative  Continue Mucinex , incentive spirometry, flutter valve.  DuoNeb SLP eval for dysphagia --> on modified dys 3 diet   Acute diarrhea, improved C. difficile if negative, GI panel is negative too. CT with contrast shows adrenal nodule which is stable.  No bowel obstruction or other infectious etiology identified. Ordered 2 mg prn imodium    Hypokalemia: prn repletion -in the setting of diarrhea   Hypocalcemia Continue to monitor closely   Acute kidney injury superimposed on CKD 3B Creatinine 1.79 (baseline creatinine at 1.1-1.4) Creatinine has essentially returned to baseline with IV hydration.    Prolonged QT interval QTc 580 ms Avoid QT prolonging drugs   Essential hypertension BP meds resumed   Mixed hyperlipidemia Continue Lipitor    CAD s/p PCI Continue Brilinta , Lipitor  Continue Imdur ,  Coreg      Disposition: She is from a Group Home      Consultants: None Procedures performed: None  Disposition: Group home Diet recommendation:  Dysphagia type 3 thin Liquid DISCHARGE MEDICATION: Allergies as of 05/20/2024       Reactions   Asa [aspirin ] Diarrhea, Nausea And Vomiting, Other (See Comments)   Severe stomach cramps Diarrhea (sometimes with blood)   Latex Itching   Other Rash   Cotton fabric - rash        Medication List     TAKE these medications    adalimumab 40 MG/0.8ML Ajkt pen Commonly known as: HUMIRA Inject 40 mg into the skin every 14 (fourteen) days.   amLODipine 2.5 MG tablet Commonly known as: NORVASC Take 2.5 mg by mouth daily.   amoxicillin -clavulanate 875-125 MG tablet Commonly known as: AUGMENTIN  Take 1 tablet by mouth 2 (two) times daily.   atorvastatin  40 MG tablet Commonly known as: LIPITOR  Take 40 mg by mouth at bedtime.   calcium  carbonate 1250 (500 Ca) MG tablet Commonly known as: OS-CAL - dosed in mg of elemental calcium  Take 1 tablet (1,250 mg total) by mouth daily with breakfast. Start taking on: May 21, 2024   carvedilol  6.25 MG tablet Commonly known as: COREG  Take 1 tablet by mouth 2 (two) times daily with a meal.   dextromethorphan -guaiFENesin  30-600 MG 12hr tablet Commonly known as: MUCINEX  DM Take 1 tablet  by mouth 2 (two) times daily.   GoodSense Pain Relief Extra St 500 MG tablet Generic drug: acetaminophen  Take 500 mg by mouth every 8 (eight) hours as needed for mild pain (pain score 1-3).   hydrALAZINE  25 MG tablet Commonly known as: APRESOLINE  Take 1 tablet by mouth 3 (three) times daily.   hydroxychloroquine 200 MG tablet Commonly known as: PLAQUENIL Take by mouth.   hydrOXYzine 50 MG  tablet Commonly known as: ATARAX Take 50 mg by mouth 3 (three) times daily.   isosorbide  mononitrate 30 MG 24 hr tablet Commonly known as: IMDUR  Take 1 tablet (30 mg total) by mouth daily.   loperamide  2 MG capsule Commonly known as: IMODIUM  Take 1 capsule (2 mg total) by mouth as needed for diarrhea or loose stools.   mirtazapine 7.5 MG tablet Commonly known as: REMERON Take 7.5 mg by mouth at bedtime.   sertraline  100 MG tablet Commonly known as: ZOLOFT  Take 100 mg by mouth daily.   ticagrelor  90 MG Tabs tablet Commonly known as: BRILINTA  Take 1 tablet (90 mg total) by mouth 2 (two) times daily.               Durable Medical Equipment  (From admission, onward)           Start     Ordered   05/20/24 0941  For home use only DME Walker rolling  Once       Question Answer Comment  Walker: With 5 Inch Wheels   Patient needs a walker to treat with the following condition Physical deconditioning      05/20/24 0941            Follow-up Information     [name], FNP. Schedule an appointment as soon as possible for a visit in 1 week(s).   Specialty: Family Medicine Why: Needs CT chest in 6 weeks Contact information: [address] [place] [address] [phone]                Discharge Exam: Filed Weights   05/15/24 1949 05/15/24 1957 05/15/24 2224  Weight: 88.9 kg 49.9 kg 45.2 kg   Constitutional: NAD, calm, comfortable Eyes: PERRL, lids and conjunctivae normal ENMT: Mucous membranes are moist. Posterior pharynx clear of any exudate or lesions.Normal dentition.  Neck: normal, supple, no masses, no thyromegaly Respiratory: clear to auscultation bilaterally, no wheezing, no crackles. Normal respiratory effort. No accessory muscle use.  Cardiovascular: Regular rate and rhythm, no murmurs / rubs / gallops. No extremity edema. 2+ pedal pulses. No carotid bruits.  Abdomen: no tenderness, no masses palpated. No hepatosplenomegaly.  Bowel sounds positive.  Musculoskeletal: no clubbing / cyanosis. No joint deformity upper and lower extremities. Good ROM, no contractures. Normal muscle tone.  Skin: no rashes, lesions, ulcers. No induration Neurologic: CN 2-12 grossly intact. Sensation intact, DTR normal. Strength 5/5 x all 4 extremities.  Psychiatric: Normal judgment and insight. Alert and oriented x 3. Normal mood.    Condition at discharge: good  The results of significant diagnostics from this hospitalization (including imaging, microbiology, ancillary and laboratory) are listed below for reference.   Imaging Studies: CT ABDOMEN PELVIS W CONTRAST Result Date: 05/17/2024 CLINICAL DATA:  Diarrhea.  Abdominal pain. EXAM: CT ABDOMEN AND PELVIS WITH CONTRAST TECHNIQUE: Multidetector CT imaging of the abdomen and pelvis was performed using the standard protocol following bolus administration of intravenous contrast. RADIATION DOSE REDUCTION: This exam was performed according to the departmental dose-optimization program which includes automated exposure  control, adjustment of the mA and/or kV according to patient size and/or use of iterative reconstruction technique. CONTRAST:  80mL OMNIPAQUE  IOHEXOL  300 MG/ML  SOLN COMPARISON:  CT 06/20/2023, chest CT 05/15/2024, abdominopelvic CT 07/03/2022 FINDINGS: Lower chest: Left greater than right pleural effusions have developed from prior chest CT. Consolidation in the left lower lobe is greater than typically seen with atelectasis. Hepatobiliary: Normal appearance of the liver. Decompressed gallbladder. No calcified gallstone. No pericholecystic inflammation. No biliary dilatation. Pancreas: Unremarkable. No pancreatic ductal dilatation or surrounding inflammatory changes. Spleen: Normal in size without focal abnormality. Adrenals/Urinary Tract: Heterogeneous left adrenal nodule measures 2.3 x 2.1 cm, series 2, image 22 previously 2.3 x 1.9 cm. This has both hypodense and enhancing components.  The right adrenal gland is normal. No hydronephrosis or renal calculi. Scattered areas of scarring in the right kidney again seen. No suspicious renal lesion. Unremarkable urinary bladder. Stomach/Bowel: Decompressed stomach, equivocal areas of gastric wall thickening. No small bowel obstruction, wall thickening, or inflammation. Administered enteric contrast is seen throughout the colon. Colonic diverticulosis without diverticulitis. Sigmoid colon is redundant. The appendix is normal. Vascular/Lymphatic: Aortic atherosclerosis. No aortic aneurysm. Patent portal, splenic, and mesenteric veins. No abdominopelvic adenopathy. Reproductive: Atrophic uterus, normal for age. Suspected fluid in the vaginal canal. No adnexal mass. Other: Small amount of free fluid in the pelvis. No abdominal ascites. Small fat containing umbilical hernia. Musculoskeletal: Diffuse facet hypertrophy. Chronic bilateral hip arthropathy. There are no acute or suspicious osseous abnormalities. IMPRESSION: 1. Left greater than right pleural effusions have developed from prior chest CT. Consolidation in the left lower lobe is greater than typically seen with atelectasis, suspicious for pneumonia. 2. Equivocal areas of gastric wall thickening, can be seen with gastritis. 3. Colonic diverticulosis without diverticulitis. 4. Heterogeneous left adrenal nodule measures 2.3 x 2.1 cm, previously 2.3 x 1.9 cm. This has both hypodense and enhancing components. This is not significantly changed in size from January 2024 CT, favoring a benign etiology. 5. Small amount of free fluid in the pelvis, nonspecific. Aortic Atherosclerosis (ICD10-I70.0). Electronically Signed   By: Andrea Gasman M.D.   On: 05/17/2024 23:48   CT CHEST WO CONTRAST Result Date: 05/15/2024 EXAM: CT CHEST WITHOUT CONTRAST 05/15/2024 10:07:13 PM TECHNIQUE: CT of the chest was performed without the administration of intravenous contrast. Multiplanar reformatted images are provided  for review. Automated exposure control, iterative reconstruction, and/or weight based adjustment of the mA/kV was utilized to reduce the radiation dose to as low as reasonably achievable. COMPARISON: Comparison with prior CT dated 06/20/2023 and same day x-ray. CLINICAL HISTORY: Abnormal xray - lung opacity/opacities. FINDINGS: MEDIASTINUM: Heart and pericardium are unremarkable. Coronary artery and aortic atherosclerotic calcifications. Enlarged heterogeneous thyroid, 2 cm more than prior. Debris in the trachea extending into the bilateral mainstem bronchi. LYMPH NODES: No mediastinal, hilar or axillary lymphadenopathy. LUNGS AND PLEURA: Centrilobular and paraseptal emphysema. Irregular consolidation and associated interlobular septal thickening in the left upper lobe. The solid area of consolidation measures 2.9 x 2.3 cm on series 2 image 57. Bibasilar atelectasis/scarring. No pleural effusion or pneumothorax. SOFT TISSUES/BONES: No acute abnormality of the bones or soft tissues. UPPER ABDOMEN: Limited images of the upper abdomen demonstrates no acute abnormality. IMPRESSION: 1. Irregular left upper lobe consolidation with associated interlobular septal thickening measuring 2.9 x 2.3 cm. Primary differential considerations are pneumonia versus malignancy. Recommend short-interval non-contrast chest CT in 6-8 weeks to ensure resolution after antibiotic treatment. 2. Debris within the trachea extending into both mainstem bronchi, which may reflect  aspiration. 3. Enlarged heterogeneous thyroid. Non-emergent thyroid ultrasound is recommended if not performed previously. Electronically signed by: Norman Gatlin MD 05/15/2024 10:21 PM EST RP Workstation: [id number]   DG Chest Port 1 View Result Date: 05/15/2024 EXAM: 1 VIEW(S) XRAY OF THE CHEST 05/15/2024 08:30:57 PM COMPARISON: 06/20/2023 CLINICAL HISTORY: Questionable sepsis - evaluate for abnormality FINDINGS: LUNGS AND PLEURA: Interval development of hazy  airspace opacity within left mid lung with associated interstitial thickening in the left lung. No pleural effusion. No pneumothorax. HEART AND MEDIASTINUM: Atherosclerotic plaque noted. No acute abnormality of the cardiac and mediastinal silhouettes. BONES AND SOFT TISSUES: No acute osseous abnormality. IMPRESSION: 1. Interval development of hazy airspace opacity within the left mid lung with associated interstitial thickening. This is favored due to pneumonia however a mass is not excluded. Recommend CT for further evaluation. Electronically signed by: Norman Gatlin MD 05/15/2024 08:36 PM EST RP Workstation: [id number]    Microbiology: Results for orders placed or performed during the hospital encounter of 05/15/24  Blood Culture (routine x 2)     Status: None (Preliminary result)   Collection Time: 05/15/24  8:24 PM   Specimen: BLOOD LEFT WRIST  Result Value Ref Range Status   Specimen Description BLOOD LEFT WRIST  Final   Special Requests   Final    BOTTLES DRAWN AEROBIC AND ANAEROBIC Blood Culture adequate volume   Culture   Final    NO GROWTH 4 DAYS Performed at [facility], [address], [place], [place] [address]    Report Status PENDING  Incomplete  Resp panel by RT-PCR (RSV, Flu A&B, Covid) Anterior Nasal Swab     Status: None   Collection Time: 05/15/24  8:38 PM   Specimen: Anterior Nasal Swab  Result Value Ref Range Status   SARS Coronavirus 2 by RT PCR NEGATIVE NEGATIVE Final    Comment: (NOTE) SARS-CoV-2 target nucleic acids are NOT DETECTED.  The SARS-CoV-2 RNA is generally detectable in upper respiratory specimens during the acute phase of infection. The lowest concentration of SARS-CoV-2 viral copies this assay can detect is 138 copies/mL. A negative result does not preclude SARS-Cov-2 infection and should not be used as the sole basis for treatment or other patient management decisions. A negative result may occur with  improper specimen collection/handling,  submission of specimen other than nasopharyngeal swab, presence of viral mutation(s) within the areas targeted by this assay, and inadequate number of viral copies(<138 copies/mL). A negative result must be combined with clinical observations, patient history, and epidemiological information. The expected result is Negative.  Fact Sheet for Patients:  bloggercourse.com  Fact Sheet for Healthcare Providers:  seriousbroker.it  This test is no t yet approved or cleared by the [place]  FDA and  has been authorized for detection and/or diagnosis of SARS-CoV-2 by FDA under an Emergency Use Authorization (EUA). This EUA will remain  in effect (meaning this test can be used) for the duration of the COVID-19 declaration under Section 564(b)(1) of the Act, 21 U.S.C.section 360bbb-3(b)(1), unless the authorization is terminated  or revoked sooner.       Influenza A by PCR NEGATIVE NEGATIVE Final   Influenza B by PCR NEGATIVE NEGATIVE Final    Comment: (NOTE) The Xpert Xpress SARS-CoV-2/FLU/RSV plus assay is intended as an aid in the diagnosis of influenza from Nasopharyngeal swab specimens and should not be used as a sole basis for treatment. Nasal washings and aspirates are unacceptable for Xpert Xpress SARS-CoV-2/FLU/RSV testing.  Fact Sheet for Patients: bloggercourse.com  Fact  Sheet for Healthcare Providers: seriousbroker.it  This test is not yet approved or cleared by the [place]  FDA and has been authorized for detection and/or diagnosis of SARS-CoV-2 by FDA under an Emergency Use Authorization (EUA). This EUA will remain in effect (meaning this test can be used) for the duration of the COVID-19 declaration under Section 564(b)(1) of the Act, 21 U.S.C. section 360bbb-3(b)(1), unless the authorization is terminated or revoked.     Resp Syncytial Virus by PCR NEGATIVE  NEGATIVE Final    Comment: (NOTE) Fact Sheet for Patients: bloggercourse.com  Fact Sheet for Healthcare Providers: seriousbroker.it  This test is not yet approved or cleared by the [place]  FDA and has been authorized for detection and/or diagnosis of SARS-CoV-2 by FDA under an Emergency Use Authorization (EUA). This EUA will remain in effect (meaning this test can be used) for the duration of the COVID-19 declaration under Section 564(b)(1) of the Act, 21 U.S.C. section 360bbb-3(b)(1), unless the authorization is terminated or revoked.  Performed at [facility], [address] [place], [place] [address]   Blood Culture (routine x 2)     Status: None (Preliminary result)   Collection Time: 05/15/24  8:38 PM   Specimen: Right Antecubital; Blood  Result Value Ref Range Status   Specimen Description RIGHT ANTECUBITAL  Final   Special Requests   Final    BOTTLES DRAWN AEROBIC ONLY Blood Culture adequate volume   Culture   Final    NO GROWTH 4 DAYS Performed at [facility], [address], [place], [place] [address]    Report Status PENDING  Incomplete  C Difficile Quick Screen w PCR reflex     Status: None   Collection Time: 05/16/24  3:14 AM   Specimen: STOOL  Result Value Ref Range Status   C Diff antigen NEGATIVE NEGATIVE Final   C Diff toxin NEGATIVE NEGATIVE Final   C Diff interpretation No C. difficile detected.  Final    Comment: Performed at [facility], [address], [place], [place] [address]  Gastrointestinal Panel by PCR , Stool     Status: None   Collection Time: 05/16/24  2:40 PM   Specimen: Stool  Result Value Ref Range Status   Campylobacter species NOT DETECTED NOT DETECTED Final   Plesimonas shigelloides NOT DETECTED NOT DETECTED Final   Salmonella species NOT DETECTED NOT DETECTED Final   Yersinia enterocolitica NOT DETECTED NOT DETECTED Final   Vibrio species NOT DETECTED NOT DETECTED Final    Vibrio cholerae NOT DETECTED NOT DETECTED Final   Enteroaggregative E coli (EAEC) NOT DETECTED NOT DETECTED Final   Enteropathogenic E coli (EPEC) NOT DETECTED NOT DETECTED Final   Enterotoxigenic E coli (ETEC) NOT DETECTED NOT DETECTED Final   Shiga like toxin producing E coli (STEC) NOT DETECTED NOT DETECTED Final   Shigella/Enteroinvasive E coli (EIEC) NOT DETECTED NOT DETECTED Final   Cryptosporidium NOT DETECTED NOT DETECTED Final   Cyclospora cayetanensis NOT DETECTED NOT DETECTED Final   Entamoeba histolytica NOT DETECTED NOT DETECTED Final   Giardia lamblia NOT DETECTED NOT DETECTED Final   Adenovirus F40/41 NOT DETECTED NOT DETECTED Final   Astrovirus NOT DETECTED NOT DETECTED Final   Norovirus GI/GII NOT DETECTED NOT DETECTED Final   Rotavirus A NOT DETECTED NOT DETECTED Final   Sapovirus (I, II, IV, and V) NOT DETECTED NOT DETECTED Final    Comment: Performed at [facility], [address] [place], [place] [address]    Labs: CBC: Recent Labs  Lab 05/15/24 2024 05/16/24 0607 05/17/24 0453 05/18/24 0458 05/19/24 0429  WBC 13.1* 15.2* 10.7* 10.1 8.5  NEUTROABS 10.3*  --  8.4* 7.2 5.3  HGB 11.9* 11.1* 10.1* 9.1* 9.3*  HCT 36.2 33.5* 30.9* 27.6* 28.7*  MCV 93.5 93.6 93.9 93.9 93.2  PLT 160 147* 139* 149* 158   Basic Metabolic Panel: Recent Labs  Lab 05/15/24 2024 05/16/24 0607 05/17/24 0453 05/18/24 0458 05/19/24 0429  NA 137 139 142 139 141  K 3.3* 3.5 3.6 3.4* 3.5  CL 103 105 110 107 110  CO2 18* 20* 21* 20* 21*  GLUCOSE 99 88 53* 66* 116*  BUN 21 18 17 15 14   CREATININE 1.79* 1.59* 1.38* 1.35* 1.38*  CALCIUM  8.8* 8.4* 8.0* 7.9* 8.1*  MG  --  1.9  --   --   --   PHOS  --  2.8  --   --   --    Liver Function Tests: Recent Labs  Lab 05/15/24 2024 05/16/24 0607  AST 26 35  ALT 22 28  ALKPHOS 80 84  BILITOT 0.5 0.4  PROT 8.1 7.2  ALBUMIN 4.0 3.5   CBG: Recent Labs  Lab 05/15/24 2253  GLUCAP 105*    Discharge time spent: 40  minutes.  Signed: Deliliah Room, MD [facility] 05/20/2024

## 2024-05-20 NOTE — Plan of Care (Signed)

## 2024-05-20 NOTE — [place] FL2 (Signed)
 [place]  MEDICAID FL2 LEVEL OF CARE FORM     IDENTIFICATION  Patient Name: Cassidy Larson Birthdate: February 12, 1956 Sex: female Admission Date (Current Location): 05/15/2024  [facility] and [place] Number:  [employer] and Address:  [facility],  [address] [place] [address]      Provider Number: [id number]  Attending Physician Name and Address:  Dino Antu, MD  Relative Name and Phone Number:  Ascencion Drones (Legal Guardian) [phone]    Current Level of Care: Hospital Recommended Level of Care: [facility], Other (Comment) (Group home) Prior Approval Number:    Date Approved/Denied:   PASRR Number:    Discharge Plan: Other (Comment) (group home)    Current Diagnoses: Patient Active Problem List   Diagnosis Date Noted   Sepsis due to pneumonia (HCC) 05/15/2024   Prediabetes 05/21/2023   Subclinical hyperthyroidism 05/21/2023   History of MI (myocardial infarction) 03/20/2023   Memory difficulties 03/20/2023   Poor social situation 03/20/2023   Protein-calorie malnutrition, severe 07/06/2022   Cough 07/06/2022   Nasal congestion 07/06/2022   CKD (chronic kidney disease), stage III (HCC) 07/05/2022   Elevated liver enzymes 07/03/2022   Norovirus 07/03/2022   Hypotension 04/19/2022   CAD in native artery residual post LAD stent, 100% RCA and 80% LCx.  03/30/2022   S/P angioplasty with stent to mLAD 03/27/22 03/30/2022   HLD (hyperlipidemia) 03/30/2022   Malnutrition of moderate degree 03/30/2022   On mechanically assisted ventilation (HCC) extubated 03/28/22  03/28/2022   Acute respiratory failure with hypoxia (HCC) 03/28/2022   Cocaine abuse (HCC) 03/28/2022   Tobacco abuse 03/28/2022   Acute ST elevation myocardial infarction (STEMI) due to occlusion of left anterior descending (LAD) coronary artery (HCC)    Positive ANA (antinuclear antibody) 08/25/2017   CKD (chronic kidney disease) stage 4, GFR 15-29 ml/min (HCC)  08/18/2017   CKD (chronic kidney disease) 08/18/2017   Hypertension 08/17/2017   Headache 08/17/2017   Substance abuse (HCC) 08/17/2017   Essential hypertension 08/17/2017   Dysthymic disorder 07/13/2013   Polysubstance abuse (HCC) 07/13/2013    Orientation RESPIRATION BLADDER Height & Weight     Self, Situation, Time, Place  Normal Incontinent Weight: 99 lb 10.4 oz (45.2 kg) Height:  5' (152.4 cm)  BEHAVIORAL SYMPTOMS/MOOD NEUROLOGICAL BOWEL NUTRITION STATUS      Incontinent Diet (See DC summary)  AMBULATORY STATUS COMMUNICATION OF NEEDS Skin   Supervision Verbally Normal                       Personal Care Assistance Level of Assistance  Bathing, Feeding, Dressing Bathing Assistance: Limited assistance Feeding assistance: Independent Dressing Assistance: Limited assistance     Functional Limitations Info  Sight, Hearing, Speech Sight Info: Adequate Hearing Info: Adequate Speech Info: Adequate    SPECIAL CARE FACTORS FREQUENCY                       Contractures Contractures Info: Not present    Additional Factors Info  Code Status, Allergies, Psychotropic Code Status Info: DNR Allergies Info: Asa (Aspirin ), Latex Psychotropic Info: remeron, zoloft          Current Medications (05/20/2024):  This is the current hospital active medication list Current Facility-Administered Medications  Medication Dose Route Frequency Provider Last Rate Last Admin   acetaminophen  (TYLENOL ) tablet 650 mg  650 mg Oral Q6H PRN Adefeso, Oladapo, DO   650 mg at 05/19/24 2213   Or   acetaminophen  (  TYLENOL ) suppository 650 mg  650 mg Rectal Q6H PRN Adefeso, Oladapo, DO   650 mg at 05/16/24 1513   atorvastatin  (LIPITOR ) tablet 40 mg  40 mg Oral Daily Sigdel, Santosh, MD   40 mg at 05/20/24 0911   calcium  carbonate (OS-CAL - dosed in mg of elemental calcium ) tablet 1,250 mg  1 tablet Oral Q breakfast Adefeso, Oladapo, DO   1,250 mg at 05/20/24 0907   carvedilol  (COREG ) tablet  6.25 mg  6.25 mg Oral BID WC Sigdel, Santosh, MD   6.25 mg at 05/20/24 0907   ceFEPIme (MAXIPIME) 2 g in sodium chloride  0.9 % 100 mL IVPB  2 g Intravenous Q12H Sigdel, Santosh, MD 200 mL/hr at 05/20/24 0314 2 g at 05/20/24 0314   dextromethorphan -guaiFENesin  (MUCINEX  DM) 30-600 MG per 12 hr tablet 1 tablet  1 tablet Oral BID Adefeso, Oladapo, DO   1 tablet at 05/20/24 0907   enoxaparin  (LOVENOX ) injection 30 mg  30 mg Subcutaneous Q24H Adefeso, Oladapo, DO   30 mg at 05/20/24 0907   ipratropium-albuterol  (DUONEB) 0.5-2.5 (3) MG/3ML nebulizer solution 3 mL  3 mL Nebulization Q6H PRN Sigdel, Santosh, MD       isosorbide  mononitrate (IMDUR ) 24 hr tablet 30 mg  30 mg Oral Daily Sigdel, Santosh, MD   30 mg at 05/20/24 9092   loperamide  (IMODIUM ) capsule 2 mg  2 mg Oral PRN Rashid, Farhan, MD   2 mg at 05/19/24 2213   mirtazapine (REMERON) tablet 7.5 mg  7.5 mg Oral QHS Sigdel, Santosh, MD   7.5 mg at 05/19/24 2214   ondansetron  (ZOFRAN ) tablet 4 mg  4 mg Oral Q6H PRN Adefeso, Oladapo, DO       Or   ondansetron  (ZOFRAN ) injection 4 mg  4 mg Intravenous Q6H PRN Adefeso, Oladapo, DO       sertraline  (ZOLOFT ) tablet 100 mg  100 mg Oral Daily Sigdel, Santosh, MD   100 mg at 05/20/24 0907   ticagrelor  (BRILINTA ) tablet 90 mg  90 mg Oral BID Sigdel, Santosh, MD   90 mg at 05/20/24 0911     Discharge Medications: TAKE these medications     adalimumab 40 MG/0.8ML Ajkt pen Commonly known as: HUMIRA Inject 40 mg into the skin every 14 (fourteen) days.    amLODipine 2.5 MG tablet Commonly known as: NORVASC Take 2.5 mg by mouth daily.    amoxicillin -clavulanate 875-125 MG tablet Commonly known as: AUGMENTIN  Take 1 tablet by mouth 2 (two) times daily.    atorvastatin  40 MG tablet Commonly known as: LIPITOR  Take 40 mg by mouth at bedtime.    calcium  carbonate 1250 (500 Ca) MG tablet Commonly known as: OS-CAL - dosed in mg of elemental calcium  Take 1 tablet (1,250 mg total) by mouth daily with  breakfast. Start taking on: May 21, 2024    carvedilol  6.25 MG tablet Commonly known as: COREG  Take 1 tablet by mouth 2 (two) times daily with a meal.    dextromethorphan -guaiFENesin  30-600 MG 12hr tablet Commonly known as: MUCINEX  DM Take 1 tablet by mouth 2 (two) times daily.    GoodSense Pain Relief Extra St 500 MG tablet Generic drug: acetaminophen  Take 500 mg by mouth every 8 (eight) hours as needed for mild pain (pain score 1-3).    hydrALAZINE  25 MG tablet Commonly known as: APRESOLINE  Take 1 tablet by mouth 3 (three) times daily.    hydroxychloroquine 200 MG tablet Commonly known as: PLAQUENIL Take by mouth.    hydrOXYzine 50  MG tablet Commonly known as: ATARAX Take 50 mg by mouth 3 (three) times daily.    isosorbide  mononitrate 30 MG 24 hr tablet Commonly known as: IMDUR  Take 1 tablet (30 mg total) by mouth daily.    loperamide  2 MG capsule Commonly known as: IMODIUM  Take 1 capsule (2 mg total) by mouth as needed for diarrhea or loose stools.    mirtazapine  7.5 MG tablet Commonly known as: REMERON  Take 7.5 mg by mouth at bedtime.    sertraline  100 MG tablet Commonly known as: ZOLOFT  Take 100 mg by mouth daily.    ticagrelor  90 MG Tabs tablet Commonly known as: BRILINTA  Take 1 tablet (90 mg total) by mouth 2 (two) times daily.      Relevant Imaging Results:  Relevant Lab Results:   Additional Information SSN: [id number]  Hoy DELENA Bigness, LCSW

## 2024-05-20 NOTE — Progress Notes (Signed)
 Physical Therapy Treatment Patient Details Name: Cassidy Larson MRN: [id number] DOB: 1955/11/23 Today's Date: 05/20/2024   History of Present Illness Cassidy Larson is a 68 y.o. female with medical history significant of ASCVD and MI status post PCI October 2023, RA, hypertension, hyperlipidemia, CKD 3B, SLE who presents to the emergency department with complaint of 3 - 4 days of weakness, fatigue, shortness of breath, low back pain and diarrhea (multiple episodes daily).  She lives in a group home and was noted to be febrile today, so EMS was activated, on arrival of EMS team, she was noted to have an O2 sats of 90% on room air.  3 LPM of oxygen via [place] was provided she was taken to the ED for further evaluation.    PT Comments  Pt. Presented w/ general weakness in LE, Pt was able to tolerate session well. Pt was able to perform functional task throughout treatment on room air. Pt was able to perform bed mobility w/ CGA at EOB. Pt was able to transfer from bed to chair w/ RW w/ CGA/ min A. Pt was able to ambulate w/ RW forward/backwards w/ min A. Pt SpO2 reminded at 94% on room air, at the end of treatment. Pt was left in chair, w/ call bell at bedside and nursing staff was notified on pt status. Patient will benefit from continued skilled physical therapy in hospital and recommended venue below to increase strength, balance, endurance for safe ADLs and gait.     If plan is discharge home, recommend the following: A lot of help with walking and/or transfers;A lot of help with bathing/dressing/bathroom;Help with stairs or ramp for entrance;Assist for transportation;Assistance with cooking/housework   Can travel by private vehicle     Yes  Equipment Recommendations  Rolling walker (2 wheels)    Recommendations for Other Services       Precautions / Restrictions Precautions Precautions: Fall Recall of Precautions/Restrictions: Intact Restrictions Weight Bearing Restrictions Per Provider  Order: No     Mobility  Bed Mobility Overal bed mobility: Needs Assistance Bed Mobility: Supine to Sit     Supine to sit: Contact guard     General bed mobility comments: increased time, labored movement    Transfers Overall transfer level: Needs assistance Equipment used: Rolling walker (2 wheels) Transfers: Sit to/from Stand, Bed to chair/wheelchair/BSC Sit to Stand: Min assist   Step pivot transfers: Min assist       General transfer comment: labored; EOB to chair with RW    Ambulation/Gait Ambulation/Gait assistance: Min assist, Mod assist Gait Distance (Feet): 12 Feet Assistive device: Rolling walker (2 wheels) Gait Pattern/deviations: Decreased step length - right, Decreased step length - left, Decreased stride length Gait velocity: slow     General Gait Details: took a few steps forward/backwards at bedside before having to sit due to c/o fatigue and SOB, on room air with SpO2 initially 90% and increased to 93%   Stairs             Wheelchair Mobility     Tilt Bed    Modified Rankin (Stroke Patients Only)       Balance Overall balance assessment: Needs assistance Sitting-balance support: Feet supported, No upper extremity supported Sitting balance-Leahy Scale: Fair Sitting balance - Comments: seated at EOB   Standing balance support: During functional activity, Bilateral upper extremity supported Standing balance-Leahy Scale: Fair Standing balance comment: fair to poor with RW  Communication Communication Communication: No apparent difficulties  Cognition Arousal: Alert Behavior During Therapy: WFL for tasks assessed/performed   PT - Cognitive impairments: No apparent impairments                         Following commands: Intact      Cueing Cueing Techniques: Verbal cues, Tactile cues  Exercises      General Comments General comments (skin integrity, edema, etc.): room air for  functional task during treatment SpO2 reminded 88%-94%      Pertinent Vitals/Pain Pain Assessment Pain Assessment: Faces Faces Pain Scale: Hurts a little bit Pain Location: B LE Pain Descriptors / Indicators: Discomfort Pain Intervention(s): Monitored during session    Home Living                          Prior Function            PT Goals (current goals can now be found in the care plan section) Acute Rehab PT Goals Patient Stated Goal: return home with group home staff to assist PT Goal Formulation: With patient Time For Goal Achievement: 05/31/24 Potential to Achieve Goals: Good Progress towards PT goals: Progressing toward goals    Frequency    Min 3X/week      PT Plan      Co-evaluation              AM-PAC PT 6 Clicks Mobility   Outcome Measure  Help needed turning from your back to your side while in a flat bed without using bedrails?: A Little Help needed moving from lying on your back to sitting on the side of a flat bed without using bedrails?: A Little Help needed moving to and from a bed to a chair (including a wheelchair)?: A Little Help needed standing up from a chair using your arms (e.g., wheelchair or bedside chair)?: A Little Help needed to walk in hospital room?: A Lot Help needed climbing 3-5 steps with a railing? : A Lot 6 Click Score: 16    End of Session   Activity Tolerance: Patient tolerated treatment well;Patient limited by fatigue Patient left: in chair;with call bell/phone within reach;with chair alarm set Nurse Communication: Mobility status PT Visit Diagnosis: Unsteadiness on feet (R26.81);Other abnormalities of gait and mobility (R26.89);Muscle weakness (generalized) (M62.81)     Time: 9045-8974 PT Time Calculation (min) (ACUTE ONLY): 31 min  Charges:    $Therapeutic Exercise: 8-22 mins $Therapeutic Activity: 8-22 mins PT General Charges $$ ACUTE PT VISIT: 1 Visit                    Raynette Arras,  SPT

## 2024-05-20 NOTE — TOC Transition Note (Signed)
 Transition of Care [facility]) - Discharge Note   Patient Details  Name: Cassidy Larson MRN: [id number] Date of Birth: 1955/10/03  [facility] CM/SW Contact:  Hoy DELENA Bigness, LCSW Phone Number: 05/20/2024, 12:12 PM   Clinical Narrative:    Randie from [facility] assessed pt and determined she is able to return to their group home. FL2 and DC summary have been emailed to [facility] for review. Pt's legal guardian has been updated on discharge and is agreeable to plan. Marlene with group home to provide transportation for pt.   Final next level of care: Group Home Barriers to Discharge: Barriers Resolved   Patient Goals and CMS Choice Patient states their goals for this hospitalization and ongoing recovery are:: return to [facility] CMS Medicare.gov Compare Post Acute Care list provided to:: Patient Represenative (must comment) Choice offered to / list presented to : [facility] POA / Guardian [place] ownership interest in [facility].provided to:: [facility] POA / Guardian    Discharge Placement                       Discharge Plan and Services Additional resources added to the After Visit Summary for                  DME Arranged: N/A DME Agency: NA                  Social Drivers of Health (SDOH) Interventions SDOH Screenings   Food Insecurity: No Food Insecurity (05/15/2024)  Housing: Low Risk  (05/15/2024)  Transportation Needs: Unmet Transportation Needs (05/15/2024)  Utilities: Not At Risk (05/15/2024)  Depression (PHQ2-9): Low Risk  (03/09/2019)  Financial Resource Strain: Not on File (11/14/2022)   [facility] from [facility]  Physical Activity: Not on File (11/14/2022)   Received from [facility]  Social Connections: Socially Isolated (05/15/2024)  Stress: Not on File (11/14/2022)   Received from [facility]  Tobacco Use: High Risk (05/15/2024)     Readmission Risk Interventions    05/16/2024    2:59 PM  Readmission Risk Prevention Plan   Transportation Screening Complete  PCP or Specialist Appt within 5-7 Days Not Complete  Home Care Screening Complete  Medication Review (RN CM) Complete

## 2024-05-22 ENCOUNTER — Emergency Department (HOSPITAL_COMMUNITY)

## 2024-05-22 ENCOUNTER — Other Ambulatory Visit: Payer: Self-pay

## 2024-05-22 ENCOUNTER — Inpatient Hospital Stay (HOSPITAL_COMMUNITY)
Admission: EM | Admit: 2024-05-22 | Discharge: 2024-06-03 | Disposition: A | Source: Home / Self Care | Attending: Internal Medicine | Admitting: Internal Medicine

## 2024-05-22 ENCOUNTER — Encounter (HOSPITAL_COMMUNITY): Admission: EM | Disposition: A | Payer: Self-pay | Source: Home / Self Care | Attending: Internal Medicine

## 2024-05-22 DIAGNOSIS — N179 Acute kidney failure, unspecified: Secondary | ICD-10-CM | POA: Diagnosis not present

## 2024-05-22 DIAGNOSIS — I5023 Acute on chronic systolic (congestive) heart failure: Secondary | ICD-10-CM | POA: Diagnosis present

## 2024-05-22 DIAGNOSIS — R9431 Abnormal electrocardiogram [ECG] [EKG]: Secondary | ICD-10-CM | POA: Diagnosis present

## 2024-05-22 DIAGNOSIS — R079 Chest pain, unspecified: Secondary | ICD-10-CM | POA: Diagnosis not present

## 2024-05-22 DIAGNOSIS — J189 Pneumonia, unspecified organism: Secondary | ICD-10-CM | POA: Diagnosis present

## 2024-05-22 DIAGNOSIS — Z5982 Transportation insecurity: Secondary | ICD-10-CM

## 2024-05-22 DIAGNOSIS — Z79899 Other long term (current) drug therapy: Secondary | ICD-10-CM

## 2024-05-22 DIAGNOSIS — Z789 Other specified health status: Secondary | ICD-10-CM | POA: Diagnosis not present

## 2024-05-22 DIAGNOSIS — I214 Non-ST elevation (NSTEMI) myocardial infarction: Secondary | ICD-10-CM | POA: Diagnosis present

## 2024-05-22 DIAGNOSIS — F17210 Nicotine dependence, cigarettes, uncomplicated: Secondary | ICD-10-CM | POA: Diagnosis present

## 2024-05-22 DIAGNOSIS — R197 Diarrhea, unspecified: Secondary | ICD-10-CM | POA: Diagnosis not present

## 2024-05-22 DIAGNOSIS — Z720 Tobacco use: Secondary | ICD-10-CM | POA: Diagnosis present

## 2024-05-22 DIAGNOSIS — I130 Hypertensive heart and chronic kidney disease with heart failure and stage 1 through stage 4 chronic kidney disease, or unspecified chronic kidney disease: Secondary | ICD-10-CM | POA: Diagnosis present

## 2024-05-22 DIAGNOSIS — Z681 Body mass index (BMI) 19 or less, adult: Secondary | ICD-10-CM

## 2024-05-22 DIAGNOSIS — I5082 Biventricular heart failure: Secondary | ICD-10-CM | POA: Diagnosis present

## 2024-05-22 DIAGNOSIS — Z66 Do not resuscitate: Secondary | ICD-10-CM | POA: Diagnosis not present

## 2024-05-22 DIAGNOSIS — N1832 Chronic kidney disease, stage 3b: Secondary | ICD-10-CM

## 2024-05-22 DIAGNOSIS — R64 Cachexia: Secondary | ICD-10-CM | POA: Diagnosis present

## 2024-05-22 DIAGNOSIS — I5021 Acute systolic (congestive) heart failure: Secondary | ICD-10-CM | POA: Diagnosis not present

## 2024-05-22 DIAGNOSIS — R7989 Other specified abnormal findings of blood chemistry: Secondary | ICD-10-CM | POA: Diagnosis present

## 2024-05-22 DIAGNOSIS — R52 Pain, unspecified: Secondary | ICD-10-CM | POA: Diagnosis not present

## 2024-05-22 DIAGNOSIS — A419 Sepsis, unspecified organism: Secondary | ICD-10-CM | POA: Diagnosis not present

## 2024-05-22 DIAGNOSIS — M329 Systemic lupus erythematosus, unspecified: Secondary | ICD-10-CM | POA: Diagnosis present

## 2024-05-22 DIAGNOSIS — D638 Anemia in other chronic diseases classified elsewhere: Secondary | ICD-10-CM | POA: Diagnosis present

## 2024-05-22 DIAGNOSIS — L299 Pruritus, unspecified: Secondary | ICD-10-CM | POA: Diagnosis not present

## 2024-05-22 DIAGNOSIS — M069 Rheumatoid arthritis, unspecified: Secondary | ICD-10-CM | POA: Diagnosis present

## 2024-05-22 DIAGNOSIS — I959 Hypotension, unspecified: Secondary | ICD-10-CM | POA: Diagnosis not present

## 2024-05-22 DIAGNOSIS — Z7189 Other specified counseling: Secondary | ICD-10-CM | POA: Diagnosis not present

## 2024-05-22 DIAGNOSIS — A481 Legionnaires' disease: Secondary | ICD-10-CM | POA: Diagnosis present

## 2024-05-22 DIAGNOSIS — R748 Abnormal levels of other serum enzymes: Secondary | ICD-10-CM

## 2024-05-22 DIAGNOSIS — Z886 Allergy status to analgesic agent status: Secondary | ICD-10-CM

## 2024-05-22 DIAGNOSIS — Z7902 Long term (current) use of antithrombotics/antiplatelets: Secondary | ICD-10-CM

## 2024-05-22 DIAGNOSIS — Z716 Tobacco abuse counseling: Secondary | ICD-10-CM

## 2024-05-22 DIAGNOSIS — Z515 Encounter for palliative care: Secondary | ICD-10-CM | POA: Diagnosis not present

## 2024-05-22 DIAGNOSIS — I5033 Acute on chronic diastolic (congestive) heart failure: Secondary | ICD-10-CM | POA: Diagnosis present

## 2024-05-22 DIAGNOSIS — Z888 Allergy status to other drugs, medicaments and biological substances status: Secondary | ICD-10-CM

## 2024-05-22 DIAGNOSIS — Z79620 Long term (current) use of immunosuppressive biologic: Secondary | ICD-10-CM

## 2024-05-22 DIAGNOSIS — I2510 Atherosclerotic heart disease of native coronary artery without angina pectoris: Secondary | ICD-10-CM | POA: Diagnosis not present

## 2024-05-22 DIAGNOSIS — R627 Adult failure to thrive: Secondary | ICD-10-CM | POA: Diagnosis present

## 2024-05-22 DIAGNOSIS — F341 Dysthymic disorder: Secondary | ICD-10-CM | POA: Diagnosis present

## 2024-05-22 DIAGNOSIS — A4159 Other Gram-negative sepsis: Principal | ICD-10-CM | POA: Diagnosis present

## 2024-05-22 DIAGNOSIS — Z955 Presence of coronary angioplasty implant and graft: Secondary | ICD-10-CM

## 2024-05-22 DIAGNOSIS — Z91040 Latex allergy status: Secondary | ICD-10-CM

## 2024-05-22 DIAGNOSIS — E278 Other specified disorders of adrenal gland: Secondary | ICD-10-CM | POA: Diagnosis present

## 2024-05-22 DIAGNOSIS — E782 Mixed hyperlipidemia: Secondary | ICD-10-CM | POA: Diagnosis present

## 2024-05-22 DIAGNOSIS — I340 Nonrheumatic mitral (valve) insufficiency: Secondary | ICD-10-CM | POA: Diagnosis present

## 2024-05-22 DIAGNOSIS — J9601 Acute respiratory failure with hypoxia: Secondary | ICD-10-CM | POA: Diagnosis present

## 2024-05-22 DIAGNOSIS — F419 Anxiety disorder, unspecified: Secondary | ICD-10-CM | POA: Diagnosis present

## 2024-05-22 DIAGNOSIS — E785 Hyperlipidemia, unspecified: Secondary | ICD-10-CM | POA: Diagnosis present

## 2024-05-22 DIAGNOSIS — D649 Anemia, unspecified: Secondary | ICD-10-CM | POA: Diagnosis present

## 2024-05-22 DIAGNOSIS — I252 Old myocardial infarction: Secondary | ICD-10-CM | POA: Diagnosis not present

## 2024-05-22 DIAGNOSIS — R54 Age-related physical debility: Secondary | ICD-10-CM | POA: Diagnosis present

## 2024-05-22 DIAGNOSIS — R636 Underweight: Secondary | ICD-10-CM | POA: Diagnosis present

## 2024-05-22 DIAGNOSIS — N183 Chronic kidney disease, stage 3 unspecified: Secondary | ICD-10-CM | POA: Diagnosis present

## 2024-05-22 LAB — CBC WITH DIFFERENTIAL/PLATELET
Abs Immature Granulocytes: 0.17 K/uL — ABNORMAL HIGH (ref 0.00–0.07)
Abs Immature Granulocytes: 0.14 K/uL — ABNORMAL HIGH (ref 0.00–0.07)
Basophils Absolute: 0 K/uL (ref 0.0–0.1)
Basophils Absolute: 0 K/uL (ref 0.0–0.1)
Basophils Relative: 0 %
Basophils Relative: 0 %
Eosinophils Absolute: 0.5 K/uL (ref 0.0–0.5)
Eosinophils Absolute: 0.2 K/uL (ref 0.0–0.5)
Eosinophils Relative: 4 %
Eosinophils Relative: 2 %
HCT: 28.6 % — ABNORMAL LOW (ref 36.0–46.0)
HCT: 26.6 % — ABNORMAL LOW (ref 36.0–46.0)
Hemoglobin: 9.1 g/dL — ABNORMAL LOW (ref 12.0–15.0)
Hemoglobin: 8.8 g/dL — ABNORMAL LOW (ref 12.0–15.0)
Immature Granulocytes: 2 %
Immature Granulocytes: 1 %
Lymphocytes Relative: 15 %
Lymphocytes Relative: 10 %
Lymphs Abs: 1.6 K/uL (ref 0.7–4.0)
Lymphs Abs: 1.1 K/uL (ref 0.7–4.0)
MCH: 29.8 pg (ref 26.0–34.0)
MCH: 30.7 pg (ref 26.0–34.0)
MCHC: 31.8 g/dL (ref 30.0–36.0)
MCHC: 33.1 g/dL (ref 30.0–36.0)
MCV: 93.8 fL (ref 80.0–100.0)
MCV: 92.7 fL (ref 80.0–100.0)
Monocytes Absolute: 1.1 K/uL — ABNORMAL HIGH (ref 0.1–1.0)
Monocytes Absolute: 1 K/uL (ref 0.1–1.0)
Monocytes Relative: 11 %
Monocytes Relative: 9 %
Neutro Abs: 7.3 K/uL (ref 1.7–7.7)
Neutro Abs: 8.3 K/uL — ABNORMAL HIGH (ref 1.7–7.7)
Neutrophils Relative %: 68 %
Neutrophils Relative %: 78 %
Platelets: 284 K/uL (ref 150–400)
Platelets: 265 K/uL (ref 150–400)
RBC: 3.05 MIL/uL — ABNORMAL LOW (ref 3.87–5.11)
RBC: 2.87 MIL/uL — ABNORMAL LOW (ref 3.87–5.11)
RDW: 13.8 % (ref 11.5–15.5)
RDW: 13.8 % (ref 11.5–15.5)
WBC: 10.7 K/uL — ABNORMAL HIGH (ref 4.0–10.5)
WBC: 10.8 K/uL — ABNORMAL HIGH (ref 4.0–10.5)
nRBC: 0.7 % — ABNORMAL HIGH (ref 0.0–0.2)
nRBC: 0.4 % — ABNORMAL HIGH (ref 0.0–0.2)

## 2024-05-22 LAB — LIPID PANEL
Cholesterol: 95 mg/dL (ref 0–200)
HDL: 18 mg/dL — ABNORMAL LOW (ref >40)
LDL Cholesterol: 43 mg/dL (ref 0–99)
Total CHOL/HDL Ratio: 5.3 ratio
Triglycerides: 173 mg/dL — ABNORMAL HIGH (ref <150)
VLDL: 35 mg/dL (ref 0–40)

## 2024-05-22 LAB — PROTIME-INR
INR: 1.3 — ABNORMAL HIGH (ref 0.8–1.2)
Prothrombin Time: 17 s — ABNORMAL HIGH (ref 11.4–15.2)

## 2024-05-22 LAB — BRAIN NATRIURETIC PEPTIDE: B Natriuretic Peptide: 801.9 pg/mL — ABNORMAL HIGH (ref 0.0–100.0)

## 2024-05-22 LAB — RAPID URINE DRUG SCREEN, HOSP PERFORMED
Amphetamines: NOT DETECTED
Barbiturates: NOT DETECTED
Benzodiazepines: NOT DETECTED
Cocaine: NOT DETECTED
Opiates: NOT DETECTED
Tetrahydrocannabinol: NOT DETECTED

## 2024-05-22 LAB — LEGIONELLA PNEUMOPHILA SEROGP 1 UR AG: L. pneumophila Serogp 1 Ur Ag: POSITIVE — AB

## 2024-05-22 LAB — COMPREHENSIVE METABOLIC PANEL WITH GFR
ALT: 52 U/L — ABNORMAL HIGH (ref 0–44)
AST: 47 U/L — ABNORMAL HIGH (ref 15–41)
Albumin: 3.2 g/dL — ABNORMAL LOW (ref 3.5–5.0)
Alkaline Phosphatase: 105 U/L (ref 38–126)
Anion gap: 13 (ref 5–15)
BUN: 22 mg/dL (ref 8–23)
CO2: 17 mmol/L — ABNORMAL LOW (ref 22–32)
Calcium: 8.3 mg/dL — ABNORMAL LOW (ref 8.9–10.3)
Chloride: 110 mmol/L (ref 98–111)
Creatinine, Ser: 1.54 mg/dL — ABNORMAL HIGH (ref 0.44–1.00)
GFR, Estimated: 37 mL/min — ABNORMAL LOW (ref >60)
Glucose, Bld: 168 mg/dL — ABNORMAL HIGH (ref 70–99)
Potassium: 3.7 mmol/L (ref 3.5–5.1)
Sodium: 140 mmol/L (ref 135–145)
Total Bilirubin: 0.3 mg/dL (ref 0.0–1.2)
Total Protein: 6.9 g/dL (ref 6.5–8.1)

## 2024-05-22 LAB — MRSA NEXT GEN BY PCR, NASAL: MRSA by PCR Next Gen: NOT DETECTED

## 2024-05-22 LAB — APTT: aPTT: 39 s — ABNORMAL HIGH (ref 24–36)

## 2024-05-22 LAB — TROPONIN I (HIGH SENSITIVITY)
Troponin I (High Sensitivity): 1135 ng/L (ref <18)
Troponin I (High Sensitivity): 2686 ng/L (ref <18)

## 2024-05-22 LAB — PROCALCITONIN: Procalcitonin: 0.17 ng/mL

## 2024-05-22 LAB — TROPONIN T, HIGH SENSITIVITY: Troponin T High Sensitivity: 61 ng/L — ABNORMAL HIGH (ref 0–19)

## 2024-05-22 LAB — LACTIC ACID, PLASMA: Lactic Acid, Venous: 1 mmol/L (ref 0.5–1.9)

## 2024-05-22 SURGERY — CORONARY/GRAFT ACUTE MI REVASCULARIZATION
Anesthesia: LOCAL

## 2024-05-22 MED ORDER — HEPARIN (PORCINE) 25000 UT/250ML-% IV SOLN
850.0000 [IU]/h | INTRAVENOUS | Status: DC
  Administered 2024-05-22: 650 [IU]/h via INTRAVENOUS
  Administered 2024-05-23: 750 [IU]/h via INTRAVENOUS
  Administered 2024-05-25: 850 [IU]/h via INTRAVENOUS
  Filled 2024-05-22 (×3): qty 250

## 2024-05-22 MED ORDER — MORPHINE SULFATE (PF) 2 MG/ML IV SOLN
2.0000 mg | INTRAVENOUS | Status: AC | PRN
  Administered 2024-05-22 – 2024-05-24 (×3): 2 mg via INTRAVENOUS
  Filled 2024-05-22 (×3): qty 1

## 2024-05-22 MED ORDER — FENTANYL CITRATE (PF) 50 MCG/ML IJ SOSY
25.0000 ug | PREFILLED_SYRINGE | INTRAMUSCULAR | Status: DC | PRN
  Administered 2024-05-22: 25 ug via INTRAVENOUS
  Filled 2024-05-22: qty 1

## 2024-05-22 MED ORDER — ALBUTEROL SULFATE (2.5 MG/3ML) 0.083% IN NEBU: 2.5000 mg | INHALATION_SOLUTION | RESPIRATORY_TRACT | Status: DC | PRN

## 2024-05-22 MED ORDER — SODIUM CHLORIDE 0.9 % IV SOLN: INTRAVENOUS | Status: DC

## 2024-05-22 MED ORDER — IOHEXOL 350 MG/ML SOLN
75.0000 mL | Freq: Once | INTRAVENOUS | Status: AC | PRN
  Administered 2024-05-22: 75 mL via INTRAVENOUS

## 2024-05-22 MED ORDER — ACETAMINOPHEN 325 MG PO TABS: 650.0000 mg | ORAL_TABLET | Freq: Four times a day (QID) | ORAL | Status: DC | PRN

## 2024-05-22 MED ORDER — SODIUM CHLORIDE 0.9 % IV SOLN
2.0000 g | Freq: Once | INTRAVENOUS | Status: AC
  Administered 2024-05-22: 2 g via INTRAVENOUS
  Filled 2024-05-22: qty 12.5

## 2024-05-22 MED ORDER — ATORVASTATIN CALCIUM 40 MG PO TABS
40.0000 mg | ORAL_TABLET | Freq: Every day | ORAL | Status: DC
  Administered 2024-05-22 – 2024-05-26 (×5): 40 mg via ORAL
  Filled 2024-05-22 (×5): qty 1

## 2024-05-22 MED ORDER — SODIUM CHLORIDE 0.9 % IV BOLUS
500.0000 mL | Freq: Once | INTRAVENOUS | Status: AC
  Administered 2024-05-22: 500 mL via INTRAVENOUS

## 2024-05-22 MED ORDER — SODIUM CHLORIDE 0.9 % IV SOLN
100.0000 mg | Freq: Two times a day (BID) | INTRAVENOUS | Status: DC
  Administered 2024-05-22 (×2): 100 mg via INTRAVENOUS
  Filled 2024-05-22 (×3): qty 100

## 2024-05-22 MED ORDER — SERTRALINE HCL 100 MG PO TABS
100.0000 mg | ORAL_TABLET | Freq: Every day | ORAL | Status: DC
  Administered 2024-05-22 – 2024-06-01 (×10): 100 mg via ORAL
  Filled 2024-05-22 (×13): qty 1

## 2024-05-22 MED ORDER — HEPARIN SODIUM (PORCINE) 5000 UNIT/ML IJ SOLN
60.0000 [IU]/kg | Freq: Once | INTRAMUSCULAR | Status: AC
  Administered 2024-05-22: 2700 [IU] via INTRAVENOUS
  Filled 2024-05-22: qty 1

## 2024-05-22 MED ORDER — FENTANYL CITRATE (PF) 50 MCG/ML IJ SOSY
50.0000 ug | PREFILLED_SYRINGE | INTRAMUSCULAR | Status: DC | PRN
  Administered 2024-05-22: 50 ug via INTRAVENOUS
  Filled 2024-05-22: qty 1

## 2024-05-22 MED ORDER — HEPARIN BOLUS VIA INFUSION
3000.0000 [IU] | Freq: Once | INTRAVENOUS | Status: AC
  Administered 2024-05-22: 3000 [IU] via INTRAVENOUS
  Filled 2024-05-22: qty 3000

## 2024-05-22 MED ORDER — TRIMETHOBENZAMIDE HCL 100 MG/ML IM SOLN: 200.0000 mg | Freq: Four times a day (QID) | INTRAMUSCULAR | Status: DC | PRN

## 2024-05-22 MED ORDER — FUROSEMIDE 10 MG/ML IJ SOLN: 20.0000 mg | Freq: Two times a day (BID) | INTRAMUSCULAR | Status: DC

## 2024-05-22 MED ORDER — ENOXAPARIN SODIUM 30 MG/0.3ML IJ SOSY: 30.0000 mg | PREFILLED_SYRINGE | INTRAMUSCULAR | Status: DC

## 2024-05-22 MED ORDER — GUAIFENESIN ER 600 MG PO TB12
600.0000 mg | ORAL_TABLET | Freq: Two times a day (BID) | ORAL | Status: DC
  Administered 2024-05-22 – 2024-06-02 (×19): 600 mg via ORAL
  Filled 2024-05-22 (×24): qty 1

## 2024-05-22 MED ORDER — ENOXAPARIN SODIUM 40 MG/0.4ML IJ SOSY: 40.0000 mg | PREFILLED_SYRINGE | INTRAMUSCULAR | Status: DC

## 2024-05-22 MED ORDER — SODIUM CHLORIDE 0.9 % IV SOLN: 2.0000 g | INTRAVENOUS | Status: DC

## 2024-05-22 MED ORDER — ASPIRIN 81 MG PO CHEW
324.0000 mg | CHEWABLE_TABLET | Freq: Once | ORAL | Status: DC
  Filled 2024-05-22: qty 4

## 2024-05-22 MED ORDER — TICAGRELOR 90 MG PO TABS
90.0000 mg | ORAL_TABLET | Freq: Two times a day (BID) | ORAL | Status: DC
  Administered 2024-05-22 – 2024-05-26 (×9): 90 mg via ORAL
  Filled 2024-05-22 (×9): qty 1

## 2024-05-22 MED ORDER — GERHARDT'S BUTT CREAM
TOPICAL_CREAM | Freq: Two times a day (BID) | CUTANEOUS | Status: DC
  Administered 2024-05-23: 1 via TOPICAL
  Filled 2024-05-22 (×3): qty 60

## 2024-05-22 MED ORDER — ACETAMINOPHEN 650 MG RE SUPP: 650.0000 mg | Freq: Four times a day (QID) | RECTAL | Status: DC | PRN

## 2024-05-22 MED ORDER — SODIUM CHLORIDE 0.9% FLUSH
3.0000 mL | Freq: Two times a day (BID) | INTRAVENOUS | Status: DC
  Administered 2024-05-22 – 2024-06-03 (×24): 3 mL via INTRAVENOUS

## 2024-05-22 NOTE — ED Provider Notes (Addendum)
 Accepted handoff at shift change from MD [facility]. Please see prior provider note for more detail.   Briefly: Patient is 68 y.o.   Patient came in ill-appearing 2 in the [facility] and was transferred for concern for STEMI, STEMI doctor evaluated at bedside and suspect more NSTEMI Believe this is secondary to pneumonia.  She is recently discharged for this.   Plan: Admission    Physical Exam  BP 105/63   Pulse 79   Temp (!) 97.4 F (36.3 C) (Rectal)   Resp 17   Ht 5' (1.524 m)   Wt 45.5 kg   SpO2 93%   BMI 19.59 kg/m   Physical Exam Vitals and nursing note reviewed.  Constitutional:      General: She is not in acute distress.    Appearance: She is ill-appearing.  HENT:     Head: Normocephalic and atraumatic.     Nose: Nose normal.  Eyes:     General: No scleral icterus. Cardiovascular:     Rate and Rhythm: Normal rate and regular rhythm.     Pulses: Normal pulses.     Heart sounds: Normal heart sounds.     Comments: Bilateral radial artery pulses symmetric Pulmonary:     Effort: Pulmonary effort is normal. No respiratory distress.     Breath sounds: No wheezing.  Abdominal:     Palpations: Abdomen is soft.     Tenderness: There is no abdominal tenderness.  Musculoskeletal:     Cervical back: Normal range of motion.     Right lower leg: No edema.     Left lower leg: No edema.  Skin:    General: Skin is warm and dry.     Capillary Refill: Capillary refill takes less than 2 seconds.  Neurological:     Mental Status: She is alert. Mental status is at baseline.  Psychiatric:        Mood and Affect: Mood normal.        Behavior: Behavior normal.     Procedures  .Critical Care  Performed by: Neldon Hamp RAMAN, PA Authorized by: Neldon Hamp RAMAN, PA   Critical care provider statement:    Critical care time (minutes):  35   Critical care time was exclusive of:  Separately billable procedures and treating other patients and teaching time   Critical care was  necessary to treat or prevent imminent or life-threatening deterioration of the following conditions:  Sepsis (NSTEMI)   Critical care was time spent personally by me on the following activities:  Development of treatment plan with patient or surrogate, review of old charts, re-evaluation of patient's condition, pulse oximetry, ordering and review of radiographic studies, ordering and review of laboratory studies, ordering and performing treatments and interventions, obtaining history from patient or surrogate, examination of patient and evaluation of patient's response to treatment   Care discussed with: admitting provider     ED Course / MDM   Clinical Course as of 05/22/24 1057  Sat May 22, 2024  1053 IMPRESSION: 1. No evidence of acute aortic syndrome. 2. Progressive left upper lobe pneumonia. 3. Diffuse pulmonary edema and small left pleural effusion. Reflux of contrast material into the IVC and hepatic veins consistent with right heart failure. 4. No central pulmonary arterial occlusion. 5. Enlarging mediastinal lymph nodes are nonspecific in the setting of chf and pneumonia. 6. 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 Consider follow-up imaging with repeat CT of the chest  in 1 to 3 months to ensure stability and/or resolution. 7. Aortic atherosclerotic calcifications and coronary artery calcifications. 8. Progressive increased size of bilateral inguinal and left external iliac lymph nodes compared with 05/17/2024. Nonspecific but favor reactive changes.  Electronically signed by: Waddell Calk MD 05/22/2024 10:27 AM EST RP Workstation: [id number]   [WF]    Clinical Course User Index [WF] Neldon Hamp RAMAN, PA   Medical Decision Making Amount and/or Complexity of Data Reviewed Labs: ordered. Radiology: ordered.  Risk OTC drugs. Prescription drug management. Decision regarding  hospitalization.   CT dissection study negative.  Persistent/progressive left upper lobe pneumonia.  Will discuss with hospitalist for antibiosis preference.  Hypotension has resolved with IV fluids.  Second troponin pending.  Cefepime  ordered  Discussed with Dr. Claudene who will admit.     Neldon Hamp RAMAN, [place] 05/22/24 1114    Neldon Hamp [place], [place] 05/22/24 1114    Simon Lavonia SAILOR, MD 05/22/24 1143

## 2024-05-22 NOTE — ED Triage Notes (Signed)
 Pt brought in by [facility] from [facility]. Reports of chest pain and back pain. EMS noted hypotension (70/40). Pt alert, and oriented to her baseline, which is oriented to person and place only. Incontinent. Ambulatory with 1 person assist.

## 2024-05-22 NOTE — ED Provider Notes (Signed)
 [place] EMERGENCY DEPARTMENT AT [facility] Provider Note   CSN: [id number] Arrival date & time: 05/22/24  9298     Patient presents with: No chief complaint on file.   Cassidy Larson is a 68 y.o. female.   HPI   This patient is a very ill-appearing 68 year old female with a history of multiple medical problems including rheumatoid arthritis on Humira, high cholesterol on Lipitor , heart disease on carvedilol  and Imdur  and Brilinta , evidently she has stage IV chronic kidney disease, she had an echocardiogram most recently in November 2023 and had a left heart catheterization in October of that same year.  The left heart catheterization showed that the RCA had a 100% stenosis, circumflex had 80% mid LAD was 100% and a drug-eluting stent was placed  Echocardiogram showed ejection fraction of 50 to 55%  She presents with acute onset of chest pain which started this morning when she woke up, it was initially in the middle of her chest but now is radiated around to the left flank.  This is quite severe the patient appears very uncomfortable, she was noted to have a blood pressure of 60/40 by paramedics.  They initiated IV fluids.  No medications given.  Prior to Admission medications   Medication Sig Start Date End Date Taking? Authorizing Provider  adalimumab (HUMIRA) 40 MG/0.8ML AJKT pen Inject 40 mg into the skin every 14 (fourteen) days. 09/01/23   [provider]  amLODipine (NORVASC) 2.5 MG tablet Take 2.5 mg by mouth daily.    [provider]  amoxicillin -clavulanate (AUGMENTIN ) 875-125 MG tablet Take 1 tablet by mouth 2 (two) times daily. 05/20/24   Rashid, Farhan, MD  atorvastatin  (LIPITOR ) 40 MG tablet Take 40 mg by mouth at bedtime. 05/20/23   [provider]  calcium  carbonate (OS-CAL - DOSED IN MG OF ELEMENTAL CALCIUM ) 1250 (500 Ca) MG tablet Take 1 tablet (1,250 mg total) by mouth daily with breakfast. 05/21/24   Rashid, Farhan, MD   carvedilol  (COREG ) 6.25 MG tablet Take 1 tablet by mouth 2 (two) times daily with a meal. 05/20/23   [provider]  dextromethorphan -guaiFENesin  (MUCINEX  DM) 30-600 MG 12hr tablet Take 1 tablet by mouth 2 (two) times daily. 05/20/24   Rashid, Farhan, MD  GOODSENSE PAIN RELIEF EXTRA ST 500 MG tablet Take 500 mg by mouth every 8 (eight) hours as needed for mild pain (pain score 1-3). 04/16/24   [provider]  hydrALAZINE  (APRESOLINE ) 25 MG tablet Take 1 tablet by mouth 3 (three) times daily. 05/20/23   [provider]  hydroxychloroquine (PLAQUENIL) 200 MG tablet Take by mouth. 08/08/23   [provider]  hydrOXYzine (ATARAX) 50 MG tablet Take 50 mg by mouth 3 (three) times daily. 04/20/24   [provider]  isosorbide  mononitrate (IMDUR ) 30 MG 24 hr tablet Take 1 tablet (30 mg total) by mouth daily. 03/21/23   Ula Prentice SAUNDERS, MD  loperamide  (IMODIUM ) 2 MG capsule Take 1 capsule (2 mg total) by mouth as needed for diarrhea or loose stools. 05/20/24   Rashid, Farhan, MD  mirtazapine  (REMERON ) 7.5 MG tablet Take 7.5 mg by mouth at bedtime. 04/20/24   [provider]  sertraline  (ZOLOFT ) 100 MG tablet Take 100 mg by mouth daily. 04/20/24   [provider]  ticagrelor  (BRILINTA ) 90 MG TABS tablet Take 1 tablet (90 mg total) by mouth 2 (two) times daily. 03/21/23   Ula Prentice SAUNDERS, MD    Allergies: Asa [aspirin ], Latex, and Other  Review of Systems  All other systems reviewed and are negative.   Updated Vital Signs BP (!) 67/46   Pulse 79   Resp 20   SpO2 (!) 81%   Physical Exam Vitals and nursing note reviewed.  Constitutional:      General: She is in acute distress.     Appearance: She is well-developed. She is ill-appearing.  HENT:     Head: Normocephalic and atraumatic.     Mouth/Throat:     Mouth: Mucous membranes are moist.     Pharynx: No oropharyngeal exudate.  Eyes:     General: No scleral icterus.       Right eye: No  discharge.        Left eye: No discharge.     Conjunctiva/sclera: Conjunctivae normal.     Pupils: Pupils are equal, round, and reactive to light.  Neck:     Thyroid: No thyromegaly.     Vascular: No JVD.  Cardiovascular:     Rate and Rhythm: Normal rate and regular rhythm.     Heart sounds: Normal heart sounds. No murmur heard.    No friction rub. No gallop.  Pulmonary:     Effort: Pulmonary effort is normal. No respiratory distress.     Breath sounds: Normal breath sounds. No wheezing or rales.  Abdominal:     General: Bowel sounds are normal. There is no distension.     Palpations: Abdomen is soft. There is no mass.     Tenderness: There is no abdominal tenderness.  Musculoskeletal:        General: No tenderness. Normal range of motion.     Cervical back: Normal range of motion and neck supple.     Right lower leg: No edema.     Left lower leg: No edema.  Lymphadenopathy:     Cervical: No cervical adenopathy.  Skin:    General: Skin is warm and dry.     Findings: No erythema or rash.  Neurological:     Mental Status: She is alert.     Coordination: Coordination normal.  Psychiatric:        Behavior: Behavior normal.     (all labs ordered are listed, but only abnormal results are displayed) Labs Reviewed  HEMOGLOBIN A1C  CBC WITH DIFFERENTIAL/PLATELET  PROTIME-INR  APTT  COMPREHENSIVE METABOLIC PANEL WITH GFR  LIPID PANEL  LACTIC ACID, PLASMA  TROPONIN T, HIGH SENSITIVITY    EKG: EKG Interpretation Date/Time:  Saturday May 22 2024 07:12:29 EST Ventricular Rate:  75 PR Interval:  108 QRS Duration:  116 QT Interval:  399 QTC Calculation: 446 R Axis:   80  Text Interpretation: Posterior EKG Short PR interval Nonspecific intraventricular conduction delay Anterolateral infarct, age indeterminate ST elevation lateral precordial leads Confirmed by Cleotilde Rogue ([id number]) on 05/22/2024 7:19:46 AM  Radiology: No results found.   .Critical Care  Performed  by: Cleotilde Rogue, MD Authorized by: Cleotilde Rogue, MD   Critical care provider statement:    Critical care time (minutes):  45   Critical care time was exclusive of:  Separately billable procedures and treating other patients and teaching time   Critical care was necessary to treat or prevent imminent or life-threatening deterioration of the following conditions:  Cardiac failure   Critical care was time spent personally by me on the following activities:  Development of treatment plan with patient or surrogate, discussions with consultants, evaluation of patient's response to treatment, examination of patient, obtaining history from patient or  surrogate, review of old charts, re-evaluation of patient's condition, pulse oximetry, ordering and review of radiographic studies, ordering and review of laboratory studies and ordering and performing treatments and interventions   I assumed direction of critical care for this patient from another provider in my specialty: no     Care discussed with: admitting provider   Comments:          Medications Ordered in the ED  0.9 %  sodium chloride  infusion (has no administration in time range)  aspirin  chewable tablet 324 mg (has no administration in time range)  heparin  injection 2,700 Units (has no administration in time range)                                    Medical Decision Making Amount and/or Complexity of Data Reviewed Labs: ordered. Radiology: ordered.  Risk OTC drugs. Prescription drug management. Decision regarding hospitalization.    This patient presents to the ED for concern of chest pain, this involves an extensive number of treatment options, and is a complaint that carries with it a high risk of complications and morbidity.  The differential diagnosis includes acute heart attack, would consider all different forms of ACS but the patient has a very abnormal EKG.  Additionally the patient could have some variation on coronary  abnormality such as a dissection, she has a known history of coronary disease which is quite severe, she has stents and is supposed to be taking Brilinta .   Co morbidities / Chronic conditions that complicate the patient evaluation  Chronic heart disease   Additional history obtained:  Additional history obtained from EMR External records from outside source obtained and reviewed including medical record including echocardiograms and heart catheterizations Had been admitted with sepsis approximately 1-1/2 weeks ago   Lab Tests:  I Ordered, and personally interpreted labs.  The pertinent results include: CBC unremarkable, Trop pending at the time of transfer   Imaging Studies ordered:  I ordered imaging studies including chest x-ray portable I independently visualized and interpreted imaging which showed no acute findings of pneumothorax however the is a left middle lobe infiltrate which seems larger than it was the week prior when she had been admitted for sepsis I agree with the radiologist interpretation   Cardiac Monitoring: / EKG:  The patient was maintained on a cardiac monitor.  I personally viewed and interpreted the cardiac monitored which showed an underlying rhythm of: Sinus rhythm, narrow complex, no arrhythmias seen   Problem List / ED Course / Critical interventions / Medication management  This patient is critically ill with what appears to be an acute coronary syndrome likely a STEMI given the EKG findings.  There is a possibility that this is related to underlying infiltrate on the CXR but with the history of chets pain and the EKG changes - decision was made to cover the more significant abnormalities and activate STEMI - DR. Jordan agrees and pt will be sent by local EMS to expedite transfer to cardiac center at [facility]. I ordered medication including patient is allergic to aspirin  but she was given heparin , nitroglycerin  was avoided due to relative  hypotension, pacer pads were placed just in case she had difficulty and route Reevaluation of the patient after these medicines showed that the patient no significant changes however she did respond to IV fluid bolus and her blood pressure came up to 100 systolic prior to transfer  I have reviewed the patients home medicines and have made adjustments as needed   Consultations Obtained:  I requested consultation with the cardiologist Dr. Peter Jordan,  and discussed lab and imaging findings as well as pertinent plan - they recommend: Transfer to the heart catheterization lab at [facility] immediately, EMS was contacted and they are here to bring her within 5 minutes of the phone call   Social Determinants of Health:  Chronic cardiac disease, chronic kidney failure Lives in a group home situation   Test / Admission - Considered:  Admit to high level of care, critically ill      Final diagnoses:  None    ED Discharge Orders     None          Cleotilde Rogue, MD 05/22/24 [id number]

## 2024-05-22 NOTE — Progress Notes (Signed)
 ED Pharmacy Antibiotic Sign Off An antibiotic consult was received from an ED provider for cefepime  per pharmacy dosing for pneumonia. A chart review was completed to assess appropriateness.  The following one time order(s) were placed per pharmacy consult:  cefepime  2000 mg x 1 dose  Further antibiotic and/or antibiotic pharmacy consults should be ordered by the admitting provider if indicated.   Thank you for allowing pharmacy to be a part of this patient's care.   Dorn Buttner, PharmD, BCPS 05/22/2024 11:16 AM ED Clinical Pharmacist -  [phone]

## 2024-05-22 NOTE — H&P (Addendum)
 History and Physical    Patient: Cassidy Larson FMW:[id number] DOB: 12-Feb-1956 DOA: 05/22/2024 DOS: the patient was seen and examined on 05/22/2024 PCP: [name], FNP  Patient coming from: Transferred from [facility]  Chief Complaint:  Chief Complaint  Patient presents with   Chest Pain   HPI: Cassidy Larson is a 68 y.o. 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  She was  just recently been hospitalized from 11/29-12/4 with concern for sepsis thought possibly secondary to a left-sided bacterial community-acquired pneumonia.  Blood cultures were negative and patient was treated with IV cefepime  and transition to oral Augmentin  to complete a 5-day course at discharge. Records note urine Legionella cultures were positive from admission.  Speech therapy evaluated her as well and patient was started on modified dysphagia 3 diet. Since returning home, she has experienced chest pain located on the left side, described as 'burning' and associated with difficulty breathing. These symptoms began after taking amoxicillin   She denies any leg swelling and previously had not required oxygen.  She has experienced diarrhea since arriving at the hospital, which she attributes to the medication she was given. She has a history of smoking but states she has quit, with her last cigarette being last weekend.   In the emergency department patient was noted to be tachycardic with heart rates elevated up to 126, respirations 14-29, transient hypotensive down to 67/46 with improvement with IV fluids up to 105/63, and O2 saturations currently maintained on 2 L of nasal cannula oxygen.  Labs significant for WBC 10.7, hemoglobin 9.1, BUN 22, creatinine 1.54, and high-sensitivity troponin 61.  Patient had initially been  transferred from [facility] to [facility] as a code STEMI.  Repeat EKG did not show any significant ischemic changes.  CT angiogram of the chest was done and which showed progressive left upper lobe pneumonia, diffuse pulmonary edema and small left pleural effusion consistent with right heart failure  Review of Systems: As mentioned in the history of present illness. All other systems reviewed and are negative. Past Medical History:  Diagnosis Date   Anxiety    Blood transfusion without reported diagnosis    CAD in native artery residual post LAD stent, 100% RCA and 80% LCx.  03/30/2022   CHF (congestive heart failure) (HCC)    CKD (chronic kidney disease) stage 4, GFR 15-29 ml/min (HCC)    Cocaine abuse (HCC)    Essential hypertension    HLD (hyperlipidemia) 03/30/2022   Lupus    On mechanically assisted ventilation (HCC) extubated 03/28/22  03/28/2022   Polysubstance abuse (HCC)    Positive ANA (antinuclear antibody)    S/P angioplasty with stent to mLAD 03/27/22 03/30/2022   Past Surgical History:  Procedure Laterality Date   CORONARY/GRAFT ACUTE MI REVASCULARIZATION N/A 03/27/2022   Procedure: Coronary/Graft Acute MI Revascularization;  Surgeon: Verlin Lonni BIRCH, MD;  Location: [facility] INVASIVE CV LAB;  Service: Cardiovascular;  Laterality: N/A;   LEFT HEART CATH AND CORONARY ANGIOGRAPHY N/A 03/27/2022   Procedure: LEFT HEART CATH AND CORONARY ANGIOGRAPHY;  Surgeon: Verlin Lonni BIRCH, MD;  Location: [facility] INVASIVE CV LAB;  Service: Cardiovascular;  Laterality: N/A;   Social History:  reports that she has been smoking cigarettes. She has never used smokeless tobacco. She reports that she does not currently use drugs after having used the following drugs: Marijuana and  Crack cocaine. She reports that she does not drink alcohol.  Allergies  Allergen Reactions   Asa [Aspirin ] Diarrhea, Nausea And Vomiting and Other (See Comments)    Severe stomach cramps Diarrhea (sometimes  with blood)   Latex Itching   Other Rash    Cotton fabric - rash    Family History  Family history unknown: Yes    Prior to Admission medications   Medication Sig Start Date End Date Taking? Authorizing Provider  adalimumab (HUMIRA) 40 MG/0.8ML AJKT pen Inject 40 mg into the skin every 14 (fourteen) days. 09/01/23   [provider]  amLODipine (NORVASC) 2.5 MG tablet Take 2.5 mg by mouth daily.    [provider]  amoxicillin -clavulanate (AUGMENTIN ) 875-125 MG tablet Take 1 tablet by mouth 2 (two) times daily. 05/20/24   Rashid, Farhan, MD  atorvastatin  (LIPITOR ) 40 MG tablet Take 40 mg by mouth at bedtime. 05/20/23   [provider]  calcium  carbonate (OS-CAL - DOSED IN MG OF ELEMENTAL CALCIUM ) 1250 (500 Ca) MG tablet Take 1 tablet (1,250 mg total) by mouth daily with breakfast. 05/21/24   Rashid, Farhan, MD  carvedilol  (COREG ) 6.25 MG tablet Take 1 tablet by mouth 2 (two) times daily with a meal. 05/20/23   [provider]  dextromethorphan -guaiFENesin  (MUCINEX  DM) 30-600 MG 12hr tablet Take 1 tablet by mouth 2 (two) times daily. 05/20/24   Rashid, Farhan, MD  GOODSENSE PAIN RELIEF EXTRA ST 500 MG tablet Take 500 mg by mouth every 8 (eight) hours as needed for mild pain (pain score 1-3). 04/16/24   [provider]  hydrALAZINE  (APRESOLINE ) 25 MG tablet Take 1 tablet by mouth 3 (three) times daily. 05/20/23   [provider]  hydroxychloroquine (PLAQUENIL) 200 MG tablet Take by mouth. 08/08/23   [provider]  hydrOXYzine (ATARAX) 50 MG tablet Take 50 mg by mouth 3 (three) times daily. 04/20/24   [provider]  isosorbide  mononitrate (IMDUR ) 30 MG 24 hr tablet Take 1 tablet (30 mg total) by mouth daily. 03/21/23   Ula Prentice SAUNDERS, MD  loperamide  (IMODIUM ) 2 MG capsule Take 1 capsule (2 mg total) by mouth as needed for diarrhea or loose stools. 05/20/24   Rashid, Farhan, MD  mirtazapine  (REMERON ) 7.5 MG tablet Take 7.5 mg by  mouth at bedtime. 04/20/24   [provider]  sertraline  (ZOLOFT ) 100 MG tablet Take 100 mg by mouth daily. 04/20/24   [provider]  ticagrelor  (BRILINTA ) 90 MG TABS tablet Take 1 tablet (90 mg total) by mouth 2 (two) times daily. 03/21/23   Ula Prentice SAUNDERS, MD    Physical Exam: Vitals:   05/22/24 9164 05/22/24 0838 05/22/24 0845 05/22/24 0900  BP: 95/68  93/60 105/63  Pulse:      Resp: 15  14 17   Temp:  (!) 97.4 F (36.3 C)    TempSrc:  Rectal    SpO2:      Weight:      Height:        Constitutional: Chronically ill-appearing female Eyes: PERRL, lids and conjunctivae normal ENMT: Mucous membranes are moist. Posterior pharynx clear of any exudate or lesions.Appears edentulous neck: normal, supple, unable to assess JVD as patient has IV access on the right side of the neck Respiratory: Decreased aeration with crackles appreciated both lung fields.  O2 saturation Maintained on 2 L of nasal cannula oxygen Cardiovascular: Regular rate and rhythm, no murmurs / rubs / gallops. No significant lower extremity edema.  2+ pedal  pulses. No carotid bruits.  Abdomen: no tenderness, no masses palpated. . Bowel sounds positive.  Musculoskeletal: no clubbing / cyanosis. No joint deformity upper and lower extremities. Good ROM, no contractures. Normal muscle tone.  Skin: Hyperpigmentation rash noted across nose Neurologic: CN 2-12 grossly intact.  Global weakness but able to move all extremities Psychiatric: Normal judgment and insight. Alert and oriented x 3.  Prior mood.   Data Reviewed:  Most recent EKG reveals sinus rhythm at 66 bpm with prolonged QT interval of 512.  Assessment and Plan:  Acute respiratory failure with hypoxia SIRS/sepsis Community-acquired pneumonia Recently discharged from the hospital after being treated for sepsis secondary to community-acquired pneumonia with cefepime  and transition to Augmentin  at discharge to complete a 5-day course.  It appears  cultures were positive for Legionella on 11/29.  Patient presents back to the hospital with worsening shortness of breath and chest pain.  She was noted to be tachycardic and tachypneic with initial white blood cell count 10.7.  O2 saturations reportedly as low as 81% on room air with improvement on 2 L nasal cannula oxygen. CT angiogram of the chest revealing progressive left upper lobe pneumonia as well as concerns for pulmonary edema. - Admit to a progressive bed - Continuous pulse oximetry with oxygen to maintain O2 saturation greater than 90% - Incentive spirometry and flutter valve - Check blood cultures and lactic acid - Check procalcitonin  - Recheck urine Legionella and strep - Continue empiric antibiotics of cefepime  and added doxycycline  given prolonged QT for treatment of Legionella as previously had been positive - Mucinex   Systolic congestive heart failure exacerbation Suspected acute on chronic.  CTA of the chest noted concern for pulmonary edema with signs of right heart failure.  Last echocardiogram noted EF to be around 50-55% with apical hypokinesis back in 2023. - Strict I&O's and daily weight - Follow-up BNP(801.9.) - Check echocardiogram - Question diuresis - Consult cardiology for evaluation, will follow-up for any further recommendation  Chest pain Elevated troponin Patient presented with complaints of chest pain.  High-sensitivity troponin mildly elevated at 61.    Initial EKG concerning for ST depressions in the anterior leads concerning for possible posterior infarct, but repeat check noted EKG without any significant ST wave changes.    Cardiology did not feel symptoms represented acute STEMI and code STEMI was canceled. CT angiogram of the chest did not note any signs of a dissection or pulmonary embolism and progression of left-sided pneumonia which may be the cause of her symptoms.  Patient had been given full dose aspirin  and had initially been bolused heparin .   Possibly secondary to demand. - Checking echocardiogram  Transient hypotension Blood pressures noted to be low as 67/46 with some improvement after initial IV fluid fluids. - Goal MAP greater than 65 - Holding home blood pressure regimen at this time - Holding additional fluid due to concern for patient being acutely fluid overload  Normocytic anemia Chronic hemoglobin noted to be 9.1 which appears similar to when he was recently discharged from the hospital. - Continue to monitor  Chronic kidney disease stage IIIb Creatinine noted to be 1.54 with BUN 22. - Continue to monitor kidney function  CAD s/p PCI Remote history of stenting of the mid LAD - Continue Brilinta , Lipitor   Prolonged QT interval Chronic.  QTc 512 - Avoid any QT prolonging medications  Systemic lupus Review of records note patient with a history of positive ANA back in 2019.  Previously had been on  Plaquenil  Mixed hyperlipidemia - Continue Lipitor   Dysthymic disorder - Continue Zoloft  but held mirtazapine   Elevated liver enzymes Acute.  AST 47 and ALT 52.  Possibly secondary to passive congestion from patient being fluid overload. - Continue to monitor  Tobacco abuse Patient just recently stopped smoking in the last week. - Continue to encourage cessation of tobacco use  DVT prophylaxis: Advance Care Planning:   Code Status: Do not attempt resuscitation (DNR) PRE-ARREST INTERVENTIONS DESIRED    Consults: Cardiology  Family Communication: legal guardian  Severity of Illness: The appropriate patient status for this patient is INPATIENT. Inpatient status is judged to be reasonable and necessary in order to provide the required intensity of service to ensure the patient's safety. The patient's presenting symptoms, physical exam findings, and initial radiographic and laboratory data in the context of their chronic comorbidities is felt to place them at high risk for further clinical deterioration.  Furthermore, it is not anticipated that the patient will be medically stable for discharge from the hospital within 2 midnights of admission.   * I certify that at the point of admission it is my clinical judgment that the patient will require inpatient hospital care spanning beyond 2 midnights from the point of admission due to high intensity of service, high risk for further deterioration and high frequency of surveillance required.*  Author: Maximino DELENA Sharps, MD 05/22/2024 11:08 AM  For on call review [email or web address].

## 2024-05-22 NOTE — Consult Note (Addendum)
 Cardiology Consultation   Patient ID: Niko Jakel MRN: [id number]; DOB: 12-12-1955  Admit date: 05/22/2024 Date of Consult: 05/22/2024  PCP:  [name], FNP   [facility] Providers Cardiologist:  Lonni Cash, MD      Patient Profile: Rosaland Shiffman is a 68 y.o. female with a hx of CAD, RA, hypertension, hyperlipidemia, CKD, SLE, history of polysubstance use who is being seen 05/22/2024 for the evaluation of CHF, chest pain at the request of Dr. Claudene.  History of Present Illness: Ms. Gillentine is a 68 year old female with above medical history.  Patient previously admitted with a STEMI in 03/2022.  STEMI occurred about 4 hours after she had taken cocaine.  Underwent cardiac catheterization with successful PTCA/DES x 1 to the mid LAD.  There was moderate-severe mid circumflex stenosis.  RCA was dominant with chronic occlusion of the mid RCA.  There was good collateral flow.  There was some consideration of PCI of the circumflex.  However, lesion was not critical.  Recommended observing patient's compliance with medical therapy and cocaine abstinence before additional stents were placed.  Echocardiogram at that time showed EF 55-60%, normal RV systolic function, no significant valvular abnormalities.  Most recent echocardiogram from 04/22/2022 showed EF 50-55% with regional wall motion abnormalities, normal RV systolic function, no significant valvular abnormalities.  Patient has failed to follow-up with cardiology as an outpatient.  Patient was recently admitted from 11/29 - 05/20/2024 with sepsis thought to be due to community-acquired pneumonia.  Blood cultures were negative, patient treated with IV antibiotics and transition to oral Augmentin  prior to discharge.  Patient presents to the ED at [facility] on 12/6 complaining of chest pain.  Patient reported that pain started in the morning when she woke up.  Initially in the middle of her chest but  radiated to her left flank.  BP in the 60s/40s by paramedics.  Given IV fluids. There was concern for possible STEMI, so patient was transferred to [facility] for evaluation.  Dr. Jordan saw patient in the ED.  When Dr. Jordan evaluated patient, she reported minimal chest pain.  Complained of flank pain.  Initial EKG had shown some ST depression in anterior leads concerning for possible posterior infarct.  Repeat EKG showed was normal EKG with no ST changes.  Troponin was 61.  Code STEMI was canceled.  Suspect she was hypotensive due to sepsis and EKG changes were likely due to hypotension.  Workup in the ED significant for creatinine 1.54, WBC 10.7, hemoglobin 9.1.  High-sensitivity troponin 61.  BNP 801.  CTA chest/ab/pelvis showed progressive left upper lobe pneumonia, diffuse pulmonary edema and small left pleural effusion, reflux of contrast material into IVC and hepatic veins consistent with RV failure.  On interview, patient tells me that she has been feeling poorly for quite some time now.  Tells me that she has been having chest pain since her heart problems started.  We reviewed that she had a stent put in in 03/2022.  Patient tells me she has had constant chest pain since then.  Unable to identify any aggravating or alleviating factors.  She also feels very sore on the left side of her chest.  It hurts whenever she moves.  She has had a cough since being diagnosed with pneumonia.  Cough is dry and nonproductive.  Denies orthopnea.  Denies lower extremity swelling.  Tells me that since being diagnosed with pneumonia, she has continued to have a cough.  She has had chills/bodyaches as well.  Has also had diarrhea and a decreased appetite.  Has been trying to drink fluids but has not been eating very much.  Feels dizzy whenever she tries to sit or stand up.  Okay when laying flat.   Past Medical History:  Diagnosis Date   Anxiety    Blood transfusion without reported diagnosis    CAD in native artery  residual post LAD stent, 100% RCA and 80% LCx.  03/30/2022   CHF (congestive heart failure) (HCC)    CKD (chronic kidney disease) stage 4, GFR 15-29 ml/min (HCC)    Cocaine abuse (HCC)    Essential hypertension    HLD (hyperlipidemia) 03/30/2022   Lupus    On mechanically assisted ventilation (HCC) extubated 03/28/22  03/28/2022   Polysubstance abuse (HCC)    Positive ANA (antinuclear antibody)    S/P angioplasty with stent to mLAD 03/27/22 03/30/2022    Past Surgical History:  Procedure Laterality Date   CORONARY/GRAFT ACUTE MI REVASCULARIZATION N/A 03/27/2022   Procedure: Coronary/Graft Acute MI Revascularization;  Surgeon: Verlin Lonni BIRCH, MD;  Location: [facility] INVASIVE CV LAB;  Service: Cardiovascular;  Laterality: N/A;   LEFT HEART CATH AND CORONARY ANGIOGRAPHY N/A 03/27/2022   Procedure: LEFT HEART CATH AND CORONARY ANGIOGRAPHY;  Surgeon: Verlin Lonni BIRCH, MD;  Location: [facility] INVASIVE CV LAB;  Service: Cardiovascular;  Laterality: N/A;     Scheduled Meds:  aspirin   324 mg Oral Once   atorvastatin   40 mg Oral Daily   enoxaparin  (LOVENOX ) injection  30 mg Subcutaneous Q24H   furosemide   20 mg Intravenous BID   guaiFENesin   600 mg Oral BID   sertraline   100 mg Oral Daily   sodium chloride  flush  3 mL Intravenous Q12H   ticagrelor   90 mg Oral BID   Continuous Infusions:  ceFEPime  (MAXIPIME ) IV 2 g (05/22/24 1255)   [START ON 05/23/2024] ceFEPime  (MAXIPIME ) IV     doxycycline  (VIBRAMYCIN ) IV     PRN Meds: acetaminophen  **OR** acetaminophen , albuterol , fentaNYL  (SUBLIMAZE ) injection, trimethobenzamide   Allergies:    Allergies  Allergen Reactions   Asa [Aspirin ] Diarrhea, Nausea And Vomiting and Other (See Comments)    Severe stomach cramps Diarrhea (sometimes with blood)   Latex Itching   Other Rash    Cotton fabric - rash    Social History:   Social History   Socioeconomic History   Marital status: Single    Spouse name: Not on file   Number of  children: Not on file   Years of education: Not on file   Highest education level: Not on file  Occupational History   Not on file  Tobacco Use   Smoking status: Every Day    Current packs/day: 0.50    Types: Cigarettes   Smokeless tobacco: Never  Substance and Sexual Activity   Alcohol use: No   Drug use: Not Currently    Types: Marijuana, Crack cocaine    Comment: 1 month ago   Sexual activity: Yes    Birth control/protection: None  Other Topics Concern   Not on file  Social History Narrative   Not on file   Social Drivers of Health   Financial Resource Strain: Not on File (11/14/2022)   Received from [employer]    Financial Resource Strain: 0  Food Insecurity: No Food Insecurity (05/15/2024)   Hunger Vital Sign    Worried About Running Out of Food in the Last Year: Never true    Ran Out of Food  in the Last Year: Never true  Transportation Needs: Unmet Transportation Needs (05/15/2024)   PRAPARE - Administrator, Civil Service (Medical): Yes    Lack of Transportation (Non-Medical): Yes  Physical Activity: Not on File (11/14/2022)   Received from [facility]   Physical Activity    Physical Activity: 0  Stress: Not on File (11/14/2022)   Received from [facility]   Stress    Stress: 0  Social Connections: Socially Isolated (05/15/2024)   Social Connection and Isolation Panel    Frequency of Communication with Friends and Family: Never    Frequency of Social Gatherings with Friends and Family: Never    Attends Religious Services: 1 to 4 times per year    Active Member of [employer] or Organizations: No    Attends Banker Meetings: Never    Marital Status: Widowed  Intimate Partner Violence: Not At Risk (05/15/2024)   Humiliation, Afraid, Rape, and Kick questionnaire    Fear of Current or Ex-Partner: No    Emotionally Abused: No    Physically Abused: No    Sexually Abused: No    Family History:    Family History  Family history  unknown: Yes     ROS:  Please see the history of present illness.  All other ROS reviewed and negative.     Physical Exam/Data: Vitals:   05/22/24 1145 05/22/24 1148 05/22/24 1200 05/22/24 1233  BP: 95/67  93/64 93/61  Pulse:  80  86  Resp:  20 15 20   Temp:    (!) 97 F (36.1 C)  TempSrc:    Axillary  SpO2:  97%  90%  Weight:      Height:       No intake or output data in the 24 hours ending 05/22/24 1320    05/22/2024    7:25 AM 05/15/2024   10:24 PM 05/15/2024    7:57 PM  Last 3 Weights  Weight (lbs) 100 lb 5 oz 99 lb 10.4 oz 110 lb  Weight (kg) 45.5 kg 45.2 kg 49.896 kg     Body mass index is 19.59 kg/m.  General:  Thin, chronically ill appearing. No acute distress. Sitting upright in the bed  HEENT: normal Neck: no JVD Vascular: Radial pulses 2+ bilaterally Cardiac:  normal S1, S2; RRR; no murmur. Left chest wall tender to palpation  Lungs:  anterior lung exam clear to auscultation. Patient refused to roll in bed or sit up due to pain  Abd: soft, nontender  Ext: no edema in BLE Musculoskeletal:  No deformities Skin: warm and dry  Neuro:  CNs 2-12 intact, no focal abnormalities noted Psych:  Normal affect   Telemetry:  Telemetry was personally reviewed and demonstrates:  NSR  Relevant CV Studies: Cardiac Studies & Procedures   ______________________________________________________________________________________________ CARDIAC CATHETERIZATION  CARDIAC CATHETERIZATION 03/27/2022  Conclusion   Prox RCA to Mid RCA lesion is 100% stenosed.   Mid Cx lesion is 80% stenosed.   Mid LAD lesion is 100% stenosed.   A drug-eluting stent was successfully placed using a SYNERGY XD 2.50X16.   Post intervention, there is a 0% residual stenosis.  Acute anterior STEMI secondary to thrombotic occlusion of the mid LAD Successful PTCA/DES x 1 mid LAD Moderately severe mid Circumflex stenosis Dominant RCA with chronic occlusion of the mid RCA. The distal RCA branches fill  from left to right collaterals. LVEDP=17 mmHg Pt intubated during case secondary to agitation.  Recommendations: Will admit to the ICU. Will  ask PCCM team to assist in ventilator management. Will continue Aggrastat  infusion for 4 hours. Will give Brilinta  180 mg po x 1 once OG tube in place. Will then continue ASA/Brilinta  for one year. It does not appear that she has a true ASA allergy. Will start high intensity statin. Echo in am. No beta blocker given cocaine abuse. Consider PCI of the Circumflex but this lesion is not critical and it may be best to observe her compliance with medical therapy and cocaine abstinence before placing additional coronary stents.  Findings Coronary Findings Diagnostic  Dominance: Right  Left Anterior Descending Vessel is large. Mid LAD lesion is 100% stenosed. The lesion is thrombotic.  Left Circumflex Vessel is large. Mid Cx lesion is 80% stenosed.  Right Coronary Artery Vessel is large. Prox RCA to Mid RCA lesion is 100% stenosed. The lesion is chronically occluded.  Third Right Posterolateral Branch Collaterals 3rd RPL filled by collaterals from 2nd Sept.  Intervention  Mid LAD lesion Stent CATH VISTA GUIDE 6FR XBLAD3.5 guide catheter was inserted. Lesion crossed with guidewire using a WIRE COUGAR XT STRL 190CM. Pre-stent angioplasty was performed using a BALLN TREK OTW 2.5X12. A drug-eluting stent was successfully placed using a SYNERGY XD 2.50X16. Stent strut is well apposed. Post-stent angioplasty was performed using a BALL SAPPHIRE NC24 [id number]. Post-Intervention Lesion Assessment The intervention was successful. Pre-interventional TIMI flow is 0. Post-intervention TIMI flow is 3. No complications occurred at this lesion. There is a 0% residual stenosis post intervention.     ECHOCARDIOGRAM  ECHOCARDIOGRAM LIMITED 04/22/2022  Narrative ECHOCARDIOGRAM LIMITED REPORT    Patient Name:   Bela Bonaparte Date of Exam: 04/22/2022 Medical  Rec #:  [id number]        Height:       62.0 in Accession #:    [id number]       Weight:       86.0 lb Date of Birth:  September 25, 1955        BSA:          1.335 m Patient Age:    66 years         BP:           132/65 mmHg Patient Gender: F                HR:           54 bpm. Exam Location:  Inpatient  Procedure: Limited Echo and Color Doppler  Indications:    Chest pain  History:        Patient has prior history of Echocardiogram examinations. CAD and Previous Myocardial Infarction; Risk Factors:Hypertension and Dyslipidemia. Polysubstance use, cocaine use, Lupus.  Sonographer:    Clotilda Center Referring Phys: PERRI RADDLE, A, CALDWELL   Sonographer Comments: Image acquisition challenging due to respiratory motion and Image acquisition challenging due to patient body habitus. IMPRESSIONS   1. Left ventricular ejection fraction, by estimation, is 50 to 55%. The left ventricle has low normal function. The left ventricle demonstrates regional wall motion abnormalities. Apical hypokinesis. 2. Right ventricular systolic function is normal. The right ventricular size is normal. 3. The mitral valve is normal in structure. Trivial mitral valve regurgitation. 4. The aortic valve was not well visualized. Aortic valve regurgitation is not visualized.  FINDINGS Left Ventricle: Left ventricular ejection fraction, by estimation, is 50 to 55%. The left ventricle has low normal function. The left ventricle demonstrates regional wall motion abnormalities. The left ventricular internal cavity size was normal in size.  There is no left ventricular hypertrophy.  Right Ventricle: The right ventricular size is normal. No increase in right ventricular wall thickness. Right ventricular systolic function is normal.  Pericardium: There is no evidence of pericardial effusion.  Mitral Valve: The mitral valve is normal in structure. Trivial mitral valve regurgitation.  Tricuspid Valve: The tricuspid valve is  normal in structure. Tricuspid valve regurgitation is trivial.  Aortic Valve: The aortic valve was not well visualized. Aortic valve regurgitation is not visualized.  Additional Comments: Color Doppler performed.  LEFT VENTRICLE PLAX 2D LVIDd:         4.80 cm LVIDs:         3.10 cm LV PW:         0.70 cm LV IVS:        0.60 cm   LEFT ATRIUM         Index LA diam:    3.80 cm 2.85 cm/m  Lonni Nanas MD Electronically signed by Lonni Nanas MD Signature Date/Time: 04/22/2022/11:20:10 AM    Final          ______________________________________________________________________________________________        Laboratory Data: High Sensitivity Troponin:  No results for input(s): TROPONINIHS in the last 720 hours.   Chemistry Recent Labs  Lab 05/16/24 [id number] 05/17/24 0453 05/18/24 0458 05/19/24 0429 05/22/24 0732  NA 139   < > 139 141 140  K 3.5   < > 3.4* 3.5 3.7  CL 105   < > 107 110 110  CO2 20*   < > 20* 21* 17*  GLUCOSE 88   < > 66* 116* 168*  BUN 18   < > 15 14 22   CREATININE 1.59*   < > 1.35* 1.38* 1.54*  CALCIUM  8.4*   < > 7.9* 8.1* 8.3*  MG 1.9  --   --   --   --   GFRNONAA 35*   < > 43* 42* 37*  ANIONGAP 14   < > 12 10 13    < > = values in this interval not displayed.    Recent Labs  Lab 05/15/24 2024 05/16/24 0607 05/22/24 0732  PROT 8.1 7.2 6.9  ALBUMIN 4.0 3.5 3.2*  AST 26 35 47*  ALT 22 28 52*  ALKPHOS 80 84 105  BILITOT 0.5 0.4 0.3   Lipids  Recent Labs  Lab 05/22/24 0732  CHOL 95  TRIG 173*  HDL 18*  LDLCALC 43  CHOLHDL 5.3    Hematology Recent Labs  Lab 05/18/24 0458 05/19/24 0429 05/22/24 0731  WBC 10.1 8.5 10.7*  RBC 2.94* 3.08* 3.05*  HGB 9.1* 9.3* 9.1*  HCT 27.6* 28.7* 28.6*  MCV 93.9 93.2 93.8  MCH 31.0 30.2 29.8  MCHC 33.0 32.4 31.8  RDW 13.5 13.4 13.8  PLT 149* 158 284   Thyroid No results for input(s): TSH, FREET4 in the last 168 hours.  BNP Recent Labs  Lab 05/22/24 1104  BNP  801.9*    DDimer No results for input(s): DDIMER in the last 168 hours.  Radiology/Studies:  CT Angio Chest/Abd/Pel for Dissection W and/or W/WO Result Date: 05/22/2024 EXAM: CTA CHEST, ABDOMEN AND PELVIS WITHOUT AND WITH CONTRAST 05/22/2024 09:35:07 AM TECHNIQUE: CTA of the chest was performed without and with the administration of intravenous contrast. CTA of the abdomen and pelvis was performed without and with the administration of intravenous contrast. Multiplanar reformatted images are provided for review. MIP images are provided for review. Automated exposure control, iterative reconstruction, and/or weight  based adjustment of the mA/kV was utilized to reduce the radiation dose to as low as reasonably achievable. COMPARISON: CT chest 05/15/2024 and CT abdomen and pelvis 05/17/2024. CLINICAL HISTORY: Acute aortic syndrome (AAS) suspected. FINDINGS: VASCULATURE: AORTA: Aortic atherosclerosis. No acute finding. No abdominal aortic aneurysm. No dissection. PULMONARY ARTERIES: The central pulmonary arteries are patent. No pulmonary embolism with the limits of this exam. GREAT VESSELS OF AORTIC ARCH: No acute finding. No dissection. No arterial occlusion or significant stenosis. CELIAC TRUNK: No acute finding. No occlusion or significant stenosis. SUPERIOR MESENTERIC ARTERY: Atherosclerotic calcifications involving the superior mesenteric artery and its branches noted without significant stenosis. INFERIOR MESENTERIC ARTERY: No acute finding. No occlusion or significant stenosis. RENAL ARTERIES: The left renal artery is patent. Approximately 30 percent stenosis of the right renal artery due to calcified plaque at the origin. ILIAC ARTERIES: Bilateral iliacs artery calcifications without significant stenosis. CHEST: MEDIASTINUM: Heart size is normal. No pericardial effusion. Coronary artery atherosclerotic calcifications. Enlarged thyroid gland is again noted with retrosternal extension. Similar to previous  study. Increased soft tissue thickening extending along the left prevascular space is noted and may reflect thymic hyperplasia this appears unchanged from 05/15/2024 but has progressed when compared with more remote study from 06/20/2023. LUNGS AND PLEURA: The central airways are patent. Small left pleural effusion, increased from previous exam. Emphysema. Diffuse interlobular septal thickening and ground-glass attenuation is noted throughout both lungs concerning for interstitial edema. Within the left upper lobe there is progressive ground-glass attenuation interstitial thickening and consolidative change concerning for pneumonia. Interval reexpansion of previous left lower lobe atelectasis. No pneumothorax. THORACIC BONES AND SOFT TISSUES: No acute bone or soft tissue abnormality. ABDOMEN AND PELVIS: LIVER: Reflux of contrast material into the IVC and hepatic veins suggests right heart dysfunction. GALLBLADDER AND BILE DUCTS: Gallbladder is unremarkable. No biliary ductal dilatation. SPLEEN: The spleen is within normal limits in size and appearance. PANCREAS: The pancreas is normal in size and contour without focal lesion or ductal dilatation. ADRENAL GLANDS: Unchanged appearance of left adrenal nodule measuring 2.3 cm, image 140/7. This is stable when compared with 07/03/2022 compatible with a likely benign process. KIDNEYS, URETERS AND BLADDER: No stones in the kidneys or ureters. No hydronephrosis. No perinephric or periureteral stranding. Urinary bladder is unremarkable. GI AND BOWEL: Stomach and duodenal sweep demonstrate no acute abnormality. No pathologic dilatation of the large or small bowel loops. No bowel inflammation. Colonic diverticula noted without signs of acute diverticulitis. There is no bowel obstruction. No abnormal bowel wall thickening or distension. REPRODUCTIVE: The uterus and adnexal structures are unremarkable. PERITONEUM AND RETROPERITONEUM: No significant free fluid or fluid  collections are noted. No signs of pneumoperitoneum. LYMPH NODES: Enlarging subcarinal carina lymph node now measures 1.5 cm versus 1.0 cm previously. Enlarging right paratracheal lymph node measures 1.6 cm versus 1.0 cm previously. Prominent bilateral inguinal lymph nodes are identified including 1 cm left inguinal node, image 262/2. The left external iliac lymph node has increased in size measuring 1.2 cm, image 33.86. previously 0.9 cm. ABDOMINAL BONES AND SOFT TISSUES: Degenerative changes are noted within both hips. Small fat-containing umbilical hernia. No acute soft tissue abnormality. IMPRESSION: 1. No evidence of acute aortic syndrome. 2. Progressive left upper lobe pneumonia. 3. Diffuse pulmonary edema and small left pleural effusion. Reflux of contrast material into the IVC and hepatic veins consistent with right heart failure. 4. No central pulmonary arterial occlusion. 5. Enlarging mediastinal lymph nodes are nonspecific in the setting of chf and pneumonia. 6. Progressive increased  soft tissue within the left prevascular region may reflect thymic hyperplasia acute setting. Consider follow-up imaging with repeat CT of the chest in 1 to 3 months to ensure stability and/or resolution. 7. Aortic atherosclerotic calcifications and coronary artery calcifications. 8. Progressive increased size of bilateral inguinal and left external iliac lymph nodes compared with 05/17/2024. Nonspecific but favor reactive changes. Electronically signed by: Waddell Calk MD 05/22/2024 10:27 AM EST RP Workstation: [id number]   DG Chest Port 1 View Result Date: 05/22/2024 EXAM: 1 VIEW(S) XRAY OF THE CHEST 05/22/2024 07:39:00 AM COMPARISON: 05/15/2024 CLINICAL HISTORY: chest pain FINDINGS: LUNGS AND PLEURA: Interval increase in airspace opacity within left mid lung zone. No pleural effusion. No pneumothorax. HEART AND MEDIASTINUM: Atherosclerotic plaque noted. No acute abnormality of the cardiac and mediastinal silhouettes.  BONES AND SOFT TISSUES: No acute osseous abnormality. IMPRESSION: 1. Interval increase in airspace opacity within the left mid lung zone. Electronically signed by: Evalene Coho MD 05/22/2024 07:58 AM EST RP Workstation: [id number]     Assessment and Plan:  CAD  Chest pain  - Patient previously had STEMI in 03/2022 in the setting of cocaine use.  Received DES to mid LAD.  There was CTO of the RCA with left-to-right collaterals.  Moderate severe mid circumflex stenosis.  Considered PCI on the mid circumflex, however lesion was not critical.  Recommended observing her compliance with medical therapy and cocaine abstinence before placing additional stents.  Patient has not followed up with cardiology since that time. - Now, patient presents with left-sided chest pain.  Chest pain somewhat vague.  Tells me she has had constant pain since 03/2022.  Left chest wall is tender to palpation. - Initial EKG showed ST depression in anterior leads, concerning for possible infarct.  Repeat EKG showed no ST changes.  Patient was seen by Dr. Jordan, felt that EKG changes were due to hypotension in setting of known disease.  Was not considered to be a STEMI - Initial troponin 61.  Ordered repeat. Very unlikely that this represents ACS  - Suspect that pain is related to pneumonia, possible musculoskeletal component given tenderness to palpation - Echocardiogram pending - Continue lipitor  40 mg daily, brilinta  90 mg BID  Elevated  BNP  - Most recent echocardiogram from 04/2022 showed EF 50-55%, normal RV systolic function.  CTA chest/abd/pelv this admission showed pulmonary edema, small left pleural effusion, reflux of contrast material in the IVC consistent with right heart failure.  Also showed progressive left upper lobe pneumonia - BNP elevated to 800 - Patient appears euvolemic on exam.  Weight to 100 pounds, was previously 99 pounds on 11/29.  She has had poor oral intake for the past several days.  Has  also had diarrhea for multiple days.  Her blood pressure has been running low.  Currently in the 80s systolic. - Hold diuretics - Echo pending  Otherwise per primary  - Pneumonia, SIRS/Sepsis  - Hypotension  - Normocytic anemia  - CKD stage IIIb  - SLE   - Dysthymic disorder   Risk Assessment/Risk Scores:  For questions or updates, please contact [facility] Please consult [email or web address] for contact info under     Signed, [name] FABIENE Louder, PA-C  05/22/2024 1:20 PM  I have personally evaluated and examined the patient. The history, physical exam, and medical decision making documented below were performed independently and substantively by me. I have reviewed all relevant data, formulated the assessment and plan, and assumed responsibility for the management of this patient. My documentation  reflects the substantive portion of the split/shared visit, in accordance with CMS and CPT guidelines.  I have personally performed the substantive portion of the medical decision making, including interpretation of diagnostic data, formulation of the management plan, and assessment of risks. In summation:  Ms Lenora is a 67 year old with coronary artery disease who presents with flank pain.  Patient presented with hypotension at [facility] after being discharged from [facility] 05/20/24.  Notes that she did not feel well at DC and was not able to keep much of anything donw  She has been experiencing flank pain following a recent discharge from the hospital where she was treated for pneumonia and sepsis. During her hospitalization, she experienced EKG depressions and hypotension with a blood pressure of 60/40 mmHg, which improved with hydration.  EKG changes that occurred during the time of evaluation were resolved.  Dr. Jordan noted   She has a history of a remote myocardial infarction and known coronary artery disease. There is a history of concerns about pneumonia in the past, and she is suspected  to have worsening sepsis, contributing to hypoxic respiratory failure.  She has a history of chronic cocaine use, although she denies current use of marijuana or crack cocaine. She continues to smoke actively.  The history she gives me is notably different from ED, Dr. Claudene, or with [facility] as above.   She notes that there God has a plan for her.  She has no living family (anyone that she considered family).  She notes her life is hard and that she is in pain (will not clarify the pain's location).  She notes that her life is in God's hands but she is tired of the constant hospitalizations.  Notes no appetite for anything and has not been eating for this reason.  Exam notable for  Gen: Chronically ill appearing   Neck: No JVD Cardiac: No Rubs or Gallops, no Murmur Respiratory: Decreased breath sounds bilaterally,  effort,   respiratory rate GI: Soft, nontender, non-distended  Integument: Skin feels warm  In assessment and plan:   Chest Pain Syndrome - Prior myocardial infarction with coronary artery disease. Recent EKG showed depressions with hypotension, improved with hydration. neumonia with sepsis and acute hypoxic respiratory failure Worsening left upper lobe pneumonia with sepsis and acute hypoxic respiratory failure. Concerns about progression to severe sepsis due to hypoxic respiratory failure. Poor long-term prognosis due to comorbidities and need for IV fluids to stabilize blood pressure. Not a candidate for advanced therapies. - Cocaine is possible etiology- recommend cessation and lab testing is pending - NSTEMI is less likely does have known obstructive CAD, see IC note from earlier today - Consider palliative care involvement and support; at my evaluation, her goals of care were most consistent with comfort- this is different with all other documented conversations today - Pending echocardiogram  Elevated BNP and SOB - has had diarrhea and very low PO intake, improved clinically  with IVF would not diurese at this time  Tobacco abuse - discussed cessation.   Stanly Leavens, MD FASE [facility] Cardiologist [facility]  [address] [place], [address] [place], [place] [address] [phone]  3:34 PM

## 2024-05-22 NOTE — Progress Notes (Signed)
 Pharmacy Antibiotic Note  Cassidy Larson is a 68 y.o. female for which pharmacy has been consulted for cefepime  dosing for pneumonia.  Plan: Doxycycline  per MD Cefepime  2g q24hr  Monitor WBC, fever, renal function, cultures De-escalate when able  Height: 5' (152.4 cm) Weight: 45.5 kg (100 lb 5 oz) IBW/kg (Calculated) : 45.5  Temp (24hrs), Avg:97.4 F (36.3 C), Min:97.4 F (36.3 C), Max:97.4 F (36.3 C)  Recent Labs  Lab 05/15/24 2024 05/16/24 0607 05/17/24 0453 05/18/24 0458 05/19/24 0429 05/22/24 0731 05/22/24 0732  WBC 13.1* 15.2* 10.7* 10.1 8.5 10.7*  --   CREATININE 1.79* 1.59* 1.38* 1.35* 1.38*  --  1.54*  LATICACIDVEN 0.9  --   --   --   --   --   --     Estimated Creatinine Clearance: 25.8 mL/min (A) (by C-G formula based on SCr of 1.54 mg/dL (H)).    Allergies  Allergen Reactions   Asa [Aspirin ] Diarrhea, Nausea And Vomiting and Other (See Comments)    Severe stomach cramps Diarrhea (sometimes with blood)   Latex Itching   Other Rash    Cotton fabric - rash    Microbiology results: Pending  Thank you for allowing pharmacy to be a part of this patient's care.  Dorn Buttner, PharmD, BCPS 05/22/2024 11:58 AM ED Clinical Pharmacist -  [phone]

## 2024-05-22 NOTE — Plan of Care (Signed)

## 2024-05-22 NOTE — Progress Notes (Signed)
 Interventional Cardiology  Patient transferred from [facility] ED as Code STEMI.  Presented to ED with complaints of flank pain. Patient tells me she has minimal chest pain. Recently DC on 12/4 with PNA/sepsis.   Remote anterior MI with stenting of mid LAD. Mod/severe disease in LCx and CTO of mid RCA treated medically. No cardiology follow up since.  On arrival to ED patient hypotensive 60/40. Given IV fluids with improvement to 90-100 systolic. Ecg showed ST depression in anterior leads ? Posterior infarct  On arrival here she looks comfortable. Repeat Ecg shows normal Ecg with no ST changes.  Troponin 61  I don't think this represents acute STEMI. Suspect she is hypotensive due to sepsis and Ecg changes are probably due to hypotension in setting of known disease. Will cancel Code STEMI and have patient evaluated by medical team  Please consult cardiology service if needed.  Cassidy Lees MD, FACC

## 2024-05-22 NOTE — Progress Notes (Signed)
 PHARMACY - ANTICOAGULATION CONSULT NOTE  Pharmacy Consult for heparin  Indication: chest pain/ACS  Allergies  Allergen Reactions   Asa [Aspirin ] Diarrhea, Nausea And Vomiting and Other (See Comments)    Severe stomach cramps Diarrhea (sometimes with blood)   Latex Itching   Other Rash    Cotton fabric - rash    Patient Measurements: Height: 5' (152.4 cm) Weight: 45.5 kg (100 lb 5 oz) IBW/kg (Calculated) : 45.5 HEPARIN  DW (KG): 45.5  Vital Signs: Temp: 97.5 F (36.4 C) (12/06 1600) Temp Source: Axillary (12/06 1600) BP: 98/61 (12/06 1600) Pulse Rate: 83 (12/06 1600)  Labs: Recent Labs    05/22/24 0731 05/22/24 0732 05/22/24 1304  HGB 9.1*  --  8.8*  HCT 28.6*  --  26.6*  PLT 284  --  265  APTT  --  39*  --   LABPROT  --  17.0*  --   INR  --  1.3*  --   CREATININE  --  1.54*  --   TROPONINIHS  --   --  1,135*    Estimated Creatinine Clearance: 25.8 mL/min (A) (by C-G formula based on SCr of 1.54 mg/dL (H)).   Medical History: Past Medical History:  Diagnosis Date   Anxiety    Blood transfusion without reported diagnosis    CAD in native artery residual post LAD stent, 100% RCA and 80% LCx.  03/30/2022   CHF (congestive heart failure) (HCC)    CKD (chronic kidney disease) stage 4, GFR 15-29 ml/min (HCC)    Cocaine abuse (HCC)    Essential hypertension    HLD (hyperlipidemia) 03/30/2022   Lupus    On mechanically assisted ventilation (HCC) extubated 03/28/22  03/28/2022   Polysubstance abuse (HCC)    Positive ANA (antinuclear antibody)    S/P angioplasty with stent to mLAD 03/27/22 03/30/2022    Medications:  Medications Prior to Admission  Medication Sig Dispense Refill Last Dose/Taking   adalimumab (HUMIRA) 40 MG/0.8ML AJKT pen Inject 40 mg into the skin every 14 (fourteen) days.      amLODipine (NORVASC) 2.5 MG tablet Take 2.5 mg by mouth daily.      amoxicillin -clavulanate (AUGMENTIN ) 875-125 MG tablet Take 1 tablet by mouth 2 (two) times daily. 10  tablet 0    atorvastatin  (LIPITOR ) 40 MG tablet Take 40 mg by mouth at bedtime.      calcium  carbonate (OS-CAL - DOSED IN MG OF ELEMENTAL CALCIUM ) 1250 (500 Ca) MG tablet Take 1 tablet (1,250 mg total) by mouth daily with breakfast. 30 tablet 0    carvedilol  (COREG ) 6.25 MG tablet Take 1 tablet by mouth 2 (two) times daily with a meal.      dextromethorphan -guaiFENesin  (MUCINEX  DM) 30-600 MG 12hr tablet Take 1 tablet by mouth 2 (two) times daily. 10 tablet 0    GOODSENSE PAIN RELIEF EXTRA ST 500 MG tablet Take 500 mg by mouth every 8 (eight) hours as needed for mild pain (pain score 1-3).      hydrALAZINE  (APRESOLINE ) 25 MG tablet Take 1 tablet by mouth 3 (three) times daily.      hydroxychloroquine (PLAQUENIL) 200 MG tablet Take by mouth.      hydrOXYzine (ATARAX) 50 MG tablet Take 50 mg by mouth 3 (three) times daily.      isosorbide  mononitrate (IMDUR ) 30 MG 24 hr tablet Take 1 tablet (30 mg total) by mouth daily. 30 tablet 1    loperamide  (IMODIUM ) 2 MG capsule Take 1 capsule (2 mg total) by  mouth as needed for diarrhea or loose stools. 30 capsule 0    mirtazapine  (REMERON ) 7.5 MG tablet Take 7.5 mg by mouth at bedtime.      sertraline  (ZOLOFT ) 100 MG tablet Take 100 mg by mouth daily.      ticagrelor  (BRILINTA ) 90 MG TABS tablet Take 1 tablet (90 mg total) by mouth 2 (two) times daily. 30 tablet 1    Scheduled:   aspirin   324 mg Oral Once   atorvastatin   40 mg Oral Daily   guaiFENesin   600 mg Oral BID   sertraline   100 mg Oral Daily   sodium chloride  flush  3 mL Intravenous Q12H   ticagrelor   90 mg Oral BID   Infusions:   [START ON 05/23/2024] ceFEPime  (MAXIPIME ) IV     doxycycline  (VIBRAMYCIN ) IV 100 mg (05/22/24 1635)   sodium chloride       Assessment: Pt admitted for CP and elevated troponin. Heparin  ordered for ACS.  Hgb 8.8, plt wnl, troponin 1135  Goal of Therapy:  Heparin  level 0.3-0.7 units/ml Monitor platelets by anticoagulation protocol: Yes   Plan:  Heparin  3000  units x1 Heparin  650 units/hr Check 8 hr HL Daily HL and CBC  Sergio Batch, PharmD, BCIDP, AAHIVP, CPP Infectious Disease Pharmacist 05/22/2024 4:39 PM

## 2024-05-23 ENCOUNTER — Inpatient Hospital Stay (HOSPITAL_COMMUNITY)

## 2024-05-23 LAB — BASIC METABOLIC PANEL WITH GFR
Anion gap: 9 (ref 5–15)
BUN: 21 mg/dL (ref 8–23)
CO2: 14 mmol/L — ABNORMAL LOW (ref 22–32)
Calcium: 7.8 mg/dL — ABNORMAL LOW (ref 8.9–10.3)
Chloride: 118 mmol/L — ABNORMAL HIGH (ref 98–111)
Creatinine, Ser: 1.51 mg/dL — ABNORMAL HIGH (ref 0.44–1.00)
GFR, Estimated: 38 mL/min — ABNORMAL LOW (ref >60)
Glucose, Bld: 120 mg/dL — ABNORMAL HIGH (ref 70–99)
Potassium: 4.1 mmol/L (ref 3.5–5.1)
Sodium: 141 mmol/L (ref 135–145)

## 2024-05-23 LAB — ECHOCARDIOGRAM COMPLETE
AR max vel: 1.78 cm2
AV Area VTI: 2.34 cm2
AV Area mean vel: 1.59 cm2
AV Mean grad: 2 mmHg
AV Peak grad: 4.2 mmHg
Ao pk vel: 1.02 m/s
Area-P 1/2: 4.77 cm2
Height: 60 in
S' Lateral: 2.9 cm
Weight: 1756.63 [oz_av]

## 2024-05-23 LAB — PROCALCITONIN: Procalcitonin: 0.22 ng/mL

## 2024-05-23 LAB — HEPARIN LEVEL (UNFRACTIONATED)
Heparin Unfractionated: 0.28 [IU]/mL — ABNORMAL LOW (ref 0.30–0.70)
Heparin Unfractionated: 0.34 [IU]/mL (ref 0.30–0.70)

## 2024-05-23 LAB — HEMOGLOBIN A1C
Hgb A1c MFr Bld: 5.8 % — ABNORMAL HIGH (ref 4.8–5.6)
Mean Plasma Glucose: 120 mg/dL

## 2024-05-23 LAB — C-REACTIVE PROTEIN: CRP: 10.2 mg/dL — ABNORMAL HIGH (ref <1.0)

## 2024-05-23 LAB — BRAIN NATRIURETIC PEPTIDE: B Natriuretic Peptide: 1974 pg/mL — ABNORMAL HIGH (ref 0.0–100.0)

## 2024-05-23 LAB — MAGNESIUM: Magnesium: 2.3 mg/dL (ref 1.7–2.4)

## 2024-05-23 LAB — OCCULT BLOOD X 1 CARD TO LAB, STOOL: Fecal Occult Bld: POSITIVE — AB

## 2024-05-23 MED ORDER — NITROGLYCERIN 2 % TD OINT
0.5000 [in_us] | TOPICAL_OINTMENT | Freq: Four times a day (QID) | TRANSDERMAL | Status: DC
  Administered 2024-05-23 – 2024-05-25 (×9): 0.5 [in_us] via TOPICAL
  Filled 2024-05-23: qty 30

## 2024-05-23 MED ORDER — SODIUM CHLORIDE 0.9 % IV SOLN
500.0000 mg | INTRAVENOUS | Status: DC
  Administered 2024-05-23: 500 mg via INTRAVENOUS
  Filled 2024-05-23: qty 5

## 2024-05-23 MED ORDER — PANTOPRAZOLE SODIUM 40 MG PO TBEC
40.0000 mg | DELAYED_RELEASE_TABLET | Freq: Two times a day (BID) | ORAL | Status: DC
  Administered 2024-05-23 – 2024-05-26 (×7): 40 mg via ORAL
  Filled 2024-05-23 (×7): qty 1

## 2024-05-23 MED ORDER — AZITHROMYCIN 500 MG PO TABS: 500.0000 mg | ORAL_TABLET | Freq: Every day | ORAL | Status: DC

## 2024-05-23 MED ORDER — POTASSIUM CHLORIDE CRYS ER 20 MEQ PO TBCR
40.0000 meq | EXTENDED_RELEASE_TABLET | Freq: Once | ORAL | Status: AC
  Administered 2024-05-23: 40 meq via ORAL
  Filled 2024-05-23: qty 2

## 2024-05-23 MED ORDER — DOXYCYCLINE HYCLATE 100 MG PO TABS
100.0000 mg | ORAL_TABLET | Freq: Two times a day (BID) | ORAL | Status: DC
  Administered 2024-05-23 – 2024-05-26 (×7): 100 mg via ORAL
  Filled 2024-05-23 (×7): qty 1

## 2024-05-23 MED ORDER — ASPIRIN 81 MG PO TBEC: 81.0000 mg | DELAYED_RELEASE_TABLET | Freq: Every day | ORAL | Status: DC

## 2024-05-23 MED ORDER — RANOLAZINE ER 500 MG PO TB12
500.0000 mg | ORAL_TABLET | Freq: Two times a day (BID) | ORAL | Status: DC
  Administered 2024-05-23 – 2024-05-27 (×8): 500 mg via ORAL
  Filled 2024-05-23 (×10): qty 1

## 2024-05-23 MED ORDER — MAGNESIUM SULFATE IN D5W 1-5 GM/100ML-% IV SOLN
1.0000 g | Freq: Once | INTRAVENOUS | Status: AC
  Administered 2024-05-23: 1 g via INTRAVENOUS
  Filled 2024-05-23: qty 100

## 2024-05-23 MED ORDER — ALUM & MAG HYDROXIDE-SIMETH 200-200-20 MG/5ML PO SUSP
60.0000 mL | Freq: Once | ORAL | Status: AC
  Administered 2024-05-23: 60 mL via ORAL
  Filled 2024-05-23: qty 60

## 2024-05-23 MED ORDER — FUROSEMIDE 10 MG/ML IJ SOLN
20.0000 mg | Freq: Once | INTRAMUSCULAR | Status: AC
  Administered 2024-05-23: 20 mg via INTRAVENOUS
  Filled 2024-05-23: qty 2

## 2024-05-23 MED ORDER — AZITHROMYCIN 500 MG PO TABS
500.0000 mg | ORAL_TABLET | Freq: Every day | ORAL | Status: DC
  Administered 2024-05-24 – 2024-05-26 (×3): 500 mg via ORAL
  Filled 2024-05-23 (×3): qty 1

## 2024-05-23 NOTE — Social Work (Signed)
 Per chart review pt is from [facility]. Cassidy Larson , [phone], is the contact person for that facility. It is unclear if pt has a legal guardian.   Cena Ligas, LCSW Clinical Social Worker

## 2024-05-23 NOTE — Plan of Care (Signed)

## 2024-05-23 NOTE — Consult Note (Signed)
 Palliative Medicine Inpatient Consult Note  Consulting Provider: Dr. Dennise  Reason for consult:   Palliative Care Consult Services Palliative Medicine Consult  Reason for Consult? NSTEMI, CHF, GOC   05/23/2024  HPI:  Per intake H&P -->  Cassidy Larson is a 68 y.o. female with medical history significant of ASCVD and MI status post PCI October 2023, RA, hypertension, hyperlipidemia, CKD 3B, SLE. Admitted with chest pain and difficulty breathing following a recent hospitalization for pneumonia. Palliative care has been asked to support additional goals of care conversations in the setting of sepsis.  Clinical Assessment/Goals of Care:  *Please note that this is a verbal dictation therefore any spelling or grammatical errors are due to the Dragon Medical One system interpretation.  I have reviewed medical records including EPIC notes, labs and imaging, received report from bedside RN, assessed the patient who is lying in bed generally frail in appearance.    I met with Cassidy Larson to further discuss diagnosis prognosis, GOC, EOL wishes, disposition and options.   I introduced Palliative Medicine as specialized medical care for people living with serious illness. It focuses on providing relief from the symptoms and stress of a serious illness. The goal is to improve quality of life for both the patient and the family.  Medical History Review and Understanding:  A review of Cassidy Larson's past medical history significant for hypertension, hyperlipidemia, chronic kidney disease, rheumatoid arthritis, congestive heart failure, lupus, & polysubstance abuse was completed.  Social History:  Cassidy Larson shares that she was born in [place] originally.  She has lived in various places throughout her life though for the majority of her life was raised in [place] .  She traveled to both [place]  and [place]  throughout her adulthood.  Cassidy Larson shares that she is not married nor does  she have children.  She expresses that she is the only person available who can represent herself.  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  She is a woman of faith practicing within Christianity.    Cassidy Larson expresses not much recently has been giving her joy in general.  Functional and Nutritional State:  Prior to hospitalization Cassidy Larson was living in a group home, [facility]. She was able to complete all bADL's.   Advance Directives:  A detailed discussion was had today regarding advanced directives.  Patient shares with me that she has never completed advanced directives.  She notes that she is her decision-maker and there is no one else at this time who can act on her behalf.  Code Status:  Concepts specific to code status, artifical feeding and hydration, continued IV antibiotics and rehospitalization was had.  The difference between a aggressive medical intervention path  and a palliative comfort care path for this patient at this time was had.   Encouraged patient/family to consider DNR/DNI status understanding evidenced based poor outcomes in similar hospitalized patient, as the cause of arrest is likely associated with advanced chronic/terminal illness rather than an easily reversible acute cardio-pulmonary event. I explained that DNR/DNI does not change the medical plan and it only comes into effect after a person has arrested (died).  It is a protective measure to keep us  from harming the patient in their last moments of life.  Cassidy Larson was agreeable to DNR/DNI with understanding that patient would not receive CPR, defibrillation, ACLS medications,  or intubation.   Discussion:  I reviewed with Cassidy Larson the circumstances surrounding her hospitalization inclusive of her shortness of breath as a result of pneumonia.   We reviewed her heart failure and how this is a chronic progressive disease which even with medical management is anticipated over time to worsen.  Cassidy Larson shares that she has lived quite a life and utilize drugs for prolonged period of time which she herself thinks contributed to her poor heart function.  We discussed the worries associated with Cassidy Larson present clinical health in the setting of her elevated troponins.  We reviewed that this indicates strain on the heart.  I reviewed that it appears previously Cassidy Larson had received stenting in her heart and had been taking medications to support her since then.  Cassidy Larson and I discussed best case and worst-case outcomes under her current clinical state.  We reviewed the best case which is her disease burden is optimized and she improves over the oncoming hours to days.  We reviewed the worst case scenario which is that her disease burden continues to worsen to the point where additional measures are no longer supportive to her.  We reviewed if the latter is the reality the idea of keeping her comfortable to alleviate any potential suffering or healthcare burden that she may be experiencing.  Cassidy Larson expresses a strong faith in God and that if her time has come she is ready to meet the Lord.  Cassidy Larson and I reviewed allowing time to see if her current health state can improve and if not to focus on her comfort.  Cassidy Larson again shares with me there is no one who can speak on her behalf.  Discussed the importance of continued conversation with family and their  medical providers regarding overall plan of care and treatment options, ensuring decisions are within the context of the patients values and GOCs. __________________  Addendum:  I was able to call and speak to Cassidy Larson this morning who shares with me that Cassidy Larson has never had any family visit her at their facility. Cassidy Larson plans to call the facility that Cassidy Larson was at previously to  determine if there is any family that could speak on Cassidy Larson.  Decision Maker: Patient shares that she is her own decision maker, contrary to documentation she has no legal guardian  SUMMARY OF RECOMMENDATIONS   DNAR/DNI  Continue to allow time for outcomes  Open and honest conversations held with the patient about this case and worst-case scenarios --> worst case being despite current measures patient continues to deteriorate at which point the transition of care focus would be that of comfort  I have reached out to patient's group home to see if there is a surrogate decision maker listed anywhere though as of presently no one has been identified --> have escalated this to the social work team  Ongoing palliative care support  Code Status/Advance Care Planning: DNAR/DNI   Palliative Prophylaxis:  Aspiration, Bowel Regimen, Delirium Protocol, Frequent Pain Assessment, Oral Care, Palliative Wound Care, and Turn Reposition  Additional Recommendations (Limitations, Scope, Preferences): Continue current care  Psycho-social/Spiritual:  Desire for further Chaplaincy support: Yes patient is a Christian Additional Recommendations: Education on disease burden   Prognosis: Concerning at this time  Discharge Planning: To be determined  Vitals:   05/23/24 0400 05/23/24 0434  BP: 102/64   Pulse: 82 87  Resp: (!) 30 (!) 21  Temp: 98 F (36.7 C)   SpO2: 91% 95%    Intake/Output Summary (  Last 24 hours) at 05/23/2024 0718 Last data filed at 05/22/2024 2105 Gross per 24 hour  Intake 13 ml  Output --  Net 13 ml   Last Weight  Most recent update: 05/23/2024  5:33 AM    Weight  49.8 kg (109 lb 12.6 oz)            LABS: CBC:    Component Value Date/Time   WBC 10.8 (H) 05/22/2024 1304   HGB 8.8 (L) 05/22/2024 1304   HCT 26.6 (L) 05/22/2024 1304   PLT 265 05/22/2024 1304   MCV 92.7 05/22/2024 1304   NEUTROABS 8.3 (H) 05/22/2024 1304   LYMPHSABS 1.1 05/22/2024 1304    MONOABS 1.0 05/22/2024 1304   EOSABS 0.2 05/22/2024 1304   BASOSABS 0.0 05/22/2024 1304   Comprehensive Metabolic Panel:    Component Value Date/Time   NA 140 05/22/2024 0732   K 3.7 05/22/2024 0732   CL 110 05/22/2024 0732   CO2 17 (L) 05/22/2024 0732   BUN 22 05/22/2024 0732   CREATININE 1.54 (H) 05/22/2024 0732   GLUCOSE 168 (H) 05/22/2024 0732   CALCIUM  8.3 (L) 05/22/2024 0732   CALCIUM  9.2 07/30/2023 0000   AST 47 (H) 05/22/2024 0732   ALT 52 (H) 05/22/2024 0732   ALKPHOS 105 05/22/2024 0732   BILITOT 0.3 05/22/2024 0732   PROT 6.9 05/22/2024 0732   ALBUMIN 3.2 (L) 05/22/2024 0732   Gen: Elderly African-American female chronically ill in appearance HEENT: Dry mucous membranes CV: Regular rate and rhythm PULM: On 6 L nasal cannula breathing is rapid ABD: soft/nontender EXT: No edema Neuro: Alert and oriented x2-3  PPS: 30%   This conversation/these recommendations were discussed with patient primary care team, Dr. Dennise ______________________________________________________ Ros[facility] Palliative Medicine Team Team Cell Phone: [phone] Please utilize secure chat with additional questions, if there is no response within 30 minutes please call the above phone number  Total Time: 75 Billing based on MDM: High  Palliative Medicine Team providers are available by phone from 7am to 7pm daily and can be reached through the team cell phone.  Should this patient require assistance outside of these hours, please call the patient's attending physician.

## 2024-05-23 NOTE — Progress Notes (Signed)
 ANTICOAGULATION CONSULT NOTE  Pharmacy Consult for Heparin  Indication: chest pain/ACS  Allergies  Allergen Reactions   Dorethia Imperial ] Diarrhea, Nausea And Vomiting and Other (See Comments)    Severe stomach cramps Diarrhea (sometimes with blood)   Latex Itching   Other Rash    Cotton fabric - rash    Patient Measurements: Height: 5' (152.4 cm) Weight: 49.8 kg (109 lb 12.6 oz) IBW/kg (Calculated) : 45.5 Heparin  Dosing Weight: 45.5 kg  Vital Signs: Temp: 97.7 F (36.5 C) (12/07 1935) Temp Source: Oral (12/07 1935) BP: 106/70 (12/07 1935) Pulse Rate: 83 (12/07 1935)  Labs: Recent Labs    05/22/24 0731 05/22/24 0732 05/22/24 1304 05/22/24 1604 05/23/24 0749 05/23/24 1852  HGB 9.1*  --  8.8*  --   --   --   HCT 28.6*  --  26.6*  --   --   --   PLT 284  --  265  --   --   --   APTT  --  39*  --   --   --   --   LABPROT  --  17.0*  --   --   --   --   INR  --  1.3*  --   --   --   --   HEPARINUNFRC  --   --   --   --  0.28* 0.34  CREATININE  --  1.54*  --   --  1.51*  --   TROPONINIHS  --   --  1,135* 2,686*  --   --     Estimated Creatinine Clearance: 26.3 mL/min (A) (by C-G formula based on SCr of 1.51 mg/dL (H)).   Medical History: Past Medical History:  Diagnosis Date   Anxiety    Blood transfusion without reported diagnosis    CAD in native artery residual post LAD stent, 100% RCA and 80% LCx.  03/30/2022   CHF (congestive heart failure) (HCC)    CKD (chronic kidney disease) stage 4, GFR 15-29 ml/min (HCC)    Cocaine abuse (HCC)    Essential hypertension    HLD (hyperlipidemia) 03/30/2022   Lupus    On mechanically assisted ventilation (HCC) extubated 03/28/22  03/28/2022   Polysubstance abuse (HCC)    Positive ANA (antinuclear antibody)    S/P angioplasty with stent to mLAD 03/27/22 03/30/2022    Medications:  Medications Prior to Admission  Medication Sig Dispense Refill Last Dose/Taking   adalimumab (HUMIRA) 40 MG/0.8ML AJKT pen Inject 40 mg  into the skin every 14 (fourteen) days.      amLODipine (NORVASC) 2.5 MG tablet Take 2.5 mg by mouth daily.      amoxicillin -clavulanate (AUGMENTIN ) 875-125 MG tablet Take 1 tablet by mouth 2 (two) times daily. 10 tablet 0    atorvastatin  (LIPITOR ) 40 MG tablet Take 40 mg by mouth at bedtime.      calcium  carbonate (OS-CAL - DOSED IN MG OF ELEMENTAL CALCIUM ) 1250 (500 Ca) MG tablet Take 1 tablet (1,250 mg total) by mouth daily with breakfast. 30 tablet 0    carvedilol  (COREG ) 6.25 MG tablet Take 1 tablet by mouth 2 (two) times daily with a meal.      dextromethorphan -guaiFENesin  (MUCINEX  DM) 30-600 MG 12hr tablet Take 1 tablet by mouth 2 (two) times daily. 10 tablet 0    GOODSENSE PAIN RELIEF EXTRA ST 500 MG tablet Take 500 mg by mouth every 8 (eight) hours as needed for mild pain (pain score 1-3).  hydrALAZINE  (APRESOLINE ) 25 MG tablet Take 1 tablet by mouth 3 (three) times daily.      hydroxychloroquine (PLAQUENIL) 200 MG tablet Take by mouth.      hydrOXYzine (ATARAX) 50 MG tablet Take 50 mg by mouth 3 (three) times daily.      isosorbide  mononitrate (IMDUR ) 30 MG 24 hr tablet Take 1 tablet (30 mg total) by mouth daily. 30 tablet 1    loperamide  (IMODIUM ) 2 MG capsule Take 1 capsule (2 mg total) by mouth as needed for diarrhea or loose stools. 30 capsule 0    mirtazapine  (REMERON ) 7.5 MG tablet Take 7.5 mg by mouth at bedtime.      sertraline  (ZOLOFT ) 100 MG tablet Take 100 mg by mouth daily.      ticagrelor  (BRILINTA ) 90 MG TABS tablet Take 1 tablet (90 mg total) by mouth 2 (two) times daily. 30 tablet 1    Scheduled:   atorvastatin   40 mg Oral Daily   [START ON 05/24/2024] azithromycin   500 mg Oral Daily   doxycycline   100 mg Oral Q12H   Gerhardt's butt cream   Topical BID   guaiFENesin   600 mg Oral BID   nitroGLYCERIN   0.5 inch Topical Q6H   pantoprazole   40 mg Oral BID AC   ranolazine   500 mg Oral BID   sertraline   100 mg Oral Daily   sodium chloride  flush  3 mL Intravenous Q12H    ticagrelor   90 mg Oral BID   Infusions:   heparin  750 Units/hr (05/23/24 0949)   PRN: acetaminophen  **OR** acetaminophen , albuterol , morphine  injection, trimethobenzamide   Assessment: Pt admitted for CP and elevated troponin. Heparin  ordered for ACS.   Heparin  level is subtherapeutic this morning at 0.28 >> increased to 750 units/hr Heparin  level this evening 0.34 >> Therapeutic  Hgb 9.1>8.8; plt 265  Goal of Therapy:  Heparin  level 0.3-0.7 units/ml Monitor platelets by anticoagulation protocol: Yes   Plan:  Continue heparin  infusion at 750 units/hr Check anti-Xa level at 8  Daily heparin  level while on heparin  Continue to monitor H&H and platelets  Dorn Buttner, PharmD, BCPS 05/23/2024 7:51 PM ED Clinical Pharmacist -  [phone]

## 2024-05-23 NOTE — Progress Notes (Signed)
 Progress Note  Patient Name: Cassidy Larson Date of Encounter: 05/23/2024 Primary Cardiologist: Lonni Cash, MD   Subjective   At first evaluation patient noted no chest pain: Persevates about God. At second evaluation patient noted: back pain, abdominal pain, chest pain, leg pain- all unchanged. At third evaluation with RN, perseverates about God.  Notes she is done with cocaine.  Unclear if she is amenable to procedural evaluation.  Vital Signs    Vitals:   05/23/24 0400 05/23/24 0434 05/23/24 0500 05/23/24 0758  BP: 102/64   (!) 122/57  Pulse: 82 87  86  Resp: (!) 30 (!) 21  (!) 30  Temp: 98 F (36.7 C)   97.6 F (36.4 C)  TempSrc: Axillary   Oral  SpO2: 91% 95%  96%  Weight:   49.8 kg   Height:        Intake/Output Summary (Last 24 hours) at 05/23/2024 0803 Last data filed at 05/22/2024 2105 Gross per 24 hour  Intake 13 ml  Output --  Net 13 ml   Filed Weights   05/22/24 0725 05/23/24 0500  Weight: 45.5 kg 49.8 kg    Physical Exam   GEN: Chronically ill appearing, thin and frail Neck: No JVD Cardiac: RRR, no murmurs, rubs, or gallops.  Respiratory: Clear to auscultation bilaterally. GI: Soft, nontender, non-distended  MS: No edema  Labs   Telemetry: SR   Chemistry Recent Labs  Lab 05/18/24 0458 05/19/24 0429 05/22/24 0732  NA 139 141 140  K 3.4* 3.5 3.7  CL 107 110 110  CO2 20* 21* 17*  GLUCOSE 66* 116* 168*  BUN 15 14 22   CREATININE 1.35* 1.38* 1.54*  CALCIUM  7.9* 8.1* 8.3*  PROT  --   --  6.9  ALBUMIN  --   --  3.2*  AST  --   --  47*  ALT  --   --  52*  ALKPHOS  --   --  105  BILITOT  --   --  0.3  GFRNONAA 43* 42* 37*  ANIONGAP 12 10 13      Hematology Recent Labs  Lab 05/19/24 0429 05/22/24 0731 05/22/24 1304  WBC 8.5 10.7* 10.8*  RBC 3.08* 3.05* 2.87*  HGB 9.3* 9.1* 8.8*  HCT 28.7* 28.6* 26.6*  MCV 93.2 93.8 92.7  MCH 30.2 29.8 30.7  MCHC 32.4 31.8 33.1  RDW 13.4 13.8 13.8  PLT 158 284 265    Cardiac  Enzymes  Recent Labs  Lab 05/22/24 0732  TRNPT 61*     BNP Recent Labs  Lab 05/22/24 1104  BNP 801.9*     DDimer No results for input(s): DDIMER in the last 168 hours.   Cardiac Studies   Cardiac Studies & Procedures   ______________________________________________________________________________________________ CARDIAC CATHETERIZATION  CARDIAC CATHETERIZATION 03/27/2022  Conclusion   Prox RCA to Mid RCA lesion is 100% stenosed.   Mid Cx lesion is 80% stenosed.   Mid LAD lesion is 100% stenosed.   A drug-eluting stent was successfully placed using a SYNERGY XD 2.50X16.   Post intervention, there is a 0% residual stenosis.  Acute anterior STEMI secondary to thrombotic occlusion of the mid LAD Successful PTCA/DES x 1 mid LAD Moderately severe mid Circumflex stenosis Dominant RCA with chronic occlusion of the mid RCA. The distal RCA branches fill from left to right collaterals. LVEDP=17 mmHg Pt intubated during case secondary to agitation.  Recommendations: Will admit to the ICU. Will ask PCCM team to assist in ventilator management.  Will continue Aggrastat  infusion for 4 hours. Will give Brilinta  180 mg po x 1 once OG tube in place. Will then continue ASA/Brilinta  for one year. It does not appear that she has a true ASA allergy. Will start high intensity statin. Echo in am. No beta blocker given cocaine abuse. Consider PCI of the Circumflex but this lesion is not critical and it may be best to observe her compliance with medical therapy and cocaine abstinence before placing additional coronary stents.  Findings Coronary Findings Diagnostic  Dominance: Right  Left Anterior Descending Vessel is large. Mid LAD lesion is 100% stenosed. The lesion is thrombotic.  Left Circumflex Vessel is large. Mid Cx lesion is 80% stenosed.  Right Coronary Artery Vessel is large. Prox RCA to Mid RCA lesion is 100% stenosed. The lesion is chronically occluded.  Third Right  Posterolateral Branch Collaterals 3rd RPL filled by collaterals from 2nd Sept.  Intervention  Mid LAD lesion Stent CATH VISTA GUIDE 6FR XBLAD3.5 guide catheter was inserted. Lesion crossed with guidewire using a WIRE COUGAR XT STRL 190CM. Pre-stent angioplasty was performed using a BALLN TREK OTW 2.5X12. A drug-eluting stent was successfully placed using a SYNERGY XD 2.50X16. Stent strut is well apposed. Post-stent angioplasty was performed using a BALL SAPPHIRE NC24 [id number]. Post-Intervention Lesion Assessment The intervention was successful. Pre-interventional TIMI flow is 0. Post-intervention TIMI flow is 3. No complications occurred at this lesion. There is a 0% residual stenosis post intervention.     ECHOCARDIOGRAM  ECHOCARDIOGRAM LIMITED 04/22/2022  Narrative ECHOCARDIOGRAM LIMITED REPORT    Patient Name:   Cassidy Larson Date of Exam: 04/22/2022 Medical Rec #:  [id number]        Height:       62.0 in Accession #:    [id number]       Weight:       86.0 lb Date of Birth:  31-Jul-1955        BSA:          1.335 m Patient Age:    68 years         BP:           132/65 mmHg Patient Gender: F                HR:           54 bpm. Exam Location:  Inpatient  Procedure: Limited Echo and Color Doppler  Indications:    Chest pain  History:        Patient has prior history of Echocardiogram examinations. CAD and Previous Myocardial Infarction; Risk Factors:Hypertension and Dyslipidemia. Polysubstance use, cocaine use, Lupus.  Sonographer:    Clotilda Center Referring Phys: PERRI RADDLE, A, CALDWELL   Sonographer Comments: Image acquisition challenging due to respiratory motion and Image acquisition challenging due to patient body habitus. IMPRESSIONS   1. Left ventricular ejection fraction, by estimation, is 50 to 55%. The left ventricle has low normal function. The left ventricle demonstrates regional wall motion abnormalities. Apical hypokinesis. 2. Right ventricular systolic  function is normal. The right ventricular size is normal. 3. The mitral valve is normal in structure. Trivial mitral valve regurgitation. 4. The aortic valve was not well visualized. Aortic valve regurgitation is not visualized.  FINDINGS Left Ventricle: Left ventricular ejection fraction, by estimation, is 50 to 55%. The left ventricle has low normal function. The left ventricle demonstrates regional wall motion abnormalities. The left ventricular internal cavity size was normal in size. There is no left ventricular hypertrophy.  Right Ventricle: The right ventricular size is normal. No increase in right ventricular wall thickness. Right ventricular systolic function is normal.  Pericardium: There is no evidence of pericardial effusion.  Mitral Valve: The mitral valve is normal in structure. Trivial mitral valve regurgitation.  Tricuspid Valve: The tricuspid valve is normal in structure. Tricuspid valve regurgitation is trivial.  Aortic Valve: The aortic valve was not well visualized. Aortic valve regurgitation is not visualized.  Additional Comments: Color Doppler performed.  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      Stanly Leavens, MD FASE [facility] Cardiologist [facility]  [address] [place], [address] [place], [place] [address] [phone]  8:03 AM

## 2024-05-23 NOTE — Plan of Care (Signed)
  Problem: Health Behavior/Discharge Planning: Goal: Ability to manage health-related needs will improve Outcome: Progressing   Problem: Clinical Measurements: Goal: Will remain free from infection Outcome: Progressing Goal: Respiratory complications will improve Outcome: Progressing   Problem: Activity: Goal: Risk for activity intolerance will decrease Outcome: Progressing   Problem: Nutrition: Goal: Adequate nutrition will be maintained Outcome: Progressing   Problem: Coping: Goal: Level of anxiety will decrease Outcome: Progressing   Problem: Pain Managment: Goal: General experience of comfort will improve and/or be controlled Outcome: Progressing   Problem: Skin Integrity: Goal: Risk for impaired skin integrity will decrease Outcome: Progressing

## 2024-05-23 NOTE — Progress Notes (Signed)
 PROGRESS NOTE     Patient Demographics:    Cassidy Larson, is a 68 y.o. female, DOB - 07-15-1955, FMW:[id number]  Outpatient Primary MD for the patient is [name], FNP    LOS - 1  Admit date - 05/22/2024    Chief Complaint  Patient presents with   Chest Pain       Brief Narrative (HPI from H&P)   68 y.o. 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   She was  just recently been hospitalized from 11/29-12/4 with concern for sepsis thought possibly secondary to a left-sided bacterial community-acquired pneumonia.  Blood cultures were negative and patient was treated with IV cefepime  and transition to oral Augmentin  to complete a 5-day course at discharge. Records note urine Legionella cultures were positive from admission the last admission.   In the ER her workup was consistent with NSTEMI, CHF with acute hypoxic respiratory failure, possible worsening pneumonia and she was admitted to the hospital for    Subjective:    Tyneshia Stivers today has, No headache, No abdominal pain - No Nausea, No new weakness tingling or numbness, +ve SOB and chest pain   Assessment  & Plan :   Acute respiratory failure with hypoxia with sepsis, community-acquired pneumonia, recent Legionella pneumonia in the setting of NSTEMI and acute on chronic diastolic CHF.  Patient was recently diagnosed with Legionella pneumonia, CT scan shows worsening pulmonary edema and pneumonia, will be placed on azithromycin  along with Augmentin , azithromycin  total  7 days Augmentin  x 5, she is already on heparin  drip for NSTEMI, Brilinta  and statin, she is allergic to aspirin .  Diurese as tolerated by blood pressure and kidneys, cardiology has seen the patient, patient prefers general medical treatment if declines full comfort measures.  She is DNR and not a candidate for aggressive invasive procedures or treatment modalities.  Continue present care, supplemental oxygen, I-S and flutter valve for pulmonary toiletry and monitor.  Overall prognosis is  guarded.  NSTEMI with acute on chronic diastolic congestive heart failure exacerbation Suspected acute on chronic.  CTA of the chest noted concern for pulmonary edema with signs of right heart failure.  Last echocardiogram noted EF to be around 50-55% with apical hypokinesis back in 2023.  Heparin  drip, Brilinta , statin, Lasix  as tolerated, supplemental oxygen and monitor.  Echocardiogram pending.  Cardiology following.  Low-dose nitro paste and Ranexa  for chest pain, trial of Maalox.  Blood pressure is soft.    Hypotension Supportive care   Normocytic anemia Chronic hemoglobin noted to be 9.1 which appears similar to when he was recently discharged from the hospital. - Continue to monitor, agreeable for transfusion if needed   Chronic kidney disease stage IIIb Creatinine noted to be 1.54 with BUN 22. At risk for AKI.  Due to hypotension, NSTEMI, monitor.   CAD s/p PCI Remote history of stenting of the mid LAD - Continue Brilinta , Lipitor    Prolonged QT interval Chronic.  QTc 512 - Monitor electrolytes, minimize QT prolonging medications as much as practically possible, due to recent Legionella pneumonia and worsening pneumonia on CT despite treatment before azithromycin  x 7 days.   Systemic lupus Review of records note patient with a history of positive ANA back in 2019.  Previously had been on Plaquenil   Mixed hyperlipidemia - Continue Lipitor    Dysthymic disorder - Continue Zoloft  but held  mirtazapine    Elevated liver enzymes Acute.  AST 47 and ALT 52.  Possibly secondary to passive congestion from patient being fluid overload. - Continue to monitor   Tobacco abuse Patient just recently stopped smoking in the last week. - Continue to encourage cessation of tobacco use  Incidental finding on CT.  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 Consider follow-up imaging with repeat CT of the chest in 1 to 3 months to ensure stability and/or resolution.  PCP to monitor.      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  Code Status : DNR  Consults  : Cardiology, palliative care  PUD Prophylaxis : PPI.   Procedures  :     CT - 1. No evidence of acute aortic syndrome. 2. Progressive left upper lobe pneumonia. 3. Diffuse pulmonary edema and small left pleural effusion. Reflux of contrast material into the IVC and hepatic veins consistent with right heart failure. 4. No central pulmonary arterial occlusion. 5. Enlarging mediastinal lymph nodes are nonspecific in the setting of chf and pneumonia. 6. 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 Consider follow-up imaging with repeat CT of the chest in 1 to 3 months to ensure stability and/or resolution. 7. Aortic atherosclerotic calcifications and coronary artery calcifications. 8. Progressive increased size of bilateral inguinal and left external iliac lymph nodes compared with 05/17/2024. Nonspecific but favor reactive changes.      Disposition Plan  :    Status is: Inpatient  DVT Prophylaxis  :  Hep gtt   Lab Results  Component Value Date    PLT 265 05/22/2024    Diet :  Diet Order             DIET DYS 3  Room service appropriate? Yes; Fluid consistency: Thin  Diet effective now                    Inpatient Medications  Scheduled Meds:  alum & mag hydroxide-simeth  60 mL Oral Once   atorvastatin   40 mg Oral Daily   azithromycin   500 mg Oral Daily   doxycycline   100 mg Oral Q12H   furosemide   20 mg Intravenous Once   Gerhardt's butt cream   Topical BID   guaiFENesin   600 mg Oral BID   nitroGLYCERIN   0.5 inch Topical Q6H   pantoprazole   40 mg Oral BID AC   ranolazine   500 mg Oral BID   sertraline   100 mg Oral Daily   sodium chloride  flush  3 mL Intravenous Q12H   ticagrelor   90 mg Oral BID   Continuous Infusions:  heparin  650 Units/hr (05/22/24 1820)   PRN Meds:.acetaminophen  **OR** acetaminophen , albuterol , morphine  injection, trimethobenzamide   Antibiotics  :    Anti-infectives (From admission, onward)    Start     Dose/Rate Route Frequency Ordered Stop   05/23/24 1300  ceFEPIme  (MAXIPIME ) 2 g in sodium chloride  0.9 % 100 mL IVPB  Status:  Discontinued        2 g 200 mL/hr over 30 Minutes Intravenous Every 24 hours 05/22/24 1309 05/23/24 0832   05/23/24 1000  azithromycin  (ZITHROMAX ) 500 mg in sodium chloride  0.9 % 250 mL IVPB  Status:  Discontinued        500 mg 250 mL/hr over 60 Minutes Intravenous Every 24 hours 05/23/24 0533 05/23/24 0832   05/23/24 1000  azithromycin  (ZITHROMAX ) tablet 500 mg        500 mg Oral Daily 05/23/24 0833 05/28/24 0959   05/23/24 1000  doxycycline  (VIBRA -TABS) tablet 100 mg        100 mg Oral Every 12 hours 05/23/24 0833 05/28/24 0959   05/22/24 1200  doxycycline  (VIBRAMYCIN ) 100 mg in sodium chloride  0.9 % 250 mL IVPB  Status:  Discontinued        100 mg 125 mL/hr over 120 Minutes Intravenous Every 12 hours 05/22/24 1159 05/23/24 0533   05/22/24 1130  ceFEPIme  (MAXIPIME ) 2 g in sodium chloride  0.9 % 100 mL IVPB        2 g 200 mL/hr over 30 Minutes Intravenous  Once  05/22/24 1116 05/22/24 1330         Objective:   Vitals:   05/23/24 0400 05/23/24 0434 05/23/24 0500 05/23/24 0758  BP: 102/64   (!) 122/57  Pulse: 82 87  86  Resp: (!) 30 (!) 21  (!) 30  Temp: 98 F (36.7 C)   97.6 F (36.4 C)  TempSrc: Axillary   Oral  SpO2: 91% 95%  96%  Weight:   49.8 kg   Height:        Wt Readings from Last 3 Encounters:  05/23/24 49.8 kg  05/15/24 45.2 kg  10/20/22 43.5 kg     Intake/Output Summary (Last 24 hours) at 05/23/2024 0833 Last data filed at 05/23/2024 9173 Gross per 24 hour  Intake 18 ml  Output --  Net 18 ml     Physical Exam  Awake Alert, No new F.N deficits, Normal affect [place].AT,PERRAL Supple Neck, No JVD,   Symmetrical Chest wall movement, Good air movement bilaterally, CTAB RRR,No Gallops,Rubs or new Murmurs,  +ve B.Sounds, Abd Soft, No tenderness,   No Cyanosis, Clubbing or edema  Data Review:    Recent Labs  Lab 05/17/24 0453 05/18/24 0458 05/19/24 0429 05/22/24 0731 05/22/24 1304  WBC 10.7* 10.1 8.5 10.7* 10.8*  HGB 10.1* 9.1* 9.3* 9.1* 8.8*  HCT 30.9* 27.6* 28.7* 28.6* 26.6*  PLT 139* 149* 158 284 265  MCV 93.9 93.9 93.2 93.8 92.7  MCH 30.7 31.0 30.2 29.8 30.7  MCHC 32.7 33.0 32.4 31.8 33.1  RDW 13.4 13.5 13.4 13.8 13.8  LYMPHSABS 0.8 1.1 1.2 1.6 1.1  MONOABS 1.4* 1.6* 1.8* 1.1* 1.0  EOSABS 0.0 0.0 0.1 0.5 0.2  BASOSABS 0.0 0.0 0.0 0.0 0.0    Recent Labs  Lab 05/17/24 0453 05/18/24 0458 05/19/24 0429 05/22/24 0732 05/22/24 1104 05/22/24 1304  NA 142 139 141 140  --   --   K 3.6 3.4* 3.5 3.7  --   --   CL 110 107 110 110  --   --   CO2 21* 20* 21* 17*  --   --   ANIONGAP 12 12 10 13   --   --   GLUCOSE 53* 66* 116* 168*  --   --   BUN 17 15 14 22   --   --   CREATININE 1.38* 1.35* 1.38* 1.54*  --   --   AST  --   --   --  47*  --   --   ALT  --   --   --  52*  --   --   ALKPHOS  --   --   --  105  --   --   BILITOT  --   --   --  0.3  --   --   ALBUMIN  --   --   --  3.2*  --   --    PROCALCITON  --   --   --   --   --  0.17  LATICACIDVEN  --   --   --   --   --  1.0  INR  --   --   --  1.3*  --   --   BNP  --   --   --   --  801.9*  --   CALCIUM  8.0* 7.9* 8.1* 8.3*  --   --       Recent Labs  Lab 05/17/24 0453 05/18/24 0458 05/19/24 0429 05/22/24 0732 05/22/24 1104 05/22/24 1304  PROCALCITON  --   --   --   --   --  0.17  LATICACIDVEN  --   --   --   --   --  1.0  INR  --   --   --  1.3*  --   --   BNP  --   --   --   --  801.9*  --   CALCIUM  8.0* 7.9* 8.1* 8.3*  --   --     --------------------------------------------------------------------------------------------------------------- Lab Results  Component Value Date   CHOL 95 05/22/2024   HDL 18 (L) 05/22/2024   LDLCALC 43 05/22/2024   TRIG 173 (H) 05/22/2024   CHOLHDL 5.3 05/22/2024    Lab Results  Component Value Date   HGBA1C 5.5 03/27/2022   No results for input(s): TSH, T4TOTAL, FREET4, T3FREE, THYROIDAB in the last 72 hours. No results for input(s): VITAMINB12, FOLATE, FERRITIN, TIBC, IRON, RETICCTPCT in the last 72 hours. ------------------------------------------------------------------------------------------------------------------ Cardiac Enzymes No results for input(s): CKMB, TROPONINI, MYOGLOBIN in the last 168 hours.  Invalid input(s): CK  Micro Results Recent Results (from the past 240 hours)  Blood Culture (routine x 2)     Status: None   Collection Time: 05/15/24  8:24 PM   Specimen: BLOOD LEFT WRIST  Result Value Ref Range Status   Specimen Description BLOOD LEFT WRIST  Final   Special Requests   Final    BOTTLES DRAWN AEROBIC AND ANAEROBIC Blood Culture adequate volume   Culture   Final    NO GROWTH 5 DAYS Performed at [facility], [address], [place], [place] [address]    Report Status 05/20/2024 FINAL  Final  Resp panel by RT-PCR (RSV, Flu A&B, Covid) Anterior Nasal Swab     Status: None   Collection Time: 05/15/24  8:38 PM    Specimen: Anterior Nasal Swab  Result Value Ref Range Status   SARS Coronavirus 2 by RT PCR NEGATIVE NEGATIVE Final    Comment: (NOTE) SARS-CoV-2 target nucleic acids are NOT DETECTED.  The SARS-CoV-2 RNA is generally detectable in upper respiratory specimens during the acute phase of infection. The lowest concentration of SARS-CoV-2 viral copies this assay can detect is 138 copies/mL. A negative result does not preclude SARS-Cov-2 infection and should not be used as the sole basis for treatment or other patient management decisions. A negative result may occur with  improper specimen collection/handling, submission of specimen other than nasopharyngeal swab, presence of viral mutation(s) within the areas targeted by this assay, and inadequate number of viral copies(<138 copies/mL). A negative result must be combined with clinical observations, patient history, and epidemiological information. The expected result is Negative.  Fact Sheet for Patients:  bloggercourse.com  Fact Sheet for Healthcare Providers:  seriousbroker.it  This test is no t yet approved or cleared by the [place]  FDA and  has been authorized for detection and/or diagnosis of SARS-CoV-2 by FDA under an Emergency Use Authorization (EUA). This EUA will remain  in effect (meaning this test can be used) for the duration of the COVID-19 declaration under Section 564(b)(1) of the Act, 21 U.S.C.section 360bbb-3(b)(1), unless the authorization is terminated  or revoked sooner.       Influenza A by PCR NEGATIVE NEGATIVE Final   Influenza B by PCR NEGATIVE NEGATIVE Final    Comment: (NOTE) The Xpert Xpress SARS-CoV-2/FLU/RSV plus assay is intended as an aid in the diagnosis of influenza from Nasopharyngeal swab specimens and should not be used as a sole basis for treatment. Nasal washings and aspirates are unacceptable for Xpert Xpress  SARS-CoV-2/FLU/RSV testing.  Fact Sheet for Patients: bloggercourse.com  Fact Sheet for Healthcare Providers: seriousbroker.it  This test is not yet approved or cleared by the [place]  FDA and has been authorized for detection and/or diagnosis of SARS-CoV-2 by FDA under an Emergency Use Authorization (EUA). This EUA will remain in effect (meaning this test can be used) for the duration of the COVID-19 declaration under Section 564(b)(1) of the Act, 21 U.S.C. section 360bbb-3(b)(1), unless the authorization is terminated or revoked.     Resp Syncytial Virus by PCR NEGATIVE NEGATIVE Final    Comment: (NOTE) Fact Sheet for Patients: bloggercourse.com  Fact Sheet for Healthcare Providers: seriousbroker.it  This test is not yet approved or cleared by the [place]  FDA and has been authorized for detection and/or diagnosis of SARS-CoV-2 by FDA under an Emergency Use Authorization (EUA). This EUA will remain in effect (meaning this test can be used) for the duration of the COVID-19 declaration under Section 564(b)(1) of the Act, 21 U.S.C. section  360bbb-3(b)(1), unless the authorization is terminated or revoked.  Performed at [facility], [address], [place], [place] [address]   Blood Culture (routine x 2)     Status: None   Collection Time: 05/15/24  8:38 PM   Specimen: Right Antecubital; Blood  Result Value Ref Range Status   Specimen Description RIGHT ANTECUBITAL  Final   Special Requests   Final    BOTTLES DRAWN AEROBIC ONLY Blood Culture adequate volume   Culture   Final    NO GROWTH 5 DAYS Performed at [facility], [address] [place], [place] [address]    Report Status 05/20/2024 FINAL  Final  C Difficile Quick Screen w PCR reflex     Status: None   Collection Time: 05/16/24  3:14 AM   Specimen: STOOL  Result Value Ref Range Status   C Diff antigen  NEGATIVE NEGATIVE Final   C Diff toxin NEGATIVE NEGATIVE Final   C Diff interpretation No C. difficile detected.  Final    Comment: Performed at [facility], [address] [place], [place] [address]  Gastrointestinal Panel by PCR , Stool     Status: None   Collection Time: 05/16/24  2:40 PM   Specimen: Stool  Result Value Ref Range Status   Campylobacter species NOT DETECTED NOT DETECTED Final   Plesimonas shigelloides NOT DETECTED NOT DETECTED Final   Salmonella species NOT DETECTED NOT DETECTED Final   Yersinia enterocolitica NOT DETECTED NOT DETECTED Final   Vibrio species NOT DETECTED NOT DETECTED Final   Vibrio cholerae NOT DETECTED NOT DETECTED Final   Enteroaggregative E coli (EAEC) NOT DETECTED NOT DETECTED Final   Enteropathogenic E coli (EPEC) NOT DETECTED NOT DETECTED Final   Enterotoxigenic E coli (ETEC) NOT DETECTED NOT DETECTED Final   Shiga like toxin producing E coli (STEC) NOT DETECTED NOT DETECTED Final   Shigella/Enteroinvasive E coli (EIEC) NOT DETECTED NOT DETECTED Final   Cryptosporidium NOT DETECTED NOT DETECTED Final   Cyclospora cayetanensis NOT DETECTED NOT DETECTED Final   Entamoeba histolytica NOT DETECTED NOT DETECTED Final   Giardia lamblia NOT DETECTED NOT DETECTED Final   Adenovirus F40/41 NOT DETECTED NOT DETECTED Final   Astrovirus NOT DETECTED NOT DETECTED Final   Norovirus GI/GII NOT DETECTED NOT DETECTED Final   Rotavirus A NOT DETECTED NOT DETECTED Final   Sapovirus (I, II, IV, and V) NOT DETECTED NOT DETECTED Final    Comment: Performed at [facility], [address] [place], [place] [address]  MRSA Next Gen by PCR, Nasal     Status: None   Collection Time: 05/22/24  7:03 AM  Result Value Ref Range Status   MRSA by PCR Next Gen NOT DETECTED NOT DETECTED Final    Comment: (NOTE) The GeneXpert MRSA Assay (FDA approved for NASAL specimens only), is one component of a comprehensive MRSA colonization surveillance program. It is not  intended to diagnose MRSA infection nor to guide or monitor treatment for MRSA infections. Test performance is not FDA approved in patients less than 38 years old. Performed at [facility], [address] [place], [place] [address]     Radiology Report CT Angio Chest/Abd/Pel for Dissection W and/or W/WO Result Date: 05/22/2024 EXAM: CTA CHEST, ABDOMEN AND PELVIS WITHOUT AND WITH CONTRAST 05/22/2024 09:35:07 AM TECHNIQUE: CTA of the chest was performed without and with the administration of intravenous contrast. CTA of the abdomen and pelvis was performed without and with the administration of intravenous contrast. Multiplanar reformatted  images are provided for review. MIP images are provided for review. Automated exposure control, iterative reconstruction, and/or weight based adjustment of the mA/kV was utilized to reduce the radiation dose to as low as reasonably achievable. COMPARISON: CT chest 05/15/2024 and CT abdomen and pelvis 05/17/2024. CLINICAL HISTORY: Acute aortic syndrome (AAS) suspected. FINDINGS: VASCULATURE: AORTA: Aortic atherosclerosis. No acute finding. No abdominal aortic aneurysm. No dissection. PULMONARY ARTERIES: The central pulmonary arteries are patent. No pulmonary embolism with the limits of this exam. GREAT VESSELS OF AORTIC ARCH: No acute finding. No dissection. No arterial occlusion or significant stenosis. CELIAC TRUNK: No acute finding. No occlusion or significant stenosis. SUPERIOR MESENTERIC ARTERY: Atherosclerotic calcifications involving the superior mesenteric artery and its branches noted without significant stenosis. INFERIOR MESENTERIC ARTERY: No acute finding. No occlusion or significant stenosis. RENAL ARTERIES: The left renal artery is patent. Approximately 30 percent stenosis of the right renal artery due to calcified plaque at the origin. ILIAC ARTERIES: Bilateral iliacs artery calcifications without significant stenosis. CHEST: MEDIASTINUM: Heart size  is normal. No pericardial effusion. Coronary artery atherosclerotic calcifications. Enlarged thyroid gland is again noted with retrosternal extension. Similar to previous study. Increased soft tissue thickening extending along the left prevascular space is noted and may reflect thymic hyperplasia this appears unchanged from 05/15/2024 but has progressed when compared with more remote study from 06/20/2023. LUNGS AND PLEURA: The central airways are patent. Small left pleural effusion, increased from previous exam. Emphysema. Diffuse interlobular septal thickening and ground-glass attenuation is noted throughout both lungs concerning for interstitial edema. Within the left upper lobe there is progressive ground-glass attenuation interstitial thickening and consolidative change concerning for pneumonia. Interval reexpansion of previous left lower lobe atelectasis. No pneumothorax. THORACIC BONES AND SOFT TISSUES: No acute bone or soft tissue abnormality. ABDOMEN AND PELVIS: LIVER: Reflux of contrast material into the IVC and hepatic veins suggests right heart dysfunction. GALLBLADDER AND BILE DUCTS: Gallbladder is unremarkable. No biliary ductal dilatation. SPLEEN: The spleen is within normal limits in size and appearance. PANCREAS: The pancreas is normal in size and contour without focal lesion or ductal dilatation. ADRENAL GLANDS: Unchanged appearance of left adrenal nodule measuring 2.3 cm, image 140/7. This is stable when compared with 07/03/2022 compatible with a likely benign process. KIDNEYS, URETERS AND BLADDER: No stones in the kidneys or ureters. No hydronephrosis. No perinephric or periureteral stranding. Urinary bladder is unremarkable. GI AND BOWEL: Stomach and duodenal sweep demonstrate no acute abnormality. No pathologic dilatation of the large or small bowel loops. No bowel inflammation. Colonic diverticula noted without signs of acute diverticulitis. There is no bowel obstruction. No abnormal bowel  wall thickening or distension. REPRODUCTIVE: The uterus and adnexal structures are unremarkable. PERITONEUM AND RETROPERITONEUM: No significant free fluid or fluid collections are noted. No signs of pneumoperitoneum. LYMPH NODES: Enlarging subcarinal carina lymph node now measures 1.5 cm versus 1.0 cm previously. Enlarging right paratracheal lymph node measures 1.6 cm versus 1.0 cm previously. Prominent bilateral inguinal lymph nodes are identified including 1 cm left inguinal node, image 262/2. The left external iliac lymph node has increased in size measuring 1.2 cm, image 33.86. previously 0.9 cm. ABDOMINAL BONES AND SOFT TISSUES: Degenerative changes are noted within both hips. Small fat-containing umbilical hernia. No acute soft tissue abnormality. IMPRESSION: 1. No evidence of acute aortic syndrome. 2. Progressive left upper lobe pneumonia. 3. Diffuse pulmonary edema and small left pleural effusion. Reflux of contrast material into the IVC and hepatic veins consistent with right heart failure. 4. No central pulmonary arterial  occlusion. 5. Enlarging mediastinal lymph nodes are nonspecific in the setting of chf and pneumonia. 6. 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 Consider follow-up imaging with repeat CT of the chest in 1 to 3 months to ensure stability and/or resolution. 7. Aortic atherosclerotic calcifications and coronary artery calcifications. 8. Progressive increased size of bilateral inguinal and left external iliac lymph nodes compared with 05/17/2024. Nonspecific but favor reactive changes. Electronically signed by: Waddell Calk MD 05/22/2024 10:27 AM EST RP Workstation: [id number]   DG Chest Port 1 View Result Date: 05/22/2024 EXAM: 1 VIEW(S) XRAY OF THE CHEST 05/22/2024 07:39:00 AM COMPARISON: 05/15/2024 CLINICAL HISTORY: chest pain FINDINGS: LUNGS AND PLEURA: Interval increase in airspace opacity within left mid lung  zone. No pleural effusion. No pneumothorax. HEART AND MEDIASTINUM: Atherosclerotic plaque noted. No acute abnormality of the cardiac and mediastinal silhouettes. BONES AND SOFT TISSUES: No acute osseous abnormality. IMPRESSION: 1. Interval increase in airspace opacity within the left mid lung zone. Electronically signed by: Evalene Coho MD 05/22/2024 07:58 AM EST RP Workstation: [id number]     Signature  -   Lavada Stank M.D on 05/23/2024 at 8:33 AM   -  To page go to [email or web address]

## 2024-05-23 NOTE — Progress Notes (Signed)
 PHARMACY - ANTICOAGULATION CONSULT NOTE  Pharmacy Consult for heparin  Indication: chest pain/ACS  Allergies  Allergen Reactions   Asa [Aspirin ] Diarrhea, Nausea And Vomiting and Other (See Comments)    Severe stomach cramps Diarrhea (sometimes with blood)   Latex Itching   Other Rash    Cotton fabric - rash    Patient Measurements: Height: 5' (152.4 cm) Weight: 49.8 kg (109 lb 12.6 oz) IBW/kg (Calculated) : 45.5 HEPARIN  DW (KG): 45.5  Vital Signs: Temp: 97.6 F (36.4 C) (12/07 0758) Temp Source: Oral (12/07 0758) BP: 122/57 (12/07 0758) Pulse Rate: 86 (12/07 0758)  Labs: Recent Labs    05/22/24 0731 05/22/24 0732 05/22/24 1304 05/22/24 1604 05/23/24 0749  HGB 9.1*  --  8.8*  --   --   HCT 28.6*  --  26.6*  --   --   PLT 284  --  265  --   --   APTT  --  39*  --   --   --   LABPROT  --  17.0*  --   --   --   INR  --  1.3*  --   --   --   HEPARINUNFRC  --   --   --   --  0.28*  CREATININE  --  1.54*  --   --   --   TROPONINIHS  --   --  1,135* 2,686*  --     Estimated Creatinine Clearance: 25.8 mL/min (A) (by C-G formula based on SCr of 1.54 mg/dL (H)).   Medical History: Past Medical History:  Diagnosis Date   Anxiety    Blood transfusion without reported diagnosis    CAD in native artery residual post LAD stent, 100% RCA and 80% LCx.  03/30/2022   CHF (congestive heart failure) (HCC)    CKD (chronic kidney disease) stage 4, GFR 15-29 ml/min (HCC)    Cocaine abuse (HCC)    Essential hypertension    HLD (hyperlipidemia) 03/30/2022   Lupus    On mechanically assisted ventilation (HCC) extubated 03/28/22  03/28/2022   Polysubstance abuse (HCC)    Positive ANA (antinuclear antibody)    S/P angioplasty with stent to mLAD 03/27/22 03/30/2022    Medications:  Medications Prior to Admission  Medication Sig Dispense Refill Last Dose/Taking   adalimumab (HUMIRA) 40 MG/0.8ML AJKT pen Inject 40 mg into the skin every 14 (fourteen) days.      amLODipine  (NORVASC) 2.5 MG tablet Take 2.5 mg by mouth daily.      amoxicillin -clavulanate (AUGMENTIN ) 875-125 MG tablet Take 1 tablet by mouth 2 (two) times daily. 10 tablet 0    atorvastatin  (LIPITOR ) 40 MG tablet Take 40 mg by mouth at bedtime.      calcium  carbonate (OS-CAL - DOSED IN MG OF ELEMENTAL CALCIUM ) 1250 (500 Ca) MG tablet Take 1 tablet (1,250 mg total) by mouth daily with breakfast. 30 tablet 0    carvedilol  (COREG ) 6.25 MG tablet Take 1 tablet by mouth 2 (two) times daily with a meal.      dextromethorphan -guaiFENesin  (MUCINEX  DM) 30-600 MG 12hr tablet Take 1 tablet by mouth 2 (two) times daily. 10 tablet 0    GOODSENSE PAIN RELIEF EXTRA ST 500 MG tablet Take 500 mg by mouth every 8 (eight) hours as needed for mild pain (pain score 1-3).      hydrALAZINE  (APRESOLINE ) 25 MG tablet Take 1 tablet by mouth 3 (three) times daily.      hydroxychloroquine (PLAQUENIL)  200 MG tablet Take by mouth.      hydrOXYzine (ATARAX) 50 MG tablet Take 50 mg by mouth 3 (three) times daily.      isosorbide  mononitrate (IMDUR ) 30 MG 24 hr tablet Take 1 tablet (30 mg total) by mouth daily. 30 tablet 1    loperamide  (IMODIUM ) 2 MG capsule Take 1 capsule (2 mg total) by mouth as needed for diarrhea or loose stools. 30 capsule 0    mirtazapine  (REMERON ) 7.5 MG tablet Take 7.5 mg by mouth at bedtime.      sertraline  (ZOLOFT ) 100 MG tablet Take 100 mg by mouth daily.      ticagrelor  (BRILINTA ) 90 MG TABS tablet Take 1 tablet (90 mg total) by mouth 2 (two) times daily. 30 tablet 1    Scheduled:   atorvastatin   40 mg Oral Daily   [START ON 05/24/2024] azithromycin   500 mg Oral Daily   doxycycline   100 mg Oral Q12H   Gerhardt's butt cream   Topical BID   guaiFENesin   600 mg Oral BID   nitroGLYCERIN   0.5 inch Topical Q6H   pantoprazole   40 mg Oral BID AC   ranolazine   500 mg Oral BID   sertraline   100 mg Oral Daily   sodium chloride  flush  3 mL Intravenous Q12H   ticagrelor   90 mg Oral BID   Infusions:   heparin   650 Units/hr (05/22/24 1820)    Assessment: Pt admitted for CP and elevated troponin. Heparin  ordered for ACS.  Heparin  level is subtherapeutic this morning at 0.28. No issues with infusion or bleeding reported per RN.  Goal of Therapy:  Heparin  level 0.3-0.7 units/ml Monitor platelets by anticoagulation protocol: Yes   Plan:  Increase Heparin  infusion to 750 units/hr Check 8 hr HL Daily HL and CBC   Thank you for allowing pharmacy to be a part of this patient's care.   Bascom JAYSON Louder, PharmD 05/23/2024 9:43 AM  **Pharmacist phone directory can be found on [email or web address] listed under [facility]**

## 2024-05-24 ENCOUNTER — Inpatient Hospital Stay (HOSPITAL_COMMUNITY)

## 2024-05-24 DIAGNOSIS — R079 Chest pain, unspecified: Secondary | ICD-10-CM | POA: Diagnosis not present

## 2024-05-24 DIAGNOSIS — I2510 Atherosclerotic heart disease of native coronary artery without angina pectoris: Secondary | ICD-10-CM | POA: Diagnosis not present

## 2024-05-24 DIAGNOSIS — I252 Old myocardial infarction: Secondary | ICD-10-CM | POA: Diagnosis not present

## 2024-05-24 LAB — CBC WITH DIFFERENTIAL/PLATELET
Basophils Absolute: 0 K/uL (ref 0.0–0.1)
Basophils Relative: 0 %
Eosinophils Absolute: 0.3 K/uL (ref 0.0–0.5)
Eosinophils Relative: 2 %
HCT: 25.6 % — ABNORMAL LOW (ref 36.0–46.0)
Hemoglobin: 8.4 g/dL — ABNORMAL LOW (ref 12.0–15.0)
Lymphocytes Relative: 5 %
Lymphs Abs: 0.9 K/uL (ref 0.7–4.0)
MCH: 30.1 pg (ref 26.0–34.0)
MCHC: 32.8 g/dL (ref 30.0–36.0)
MCV: 91.8 fL (ref 80.0–100.0)
Monocytes Absolute: 1.2 K/uL — ABNORMAL HIGH (ref 0.1–1.0)
Monocytes Relative: 7 %
Neutro Abs: 15 K/uL — ABNORMAL HIGH (ref 1.7–7.7)
Neutrophils Relative %: 86 %
Platelets: 367 K/uL (ref 150–400)
RBC: 2.79 MIL/uL — ABNORMAL LOW (ref 3.87–5.11)
RDW: 14.5 % (ref 11.5–15.5)
WBC: 17.4 K/uL — ABNORMAL HIGH (ref 4.0–10.5)
nRBC: 2.1 % — ABNORMAL HIGH (ref 0.0–0.2)

## 2024-05-24 LAB — MAGNESIUM: Magnesium: 2.1 mg/dL (ref 1.7–2.4)

## 2024-05-24 LAB — VITAMIN B12: Vitamin B-12: 441 pg/mL (ref 180–914)

## 2024-05-24 LAB — BASIC METABOLIC PANEL WITH GFR
Anion gap: 11 (ref 5–15)
BUN: 26 mg/dL — ABNORMAL HIGH (ref 8–23)
CO2: 15 mmol/L — ABNORMAL LOW (ref 22–32)
Calcium: 7.9 mg/dL — ABNORMAL LOW (ref 8.9–10.3)
Chloride: 113 mmol/L — ABNORMAL HIGH (ref 98–111)
Creatinine, Ser: 1.67 mg/dL — ABNORMAL HIGH (ref 0.44–1.00)
GFR, Estimated: 34 mL/min — ABNORMAL LOW (ref >60)
Glucose, Bld: 108 mg/dL — ABNORMAL HIGH (ref 70–99)
Potassium: 4.2 mmol/L (ref 3.5–5.1)
Sodium: 139 mmol/L (ref 135–145)

## 2024-05-24 LAB — PROCALCITONIN: Procalcitonin: 0.15 ng/mL

## 2024-05-24 LAB — RETICULOCYTES
Immature Retic Fract: 37 % — ABNORMAL HIGH (ref 2.3–15.9)
RBC.: 3.01 MIL/uL — ABNORMAL LOW (ref 3.87–5.11)
Retic Count, Absolute: 68.6 K/uL (ref 19.0–186.0)
Retic Ct Pct: 2.3 % (ref 0.4–3.1)

## 2024-05-24 LAB — HEPARIN LEVEL (UNFRACTIONATED)
Heparin Unfractionated: 0.29 [IU]/mL — ABNORMAL LOW (ref 0.30–0.70)
Heparin Unfractionated: 0.38 [IU]/mL (ref 0.30–0.70)

## 2024-05-24 LAB — FERRITIN: Ferritin: 182 ng/mL (ref 11–307)

## 2024-05-24 LAB — IRON AND TIBC
Iron: 26 ug/dL — ABNORMAL LOW (ref 28–170)
Saturation Ratios: 13 % (ref 10.4–31.8)
TIBC: 206 ug/dL — ABNORMAL LOW (ref 250–450)
UIBC: 180 ug/dL

## 2024-05-24 LAB — FOLATE: Folate: 11.3 ng/mL (ref >5.9)

## 2024-05-24 LAB — BRAIN NATRIURETIC PEPTIDE: B Natriuretic Peptide: 2005.7 pg/mL — ABNORMAL HIGH (ref 0.0–100.0)

## 2024-05-24 LAB — C-REACTIVE PROTEIN: CRP: 10.8 mg/dL — ABNORMAL HIGH (ref <1.0)

## 2024-05-24 MED ORDER — FUROSEMIDE 10 MG/ML IJ SOLN
20.0000 mg | Freq: Once | INTRAMUSCULAR | Status: AC
  Administered 2024-05-24: 20 mg via INTRAVENOUS
  Filled 2024-05-24: qty 2

## 2024-05-24 MED ORDER — FUROSEMIDE 10 MG/ML IJ SOLN
20.0000 mg | Freq: Two times a day (BID) | INTRAMUSCULAR | Status: DC
  Administered 2024-05-24: 20 mg via INTRAVENOUS
  Filled 2024-05-24: qty 2

## 2024-05-24 MED ORDER — ENSURE PLUS HIGH PROTEIN PO LIQD
237.0000 mL | Freq: Two times a day (BID) | ORAL | Status: DC
  Administered 2024-05-25 – 2024-06-02 (×11): 237 mL via ORAL

## 2024-05-24 NOTE — Plan of Care (Signed)
  Problem: Health Behavior/Discharge Planning: Goal: Ability to manage health-related needs will improve Outcome: Progressing   Problem: Clinical Measurements: Goal: Will remain free from infection Outcome: Progressing Goal: Diagnostic test results will improve Outcome: Progressing Goal: Respiratory complications will improve Outcome: Progressing   Problem: Nutrition: Goal: Adequate nutrition will be maintained Outcome: Progressing   Problem: Coping: Goal: Level of anxiety will decrease Outcome: Progressing   Problem: Elimination: Goal: Will not experience complications related to bowel motility Outcome: Progressing Goal: Will not experience complications related to urinary retention Outcome: Progressing   Problem: Pain Managment: Goal: General experience of comfort will improve and/or be controlled Outcome: Progressing

## 2024-05-24 NOTE — Progress Notes (Signed)
 PROGRESS NOTE     Patient Demographics:    Cassidy Larson, is a 68 y.o. female, DOB - May 12, 1956, FMW:[id number]  Outpatient Primary MD for the patient is [name], FNP    LOS - 2  Admit date - 05/22/2024    Chief Complaint  Patient presents with   Chest Pain       Brief Narrative (HPI from H&P)   68 y.o. 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   She was  just recently been hospitalized from 11/29-12/4 with concern for sepsis thought possibly secondary to a left-sided bacterial community-acquired pneumonia.  Blood cultures were negative and patient was treated with IV cefepime  and transition to oral Augmentin  to complete a 5-day course at discharge. Records note urine Legionella cultures were positive from admission the last admission.   In the ER her workup was consistent with NSTEMI, CHF with acute hypoxic respiratory failure, possible worsening pneumonia and she was admitted to the hospital for    Subjective:   Patient in bed denies any headache, chest pain has improved, shortness of breath is there but mild, no abdominal pain no focal weakness   Assessment  & Plan :   Acute respiratory failure with hypoxia with sepsis, community-acquired pneumonia, recent Legionella pneumonia in the setting of NSTEMI and acute on chronic diastolic CHF.  Patient was recently diagnosed with Legionella pneumonia, CT scan shows worsening pulmonary edema and pneumonia, will be placed on azithromycin  along with Augmentin , azithromycin   total 7 days, Augmentin  x 5, she is already on heparin  drip for NSTEMI, Brilinta  and statin, she is allergic to aspirin .  Diurese as tolerated by blood pressure and kidneys, cardiology has seen the patient, patient prefers general medical treatment if declines full comfort measures.  She is DNR and not a candidate for aggressive invasive procedures or treatment modalities.  Continue present care, supplemental oxygen, I-S and flutter valve for pulmonary toiletry and monitor.  Overall prognosis is guarded.  NSTEMI with acute on chronic diastolic congestive heart failure exacerbation Suspected acute on chronic.  CTA of the chest noted concern for pulmonary edema with signs of right heart failure.  Last echocardiogram noted EF to be around 50-55% with apical hypokinesis back in 2023.  Heparin  drip, Brilinta , statin, Lasix  as tolerated, supplemental oxygen and monitor, echocardiogram noted and reassuring, cardiology following.  Low-dose nitro paste and Ranexa  for chest pain, trial of Maalox.  Chest pain much improved, repeat low-dose Lasix  as still has some crackles, continue to monitor soft blood pressures.      Hypotension Supportive care   Normocytic anemia Chronic hemoglobin noted to be 9.1 which appears similar to when he was recently discharged from the hospital. - Continue to monitor, agreeable for transfusion if needed   Chronic kidney disease stage IIIb Creatinine noted to be 1.54 with BUN 22. At risk for AKI.  Due to hypotension, NSTEMI, monitor.   CAD s/p PCI Remote history of stenting of the mid LAD - Continue Brilinta , Lipitor    Anemia of chronic disease.  Check anemia panel, type screen and monitor agreeable for transfusion if needed   prolonged QT interval Chronic.  QTc 512 - Monitor electrolytes, minimize QT prolonging medications as much as practically possible, due to recent Legionella pneumonia and worsening pneumonia on CT despite treatment before azithromycin  x 7 days.    Systemic lupus Review of records note patient with a history of positive ANA back in 2019.  Previously had been on Plaquenil   Mixed hyperlipidemia - Continue Lipitor    Dysthymic disorder - Continue Zoloft  but held mirtazapine    Elevated liver enzymes Acute.  AST 47 and ALT 52.  Possibly secondary to passive congestion from patient being fluid overload. - Continue to monitor   Tobacco abuse Patient just recently stopped smoking in the last week. - Continue to encourage cessation of tobacco use  Incidental finding on CT.  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 Consider follow-up imaging with repeat CT of the chest in 1 to 3 months to ensure stability and/or resolution.  PCP to monitor.      Condition - Extremely Guarded  Family Communication  : No family, legal guardian listed as Mrs. Ascencion [phone], she was called on 05/23/2024 at 8:30 AM, she informs me that she is not a legal guardian or POA she is simply a production designer, theatre/television/film of the facility where patient lives, she confirms patient has no family and decides for herself.  Code Status : DNR  Consults  : Cardiology, palliative care  PUD Prophylaxis : PPI.   Procedures  :     CT - 1. No evidence of acute aortic syndrome. 2. Progressive left upper lobe pneumonia. 3. Diffuse pulmonary edema and small left pleural effusion. Reflux of contrast material into the IVC and hepatic veins consistent with right heart failure. 4. No central pulmonary arterial occlusion. 5. Enlarging mediastinal lymph nodes are nonspecific in the setting of chf and pneumonia. 6. 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 Consider follow-up imaging with repeat CT of the chest in 1 to 3 months to ensure stability and/or resolution. 7. Aortic atherosclerotic calcifications and coronary artery calcifications. 8. Progressive increased size of bilateral inguinal and  left external iliac lymph nodes compared with 05/17/2024. Nonspecific but favor reactive changes.      Disposition Plan  :    Status is: Inpatient  DVT Prophylaxis  :  Hep gtt   Lab  Results  Component Value Date   PLT 367 05/24/2024    Diet :  Diet Order             DIET DYS 3 Room service appropriate? Yes; Fluid consistency: Thin  Diet effective now                    Inpatient Medications  Scheduled Meds:  atorvastatin   40 mg Oral Daily   azithromycin   500 mg Oral Daily   doxycycline   100 mg Oral Q12H   furosemide   20 mg Intravenous Once   Gerhardt's butt cream   Topical BID   guaiFENesin   600 mg Oral BID   nitroGLYCERIN   0.5 inch Topical Q6H   pantoprazole   40 mg Oral BID AC   ranolazine   500 mg Oral BID   sertraline   100 mg Oral Daily   sodium chloride  flush  3 mL Intravenous Q12H   ticagrelor   90 mg Oral BID   Continuous Infusions:  heparin  750 Units/hr (05/23/24 2317)   PRN Meds:.acetaminophen  **OR** acetaminophen , albuterol , morphine  injection, trimethobenzamide     Objective:   Vitals:   05/24/24 0400 05/24/24 0450 05/24/24 0500 05/24/24 0752  BP: 110/73 105/70  105/70  Pulse: 93 81  84  Resp: (!) 27 (!) 24  (!) 24  Temp:  97.7 F (36.5 C)  97.8 F (36.6 C)  TempSrc:  Oral  Oral  SpO2: 93% 92%  93%  Weight:   46.8 kg   Height:        Wt Readings from Last 3 Encounters:  05/24/24 46.8 kg  05/15/24 45.2 kg  10/20/22 43.5 kg     Intake/Output Summary (Last 24 hours) at 05/24/2024 0946 Last data filed at 05/24/2024 0500 Gross per 24 hour  Intake 3 ml  Output 400 ml  Net -397 ml     Physical Exam  Awake Alert, No new F.N deficits, Normal affect [place].AT,PERRAL Supple Neck, No JVD,   Symmetrical Chest wall movement, Good air movement bilaterally, few rales RRR,No Gallops,Rubs or new Murmurs,  +ve B.Sounds, Abd Soft, No tenderness,   No Cyanosis, Clubbing or edema       Data Review:    Recent Labs  Lab 05/18/24 0458  05/19/24 0429 05/22/24 0731 05/22/24 1304 05/24/24 0739  WBC 10.1 8.5 10.7* 10.8* 17.4*  HGB 9.1* 9.3* 9.1* 8.8* 8.4*  HCT 27.6* 28.7* 28.6* 26.6* 25.6*  PLT 149* 158 284 265 367  MCV 93.9 93.2 93.8 92.7 91.8  MCH 31.0 30.2 29.8 30.7 30.1  MCHC 33.0 32.4 31.8 33.1 32.8  RDW 13.5 13.4 13.8 13.8 14.5  LYMPHSABS 1.1 1.2 1.6 1.1 0.9  MONOABS 1.6* 1.8* 1.1* 1.0 1.2*  EOSABS 0.0 0.1 0.5 0.2 0.3  BASOSABS 0.0 0.0 0.0 0.0 0.0    Recent Labs  Lab 05/18/24 0458 05/19/24 0429 05/22/24 0732 05/22/24 1104 05/22/24 1304 05/23/24 0749 05/24/24 0739  NA 139 141 140  --   --  141 139  K 3.4* 3.5 3.7  --   --  4.1 4.2  CL 107 110 110  --   --  118* 113*  CO2 20* 21* 17*  --   --  14* 15*  ANIONGAP 12 10 13   --   --  9 11  GLUCOSE 66* 116* 168*  --   --  120* 108*  BUN 15 14 22   --   --  21 26*  CREATININE 1.35* 1.38* 1.54*  --   --  1.51* 1.67*  AST  --   --  47*  --   --   --   --   ALT  --   --  52*  --   --   --   --   ALKPHOS  --   --  105  --   --   --   --   BILITOT  --   --  0.3  --   --   --   --   ALBUMIN  --   --  3.2*  --   --   --   --   CRP  --   --   --   --   --  10.2* 10.8*  PROCALCITON  --   --   --   --  0.17 0.22 0.15  LATICACIDVEN  --   --   --   --  1.0  --   --   INR  --   --  1.3*  --   --   --   --   HGBA1C  --   --  5.8*  --   --   --   --   BNP  --   --   --  801.9*  --  1,974.0* 2,005.7*  MG  --   --   --   --   --  2.3 2.1  CALCIUM  7.9* 8.1* 8.3*  --   --  7.8* 7.9*      Recent Labs  Lab 05/18/24 0458 05/19/24 0429 05/22/24 0732 05/22/24 1104 05/22/24 1304 05/23/24 0749 05/24/24 0739  CRP  --   --   --   --   --  10.2* 10.8*  PROCALCITON  --   --   --   --  0.17 0.22 0.15  LATICACIDVEN  --   --   --   --  1.0  --   --   INR  --   --  1.3*  --   --   --   --   HGBA1C  --   --  5.8*  --   --   --   --   BNP  --   --   --  801.9*  --  1,974.0* 2,005.7*  MG  --   --   --   --   --  2.3 2.1  CALCIUM  7.9* 8.1* 8.3*  --   --  7.8* 7.9*     --------------------------------------------------------------------------------------------------------------- Lab Results  Component Value Date   CHOL 95 05/22/2024   HDL 18 (L) 05/22/2024   LDLCALC 43 05/22/2024   TRIG 173 (H) 05/22/2024   CHOLHDL 5.3 05/22/2024    Lab Results  Component Value Date   HGBA1C 5.8 (H) 05/22/2024   No results for input(s): TSH, T4TOTAL, FREET4, T3FREE, THYROIDAB in the last 72 hours. No results for input(s): VITAMINB12, FOLATE, FERRITIN, TIBC, IRON, RETICCTPCT in the last 72 hours. ------------------------------------------------------------------------------------------------------------------ Cardiac Enzymes No results for input(s): CKMB, TROPONINI, MYOGLOBIN in the last 168 hours.  Invalid input(s): CK  Micro Results Recent Results (from the past 240 hours)  Blood Culture (routine x 2)     Status: None   Collection Time: 05/15/24  8:24 PM   Specimen: BLOOD LEFT WRIST  Result Value Ref Range Status   Specimen Description BLOOD LEFT WRIST  Final   Special Requests   Final    BOTTLES DRAWN AEROBIC AND ANAEROBIC Blood Culture adequate volume  Culture   Final    NO GROWTH 5 DAYS Performed at [facility], [address], [place], [place] [address]    Report Status 05/20/2024 FINAL  Final  Resp panel by RT-PCR (RSV, Flu A&B, Covid) Anterior Nasal Swab     Status: None   Collection Time: 05/15/24  8:38 PM   Specimen: Anterior Nasal Swab  Result Value Ref Range Status   SARS Coronavirus 2 by RT PCR NEGATIVE NEGATIVE Final    Comment: (NOTE) SARS-CoV-2 target nucleic acids are NOT DETECTED.  The SARS-CoV-2 RNA is generally detectable in upper respiratory specimens during the acute phase of infection. The lowest concentration of SARS-CoV-2 viral copies this assay can detect is 138 copies/mL. A negative result does not preclude SARS-Cov-2 infection and should not be used as the sole basis for treatment  or other patient management decisions. A negative result may occur with  improper specimen collection/handling, submission of specimen other than nasopharyngeal swab, presence of viral mutation(s) within the areas targeted by this assay, and inadequate number of viral copies(<138 copies/mL). A negative result must be combined with clinical observations, patient history, and epidemiological information. The expected result is Negative.  Fact Sheet for Patients:  bloggercourse.com  Fact Sheet for Healthcare Providers:  seriousbroker.it  This test is no t yet approved or cleared by the [place]  FDA and  has been authorized for detection and/or diagnosis of SARS-CoV-2 by FDA under an Emergency Use Authorization (EUA). This EUA will remain  in effect (meaning this test can be used) for the duration of the COVID-19 declaration under Section 564(b)(1) of the Act, 21 U.S.C.section 360bbb-3(b)(1), unless the authorization is terminated  or revoked sooner.       Influenza A by PCR NEGATIVE NEGATIVE Final   Influenza B by PCR NEGATIVE NEGATIVE Final    Comment: (NOTE) The Xpert Xpress SARS-CoV-2/FLU/RSV plus assay is intended as an aid in the diagnosis of influenza from Nasopharyngeal swab specimens and should not be used as a sole basis for treatment. Nasal washings and aspirates are unacceptable for Xpert Xpress SARS-CoV-2/FLU/RSV testing.  Fact Sheet for Patients: bloggercourse.com  Fact Sheet for Healthcare Providers: seriousbroker.it  This test is not yet approved or cleared by the [place]  FDA and has been authorized for detection and/or diagnosis of SARS-CoV-2 by FDA under an Emergency Use Authorization (EUA). This EUA will remain in effect (meaning this test can be used) for the duration of the COVID-19 declaration under Section 564(b)(1) of the Act, 21 U.S.C. section  360bbb-3(b)(1), unless the authorization is terminated or revoked.     Resp Syncytial Virus by PCR NEGATIVE NEGATIVE Final    Comment: (NOTE) Fact Sheet for Patients: bloggercourse.com  Fact Sheet for Healthcare Providers: seriousbroker.it  This test is not yet approved or cleared by the [place]  FDA and has been authorized for detection and/or diagnosis of SARS-CoV-2 by FDA under an Emergency Use Authorization (EUA). This EUA will remain in effect (meaning this test can be used) for the duration of the COVID-19 declaration under Section 564(b)(1) of the Act, 21 U.S.C. section 360bbb-3(b)(1), unless the authorization is terminated or revoked.  Performed at [facility], [address] [place], [place] [address]   Blood Culture (routine x 2)     Status: None   Collection Time: 05/15/24  8:38 PM   Specimen: Right Antecubital; Blood  Result Value Ref Range Status   Specimen Description RIGHT ANTECUBITAL  Final   Special Requests   Final  BOTTLES DRAWN AEROBIC ONLY Blood Culture adequate volume   Culture   Final    NO GROWTH 5 DAYS Performed at [facility], [address], [place], [place] [address]    Report Status 05/20/2024 FINAL  Final  C Difficile Quick Screen w PCR reflex     Status: None   Collection Time: 05/16/24  3:14 AM   Specimen: STOOL  Result Value Ref Range Status   C Diff antigen NEGATIVE NEGATIVE Final   C Diff toxin NEGATIVE NEGATIVE Final   C Diff interpretation No C. difficile detected.  Final    Comment: Performed at [facility], [address], [place], [place] [address]  Gastrointestinal Panel by PCR , Stool     Status: None   Collection Time: 05/16/24  2:40 PM   Specimen: Stool  Result Value Ref Range Status   Campylobacter species NOT DETECTED NOT DETECTED Final   Plesimonas shigelloides NOT DETECTED NOT DETECTED Final   Salmonella species NOT DETECTED NOT DETECTED Final   Yersinia  enterocolitica NOT DETECTED NOT DETECTED Final   Vibrio species NOT DETECTED NOT DETECTED Final   Vibrio cholerae NOT DETECTED NOT DETECTED Final   Enteroaggregative E coli (EAEC) NOT DETECTED NOT DETECTED Final   Enteropathogenic E coli (EPEC) NOT DETECTED NOT DETECTED Final   Enterotoxigenic E coli (ETEC) NOT DETECTED NOT DETECTED Final   Shiga like toxin producing E coli (STEC) NOT DETECTED NOT DETECTED Final   Shigella/Enteroinvasive E coli (EIEC) NOT DETECTED NOT DETECTED Final   Cryptosporidium NOT DETECTED NOT DETECTED Final   Cyclospora cayetanensis NOT DETECTED NOT DETECTED Final   Entamoeba histolytica NOT DETECTED NOT DETECTED Final   Giardia lamblia NOT DETECTED NOT DETECTED Final   Adenovirus F40/41 NOT DETECTED NOT DETECTED Final   Astrovirus NOT DETECTED NOT DETECTED Final   Norovirus GI/GII NOT DETECTED NOT DETECTED Final   Rotavirus A NOT DETECTED NOT DETECTED Final   Sapovirus (I, II, IV, and V) NOT DETECTED NOT DETECTED Final    Comment: Performed at [facility], [address] [place], [place] [address]  MRSA Next Gen by PCR, Nasal     Status: None   Collection Time: 05/22/24  7:03 AM  Result Value Ref Range Status   MRSA by PCR Next Gen NOT DETECTED NOT DETECTED Final    Comment: (NOTE) The GeneXpert MRSA Assay (FDA approved for NASAL specimens only), is one component of a comprehensive MRSA colonization surveillance program. It is not intended to diagnose MRSA infection nor to guide or monitor treatment for MRSA infections. Test performance is not FDA approved in patients less than 69 years old. Performed at [facility], [address], [place], [place] [address]   Blood culture (routine x 2)     Status: None (Preliminary result)   Collection Time: 05/22/24 10:55 AM   Specimen: BLOOD RIGHT ARM  Result Value Ref Range Status   Specimen Description BLOOD RIGHT ARM  Final   Special Requests   Final    BOTTLES DRAWN AEROBIC AND ANAEROBIC Blood  Culture results may not be optimal due to an inadequate volume of blood received in culture bottles   Culture   Final    NO GROWTH 2 DAYS Performed at [facility], [address] [place], [place] [address]    Report Status PENDING  Incomplete  Blood culture (routine x 2)     Status: None (Preliminary result)   Collection Time: 05/22/24 11:00 AM   Specimen:  BLOOD LEFT ARM  Result Value Ref Range Status   Specimen Description BLOOD LEFT ARM  Final   Special Requests   Final    BOTTLES DRAWN AEROBIC AND ANAEROBIC Blood Culture results may not be optimal due to an inadequate volume of blood received in culture bottles   Culture   Final    NO GROWTH 2 DAYS Performed at [facility], [address], [place], [place] [address]    Report Status PENDING  Incomplete    Radiology Report ECHOCARDIOGRAM COMPLETE Result Date: 05/23/2024    ECHOCARDIOGRAM REPORT   Patient Name:   TIENA Cajas Date of Exam: 05/23/2024 Medical Rec #:  [id number]        Height:       60.0 in Accession #:    [id number]       Weight:       109.8 lb Date of Birth:  11/15/1955        BSA:          1.447 m Patient Age:    68 years         BP:           101/73 mmHg Patient Gender: F                HR:           81 bpm. Exam Location:  Inpatient Procedure: 2D Echo, Cardiac Doppler and Color Doppler (Both Spectral and Color            Flow Doppler were utilized during procedure). Indications:    CHF-Acute Systolic I50.21  History:        Patient has prior history of Echocardiogram examinations, most                 recent 04/22/2022. CHF, CAD; Risk Factors:Hypertension.  Sonographer:    Jayson Gaskins Referring Phys: [id number] RONDELL A SMITH IMPRESSIONS  1. Left ventricular ejection fraction, by estimation, is 60 to 65%. The left ventricle has normal function. The left ventricle has no regional wall motion abnormalities. Left ventricular diastolic parameters are indeterminate.  2. Right ventricular systolic function is  normal. The right ventricular size is normal.  3. Shadowing artifact in LA from MAC. Restricted posterior leaflet motion. Consider f/u TEE to further evaluate the degree of MR and MV morphology. The mitral valve is abnormal. Severe mitral valve regurgitation. No evidence of mitral stenosis. Severe mitral annular calcification.  4. Tricuspid valve regurgitation is mild to moderate.  5. The aortic valve is normal in structure. Aortic valve regurgitation is not visualized. No aortic stenosis is present.  6. The inferior vena cava is normal in size with greater than 50% respiratory variability, suggesting right atrial pressure of 3 mmHg. FINDINGS  Left Ventricle: Left ventricular ejection fraction, by estimation, is 60 to 65%. The left ventricle has normal function. The left ventricle has no regional wall motion abnormalities. Strain was performed and the global longitudinal strain is indeterminate. The left ventricular internal cavity size was normal in size. There is no left ventricular hypertrophy. Left ventricular diastolic parameters are indeterminate. Right Ventricle: The right ventricular size is normal. No increase in right ventricular wall thickness. Right ventricular systolic function is normal. Left Atrium: Left atrial size was normal in size. Right Atrium: Right atrial size was normal in size. Pericardium: There is no evidence of pericardial effusion. Mitral Valve: Shadowing artifact in LA from MAC. Restricted posterior leaflet motion. Consider f/u TEE to further evaluate the  degree of MR and MV morphology. The mitral valve is abnormal. There is moderate thickening of the mitral valve leaflet(s). There is moderate calcification of the mitral valve leaflet(s). Severe mitral annular calcification. Severe mitral valve regurgitation. No evidence of mitral valve stenosis. Tricuspid Valve: The tricuspid valve is normal in structure. Tricuspid valve regurgitation is mild to moderate. No evidence of tricuspid  stenosis. Aortic Valve: The aortic valve is normal in structure. Aortic valve regurgitation is not visualized. No aortic stenosis is present. Aortic valve mean gradient measures 2.0 mmHg. Aortic valve peak gradient measures 4.2 mmHg. Aortic valve area, by VTI measures 2.34 cm. Pulmonic Valve: The pulmonic valve was normal in structure. Pulmonic valve regurgitation is not visualized. No evidence of pulmonic stenosis. Aorta: The aortic root is normal in size and structure. Venous: The inferior vena cava is normal in size with greater than 50% respiratory variability, suggesting right atrial pressure of 3 mmHg. IAS/Shunts: No atrial level shunt detected by color flow Doppler. Additional Comments: 3D was performed not requiring image post processing on an independent workstation and was indeterminate.  LEFT VENTRICLE PLAX 2D LVIDd:         3.90 cm   Diastology LVIDs:         2.90 cm   LV e' medial:    8.92 cm/s LV PW:         0.90 cm   LV E/e' medial:  10.6 LV IVS:        0.90 cm   LV e' lateral:   8.05 cm/s LVOT diam:     1.70 cm   LV E/e' lateral: 11.8 LV SV:         37 LV SV Index:   25 LVOT Area:     2.27 cm  RIGHT VENTRICLE            IVC RV S prime:     8.70 cm/s  IVC diam: 1.80 cm TAPSE (M-mode): 2.3 cm LEFT ATRIUM             Index        RIGHT ATRIUM           Index LA Vol (A2C):   30.9 ml 21.36 ml/m  RA Area:     10.10 cm LA Vol (A4C):   25.6 ml 17.70 ml/m  RA Volume:   21.00 ml  14.52 ml/m LA Biplane Vol: 29.0 ml 20.05 ml/m  AORTIC VALVE AV Area (Vmax):    1.78 cm AV Area (Vmean):   1.59 cm AV Area (VTI):     2.34 cm AV Vmax:           102.00 cm/s AV Vmean:          75.400 cm/s AV VTI:            0.156 m AV Peak Grad:      4.2 mmHg AV Mean Grad:      2.0 mmHg LVOT Vmax:         79.80 cm/s LVOT Vmean:        52.700 cm/s LVOT VTI:          0.161 m LVOT/AV VTI ratio: 1.03  AORTA Ao Root diam: 2.30 cm MITRAL VALVE MV Area (PHT): 4.77 cm    SHUNTS MV Decel Time: 159 msec    Systemic VTI:  0.16 m MV E  velocity: 94.90 cm/s  Systemic Diam: 1.70 cm MV A velocity: 48.20 cm/s MV E/A ratio:  1.97 Maude Emmer MD  Electronically signed by Maude Emmer MD Signature Date/Time: 05/23/2024/12:26:43 PM    Final      Signature  -   Lavada Stank M.D on 05/24/2024 at 9:46 AM   -  To page go to [email or web address]

## 2024-05-24 NOTE — Progress Notes (Signed)
 PT Cancellation Note  Patient Details Name: Cassidy Larson MRN: [id number] DOB: [birth year]   Cancelled Treatment:    Reason Eval/Treat Not Completed: Patient at procedure or test/unavailable (CT). Will check back as schedule allows.   Richerd Lipoma, PT  Acute Rehab Services Secure chat preferred Office [phone]    Richerd LITTIE Lipoma 05/24/2024, 11:54 AM

## 2024-05-24 NOTE — Evaluation (Signed)
 Physical Therapy Evaluation Patient Details Name: Cassidy Larson MRN: [id number] DOB: Jan 30, 1956 Today's Date: 05/24/2024  History of Present Illness  Pt is 68 yo female who returns 05/22/24 from group home (after d/c from [facility] 12/4 for sepsis due to PNA) with acute onset of chest pain, L flank pain, and hypotension. Suspected NSTEMI due to PNA.  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 Pt from group home where she reports she has progressively less activity over last month, going from independence with ambulation to walking with SPC or RW to recently not walking at all. Eval very limited today due to pt's pain and refusal to mobilize. Pt presents with UE and LE weakness with WFL ROM. Needed min A for bed mobility but refused to get OOB. Patient will benefit from continued inpatient follow up therapy, <3 hours/day.  Pt currently with functional limitations due to the deficits listed below (see PT Problem List). Pt will benefit from acute skilled PT to increase their independence and safety with mobility to allow discharge.           If plan is discharge home, recommend the following: A lot of help with walking and/or transfers;A lot of help with bathing/dressing/bathroom;Help with stairs or ramp for entrance;Assist for transportation;Assistance with cooking/housework   Can travel by private vehicle   No    Equipment Recommendations None recommended by PT  Recommendations for Other Services       Functional Status Assessment Patient has had a recent decline in their functional status and demonstrates the ability to make significant improvements in function in a reasonable and predictable amount of time.     Precautions / Restrictions Precautions Precautions: Fall Recall of Precautions/Restrictions: Intact Restrictions Weight Bearing Restrictions Per Provider Order: No      Mobility  Bed Mobility Overal bed  mobility: Needs Assistance Bed Mobility: Rolling Rolling: Min assist         General bed mobility comments: min A to roll for improved positioning and to place pillow to change pressure on buttocks.    Transfers                   General transfer comment: pt refused to attempt transfer even to EOB, reports that her RA is flaring. Discussed that if she decides to go through with heart surgery that she will need to work with therapy to get stronger before and to recover after    Ambulation/Gait         Gait velocity: slow     General Gait Details: pt deferred  Stairs            Wheelchair Mobility     Tilt Bed    Modified Rankin (Stroke Patients Only)       Balance                                             Pertinent Vitals/Pain Pain Assessment Pain Assessment: Faces Faces Pain Scale: Hurts worst Pain Location: B LE, back, buttocks Pain Descriptors / Indicators: Discomfort Pain Intervention(s): Limited activity within patient's tolerance, Monitored during session, Repositioned (pillow under R hip)    Home Living Family/patient expects to be discharged to:: Other (Comment) (group home)       Home Access: Level entry  Home Equipment: Cane - single Librarian, Academic (2 wheels) Additional Comments: pt lives in a group home with 5 other women per her report    Prior Function Prior Level of Function : Needs assist       Physical Assist : ADLs (physical)   ADLs (physical): IADLs Mobility Comments: pt reports she has been unable to ambulate since last admission due to weakness ADLs Comments: needs assist     Extremity/Trunk Assessment   Upper Extremity Assessment Upper Extremity Assessment: Generalized weakness    Lower Extremity Assessment Lower Extremity Assessment: Generalized weakness    Cervical / Trunk Assessment Cervical / Trunk Assessment: Normal  Communication    Communication Communication: No apparent difficulties    Cognition Arousal: Alert Behavior During Therapy: WFL for tasks assessed/performed   PT - Cognitive impairments: No family/caregiver present to determine baseline, Memory                       PT - Cognition Comments: noted to be intact last admission but today is very fatigued, has difficulty answering questions and is resistant to treatment Following commands: Impaired Following commands impaired: Follows one step commands with increased time     Cueing Cueing Techniques: Verbal cues, Tactile cues     General Comments General comments (skin integrity, edema, etc.): SPO2 94% on 5L O2    Exercises General Exercises - Upper Extremity Shoulder Flexion: AROM, Both, 5 reps, Supine General Exercises - Lower Extremity Ankle Circles/Pumps: AROM, Both, 10 reps, Supine Heel Slides: AROM, Both, 10 reps, Supine   Assessment/Plan    PT Assessment Patient needs continued PT services  PT Problem List Decreased strength;Decreased activity tolerance;Decreased mobility;Decreased balance;Cardiopulmonary status limiting activity       PT Treatment Interventions DME instruction;Gait training;Functional mobility training;Therapeutic activities;Therapeutic exercise;Balance training;Patient/family education    PT Goals (Current goals can be found in the Care Plan section)  Acute Rehab PT Goals Patient Stated Goal: live through heart surgery PT Goal Formulation: With patient Time For Goal Achievement: 06/07/24 Potential to Achieve Goals: Fair    Frequency Min 1X/week     Co-evaluation               AM-PAC PT 6 Clicks Mobility  Outcome Measure Help needed turning from your back to your side while in a flat bed without using bedrails?: A Lot Help needed moving from lying on your back to sitting on the side of a flat bed without using bedrails?: A Lot Help needed moving to and from a bed to a chair (including a  wheelchair)?: Total Help needed standing up from a chair using your arms (e.g., wheelchair or bedside chair)?: Total Help needed to walk in hospital room?: Total Help needed climbing 3-5 steps with a railing? : Total 6 Click Score: 8    End of Session Equipment Utilized During Treatment: Oxygen Activity Tolerance: Patient limited by fatigue;Patient limited by pain Patient left: in bed;with bed alarm set;with call bell/phone within reach Nurse Communication: Mobility status PT Visit Diagnosis: Unsteadiness on feet (R26.81);Other abnormalities of gait and mobility (R26.89);Muscle weakness (generalized) (M62.81)    Time: 8785-8769 PT Time Calculation (min) (ACUTE ONLY): 16 min   Charges:   PT Evaluation $PT Eval Low Complexity: 1 Low   PT General Charges $$ ACUTE PT VISIT: 1 Visit         Cassidy Larson, PT  Acute Rehab Services Secure chat preferred Office [phone]   Cassidy Larson 05/24/2024, 12:46 PM

## 2024-05-24 NOTE — Progress Notes (Signed)
 ANTICOAGULATION CONSULT NOTE  Pharmacy Consult for Heparin  Indication: chest pain/ACS  Allergies  Allergen Reactions   Asa [Aspirin ] Diarrhea, Nausea And Vomiting and Other (See Comments)    Severe stomach cramps Diarrhea (sometimes with blood)   Latex Itching   Other Rash    Cotton fabric - rash    Patient Measurements: Height: 5' (152.4 cm) Weight: 46.8 kg (103 lb 2.8 oz) IBW/kg (Calculated) : 45.5 Heparin  Dosing Weight: 45.5 kg  Vital Signs: Temp: 97.3 F (36.3 C) (12/08 2006) Temp Source: Oral (12/08 2006) BP: 107/68 (12/08 2006) Pulse Rate: 78 (12/08 1602)  Labs: Recent Labs    05/22/24 0731 05/22/24 0732 05/22/24 1304 05/22/24 1604 05/23/24 0749 05/23/24 0749 05/23/24 1852 05/24/24 0739 05/24/24 2003  HGB 9.1*  --  8.8*  --   --   --   --  8.4*  --   HCT 28.6*  --  26.6*  --   --   --   --  25.6*  --   PLT 284  --  265  --   --   --   --  367  --   APTT  --  39*  --   --   --   --   --   --   --   LABPROT  --  17.0*  --   --   --   --   --   --   --   INR  --  1.3*  --   --   --   --   --   --   --   HEPARINUNFRC  --   --   --   --  0.28*   < > 0.34 0.29* 0.38  CREATININE  --  1.54*  --   --  1.51*  --   --  1.67*  --   TROPONINIHS  --   --  1,135* 2,686*  --   --   --   --   --    < > = values in this interval not displayed.    Estimated Creatinine Clearance: 23.8 mL/min (A) (by C-G formula based on SCr of 1.67 mg/dL (H)).   Medical History: Past Medical History:  Diagnosis Date   Anxiety    Blood transfusion without reported diagnosis    CAD in native artery residual post LAD stent, 100% RCA and 80% LCx.  03/30/2022   CHF (congestive heart failure) (HCC)    CKD (chronic kidney disease) stage 4, GFR 15-29 ml/min (HCC)    Cocaine abuse (HCC)    Essential hypertension    HLD (hyperlipidemia) 03/30/2022   Lupus    On mechanically assisted ventilation (HCC) extubated 03/28/22  03/28/2022   Polysubstance abuse (HCC)    Positive ANA (antinuclear  antibody)    S/P angioplasty with stent to mLAD 03/27/22 03/30/2022    Medications:  Medications Prior to Admission  Medication Sig Dispense Refill Last Dose/Taking   adalimumab (HUMIRA) 40 MG/0.8ML AJKT pen Inject 40 mg into the skin every 14 (fourteen) days.   Unknown   amLODipine (NORVASC) 2.5 MG tablet Take 2.5 mg by mouth daily.   05/21/2024   amoxicillin -clavulanate (AUGMENTIN ) 875-125 MG tablet Take 1 tablet by mouth 2 (two) times daily. 10 tablet 0 05/21/2024   atorvastatin  (LIPITOR ) 40 MG tablet Take 40 mg by mouth at bedtime.   05/21/2024   carvedilol  (COREG ) 6.25 MG tablet Take 1 tablet by mouth 2 (two) times daily with a  meal.   05/21/2024   dextromethorphan -guaiFENesin  (MUCINEX  DM) 30-600 MG 12hr tablet Take 1 tablet by mouth 2 (two) times daily. 10 tablet 0 05/21/2024   GOODSENSE PAIN RELIEF EXTRA ST 500 MG tablet Take 500 mg by mouth every 8 (eight) hours as needed for mild pain (pain score 1-3).   Unknown   hydrALAZINE  (APRESOLINE ) 25 MG tablet Take 1 tablet by mouth 3 (three) times daily.   05/21/2024   hydrOXYzine (ATARAX) 50 MG tablet Take 50 mg by mouth 3 (three) times daily.   05/21/2024   isosorbide  mononitrate (IMDUR ) 30 MG 24 hr tablet Take 1 tablet (30 mg total) by mouth daily. 30 tablet 1 05/21/2024   loperamide  (IMODIUM ) 2 MG capsule Take 1 capsule (2 mg total) by mouth as needed for diarrhea or loose stools. 30 capsule 0 Unknown   mirtazapine  (REMERON ) 7.5 MG tablet Take 7.5 mg by mouth at bedtime.   05/21/2024   Oyster Shell Calcium  500 MG TABS Take 500 mg by mouth daily.   05/21/2024   sertraline  (ZOLOFT ) 100 MG tablet Take 100 mg by mouth daily.   05/21/2024   ticagrelor  (BRILINTA ) 90 MG TABS tablet Take 1 tablet (90 mg total) by mouth 2 (two) times daily. 30 tablet 1 05/21/2024   Scheduled:   atorvastatin   40 mg Oral Daily   azithromycin   500 mg Oral Daily   doxycycline   100 mg Oral Q12H   furosemide   20 mg Intravenous BID   Gerhardt's butt cream   Topical BID    guaiFENesin   600 mg Oral BID   nitroGLYCERIN   0.5 inch Topical Q6H   pantoprazole   40 mg Oral BID AC   ranolazine   500 mg Oral BID   sertraline   100 mg Oral Daily   sodium chloride  flush  3 mL Intravenous Q12H   ticagrelor   90 mg Oral BID   Infusions:   heparin  850 Units/hr (05/24/24 1107)   PRN: acetaminophen  **OR** acetaminophen , albuterol , morphine  injection, trimethobenzamide   Assessment: Pt admitted for CP and elevated troponin. Heparin  ordered for ACS. Not on anticoagulation prior to admission. STEMI in 03/2022 in the setting of cocaine use.    Heparin  level came back therapeutic tonight. Cont current rate.   Goal of Therapy:  Heparin  level 0.3-0.7 units/ml Monitor platelets by anticoagulation protocol: Yes   Plan:  Cont heparin  infusion  850 units/hr Daily heparin  level while on heparin  Continue to monitor H&H and platelets  Sergio Batch, PharmD, BCIDP, AAHIVP, CPP Infectious Disease Pharmacist 05/24/2024 9:02 PM

## 2024-05-24 NOTE — Progress Notes (Signed)
 Palliative Medicine Inpatient Follow Up Note HPI: Cassidy Larson is a 68 y.o. female with medical history significant of ASCVD and MI status post PCI October 2023, RA, hypertension, hyperlipidemia, CKD 3B, SLE. Admitted with chest pain and difficulty breathing following a recent hospitalization for pneumonia. Palliative care has been asked to support additional goals of care conversations in the setting of sepsis.   Today's Discussion 05/24/2024  *Please note that this is a verbal dictation therefore any spelling or grammatical errors are due to the Dragon Medical One system interpretation.  I reviewed the chart notes including nursing notes from today, progress notes from today. I also reviewed vital signs, nursing flowsheets, medication administrations record, labs, and imaging.    Oral Intake %: None at this time I/O:   (-) Bowel Movements:  Last 12/8 Mobility: Limited at this time - has had increase fatiuge  I met with Cassidy Larson at bedside this morning. She is awake and alert. She shares she does not feel her chest pain is any better today than yesterday. She endorses still feeling poorly overall. Created space and opportunity for patient to explore thoughts feelings and fears regarding current medical situation. She appears at peace during my time with her. She continues to express a readiness to meet the lord when its her time.   Have spoken to patients group home manager, Cassidy Larson who shares that she has spoken to patient and tried to find additional family, decision makers though has not been successful. She requests updates pending patient improvements or declines. She continues to try to look for family.   I have reached out to the [facility] MSW to try to find surrogate(s).   Plan to continue current care at this time.   Questions and concerns addressed/Palliative Support Provided.   Objective Assessment: Vital Signs Vitals:   05/24/24 1226 05/24/24 1230  BP: 114/71    Pulse: 81   Resp: (!) 32 (!) 23  Temp: 97.9 F (36.6 C)   SpO2: 91%     Intake/Output Summary (Last 24 hours) at 05/24/2024 1307 Last data filed at 05/24/2024 0500 Gross per 24 hour  Intake 3 ml  Output 400 ml  Net -397 ml   Last Weight  Most recent update: 05/24/2024  5:21 AM    Weight  46.8 kg (103 lb 2.8 oz)            Gen: Elderly African-American female chronically ill in appearance HEENT: Dry mucous membranes CV: Regular rate and rhythm PULM: On 6 L nasal cannula breathing is rapid ABD: soft/nontender EXT: No edema Neuro: Alert and oriented x2-3  SUMMARY OF RECOMMENDATIONS   DNAR/DNI   Continue to allow time for outcomes --> Patient is accepting of however her health goes and expresses readiness to meet the lord when it is her time   I have reached out to patient's group home to see if there is a surrogate decision maker listed anywhere though as of presently no one has been identified --> have escalated this to the social work team   Ongoing palliative care support  ______________________________________________________________________________________ Cassidy Larson [place] Palliative Medicine Team Team Cell Phone: [phone] Please utilize secure chat with additional questions, if there is no response within 30 minutes please call the above phone number  Billing based on MDM: Moderate   Palliative Medicine Team providers are available by phone from 7am to 7pm daily and can be reached through the team cell phone.  Should this patient require assistance outside of  these hours, please call the patient's attending physician.

## 2024-05-24 NOTE — Progress Notes (Addendum)
 Progress Note  Patient Name: Cassidy Larson Date of Encounter: 05/24/2024 [facility] Cardiologist: Lonni Cash, MD   Interval Summary    Patient is a bit of a poor historian and it is difficult to keep her on topic. She tells me that she continues to have some chest pain which has been constant for a few days. Pain is reproducible on palpation and worsens if she coughs. She has some shortness of breath and back pain as well.   Vital Signs Vitals:   05/24/24 0400 05/24/24 0450 05/24/24 0500 05/24/24 0752  BP: 110/73 105/70  105/70  Pulse: 93 81  84  Resp: (!) 27 (!) 24  (!) 24  Temp:  97.7 F (36.5 C)  97.8 F (36.6 C)  TempSrc:  Oral  Oral  SpO2: 93% 92%  93%  Weight:   46.8 kg   Height:        Intake/Output Summary (Last 24 hours) at 05/24/2024 0900 Last data filed at 05/24/2024 0500 Gross per 24 hour  Intake 3 ml  Output 400 ml  Net -397 ml      05/24/2024    5:00 AM 05/23/2024    5:00 AM 05/22/2024    7:25 AM  Last 3 Weights  Weight (lbs) 103 lb 2.8 oz 109 lb 12.6 oz 100 lb 5 oz  Weight (kg) 46.8 kg 49.8 kg 45.5 kg      Telemetry/ECG  NSR - Personally Reviewed  Physical Exam  GEN: Frail, elderly female. Laying in the bed with head elevated  Neck: No JVD Cardiac:  RRR, no murmurs, rubs, or gallops. Chest wall tender to palpation  Respiratory: Crackles in bilateral lung bases  GI: Soft, nontender, non-distended  MS: No edema in BLE   Assessment & Plan   CAD  Chest pain  - Patient previously had STEMI in 03/2022 in the setting of cocaine use.  Received DES to mid LAD.  There was CTO of the RCA with left-to-right collaterals.  Moderate severe mid circumflex stenosis.  Considered PCI on the mid circumflex, however lesion was not critical.  Recommended observing her compliance with medical therapy and cocaine abstinence before placing additional stents.  Patient has not followed up with cardiology since that time. - Patient presented with  chest pain, hypotension. Also had flank pain.  - Initial EKG showed ST depression in anterior leads, concerning for possible infarct.  Repeat EKG showed no ST changes.  Patient was seen by Dr. Jordan, felt that EKG changes were due to hypotension in setting of known disease.  Was not considered to be a STEMI - hsTn peaked at 2686. Unclear if this is type 1 NSTEMI or type 2 from recovering sepsis. Today she continues to have chest pain that is reproducible on palpation. Atypical   - Echocardiogram showed EF 60-65%, no wall motion abnormalities, normal RV systolic function, mild-moderate TR, severe MR - Patient told Dr. Santo that she did not want procedural intervention. Currently no plans for cath, continue medical management for now. We discussed this again today. Patient tells me that she will not do any procedures unless God says it is OK.  - Patient was seen by palliative care yesterday-  working to find out if patient has legal guardian  - With  her atypical chest pain, reassuring echo, and inability to consent to cath, plan for medical management. Dr. Kriste will see later today to confirm plan  - Continue lipitor  40 mg daily, brilinta  90 mg BID, ranexa  500  mg BID    Elevated  BNP  - CTA chest/abd/pelv this admission showed pulmonary edema, small left pleural effusion, reflux of contrast material in the IVC consistent with right heart failure.  Also showed progressive left upper lobe pneumonia - BNP elevated to 800 on admission. Up to 2000 this AM. Requiring 6 L via [place]  - Agree with IV lasix  20 mg today. She is likely unable to tolerate higher doses with low BP   Valvular disease  - Echo this admission showed severe MAC, restriction posterior leaflet motion with severe MR. There was also mild-moderate TR - As above, difficult to assess if patient would be open to procedures for further evaluation. Continue diuresis as above    Otherwise per primary  - Pneumonia, SIRS/Sepsis  -  Hypotension  - Normocytic anemia  - CKD stage IIIb  - SLE   - Dysthymic disorder  For questions or updates, please contact [facility] Please consult [email or web address] for contact info under    Signed, [name] FABIENE Louder, PA-C     Patient seen and examined, note reviewed with the signed Advanced Practice Provider. I personally reviewed laboratory data, imaging studies and relevant notes. I independently examined the patient and formulated the important aspects of the plan. I have personally discussed the plan with the patient and/or family. Comments or changes to the note/plan are indicated below.  Patient profile: 68 year old female with past medical history of CAD and STEMI in 2023 in the setting of cocaine use s/p DES to mid LAD and CTO of the RCA with left-to-right collaterals and moderate severe mid circumflex stenosis, triaged to medical therapy, SLE, severe mitral annular calcification with moderate to severe MR who presented with chest pain and elevated troponins concerning for NSTEMI with acute hypoxic respiratory failure and heart failure exacerbation as well as concern for worsening pneumonia prompting admission.  She had an EKG which showed ST depressions in the anterior leads and this was reviewed by the interventionalists who did not feel patient had a STEMI.  She has been declining any invasive angiography.  On my evaluation today, she continues to decline any invasive angiography and would prefer to pursue medical management instead.  She continues to have chest pain.  My Exam:  Physical Exam Vitals and nursing note reviewed.  Constitutional:      Comments: Frail and elderly  Cardiovascular:     Rate and Rhythm: Normal rate and regular rhythm.  Pulmonary:     Effort: Pulmonary effort is normal.     Breath sounds: Normal breath sounds.  Musculoskeletal:        General: No swelling.     Comments: Chest wall tenderness to palpation and deep inspiration  Skin:     Coloration: Skin is not jaundiced or pale.      Assessment & Plan:  Atypical chest pain-chest pain is centralized with abnormalities as below however also reproducible to palpation and inspiration NSTEMI, unclear if type I versus type II-given her history of left circumflex disease and my personal review of the echocardiogram which shows inferior wall hypokinesis with a preserved ejection fraction and abnormal EKG which shows anterior ST depressions and nonspecific lateral changes which have since improved, I am more concerned that she had a type I event.  Patient is currently declining a left heart catheterization for definitive assessment.  For now would continue with dual antiplatelet therapy, statin and continue with diuresis.  Will continue heparin  drip for total of 48 hours from peak troponin.  Acute on chronic diastolic heart failure-effusions noted on CTA chest abdomen pelvis and currently on oxygen.  Would increase Lasix  to 20 mg IV twice daily.  Agree with palliative discussions. Long QT SLE Hyperlipidemia Dysthymic disorder Elevated liver enzymes Tobacco use-  Creatinine-   Time coordinating patient care: 38 minutes  Signed, Emeline Calender, DO [place]  [facility]  05/24/2024 1:38 PM

## 2024-05-24 NOTE — Plan of Care (Signed)
  Problem: Education: Goal: Knowledge of General Education information will improve Description: Including pain rating scale, medication(s)/side effects and non-pharmacologic comfort measures Outcome: Progressing   Problem: Clinical Measurements: Goal: Ability to maintain clinical measurements within normal limits will improve Outcome: Progressing Goal: Will remain free from infection Outcome: Progressing   Problem: Activity: Goal: Risk for activity intolerance will decrease Outcome: Progressing   Problem: Nutrition: Goal: Adequate nutrition will be maintained Outcome: Progressing   Problem: Pain Managment: Goal: General experience of comfort will improve and/or be controlled Outcome: Progressing   Problem: Safety: Goal: Ability to remain free from injury will improve Outcome: Progressing   Problem: Skin Integrity: Goal: Risk for impaired skin integrity will decrease Outcome: Not Progressing

## 2024-05-24 NOTE — Progress Notes (Signed)
 ANTICOAGULATION CONSULT NOTE  Pharmacy Consult for Heparin  Indication: chest pain/ACS  Allergies  Allergen Reactions   Asa [Aspirin ] Diarrhea, Nausea And Vomiting and Other (See Comments)    Severe stomach cramps Diarrhea (sometimes with blood)   Latex Itching   Other Rash    Cotton fabric - rash    Patient Measurements: Height: 5' (152.4 cm) Weight: 46.8 kg (103 lb 2.8 oz) IBW/kg (Calculated) : 45.5 Heparin  Dosing Weight: 45.5 kg  Vital Signs: Temp: 97.8 F (36.6 C) (12/08 0752) Temp Source: Oral (12/08 0752) BP: 105/70 (12/08 0752) Pulse Rate: 84 (12/08 0752)  Labs: Recent Labs    05/22/24 0731 05/22/24 0732 05/22/24 1304 05/22/24 1604 05/23/24 0749 05/23/24 1852  HGB 9.1*  --  8.8*  --   --   --   HCT 28.6*  --  26.6*  --   --   --   PLT 284  --  265  --   --   --   APTT  --  39*  --   --   --   --   LABPROT  --  17.0*  --   --   --   --   INR  --  1.3*  --   --   --   --   HEPARINUNFRC  --   --   --   --  0.28* 0.34  CREATININE  --  1.54*  --   --  1.51*  --   TROPONINIHS  --   --  1,135* 2,686*  --   --     Estimated Creatinine Clearance: 26.3 mL/min (A) (by C-G formula based on SCr of 1.51 mg/dL (H)).   Medical History: Past Medical History:  Diagnosis Date   Anxiety    Blood transfusion without reported diagnosis    CAD in native artery residual post LAD stent, 100% RCA and 80% LCx.  03/30/2022   CHF (congestive heart failure) (HCC)    CKD (chronic kidney disease) stage 4, GFR 15-29 ml/min (HCC)    Cocaine abuse (HCC)    Essential hypertension    HLD (hyperlipidemia) 03/30/2022   Lupus    On mechanically assisted ventilation (HCC) extubated 03/28/22  03/28/2022   Polysubstance abuse (HCC)    Positive ANA (antinuclear antibody)    S/P angioplasty with stent to mLAD 03/27/22 03/30/2022    Medications:  Medications Prior to Admission  Medication Sig Dispense Refill Last Dose/Taking   adalimumab (HUMIRA) 40 MG/0.8ML AJKT pen Inject 40 mg into  the skin every 14 (fourteen) days.      amLODipine (NORVASC) 2.5 MG tablet Take 2.5 mg by mouth daily.      amoxicillin -clavulanate (AUGMENTIN ) 875-125 MG tablet Take 1 tablet by mouth 2 (two) times daily. 10 tablet 0    atorvastatin  (LIPITOR ) 40 MG tablet Take 40 mg by mouth at bedtime.      calcium  carbonate (OS-CAL - DOSED IN MG OF ELEMENTAL CALCIUM ) 1250 (500 Ca) MG tablet Take 1 tablet (1,250 mg total) by mouth daily with breakfast. 30 tablet 0    carvedilol  (COREG ) 6.25 MG tablet Take 1 tablet by mouth 2 (two) times daily with a meal.      dextromethorphan -guaiFENesin  (MUCINEX  DM) 30-600 MG 12hr tablet Take 1 tablet by mouth 2 (two) times daily. 10 tablet 0    GOODSENSE PAIN RELIEF EXTRA ST 500 MG tablet Take 500 mg by mouth every 8 (eight) hours as needed for mild pain (pain score 1-3).  hydrALAZINE  (APRESOLINE ) 25 MG tablet Take 1 tablet by mouth 3 (three) times daily.      hydroxychloroquine (PLAQUENIL) 200 MG tablet Take by mouth.      hydrOXYzine (ATARAX) 50 MG tablet Take 50 mg by mouth 3 (three) times daily.      isosorbide  mononitrate (IMDUR ) 30 MG 24 hr tablet Take 1 tablet (30 mg total) by mouth daily. 30 tablet 1    loperamide  (IMODIUM ) 2 MG capsule Take 1 capsule (2 mg total) by mouth as needed for diarrhea or loose stools. 30 capsule 0    mirtazapine  (REMERON ) 7.5 MG tablet Take 7.5 mg by mouth at bedtime.      sertraline  (ZOLOFT ) 100 MG tablet Take 100 mg by mouth daily.      ticagrelor  (BRILINTA ) 90 MG TABS tablet Take 1 tablet (90 mg total) by mouth 2 (two) times daily. 30 tablet 1    Scheduled:   atorvastatin   40 mg Oral Daily   azithromycin   500 mg Oral Daily   doxycycline   100 mg Oral Q12H   Gerhardt's butt cream   Topical BID   guaiFENesin   600 mg Oral BID   nitroGLYCERIN   0.5 inch Topical Q6H   pantoprazole   40 mg Oral BID AC   ranolazine   500 mg Oral BID   sertraline   100 mg Oral Daily   sodium chloride  flush  3 mL Intravenous Q12H   ticagrelor   90 mg Oral  BID   Infusions:   heparin  750 Units/hr (05/23/24 2317)   PRN: acetaminophen  **OR** acetaminophen , albuterol , morphine  injection, trimethobenzamide   Assessment: Pt admitted for CP and elevated troponin. Heparin  ordered for ACS. Not on anticoagulation prior to admission. STEMI in 03/2022 in the setting of cocaine use.    Heparin  level is subtherapeutic this morning at 0.29 CBC stable. No issues with infusion or bleeding reported.   Goal of Therapy:  Heparin  level 0.3-0.7 units/ml Monitor platelets by anticoagulation protocol: Yes   Plan:  Increase heparin  infusion to 850 units/hr Daily heparin  level while on heparin  Continue to monitor H&H and platelets   Thank you for allowing pharmacy to be a part of this patient's care.   Bascom JAYSON Louder, PharmD 05/24/2024 10:11 AM  **Pharmacist phone directory can be found on [email or web address] listed under [facility]**

## 2024-05-25 DIAGNOSIS — I252 Old myocardial infarction: Secondary | ICD-10-CM | POA: Diagnosis not present

## 2024-05-25 DIAGNOSIS — I2510 Atherosclerotic heart disease of native coronary artery without angina pectoris: Secondary | ICD-10-CM | POA: Diagnosis not present

## 2024-05-25 DIAGNOSIS — R079 Chest pain, unspecified: Secondary | ICD-10-CM | POA: Diagnosis not present

## 2024-05-25 LAB — CBC WITH DIFFERENTIAL/PLATELET
Abs Immature Granulocytes: 0.28 K/uL — ABNORMAL HIGH (ref 0.00–0.07)
Basophils Absolute: 0 K/uL (ref 0.0–0.1)
Basophils Relative: 0 %
Eosinophils Absolute: 0.3 K/uL (ref 0.0–0.5)
Eosinophils Relative: 2 %
HCT: 25.2 % — ABNORMAL LOW (ref 36.0–46.0)
Hemoglobin: 8.4 g/dL — ABNORMAL LOW (ref 12.0–15.0)
Immature Granulocytes: 2 %
Lymphocytes Relative: 10 %
Lymphs Abs: 1.5 K/uL (ref 0.7–4.0)
MCH: 30.9 pg (ref 26.0–34.0)
MCHC: 33.3 g/dL (ref 30.0–36.0)
MCV: 92.6 fL (ref 80.0–100.0)
Monocytes Absolute: 1.4 K/uL — ABNORMAL HIGH (ref 0.1–1.0)
Monocytes Relative: 9 %
Neutro Abs: 12.1 K/uL — ABNORMAL HIGH (ref 1.7–7.7)
Neutrophils Relative %: 77 %
Platelets: 378 K/uL (ref 150–400)
RBC: 2.72 MIL/uL — ABNORMAL LOW (ref 3.87–5.11)
RDW: 14.3 % (ref 11.5–15.5)
WBC: 15.6 K/uL — ABNORMAL HIGH (ref 4.0–10.5)
nRBC: 2.4 % — ABNORMAL HIGH (ref 0.0–0.2)

## 2024-05-25 LAB — C-REACTIVE PROTEIN: CRP: 11.1 mg/dL — ABNORMAL HIGH (ref <1.0)

## 2024-05-25 LAB — BASIC METABOLIC PANEL WITH GFR
Anion gap: 14 (ref 5–15)
BUN: 34 mg/dL — ABNORMAL HIGH (ref 8–23)
CO2: 12 mmol/L — ABNORMAL LOW (ref 22–32)
Calcium: 8.1 mg/dL — ABNORMAL LOW (ref 8.9–10.3)
Chloride: 114 mmol/L — ABNORMAL HIGH (ref 98–111)
Creatinine, Ser: 1.71 mg/dL — ABNORMAL HIGH (ref 0.44–1.00)
GFR, Estimated: 33 mL/min — ABNORMAL LOW (ref >60)
Glucose, Bld: 148 mg/dL — ABNORMAL HIGH (ref 70–99)
Potassium: 3.9 mmol/L (ref 3.5–5.1)
Sodium: 140 mmol/L (ref 135–145)

## 2024-05-25 LAB — TYPE AND SCREEN
ABO/RH(D): O POS
Antibody Screen: NEGATIVE

## 2024-05-25 LAB — PROCALCITONIN: Procalcitonin: 0.12 ng/mL

## 2024-05-25 LAB — MAGNESIUM: Magnesium: 1.8 mg/dL (ref 1.7–2.4)

## 2024-05-25 LAB — HEPARIN LEVEL (UNFRACTIONATED): Heparin Unfractionated: 0.3 [IU]/mL (ref 0.30–0.70)

## 2024-05-25 LAB — ABO/RH: ABO/RH(D): O POS

## 2024-05-25 LAB — BRAIN NATRIURETIC PEPTIDE: B Natriuretic Peptide: 1432.5 pg/mL — ABNORMAL HIGH (ref 0.0–100.0)

## 2024-05-25 MED ORDER — FERROUS SULFATE 325 (65 FE) MG PO TABS
325.0000 mg | ORAL_TABLET | Freq: Two times a day (BID) | ORAL | Status: DC
  Administered 2024-05-25 – 2024-05-26 (×3): 325 mg via ORAL
  Filled 2024-05-25 (×3): qty 1

## 2024-05-25 MED ORDER — NITROGLYCERIN 0.4 MG SL SUBL
0.4000 mg | SUBLINGUAL_TABLET | SUBLINGUAL | Status: DC | PRN
  Administered 2024-05-25: 0.4 mg via SUBLINGUAL

## 2024-05-25 MED ORDER — ALUM & MAG HYDROXIDE-SIMETH 200-200-20 MG/5ML PO SUSP
30.0000 mL | Freq: Four times a day (QID) | ORAL | Status: DC | PRN
  Filled 2024-05-25: qty 30

## 2024-05-25 MED ORDER — DOCUSATE SODIUM 100 MG PO CAPS
200.0000 mg | ORAL_CAPSULE | Freq: Every day | ORAL | Status: DC
  Filled 2024-05-25 (×2): qty 2

## 2024-05-25 MED ORDER — MORPHINE SULFATE (PF) 2 MG/ML IV SOLN
1.0000 mg | INTRAVENOUS | Status: DC | PRN
  Administered 2024-05-25 – 2024-05-26 (×4): 1 mg via INTRAVENOUS
  Filled 2024-05-25 (×4): qty 1

## 2024-05-25 MED ORDER — NITROGLYCERIN 2 % TD OINT
0.5000 [in_us] | TOPICAL_OINTMENT | Freq: Four times a day (QID) | TRANSDERMAL | Status: DC
  Administered 2024-05-25 – 2024-05-26 (×4): 0.5 [in_us] via TOPICAL
  Filled 2024-05-25: qty 30

## 2024-05-25 MED ORDER — LOPERAMIDE HCL 2 MG PO CAPS
2.0000 mg | ORAL_CAPSULE | Freq: Four times a day (QID) | ORAL | Status: DC | PRN
  Administered 2024-05-25 – 2024-05-30 (×3): 2 mg via ORAL
  Filled 2024-05-25 (×4): qty 1

## 2024-05-25 MED ORDER — HEPARIN SODIUM (PORCINE) 5000 UNIT/ML IJ SOLN
5000.0000 [IU] | Freq: Three times a day (TID) | INTRAMUSCULAR | Status: DC
  Administered 2024-05-25 – 2024-05-26 (×2): 5000 [IU] via SUBCUTANEOUS
  Filled 2024-05-25 (×2): qty 1

## 2024-05-25 MED ORDER — POLYETHYLENE GLYCOL 3350 17 G PO PACK: 17.0000 g | PACK | Freq: Every day | ORAL | Status: DC | PRN

## 2024-05-25 MED ORDER — FOLIC ACID 1 MG PO TABS
1.0000 mg | ORAL_TABLET | Freq: Every day | ORAL | Status: DC
  Administered 2024-05-25 – 2024-05-26 (×2): 1 mg via ORAL
  Filled 2024-05-25 (×2): qty 1

## 2024-05-25 MED ORDER — NITROGLYCERIN 0.4 MG SL SUBL
SUBLINGUAL_TABLET | SUBLINGUAL | Status: AC
  Filled 2024-05-25: qty 1

## 2024-05-25 NOTE — Progress Notes (Addendum)
 Progress Note  Patient Name: Cassidy Larson Date of Encounter: 05/25/2024 [facility] Cardiologist: Lonni Cash, MD   Interval Summary    Continues to report chest tightness, stable Chest wall remains tender to palpation Tells me her breathing has been tiring her out Palliative has contacted group home manager who is trying to locate family members to find a surrogate decision maker   Vital Signs Vitals:   05/25/24 0400 05/25/24 0456 05/25/24 0502 05/25/24 0750  BP: (!) 91/59  98/62 (!) 83/60  Pulse: 86   87  Resp: 16   (!) 21  Temp:   (!) 97.2 F (36.2 C) 97.8 F (36.6 C)  TempSrc:   Oral Axillary  SpO2: 97%   95%  Weight:  46.9 kg    Height:        Intake/Output Summary (Last 24 hours) at 05/25/2024 0858 Last data filed at 05/25/2024 0500 Gross per 24 hour  Intake 588.01 ml  Output 350 ml  Net 238.01 ml      05/25/2024    4:56 AM 05/24/2024    5:00 AM 05/23/2024    5:00 AM  Last 3 Weights  Weight (lbs) 103 lb 6.3 oz 103 lb 2.8 oz 109 lb 12.6 oz  Weight (kg) 46.9 kg 46.8 kg 49.8 kg     Telemetry/ECG  Sinus rhythm, HR 80s - Personally Reviewed  Physical Exam  GEN: chronically ill-appearing.   Neck: No JVD Cardiac: RRR, no murmurs, rubs, or gallops, chest wall remains tender to palpation.  Respiratory: some wheezing noted. GI: Soft, nontender, non-distended  MS: No edema  Assessment & Plan   Chest pain CAD s/p STEMI 03/2022 with DES to LAD, CTO of RCA Presented with chest pain, hypotension Initial EKG showed anterior ST depression Follow up EKG normalized  Suspected to be in setting of hypotension with known CAD Echo: LVEF 60-65%, no RWMA, normal RV systolic function Discontinue IV heparin  as she is> 48 hr from peak troponin with no plans for invasive evaluation Has an allergy listed to aspirin  Continue Brilinta  90 mg BID Continue Lipitor  40 mg daily Continue Ranexa  500 mg BID  Elevated BNP 801 ? 2,005 ? 1,432 Unable to tolerate  aggressive diuresis given hypotension Given IV Lasix  20 mg BID yesterday  No good urine output charted  BP 83/60 this AM Continue to follow with palliative discussions for GOC  Valvular disease  Echo this admission showed severe MAC, restriction posterior leaflet motion with severe MR. There was also mild-moderate TR Difficult to assess if patient would be open to procedures for further evaluation  Per primary Pneumonia Hypotension Sepsis  Normocytic anemia  CKD stage 3b SLE Dysthymic disorder Goals of care    For questions or updates, please contact [facility] Please consult [email or web address] for contact info under   Signed, Waddell DELENA Donath, PA-C   Patient seen and examined, note reviewed with the signed Advanced Practice Provider. I personally reviewed laboratory data, imaging studies and relevant notes. I independently examined the patient and formulated the important aspects of the plan. I have personally discussed the plan with the patient and/or family. Comments or changes to the note/plan are indicated below.  HPI: She is sleeping and resting comfortably.  Notified of hypotension.    My Exam:  Physical Exam Vitals and nursing note reviewed.  Constitutional:      Comments: Frail Chronically ill-appearing  Cardiovascular:     Rate and Rhythm: Normal rate.  Pulmonary:     Effort: Pulmonary  effort is normal. No respiratory distress.  Musculoskeletal:     Right lower leg: No edema.     Left lower leg: No edema.      Assessment & Plan:  Atypical chest pain-chest pain is centralized with abnormalities as below however also reproducible to palpation and inspiration. NSTEMI, unclear if type I versus type II-given her history of left circumflex disease and my personal review of the echocardiogram which shows inferior wall hypokinesis with a preserved ejection fraction and abnormal EKG which shows anterior ST depressions and nonspecific lateral changes which  have since improved, I am more concerned that she had a type I event.  Patient has repeatedly declined a left heart catheterization for definitive assessment.  Will pursue medical management at this time.  For now would continue with antiplatelet mono therapy (on Brilinta , has an aspirin  allergy) and statin.  Okay to discontinue heparin . Acute on chronic diastolic heart failure-hold off on further diuresis for now as she is hypotensive and appears euvolemic. Acute respiratory failure with hypoxia from sepsis, CAP with recent Legionella pneumonia in the setting of NSTEMI and acute on chronic diastolic heart failure continue with palliative discussions.  She is not a candidate for aggressive invasive measures. Hypotension hold further antihypertensive and diuresis Long QT SLE Hyperlipidemia Dysthymic disorder Elevated liver enzymes Tobacco use-  Creatinine-   Time coordinating patient care: 36 minutes  No further recommendations at this time, cardiology will sign off.  Please do not hesitate to reach back out with any further questions.  Bonney Emeline Calender, DO [place]  [facility]  05/25/2024 10:21 AM

## 2024-05-25 NOTE — Progress Notes (Signed)
 PROGRESS NOTE     Patient Demographics:    Cassidy Larson, is a 68 y.o. female, DOB - 1955/08/26, FMW:[id number]  Outpatient Primary MD for the patient is [name], FNP    LOS - 3  Admit date - 05/22/2024    Chief Complaint  Patient presents with   Chest Pain       Brief Narrative (HPI from H&P)   68 y.o. 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   She was  just recently been hospitalized from 11/29-12/4 with concern for sepsis thought possibly secondary to a left-sided bacterial community-acquired pneumonia.  Blood cultures were negative and patient was treated with IV cefepime  and transition to oral Augmentin  to complete a 5-day course at discharge. Records note urine Legionella cultures were positive from admission the last admission.   In the ER her workup was consistent with NSTEMI, CHF with acute hypoxic respiratory failure, possible worsening pneumonia and she was admitted to the hospital for    Subjective:   Patient in bed, appears comfortable, denies any headache, no fever, no chest pain or pressure, much improved shortness of breath , no abdominal pain. No focal weakness.   Assessment  & Plan :   Acute respiratory failure with hypoxia with sepsis, community-acquired pneumonia, recent Legionella pneumonia in the setting of NSTEMI and acute on chronic diastolic CHF.  Patient was recently diagnosed with Legionella pneumonia, CT scan shows worsening pulmonary edema and pneumonia, will be placed on azithromycin  along  with Augmentin , azithromycin  total 7 days, Augmentin  x 5, she is already on heparin  drip for NSTEMI, Brilinta  and statin, she is allergic to aspirin .  Diurese as tolerated by blood pressure and kidneys, cardiology has seen the patient, patient prefers general medical treatment if declines full comfort measures.    She is DNR and not a candidate for aggressive invasive procedures or treatment modalities.  Continue present care, supplemental oxygen, I-S and flutter valve for pulmonary toiletry and  monitor.  Overall prognosis is guarded.  NSTEMI with acute on chronic diastolic congestive heart failure exacerbation Suspected acute on chronic.  CTA of the chest noted concern for pulmonary edema with signs of right heart failure.  Echo noted with EF 60% and stable wall motion as reported, heparin  drip per cardiology likely we can stop heparin  drip on 05/25/2024, will defer to cardiology, continue Brilinta , statin, Lasix  as tolerated as needed, supplemental oxygen and monitor, echocardiogram noted and reassuring, cardiology following.  Low-dose nitro paste and Ranexa  for chest pain. Chest pain much improved, repeat low-dose Lasix  as needed, continue to monitor soft blood pressures.      Hypotension Supportive care   Chronic kidney disease stage IIIb Creatinine noted to be 1.54 with BUN 22. At risk for AKI.  Due to hypotension, NSTEMI, monitor.   CAD s/p PCI Remote history of stenting of the mid LAD - Continue Brilinta , Lipitor    Anemia of chronic disease.  Anemia panel suggestive of iron deficiency, placed on replacement, on PPI no signs of ongoing bleeding, poor candidate for EGD or colonoscopy at this time, monitor H&H, she is agreeable for transfusion if needed.  Prolonged QT interval Chronic.  QTc 512 - Monitor electrolytes, minimize QT prolonging medications as much as practically possible, due to recent Legionella pneumonia and worsening pneumonia on CT despite treatment before azithromycin  x 7  days.   Systemic lupus Review of records note patient with a history of positive ANA back in 2019.  Previously had been on Plaquenil   Mixed hyperlipidemia - Continue Lipitor    Dysthymic disorder - Continue Zoloft  but held mirtazapine    Elevated liver enzymes Acute.  AST 47 and ALT 52.  Possibly secondary to passive congestion from patient being fluid overload. - Continue to monitor   Tobacco abuse Patient just recently stopped smoking in the last week. - Continue to encourage cessation of tobacco use  Incidental finding on CT.  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 Consider follow-up imaging with repeat CT of the chest in 1 to 3 months to ensure stability and/or resolution.  PCP to monitor.      Condition - Extremely Guarded  Family Communication  : No family, legal guardian listed as Mrs. Ascencion [phone], she was called on 05/23/2024 at 8:30 AM, she informs me that she is not a legal guardian or POA she is simply a production designer, theatre/television/film of the facility where patient lives, she confirms patient has no family and decides for herself.  Code Status : DNR  Consults  : Cardiology, palliative care  PUD Prophylaxis : PPI.   Procedures  :     TTE -  1. Left ventricular ejection fraction, by estimation, is 60 to 65%. The left ventricle has normal function. The left ventricle has no regional wall motion abnormalities. Left ventricular diastolic parameters are indeterminate.  2. Right ventricular systolic function is normal. The right ventricular size is normal.  3. Shadowing artifact in LA from MAC. Restricted posterior leaflet motion. Consider f/u TEE to further evaluate the degree of MR and MV morphology. The mitral valve is abnormal. Severe mitral valve regurgitation. No evidence of mitral stenosis. Severe mitral annular calcification.  4. Tricuspid valve regurgitation is mild to moderate.  5. The aortic valve is normal in structure. Aortic valve regurgitation is  not visualized. No aortic stenosis is present.  6. The inferior vena cava is normal in size with greater than 50% respiratory variability, suggesting right atrial pressure of 3 mmHg.  CT -  1. No evidence of acute aortic syndrome. 2. Progressive left upper lobe pneumonia. 3. Diffuse pulmonary edema and small left pleural effusion. Reflux of contrast material into the IVC and hepatic veins consistent with right heart failure. 4. No central pulmonary arterial occlusion. 5. Enlarging mediastinal lymph nodes are nonspecific in the setting of chf and pneumonia. 6. 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 Consider follow-up imaging with repeat CT of the chest in 1 to 3 months to ensure stability and/or resolution. 7. Aortic atherosclerotic calcifications and coronary artery calcifications. 8. Progressive increased size of bilateral inguinal and left external iliac lymph nodes compared with 05/17/2024. Nonspecific but favor reactive changes.      Disposition Plan  :    Status is: Inpatient  DVT Prophylaxis  :  Hep gtt   Lab Results  Component Value Date   PLT 378 05/25/2024    Diet :  Diet Order             DIET DYS 3 Room service appropriate? Yes; Fluid consistency: Thin  Diet effective now                    Inpatient Medications  Scheduled Meds:  atorvastatin   40 mg Oral Daily   azithromycin   500 mg Oral Daily   doxycycline   100 mg Oral Q12H   feeding supplement  237 mL Oral BID BM   furosemide   20 mg Intravenous BID   Gerhardt's butt cream   Topical BID   guaiFENesin   600 mg Oral BID   nitroGLYCERIN   0.5 inch Topical Q6H   pantoprazole   40 mg Oral BID AC   ranolazine   500 mg Oral BID   sertraline   100 mg Oral Daily   sodium chloride  flush  3 mL Intravenous Q12H   ticagrelor   90 mg Oral BID   Continuous Infusions:  heparin  850 Units/hr (05/25/24 0727)   PRN Meds:.acetaminophen  **OR** acetaminophen ,  albuterol , trimethobenzamide     Objective:   Vitals:   05/25/24 0400 05/25/24 0456 05/25/24 0502 05/25/24 0750  BP: (!) 91/59  98/62 (!) 83/60  Pulse: 86   87  Resp: 16   (!) 21  Temp:   (!) 97.2 F (36.2 C) 97.8 F (36.6 C)  TempSrc:   Oral Axillary  SpO2: 97%   95%  Weight:  46.9 kg    Height:        Wt Readings from Last 3 Encounters:  05/25/24 46.9 kg  05/15/24 45.2 kg  10/20/22 43.5 kg     Intake/Output Summary (Last 24 hours) at 05/25/2024 0800 Last data filed at 05/25/2024 0500 Gross per 24 hour  Intake 588.01 ml  Output 350 ml  Net 238.01 ml     Physical Exam  Awake Alert, No new F.N deficits, Normal affect [place].AT,PERRAL Supple Neck, No JVD,   Symmetrical Chest wall movement, Good air movement bilaterally, few rales RRR,No Gallops,Rubs or new Murmurs,  +ve B.Sounds, Abd Soft, No tenderness,   No Cyanosis, Clubbing or edema       Data Review:    Recent Labs  Lab 05/19/24 0429 05/22/24 0731 05/22/24 1304 05/24/24 0739 05/25/24 0407  WBC 8.5 10.7* 10.8* 17.4* 15.6*  HGB 9.3* 9.1* 8.8* 8.4* 8.4*  HCT 28.7* 28.6* 26.6* 25.6* 25.2*  PLT 158 284 265 367 378  MCV 93.2 93.8 92.7 91.8 92.6  MCH 30.2 29.8 30.7 30.1 30.9  MCHC 32.4 31.8 33.1 32.8 33.3  RDW 13.4 13.8 13.8 14.5 14.3  LYMPHSABS 1.2 1.6 1.1 0.9 1.5  MONOABS 1.8* 1.1* 1.0 1.2* 1.4*  EOSABS 0.1 0.5 0.2 0.3 0.3  BASOSABS 0.0 0.0 0.0 0.0 0.0    Recent Labs  Lab 05/19/24 0429 05/22/24 0732 05/22/24 1104 05/22/24 1304 05/23/24 0749 05/24/24 0739 05/25/24 0407  NA 141 140  --   --  141 139 140  K 3.5 3.7  --   --  4.1 4.2 3.9  CL 110 110  --   --  118* 113* 114*  CO2 21* 17*  --   --  14* 15* 12*  ANIONGAP 10 13  --   --  9 11 14   GLUCOSE 116* 168*  --   --  120* 108* 148*  BUN 14 22  --   --  21 26* 34*  CREATININE 1.38* 1.54*  --   --  1.51* 1.67* 1.71*  AST  --  47*  --   --   --   --   --   ALT  --  52*  --   --   --   --   --   ALKPHOS  --  105  --   --   --   --   --    BILITOT  --  0.3  --   --   --   --   --   ALBUMIN  --  3.2*  --   --   --   --   --   CRP  --   --   --   --  10.2* 10.8* 11.1*  PROCALCITON  --   --   --  0.17 0.22 0.15 0.12  LATICACIDVEN  --   --   --  1.0  --   --   --   INR  --  1.3*  --   --   --   --   --   HGBA1C  --  5.8*  --   --   --   --   --   BNP  --   --  801.9*  --  1,974.0* 2,005.7* 1,432.5*  MG  --   --   --   --  2.3 2.1 1.8  CALCIUM  8.1* 8.3*  --   --  7.8* 7.9* 8.1*      Recent Labs  Lab 05/19/24 0429 05/22/24 0732 05/22/24 1104 05/22/24 1304 05/23/24 0749 05/24/24 0739 05/25/24 0407  CRP  --   --   --   --  10.2* 10.8* 11.1*  PROCALCITON  --   --   --  0.17 0.22 0.15 0.12  LATICACIDVEN  --   --   --  1.0  --   --   --   INR  --  1.3*  --   --   --   --   --   HGBA1C  --  5.8*  --   --   --   --   --   BNP  --   --  801.9*  --  1,974.0* 2,005.7* 1,432.5*  MG  --   --   --   --  2.3 2.1 1.8  CALCIUM  8.1* 8.3*  --   --  7.8* 7.9* 8.1*    --------------------------------------------------------------------------------------------------------------- Lab Results  Component Value Date   CHOL 95 05/22/2024   HDL 18 (L) 05/22/2024   LDLCALC 43 05/22/2024   TRIG 173 (H) 05/22/2024  CHOLHDL 5.3 05/22/2024    Lab Results  Component Value Date   HGBA1C 5.8 (H) 05/22/2024   No results for input(s): TSH, T4TOTAL, FREET4, T3FREE, THYROIDAB in the last 72 hours. Recent Labs    05/24/24 1020  VITAMINB12 441  FOLATE 11.3  FERRITIN 182  TIBC 206*  IRON 26*  RETICCTPCT 2.3   ------------------------------------------------------------------------------------------------------------------ Cardiac Enzymes No results for input(s): CKMB, TROPONINI, MYOGLOBIN in the last 168 hours.  Invalid input(s): CK  Micro Results Recent Results (from the past 240 hours)  Blood Culture (routine x 2)     Status: None   Collection Time: 05/15/24  8:24 PM   Specimen: BLOOD LEFT WRIST  Result Value  Ref Range Status   Specimen Description BLOOD LEFT WRIST  Final   Special Requests   Final    BOTTLES DRAWN AEROBIC AND ANAEROBIC Blood Culture adequate volume   Culture   Final    NO GROWTH 5 DAYS Performed at [facility], [address], [place], [place] [address]    Report Status 05/20/2024 FINAL  Final  Resp panel by RT-PCR (RSV, Flu A&B, Covid) Anterior Nasal Swab     Status: None   Collection Time: 05/15/24  8:38 PM   Specimen: Anterior Nasal Swab  Result Value Ref Range Status   SARS Coronavirus 2 by RT PCR NEGATIVE NEGATIVE Final    Comment: (NOTE) SARS-CoV-2 target nucleic acids are NOT DETECTED.  The SARS-CoV-2 RNA is generally detectable in upper respiratory specimens during the acute phase of infection. The lowest concentration of SARS-CoV-2 viral copies this assay can detect is 138 copies/mL. A negative result does not preclude SARS-Cov-2 infection and should not be used as the sole basis for treatment or other patient management decisions. A negative result may occur with  improper specimen collection/handling, submission of specimen other than nasopharyngeal swab, presence of viral mutation(s) within the areas targeted by this assay, and inadequate number of viral copies(<138 copies/mL). A negative result must be combined with clinical observations, patient history, and epidemiological information. The expected result is Negative.  Fact Sheet for Patients:  bloggercourse.com  Fact Sheet for Healthcare Providers:  seriousbroker.it  This test is no t yet approved or cleared by the [place]  FDA and  has been authorized for detection and/or diagnosis of SARS-CoV-2 by FDA under an Emergency Use Authorization (EUA). This EUA will remain  in effect (meaning this test can be used) for the duration of the COVID-19 declaration under Section 564(b)(1) of the Act, 21 U.S.C.section 360bbb-3(b)(1), unless the  authorization is terminated  or revoked sooner.       Influenza A by PCR NEGATIVE NEGATIVE Final   Influenza B by PCR NEGATIVE NEGATIVE Final    Comment: (NOTE) The Xpert Xpress SARS-CoV-2/FLU/RSV plus assay is intended as an aid in the diagnosis of influenza from Nasopharyngeal swab specimens and should not be used as a sole basis for treatment. Nasal washings and aspirates are unacceptable for Xpert Xpress SARS-CoV-2/FLU/RSV testing.  Fact Sheet for Patients: bloggercourse.com  Fact Sheet for Healthcare Providers: seriousbroker.it  This test is not yet approved or cleared by the [place]  FDA and has been authorized for detection and/or diagnosis of SARS-CoV-2 by FDA under an Emergency Use Authorization (EUA). This EUA will remain in effect (meaning this test can be used) for the duration of the COVID-19 declaration under Section 564(b)(1) of the Act, 21 U.S.C. section 360bbb-3(b)(1), unless the authorization is terminated or revoked.     Resp Syncytial Virus by  PCR NEGATIVE NEGATIVE Final    Comment: (NOTE) Fact Sheet for Patients: bloggercourse.com  Fact Sheet for Healthcare Providers: seriousbroker.it  This test is not yet approved or cleared by the [place]  FDA and has been authorized for detection and/or diagnosis of SARS-CoV-2 by FDA under an Emergency Use Authorization (EUA). This EUA will remain in effect (meaning this test can be used) for the duration of the COVID-19 declaration under Section 564(b)(1) of the Act, 21 U.S.C. section 360bbb-3(b)(1), unless the authorization is terminated or revoked.  Performed at [facility], [address] [place], [place] [address]   Blood Culture (routine x 2)     Status: None   Collection Time: 05/15/24  8:38 PM   Specimen: Right Antecubital; Blood  Result Value Ref Range Status   Specimen Description RIGHT  ANTECUBITAL  Final   Special Requests   Final    BOTTLES DRAWN AEROBIC ONLY Blood Culture adequate volume   Culture   Final    NO GROWTH 5 DAYS Performed at [facility], [address], [place], [place] [address]    Report Status 05/20/2024 FINAL  Final  C Difficile Quick Screen w PCR reflex     Status: None   Collection Time: 05/16/24  3:14 AM   Specimen: STOOL  Result Value Ref Range Status   C Diff antigen NEGATIVE NEGATIVE Final   C Diff toxin NEGATIVE NEGATIVE Final   C Diff interpretation No C. difficile detected.  Final    Comment: Performed at [facility], [address] [place], [place] [address]  Gastrointestinal Panel by PCR , Stool     Status: None   Collection Time: 05/16/24  2:40 PM   Specimen: Stool  Result Value Ref Range Status   Campylobacter species NOT DETECTED NOT DETECTED Final   Plesimonas shigelloides NOT DETECTED NOT DETECTED Final   Salmonella species NOT DETECTED NOT DETECTED Final   Yersinia enterocolitica NOT DETECTED NOT DETECTED Final   Vibrio species NOT DETECTED NOT DETECTED Final   Vibrio cholerae NOT DETECTED NOT DETECTED Final   Enteroaggregative E coli (EAEC) NOT DETECTED NOT DETECTED Final   Enteropathogenic E coli (EPEC) NOT DETECTED NOT DETECTED Final   Enterotoxigenic E coli (ETEC) NOT DETECTED NOT DETECTED Final   Shiga like toxin producing E coli (STEC) NOT DETECTED NOT DETECTED Final   Shigella/Enteroinvasive E coli (EIEC) NOT DETECTED NOT DETECTED Final   Cryptosporidium NOT DETECTED NOT DETECTED Final   Cyclospora cayetanensis NOT DETECTED NOT DETECTED Final   Entamoeba histolytica NOT DETECTED NOT DETECTED Final   Giardia lamblia NOT DETECTED NOT DETECTED Final   Adenovirus F40/41 NOT DETECTED NOT DETECTED Final   Astrovirus NOT DETECTED NOT DETECTED Final   Norovirus GI/GII NOT DETECTED NOT DETECTED Final   Rotavirus A NOT DETECTED NOT DETECTED Final   Sapovirus (I, II, IV, and V) NOT DETECTED NOT DETECTED Final    Comment:  Performed at [facility], [address] [place], [place] [address]  MRSA Next Gen by PCR, Nasal     Status: None   Collection Time: 05/22/24  7:03 AM  Result Value Ref Range Status   MRSA by PCR Next Gen NOT DETECTED NOT DETECTED Final    Comment: (NOTE) The GeneXpert MRSA Assay (FDA approved for NASAL specimens only), is one component of a comprehensive MRSA colonization surveillance program. It is not intended to diagnose MRSA infection nor to guide or monitor treatment for MRSA infections. Test performance is not FDA approved in patients  less than 53 years old. Performed at [facility], [address], [place], [place] [address]   Blood culture (routine x 2)     Status: None (Preliminary result)   Collection Time: 05/22/24 10:55 AM   Specimen: BLOOD RIGHT ARM  Result Value Ref Range Status   Specimen Description BLOOD RIGHT ARM  Final   Special Requests   Final    BOTTLES DRAWN AEROBIC AND ANAEROBIC Blood Culture results may not be optimal due to an inadequate volume of blood received in culture bottles   Culture   Final    NO GROWTH 2 DAYS Performed at [facility], [address], [place], [place] [address]    Report Status PENDING  Incomplete  Blood culture (routine x 2)     Status: None (Preliminary result)   Collection Time: 05/22/24 11:00 AM   Specimen: BLOOD LEFT ARM  Result Value Ref Range Status   Specimen Description BLOOD LEFT ARM  Final   Special Requests   Final    BOTTLES DRAWN AEROBIC AND ANAEROBIC Blood Culture results may not be optimal due to an inadequate volume of blood received in culture bottles   Culture   Final    NO GROWTH 2 DAYS Performed at [facility], [address], [place], [place] [address]    Report Status PENDING  Incomplete    Radiology Report CT CHEST WO CONTRAST Result Date: 05/24/2024 CLINICAL DATA:  Pneumonia. EXAM: CT CHEST WITHOUT CONTRAST TECHNIQUE: Multidetector CT imaging of the chest was  performed following the standard protocol without IV contrast. RADIATION DOSE REDUCTION: This exam was performed according to the departmental dose-optimization program which includes automated exposure control, adjustment of the mA and/or kV according to patient size and/or use of iterative reconstruction technique. COMPARISON:  05/22/2024 FINDINGS: Cardiovascular: The heart size is normal. No substantial pericardial effusion. Coronary artery calcification is evident. No substantial pericardial effusion. Mediastinum/Nodes: 12 mm short axis right paratracheal lymph node. 12 mm short axis subcarinal lymph node. Hilar regions not well evaluated on noncontrast imaging. The esophagus has normal imaging features. Multinodular thyroid enlargement is similar to prior. Small axillary nodes evident bilaterally. Lungs/Pleura: Interval progression of dense consolidative opacity in the left upper lobe with left upper lobe dependent interstitial thickening similar to prior. There is more confluent airspace disease in the posterior left upper lobe today. Interval progression of bilateral lower lobe collapse/consolidation. Small to moderate bilateral pleural effusions are progressive. Upper Abdomen: 2.3 cm left adrenal nodule cannot be definitively characterized. Musculoskeletal: No worrisome lytic or sclerotic osseous abnormality. IMPRESSION: 1. Interval progression of dense consolidative opacity in the left upper lobe with more confluent airspace disease in the posterior left upper lobe today. Imaging features compatible with progressive pneumonia. 2. Interval progression of bilateral lower lobe collapse/consolidation. 3. Small to moderate bilateral pleural effusions are progressive. 4. Mild mediastinal lymphadenopathy, likely reactive. Follow-up recommended to ensure resolution. 5. 2.3 cm left adrenal nodule cannot be definitively characterized. This is not substantially changed since a CT of 01/01/2010 compatible with benign  etiology such as adenoma. 6. Multinodular thyroid enlargement is similar to prior. Electronically Signed   By: Camellia Candle M.D.   On: 05/24/2024 12:50   DG Chest Port 1 View Result Date: 05/24/2024 CLINICAL DATA:  Shortness of breath. EXAM: PORTABLE CHEST 1 VIEW COMPARISON:  05/22/2024 FINDINGS: Focal masslike consolidative opacity in the left upper lung is similar to prior. The cardio pericardial silhouette is enlarged. New diffuse interstitial opacity  suggests edema. Left pleural effusion evident. Telemetry leads overlie the chest. IMPRESSION: 1. New diffuse interstitial opacity suggests edema. 2. Persistent masslike consolidative opacity in the left upper lung. 3. Left pleural effusion. Electronically Signed   By: Camellia Candle M.D.   On: 05/24/2024 10:32   ECHOCARDIOGRAM COMPLETE Result Date: 05/23/2024    ECHOCARDIOGRAM REPORT   Patient Name:   RICKIYA Jarmon Date of Exam: 05/23/2024 Medical Rec #:  [id number]        Height:       60.0 in Accession #:    [id number]       Weight:       109.8 lb Date of Birth:  1955/10/12        BSA:          1.447 m Patient Age:    68 years         BP:           101/73 mmHg Patient Gender: F                HR:           81 bpm. Exam Location:  Inpatient Procedure: 2D Echo, Cardiac Doppler and Color Doppler (Both Spectral and Color            Flow Doppler were utilized during procedure). Indications:    CHF-Acute Systolic I50.21  History:        Patient has prior history of Echocardiogram examinations, most                 recent 04/22/2022. CHF, CAD; Risk Factors:Hypertension.  Sonographer:    Jayson Gaskins Referring Phys: [id number] RONDELL A SMITH IMPRESSIONS  1. Left ventricular ejection fraction, by estimation, is 60 to 65%. The left ventricle has normal function. The left ventricle has no regional wall motion abnormalities. Left ventricular diastolic parameters are indeterminate.  2. Right ventricular systolic function is normal. The right ventricular size is normal.  3.  Shadowing artifact in LA from MAC. Restricted posterior leaflet motion. Consider f/u TEE to further evaluate the degree of MR and MV morphology. The mitral valve is abnormal. Severe mitral valve regurgitation. No evidence of mitral stenosis. Severe mitral annular calcification.  4. Tricuspid valve regurgitation is mild to moderate.  5. The aortic valve is normal in structure. Aortic valve regurgitation is not visualized. No aortic stenosis is present.  6. The inferior vena cava is normal in size with greater than 50% respiratory variability, suggesting right atrial pressure of 3 mmHg. FINDINGS  Left Ventricle: Left ventricular ejection fraction, by estimation, is 60 to 65%. The left ventricle has normal function. The left ventricle has no regional wall motion abnormalities. Strain was performed and the global longitudinal strain is indeterminate. The left ventricular internal cavity size was normal in size. There is no left ventricular hypertrophy. Left ventricular diastolic parameters are indeterminate. Right Ventricle: The right ventricular size is normal. No increase in right ventricular wall thickness. Right ventricular systolic function is normal. Left Atrium: Left atrial size was normal in size. Right Atrium: Right atrial size was normal in size. Pericardium: There is no evidence of pericardial effusion. Mitral Valve: Shadowing artifact in LA from MAC. Restricted posterior leaflet motion. Consider f/u TEE to further evaluate the degree of MR and MV morphology. The mitral valve is abnormal. There is moderate thickening of the mitral valve leaflet(s). There is moderate calcification of the mitral valve leaflet(s). Severe mitral annular calcification. Severe mitral valve regurgitation. No evidence of mitral  valve stenosis. Tricuspid Valve: The tricuspid valve is normal in structure. Tricuspid valve regurgitation is mild to moderate. No evidence of tricuspid stenosis. Aortic Valve: The aortic valve is normal in  structure. Aortic valve regurgitation is not visualized. No aortic stenosis is present. Aortic valve mean gradient measures 2.0 mmHg. Aortic valve peak gradient measures 4.2 mmHg. Aortic valve area, by VTI measures 2.34 cm. Pulmonic Valve: The pulmonic valve was normal in structure. Pulmonic valve regurgitation is not visualized. No evidence of pulmonic stenosis. Aorta: The aortic root is normal in size and structure. Venous: The inferior vena cava is normal in size with greater than 50% respiratory variability, suggesting right atrial pressure of 3 mmHg. IAS/Shunts: No atrial level shunt detected by color flow Doppler. Additional Comments: 3D was performed not requiring image post processing on an independent workstation and was indeterminate.  LEFT VENTRICLE PLAX 2D LVIDd:         3.90 cm   Diastology LVIDs:         2.90 cm   LV e' medial:    8.92 cm/s LV PW:         0.90 cm   LV E/e' medial:  10.6 LV IVS:        0.90 cm   LV e' lateral:   8.05 cm/s LVOT diam:     1.70 cm   LV E/e' lateral: 11.8 LV SV:         37 LV SV Index:   25 LVOT Area:     2.27 cm  RIGHT VENTRICLE            IVC RV S prime:     8.70 cm/s  IVC diam: 1.80 cm TAPSE (M-mode): 2.3 cm LEFT ATRIUM             Index        RIGHT ATRIUM           Index LA Vol (A2C):   30.9 ml 21.36 ml/m  RA Area:     10.10 cm LA Vol (A4C):   25.6 ml 17.70 ml/m  RA Volume:   21.00 ml  14.52 ml/m LA Biplane Vol: 29.0 ml 20.05 ml/m  AORTIC VALVE AV Area (Vmax):    1.78 cm AV Area (Vmean):   1.59 cm AV Area (VTI):     2.34 cm AV Vmax:           102.00 cm/s AV Vmean:          75.400 cm/s AV VTI:            0.156 m AV Peak Grad:      4.2 mmHg AV Mean Grad:      2.0 mmHg LVOT Vmax:         79.80 cm/s LVOT Vmean:        52.700 cm/s LVOT VTI:          0.161 m LVOT/AV VTI ratio: 1.03  AORTA Ao Root diam: 2.30 cm MITRAL VALVE MV Area (PHT): 4.77 cm    SHUNTS MV Decel Time: 159 msec    Systemic VTI:  0.16 m MV E velocity: 94.90 cm/s  Systemic Diam: 1.70 cm MV A  velocity: 48.20 cm/s MV E/A ratio:  1.97 Maude Emmer MD Electronically signed by Maude Emmer MD Signature Date/Time: 05/23/2024/12:26:43 PM    Final      Signature  -   Lavada Stank M.D on 05/25/2024 at 8:00 AM   -  To page go to [email or web address]

## 2024-05-25 NOTE — Progress Notes (Signed)
   Palliative Medicine Inpatient Follow Up Note HPI: Cassidy Larson is a 68 y.o. female with medical history significant of ASCVD and MI status post PCI October 2023, RA, hypertension, hyperlipidemia, CKD 3B, SLE. Admitted with chest pain and difficulty breathing following a recent hospitalization for pneumonia. Palliative care has been asked to support additional goals of care conversations in the setting of sepsis.   Today's Discussion 05/25/2024  *Please note that this is a verbal dictation therefore any spelling or grammatical errors are due to the Dragon Medical One system interpretation.  I reviewed the chart notes including nursing notes from April Spears, progress notes from Dr. Dennise, Dr. Kriste, and MSW Cyrus. I also reviewed vital signs, nursing flowsheets, medication administrations record, labs BMP 140, K 3.9, Chl 114, CO2 12, Glu 148, BUN 34, Cr 1.71, Ca 8.1, Anion Gap 14, GFR 33, and imaging CXR 12/8 - w/ LUL opacification.    Oral Intake %: 60% I/O:   (-) Bowel Movements:  Last 12/9 Mobility: Limited at this time    I met with [name] at bedside this afternoon. She is awake and alert to person and place. She does seem to have some understanding of her illness and when asked shares she is here for her chest pain. [name] does feel that her chest pain has had improvement and is not feeling any at the time of assessment. She shares she has had an increase in her appetite and is generally feeling better than two days ago.   Appreciate TOC team diligently working towards finding a runner, broadcasting/film/video although none has been identified to date.   Plan to continue current care at this time.   Questions and concerns addressed/Palliative Support Provided.   Objective Assessment: Vital Signs Vitals:   05/25/24 1139 05/25/24 1200  BP: 95/67 96/68  Pulse: 83 82  Resp: (!) 22 15  Temp: 97.7 F (36.5 C)   SpO2: 96% 97%    Intake/Output Summary (Last 24 hours) at  05/25/2024 1444 Last data filed at 05/25/2024 0500 Gross per 24 hour  Intake 588.01 ml  Output 350 ml  Net 238.01 ml   Last Weight  Most recent update: 05/25/2024  4:56 AM    Weight  46.9 kg (103 lb 6.3 oz)            Gen: Elderly African-American female chronically ill in appearance HEENT: Dry mucous membranes CV: Regular rate and rhythm PULM: On 6 L nasal cannula breathing is rapid ABD: soft/nontender EXT: No edema Neuro: Alert and oriented x2-3  SUMMARY OF RECOMMENDATIONS   DNAR/DNI   Continue to allow time for outcomes --> Patient is accepting of however her health goes and expresses readiness to meet the lord when it is her time   Appreciate MSW team looking into surrogate decision makers --> If patient should decline from a mental perspective it would be reasonable to pursue guardianship in the future should she lack decision making capacity   Ongoing palliative care support ______________________________________________________________________________________ Rosaline Becton [facility] Palliative Medicine Team Team Cell Phone: [phone] Please utilize secure chat with additional questions, if there is no response within 30 minutes please call the above phone number  Billing based on MDM: Moderate   Palliative Medicine Team providers are available by phone from 7am to 7pm daily and can be reached through the team cell phone.  Should this patient require assistance outside of these hours, please call the patient's attending physician.

## 2024-05-25 NOTE — TOC Progression Note (Signed)
 Transition of Care [facility]) - Progression Note    Patient Details  Name: Cassidy Larson MRN: [id number] Date of Birth: [birth year]  Transition of Care [facility]) CM/SW Contact  [name], [place] Phone Number: 05/25/2024, 2:27 PM  Clinical Narrative:     TOC consulted to identify next of kin for pt. CSW discussed situation with Palliative. Palliative have made extensive efforts in attempting to locate family/friends of pt to no avail. Group Home manager has also been unable to locate any family or friends.   Inpatient Care Management supervisor utilized law enforcement to attempt to identify family/friends of pt though this also ended with no success. At this exhausted all leads to reach family.        Social Drivers of Health (SDOH) Interventions SDOH Screenings   Food Insecurity: No Food Insecurity (05/22/2024)  Housing: Unknown (05/22/2024)  Transportation Needs: No Transportation Needs (05/22/2024)  Recent Concern: Transportation Needs - Unmet Transportation Needs (05/15/2024)  Utilities: Not At Risk (05/22/2024)  Depression (PHQ2-9): Low Risk  (03/09/2019)  Financial Resource Strain: Not on File (11/14/2022)   Received from [facility]  Physical Activity: Not on File (11/14/2022)   Received from [facility]  Social Connections: Socially Isolated (05/22/2024)  Stress: Not on File (11/14/2022)   Received from [facility]  Tobacco Use: High Risk (05/15/2024)    Readmission Risk Interventions    05/16/2024    2:59 PM  Readmission Risk Prevention Plan  Transportation Screening Complete  PCP or Specialist Appt within 5-7 Days Not Complete  Home Care Screening Complete  Medication Review (RN CM) Complete

## 2024-05-25 NOTE — Plan of Care (Signed)

## 2024-05-25 NOTE — Plan of Care (Signed)
  Problem: Clinical Measurements: Goal: Diagnostic test results will improve Outcome: Progressing   Problem: Nutrition: Goal: Adequate nutrition will be maintained Outcome: Progressing   Problem: Pain Managment: Goal: General experience of comfort will improve and/or be controlled Outcome: Progressing

## 2024-05-26 LAB — BASIC METABOLIC PANEL WITH GFR
Anion gap: 8 (ref 5–15)
BUN: 43 mg/dL — ABNORMAL HIGH (ref 8–23)
CO2: 19 mmol/L — ABNORMAL LOW (ref 22–32)
Calcium: 8.4 mg/dL — ABNORMAL LOW (ref 8.9–10.3)
Chloride: 114 mmol/L — ABNORMAL HIGH (ref 98–111)
Creatinine, Ser: 1.8 mg/dL — ABNORMAL HIGH (ref 0.44–1.00)
GFR, Estimated: 31 mL/min — ABNORMAL LOW (ref >60)
Glucose, Bld: 126 mg/dL — ABNORMAL HIGH (ref 70–99)
Potassium: 4.4 mmol/L (ref 3.5–5.1)
Sodium: 141 mmol/L (ref 135–145)

## 2024-05-26 LAB — CBC WITH DIFFERENTIAL/PLATELET
Abs Immature Granulocytes: 0.32 K/uL — ABNORMAL HIGH (ref 0.00–0.07)
Basophils Absolute: 0.1 K/uL (ref 0.0–0.1)
Basophils Relative: 0 %
Eosinophils Absolute: 0.2 K/uL (ref 0.0–0.5)
Eosinophils Relative: 1 %
HCT: 28.3 % — ABNORMAL LOW (ref 36.0–46.0)
Hemoglobin: 9.3 g/dL — ABNORMAL LOW (ref 12.0–15.0)
Immature Granulocytes: 2 %
Lymphocytes Relative: 9 %
Lymphs Abs: 1.5 K/uL (ref 0.7–4.0)
MCH: 30.8 pg (ref 26.0–34.0)
MCHC: 32.9 g/dL (ref 30.0–36.0)
MCV: 93.7 fL (ref 80.0–100.0)
Monocytes Absolute: 1.8 K/uL — ABNORMAL HIGH (ref 0.1–1.0)
Monocytes Relative: 11 %
Neutro Abs: 12.9 K/uL — ABNORMAL HIGH (ref 1.7–7.7)
Neutrophils Relative %: 77 %
Platelets: 424 K/uL — ABNORMAL HIGH (ref 150–400)
RBC: 3.02 MIL/uL — ABNORMAL LOW (ref 3.87–5.11)
RDW: 14.7 % (ref 11.5–15.5)
Smear Review: NORMAL
WBC: 16.8 K/uL — ABNORMAL HIGH (ref 4.0–10.5)
nRBC: 3.6 % — ABNORMAL HIGH (ref 0.0–0.2)

## 2024-05-26 LAB — MAGNESIUM: Magnesium: 2.1 mg/dL (ref 1.7–2.4)

## 2024-05-26 LAB — BRAIN NATRIURETIC PEPTIDE: B Natriuretic Peptide: 1815.1 pg/mL — ABNORMAL HIGH (ref 0.0–100.0)

## 2024-05-26 LAB — PROCALCITONIN: Procalcitonin: 0.12 ng/mL

## 2024-05-26 LAB — C-REACTIVE PROTEIN: CRP: 8.8 mg/dL — ABNORMAL HIGH (ref <1.0)

## 2024-05-26 MED ORDER — NITROGLYCERIN 2 % TD OINT: 0.5000 [in_us] | TOPICAL_OINTMENT | TRANSDERMAL | Status: DC | PRN

## 2024-05-26 MED ORDER — ONDANSETRON 4 MG PO TBDP: 4.0000 mg | ORAL_TABLET | Freq: Four times a day (QID) | ORAL | Status: DC | PRN

## 2024-05-26 MED ORDER — HALOPERIDOL LACTATE 5 MG/ML IJ SOLN: 0.5000 mg | INTRAMUSCULAR | Status: DC | PRN

## 2024-05-26 MED ORDER — MORPHINE SULFATE (PF) 2 MG/ML IV SOLN
1.0000 mg | Freq: Once | INTRAVENOUS | Status: AC
  Administered 2024-05-26: 1 mg via INTRAVENOUS
  Filled 2024-05-26: qty 1

## 2024-05-26 MED ORDER — POLYVINYL ALCOHOL 1.4 % OP SOLN: 1.0000 [drp] | Freq: Four times a day (QID) | OPHTHALMIC | Status: DC | PRN

## 2024-05-26 MED ORDER — ONDANSETRON HCL 4 MG/2ML IJ SOLN
4.0000 mg | Freq: Four times a day (QID) | INTRAMUSCULAR | Status: DC | PRN
  Administered 2024-06-02 – 2024-06-03 (×2): 4 mg via INTRAVENOUS
  Filled 2024-05-26 (×3): qty 2

## 2024-05-26 MED ORDER — GLYCOPYRROLATE 0.2 MG/ML IJ SOLN: 0.2000 mg | INTRAMUSCULAR | Status: DC | PRN

## 2024-05-26 MED ORDER — HYDROMORPHONE HCL 1 MG/ML IJ SOLN
0.2500 mg | INTRAMUSCULAR | Status: DC | PRN
  Administered 2024-05-26 – 2024-05-28 (×6): 0.5 mg via INTRAVENOUS
  Filled 2024-05-26 (×6): qty 0.5

## 2024-05-26 MED ORDER — GLYCOPYRROLATE 1 MG PO TABS: 1.0000 mg | ORAL_TABLET | ORAL | Status: DC | PRN

## 2024-05-26 MED ORDER — HALOPERIDOL LACTATE 2 MG/ML PO CONC: 0.5000 mg | ORAL | Status: DC | PRN

## 2024-05-26 MED ORDER — DIPHENHYDRAMINE HCL 50 MG/ML IJ SOLN
12.5000 mg | INTRAMUSCULAR | Status: DC | PRN
  Administered 2024-05-30 – 2024-06-03 (×6): 12.5 mg via INTRAVENOUS
  Filled 2024-05-26 (×6): qty 1

## 2024-05-26 MED ORDER — PANTOPRAZOLE SODIUM 40 MG IV SOLR: 40.0000 mg | Freq: Two times a day (BID) | INTRAVENOUS | Status: DC | PRN

## 2024-05-26 MED ORDER — SENNA 8.6 MG PO TABS: 1.0000 | ORAL_TABLET | Freq: Every evening | ORAL | Status: DC | PRN

## 2024-05-26 MED ORDER — BISACODYL 10 MG RE SUPP: 10.0000 mg | Freq: Every day | RECTAL | Status: DC | PRN

## 2024-05-26 MED ORDER — LIDOCAINE 5 % EX PTCH
1.0000 | MEDICATED_PATCH | CUTANEOUS | Status: DC
  Administered 2024-05-26 – 2024-06-03 (×5): 1 via TRANSDERMAL
  Filled 2024-05-26 (×6): qty 1

## 2024-05-26 MED ORDER — METHOCARBAMOL 1000 MG/10ML IJ SOLN: 500.0000 mg | Freq: Three times a day (TID) | INTRAMUSCULAR | Status: DC | PRN

## 2024-05-26 MED ORDER — BIOTENE DRY MOUTH MT LIQD: 15.0000 mL | OROMUCOSAL | Status: DC | PRN

## 2024-05-26 MED ORDER — HALOPERIDOL 0.5 MG PO TABS: 0.5000 mg | ORAL_TABLET | ORAL | Status: DC | PRN

## 2024-05-26 NOTE — Progress Notes (Signed)
° °   ° °  RE:  Cassidy Larson  Date of Birth:  [birth year]  Date:  06/05/2024    To Whom It May Concern:  Please be advised that the above-named patient will require a short-term nursing home stay - anticipated 30 days or less for rehabilitation and strengthening.  The plan is for return home.

## 2024-05-26 NOTE — Progress Notes (Addendum)
 Daily Progress Note   Date: 05/26/2024   Patient Name: Cassidy Larson  DOB: 10/09/1955  MRN: [id number]  Age / Sex: 68 y.o., female  Attending Physician: Sherlon Brayton RAMAN, MD Primary Care Physician: [name], FNP Admit Date: 05/22/2024 Length of Stay: 4 days  Reason for Follow-up: Establishing goals of care, Non pain symptom management, Pain control, and Terminal Care  Past Medical History:  Diagnosis Date   Anxiety    Blood transfusion without reported diagnosis    CAD in native artery residual post LAD stent, 100% RCA and 80% LCx.  03/30/2022   CHF (congestive heart failure) (HCC)    CKD (chronic kidney disease) stage 4, GFR 15-29 ml/min (HCC)    Cocaine abuse (HCC)    Essential hypertension    HLD (hyperlipidemia) 03/30/2022   Lupus    On mechanically assisted ventilation (HCC) extubated 03/28/22  03/28/2022   Polysubstance abuse (HCC)    Positive ANA (antinuclear antibody)    S/P angioplasty with stent to mLAD 03/27/22 03/30/2022    Assessment & Plan:   HPI/Patient Profile:   Maricella Filyaw is a 68 y.o. female with medical history significant of ASCVD and MI status post PCI October 2023, RA, hypertension, hyperlipidemia, CKD 3B, SLE. Admitted with chest pain and difficulty breathing following a recent hospitalization for pneumonia. Palliative care has been asked to support additional goals of care conversations in the setting of sepsis.   SUMMARY OF RECOMMENDATIONS DNR-comfort Continue to monitor for placement inpatient hospice vs in hospital death 0.25-0.5 Dilaudid  PRN Q2 for pain/dyspnea Symptom management as below  Symptom Management:  Dilaudid  pain/dyspnea/increased work of breathing/RR>25 Tylenol  PRN pain/fever Biotin PRN daily Benadryl PRN itching Robinul PRN secretions Haldol  PRN agitation/delirium Ativan  PRN anxiety/seizure/sleep/distress Zofran  PRN nausea/vomiting Liquifilm Tears PRN dry eye   Code Status: DNR -  Comfort  Prognosis: < 2 weeks  Discharge Planning: To Be Determined  Discussed with: Elgergawy MD about patient's pain management as well as patient's decision to transition to full comfort measures.   Subjective:   Subjective: Chart Reviewed. Updates received. Patient Assessed. Created space and opportunity for patient  and family to explore thoughts and feelings regarding current medical situation.  Reviewed palliative note by [facility] NP on 05/24/2024 and patient shared that she expressed readiness to meet her lord when its her time.   Today's Discussion:  Requested by Elgergawy MD to assess patient for persistent chest pain refractory to morphine  and nitroglycerin . Discussed with patient that her overall prognosis is poor and that we can shift focus to comfort measures to address her discomfort. Shared that a full transition to comfort measures or hospice will mean that we no longer continue aggressive medical interventions but to focus on symptom management which would mean that the time she has left to live is short. Patient shared that she is ready to proceed and wants the pain to go away. Inquired if it was true that patient shared that she was ready to meet her lord regarding her prior conversations with [facility] NP, patient said she was ready.   Review of Systems  Respiratory:  Positive for chest tightness.     Objective:   Primary Diagnoses: Present on Admission:  Sepsis due to pneumonia (HCC)  Chest pain  Elevated troponin  Transient hypotension  Acute on chronic systolic CHF (congestive heart failure) (HCC)  Acute respiratory failure with hypoxia (HCC)  Normocytic anemia  CKD (chronic kidney disease), stage III (HCC)  CAD in native artery residual post LAD stent,  100% RCA and 80% LCx.   HLD (hyperlipidemia)  Tobacco abuse  Prolonged QT interval  Dysthymic disorder  Lupus (systemic lupus erythematosus) (HCC)   Vital Signs:  BP 107/75 (BP Location:  Right Arm)   Pulse 98   Temp 98.3 F (36.8 C) (Oral)   Resp 15   Ht 5' (1.524 m)   Wt 45.7 kg   SpO2 96%   BMI 19.68 kg/m   Physical Exam Constitutional:      Appearance: She is ill-appearing.     Comments: Cachectic, difficult to maintain alertness needing repeated redirection.   HENT:     Head: Normocephalic.  Cardiovascular:     Rate and Rhythm: Normal rate.  Pulmonary:     Effort: Tachypnea present.  Chest:     Chest wall: Tenderness present.  Skin:    General: Skin is warm.  Neurological:     General: No focal deficit present.  Psychiatric:     Comments: Depressed.    Palliative Assessment/Data: 20%   Existing Vynca/ACP Documentation: None  Thank you for allowing us  to participate in the care of Milianna Ericsson PMT will continue to support holistically.  I personally spent a total of 50 minutes in the care of the patient today including preparing to see the patient, getting/reviewing separately obtained history, performing a medically appropriate exam/evaluation, counseling and educating, placing orders, referring and communicating with other health care professionals, and documenting clinical information in the EHR.   [name]  Palliative Medicine Team  Team Phone # [phone] (Nights/Weekends) 05/26/2024 5:06 PM

## 2024-05-26 NOTE — Plan of Care (Signed)

## 2024-05-26 NOTE — TOC Progression Note (Signed)
 Transition of Care [facility]) - Progression Note    Patient Details  Name: Lorree Millar MRN: [id number] Date of Birth: Aug 22, 1955  Transition of Care [facility]) CM/SW Contact  [name], [place] Phone Number: 05/26/2024, 11:12 AM  Clinical Narrative:     Disposition: SNF vs Hospice ICM will continue to follow allowing time for outcomes.        Social Drivers of Health (SDOH) Interventions SDOH Screenings   Food Insecurity: No Food Insecurity (05/22/2024)  Housing: Unknown (05/22/2024)  Transportation Needs: No Transportation Needs (05/22/2024)  Recent Concern: Transportation Needs - Unmet Transportation Needs (05/15/2024)  Utilities: Not At Risk (05/22/2024)  Depression (PHQ2-9): Low Risk  (03/09/2019)  Financial Resource Strain: Not on File (11/14/2022)   Received from [facility]  Physical Activity: Not on File (11/14/2022)   Received from [facility]  Social Connections: Socially Isolated (05/22/2024)  Stress: Not on File (11/14/2022)   Received from [facility]  Tobacco Use: High Risk (05/15/2024)    Readmission Risk Interventions    05/16/2024    2:59 PM  Readmission Risk Prevention Plan  Transportation Screening Complete  PCP or Specialist Appt within 5-7 Days Not Complete  Home Care Screening Complete  Medication Review (RN CM) Complete

## 2024-05-26 NOTE — Progress Notes (Signed)
 PROGRESS NOTE     Patient Demographics:    Cassidy Larson, is a 68 y.o. female, DOB - 1955/09/27, FMW:[id number]  Outpatient Primary MD for the patient is [name], FNP    LOS - 4  Admit date - 05/22/2024    Chief Complaint  Patient presents with   Chest Pain       Brief Narrative (HPI from H&P)    68 y.o. 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   She was  just recently been hospitalized from 11/29-12/4 with concern for sepsis thought possibly secondary to a left-sided bacterial community-acquired pneumonia.  Blood cultures were negative and patient was treated with IV cefepime  and transition to oral Augmentin  to complete a 5-day course at discharge. Records note urine Legionella cultures were positive from admission the last admission.   In the ER her workup was consistent with NSTEMI, CHF with acute hypoxic respiratory failure, possible worsening pneumonia and she was admitted to the hospital for    Subjective:   Patient in bed, appears uncomfortable, complaining of chest pain, has been requesting multiple doses of pain regimen due to discomfort .   Assessment  & Plan :   Acute respiratory failure with hypoxia Sepsis due to community-acquired pneumonia, recent Legionella pneumonia - Patient was recently diagnosed with Legionella pneumonia, CT scan shows worsening pulmonary edema and pneumonia, will be placed on azithromycin  along with Augmentin , azithromycin  total 7 days, Augmentin  x 5,  -She is DNR and not a  candidate for aggressive invasive procedures or treatment modalities.  - She is extremely frail, deconditioned, significant discomfort, still requiring 5 L nasal cannula, dyspneic with significant amount of musculoskeletal chest pain.  NSTEMI Acute on chronic diastolic congestive heart failure exacerbation Suspected acute on chronic.  CTA of the chest noted concern for pulmonary edema with signs of right heart failure.  Echo noted with EF 60% and stable wall motion as reported. - NSTEMI, likely type II in  the setting of respiratory failure, hypoxia with underlying CAD. - Finished heparin  drip x 48 hours. 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   -Anemia panel suggestive of iron deficiency, placed on replacement, on PPI no signs of ongoing bleeding, poor candidate for EGD or colonoscopy at this time    Prolonged QT interval Chronic.  QTc 512 - Monitor electrolytes, minimize QT prolonging medications as much as practically possible, due to recent Legionella pneumonia and worsening pneumonia on CT despite treatment before azithromycin  x 7 days.   Systemic lupus Review of records note patient with a history of positive ANA back in 2019.  Previously had been on Plaquenil   Mixed hyperlipidemia - Continue Lipitor    Dysthymic disorder - Continue Zoloft  but held mirtazapine   Severe mitral valve regurgitation -Not a surgical candidate  Elevated liver enzymes Acute.  AST 47 and ALT 52.  Possibly secondary to passive congestion from patient being fluid overload. - Continue to monitor   Tobacco  abuse Patient just recently stopped smoking in the last week. - Continue to encourage cessation of tobacco use  Incidental finding on CT.  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 Consider follow-up imaging with repeat CT of the chest in 1 to 3 months to ensure stability and/or resolution.  PCP to monitor.   Goals of care discussion - Patient with very poor life quality, severely deconditioned, at this point she is currently dyspneic, uncomfortable with recurrent chest pain, though unlikely cardiac, she is requesting multiple doses of pain regimen due to discomfort, dyspnea, palliative care involved, patient tells me today that she is so miserable, and still reports significant amount of dyspnea, chest discomfort despite as needed morphine  palliative medicine reengaged today, her pain regimen will be increased, and seems to be appropriate for hospice at this point.      Condition - Extremely Guarded  Family Communication  : No family, legal guardian listed as Mrs. Ascencion [phone], she was called on 05/23/2024 at 8:30 AM, she informs me that she is not a legal guardian or POA she is simply a production designer, theatre/television/film of the facility where patient lives, she confirms patient has no family and decides for herself.  Code Status : DNR  Consults  : Cardiology, palliative care  PUD Prophylaxis : PPI.   Procedures  :     TTE -  1. Left ventricular ejection fraction, by estimation, is 60 to 65%. The left ventricle has normal function. The left ventricle has no regional wall motion abnormalities. Left ventricular diastolic parameters are indeterminate.  2. Right ventricular systolic function is normal. The right ventricular size is normal.  3. Shadowing artifact in LA from MAC. Restricted posterior leaflet motion. Consider f/u TEE to further evaluate the degree of MR and MV morphology. The mitral valve is abnormal. Severe mitral valve regurgitation. No evidence of mitral stenosis.  Severe mitral annular calcification.  4. Tricuspid valve regurgitation is mild to moderate.  5. The aortic valve is normal in structure. Aortic valve regurgitation is not visualized. No aortic stenosis is present.  6. The inferior vena cava is normal in size with greater  than 50% respiratory variability, suggesting right atrial pressure of 3 mmHg.  CT - 1. No evidence of acute aortic syndrome. 2. Progressive left upper lobe pneumonia. 3. Diffuse pulmonary edema and small left pleural effusion. Reflux of contrast material into the IVC and hepatic veins consistent with right heart failure. 4. No central pulmonary arterial occlusion. 5. Enlarging mediastinal lymph nodes are nonspecific in the setting of chf and pneumonia. 6. 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 Consider follow-up imaging with repeat CT of the chest in 1 to 3 months to ensure stability and/or resolution. 7. Aortic atherosclerotic calcifications and coronary artery calcifications. 8. Progressive increased size of bilateral inguinal and left external iliac lymph nodes compared with 05/17/2024. Nonspecific but favor reactive changes.      Disposition Plan  :    Status is: Inpatient  DVT Prophylaxis  :  Hep gtt   Lab Results  Component Value Date   PLT 424 (H) 05/26/2024    Diet :  Diet Order             DIET DYS 3 Room service appropriate? Yes; Fluid consistency: Thin  Diet effective now                    Inpatient Medications  Scheduled Meds:  atorvastatin   40 mg Oral Daily   feeding supplement  237 mL Oral BID BM   Gerhardt's butt cream   Topical BID   guaiFENesin   600 mg Oral BID   lidocaine   1 patch Transdermal Q24H   ranolazine   500 mg Oral BID   sertraline   100 mg Oral Daily   sodium chloride  flush  3 mL Intravenous Q12H   Continuous Infusions:   PRN Meds:.acetaminophen  **OR** acetaminophen , albuterol , alum & mag  hydroxide-simeth, antiseptic oral rinse, artificial tears, bisacodyl, diphenhydrAMINE, glycopyrrolate **OR** glycopyrrolate **OR** glycopyrrolate, haloperidol  **OR** haloperidol  **OR** haloperidol  lactate, HYDROmorphone  (DILAUDID ) injection, loperamide , methocarbamol  (ROBAXIN ) injection, morphine  injection, nitroGLYCERIN , nitroGLYCERIN , ondansetron  **OR** ondansetron  (ZOFRAN ) IV, pantoprazole  (PROTONIX ) IV, polyethylene glycol, senna, trimethobenzamide     Objective:   Vitals:   05/26/24 0400 05/26/24 0734 05/26/24 0738 05/26/24 1200  BP: 95/72  112/75 107/75  Pulse: 90  92 98  Resp: 15  20 15   Temp: 98.6 F (37 C) 97.9 F (36.6 C) 97.9 F (36.6 C) 98.3 F (36.8 C)  TempSrc: Axillary Oral Oral Oral  SpO2: 96%  98% 96%  Weight: 45.7 kg     Height:        Wt Readings from Last 3 Encounters:  05/26/24 45.7 kg  05/15/24 45.2 kg  10/20/22 43.5 kg     Intake/Output Summary (Last 24 hours) at 05/26/2024 1521 Last data filed at 05/26/2024 0902 Gross per 24 hour  Intake 6 ml  Output 300 ml  Net -294 ml     Physical Exam  Awake, alert, extremely frail, deconditioned, ill-appearing  Mildly tachypneic with rales, remains on 5 to 6 L humidified nasal cannula  Regular rate and rhythm, significant reproducible chest pain on palpation  Abdomen soft  Extremities with no edema        Data Review:    Recent Labs  Lab 05/22/24 0731 05/22/24 1304 05/24/24 0739 05/25/24 0407 05/26/24 0407  WBC 10.7* 10.8* 17.4* 15.6* 16.8*  HGB 9.1* 8.8* 8.4* 8.4* 9.3*  HCT 28.6* 26.6* 25.6* 25.2* 28.3*  PLT 284 265 367 378 424*  MCV 93.8 92.7 91.8 92.6 93.7  MCH 29.8 30.7 30.1  30.9 30.8  MCHC 31.8 33.1 32.8 33.3 32.9  RDW 13.8 13.8 14.5 14.3 14.7  LYMPHSABS 1.6 1.1 0.9 1.5 1.5  MONOABS 1.1* 1.0 1.2* 1.4* 1.8*  EOSABS 0.5 0.2 0.3 0.3 0.2  BASOSABS 0.0 0.0 0.0 0.0 0.1    Recent Labs  Lab 05/22/24 0732 05/22/24 1104 05/22/24 1304 05/23/24 0749 05/24/24 0739 05/25/24 0407  05/26/24 0407  NA 140  --   --  141 139 140 141  K 3.7  --   --  4.1 4.2 3.9 4.4  CL 110  --   --  118* 113* 114* 114*  CO2 17*  --   --  14* 15* 12* 19*  ANIONGAP 13  --   --  9 11 14 8   GLUCOSE 168*  --   --  120* 108* 148* 126*  BUN 22  --   --  21 26* 34* 43*  CREATININE 1.54*  --   --  1.51* 1.67* 1.71* 1.80*  AST 47*  --   --   --   --   --   --   ALT 52*  --   --   --   --   --   --   ALKPHOS 105  --   --   --   --   --   --   BILITOT 0.3  --   --   --   --   --   --   ALBUMIN 3.2*  --   --   --   --   --   --   CRP  --   --   --  10.2* 10.8* 11.1* 8.8*  PROCALCITON  --   --  0.17 0.22 0.15 0.12 0.12  LATICACIDVEN  --   --  1.0  --   --   --   --   INR 1.3*  --   --   --   --   --   --   HGBA1C 5.8*  --   --   --   --   --   --   BNP  --  801.9*  --  1,974.0* 2,005.7* 1,432.5* 1,815.1*  MG  --   --   --  2.3 2.1 1.8 2.1  CALCIUM  8.3*  --   --  7.8* 7.9* 8.1* 8.4*      Recent Labs  Lab 05/22/24 0732 05/22/24 1104 05/22/24 1304 05/23/24 0749 05/24/24 0739 05/25/24 0407 05/26/24 0407  CRP  --   --   --  10.2* 10.8* 11.1* 8.8*  PROCALCITON  --   --  0.17 0.22 0.15 0.12 0.12  LATICACIDVEN  --   --  1.0  --   --   --   --   INR 1.3*  --   --   --   --   --   --   HGBA1C 5.8*  --   --   --   --   --   --   BNP  --  801.9*  --  1,974.0* 2,005.7* 1,432.5* 1,815.1*  MG  --   --   --  2.3 2.1 1.8 2.1  CALCIUM  8.3*  --   --  7.8* 7.9* 8.1* 8.4*    --------------------------------------------------------------------------------------------------------------- Lab Results  Component Value Date   CHOL 95 05/22/2024   HDL 18 (L) 05/22/2024   LDLCALC 43 05/22/2024   TRIG 173 (H) 05/22/2024   CHOLHDL 5.3 05/22/2024  Lab Results  Component Value Date   HGBA1C 5.8 (H) 05/22/2024   No results for input(s): TSH, T4TOTAL, FREET4, T3FREE, THYROIDAB in the last 72 hours. Recent Labs    05/24/24 1020  VITAMINB12 441  FOLATE 11.3  FERRITIN 182  TIBC 206*   IRON 26*  RETICCTPCT 2.3   ------------------------------------------------------------------------------------------------------------------ Cardiac Enzymes No results for input(s): CKMB, TROPONINI, MYOGLOBIN in the last 168 hours.  Invalid input(s): CK  Micro Results Recent Results (from the past 240 hours)  MRSA Next Gen by PCR, Nasal     Status: None   Collection Time: 05/22/24  7:03 AM  Result Value Ref Range Status   MRSA by PCR Next Gen NOT DETECTED NOT DETECTED Final    Comment: (NOTE) The GeneXpert MRSA Assay (FDA approved for NASAL specimens only), is one component of a comprehensive MRSA colonization surveillance program. It is not intended to diagnose MRSA infection nor to guide or monitor treatment for MRSA infections. Test performance is not FDA approved in patients less than 53 years old. Performed at [facility], [address] [place], [place] [address]   Blood culture (routine x 2)     Status: None (Preliminary result)   Collection Time: 05/22/24 10:55 AM   Specimen: BLOOD RIGHT ARM  Result Value Ref Range Status   Specimen Description BLOOD RIGHT ARM  Final   Special Requests   Final    BOTTLES DRAWN AEROBIC AND ANAEROBIC Blood Culture results may not be optimal due to an inadequate volume of blood received in culture bottles   Culture   Final    NO GROWTH 4 DAYS Performed at [facility], [address], [place], [place] [address]    Report Status PENDING  Incomplete  Blood culture (routine x 2)     Status: None (Preliminary result)   Collection Time: 05/22/24 11:00 AM   Specimen: BLOOD LEFT ARM  Result Value Ref Range Status   Specimen Description BLOOD LEFT ARM  Final   Special Requests   Final    BOTTLES DRAWN AEROBIC AND ANAEROBIC Blood Culture results may not be optimal due to an inadequate volume of blood received in culture bottles   Culture   Final    NO GROWTH 4 DAYS Performed at [facility], [address] [place], [place] [address]    Report Status PENDING  Incomplete    Radiology Report No results found.    Signature  -   Brayton Lye M.D on 05/26/2024 at 3:21 PM   -  To page go to [email or web address]

## 2024-05-26 NOTE — Plan of Care (Signed)
  Problem: Clinical Measurements: Goal: Quality of life will improve Outcome: Progressing   Problem: Respiratory: Goal: Verbalizations of increased ease of respirations will increase Outcome: Progressing   Problem: Pain Management: Goal: Satisfaction with pain management regimen will improve Outcome: Progressing   

## 2024-05-26 NOTE — NC FL2 (Signed)
 [place]  MEDICAID FL2 LEVEL OF CARE FORM     IDENTIFICATION  Patient Name: Cassidy Larson Birthdate: 12-15-1955 Sex: female Admission Date (Current Location): 05/22/2024  [facility] [place] Number:  Producer, Television/film/video and Address:  [facility] [place], [address] [place], [place] [address]      Provider Number: [id number]  Attending Physician Name and Address:  Sherlon Brayton RAMAN, MD  Relative Name and Phone Number:  Ascencion Drones [phone]    Current Level of Care: Hospital Recommended Level of Care: Skilled Nursing Facility Prior Approval Number:    Date Approved/Denied:   PASRR Number:    Discharge Plan: SNF    Current Diagnoses: Patient Active Problem List   Diagnosis Date Noted   Elevated troponin 05/22/2024   Acute on chronic systolic CHF (congestive heart failure) (HCC) 05/22/2024   Normocytic anemia 05/22/2024   Prolonged QT interval 05/22/2024   Lupus (systemic lupus erythematosus) (HCC) 05/22/2024   Sepsis due to pneumonia (HCC) 05/15/2024   Prediabetes 05/21/2023   Subclinical hyperthyroidism 05/21/2023   History of MI (myocardial infarction) 03/20/2023   Memory difficulties 03/20/2023   Poor social situation 03/20/2023   Protein-calorie malnutrition, severe 07/06/2022   Cough 07/06/2022   Nasal congestion 07/06/2022   CKD (chronic kidney disease), stage III (HCC) 07/05/2022   Elevated liver enzymes 07/03/2022   Norovirus 07/03/2022   Chest pain 04/20/2022   Transient hypotension 04/19/2022   CAD in native artery residual post LAD stent, 100% RCA and 80% LCx.  03/30/2022   S/P angioplasty with stent to mLAD 03/27/22 03/30/2022   HLD (hyperlipidemia) 03/30/2022   Malnutrition of moderate degree 03/30/2022   On mechanically assisted ventilation (HCC) extubated 03/28/22  03/28/2022   Acute respiratory failure with hypoxia (HCC) 03/28/2022   Cocaine abuse (HCC) 03/28/2022   Tobacco abuse 03/28/2022   Acute ST  elevation myocardial infarction (STEMI) due to occlusion of left anterior descending (LAD) coronary artery (HCC)    Positive ANA (antinuclear antibody) 08/25/2017   CKD (chronic kidney disease) stage 4, GFR 15-29 ml/min (HCC) 08/18/2017   CKD (chronic kidney disease) 08/18/2017   Hypertension 08/17/2017   Headache 08/17/2017   Substance abuse (HCC) 08/17/2017   Essential hypertension 08/17/2017   Dysthymic disorder 07/13/2013   Polysubstance abuse (HCC) 07/13/2013    Orientation RESPIRATION BLADDER Height & Weight     Self, Time, Situation, Place  O2 (5L nasal cannula) Incontinent Weight: 100 lb 12 oz (45.7 kg) Height:  5' (152.4 cm)  BEHAVIORAL SYMPTOMS/MOOD NEUROLOGICAL BOWEL NUTRITION STATUS      Incontinent Diet (see d/c summary)  AMBULATORY STATUS COMMUNICATION OF NEEDS Skin   Extensive Assist Verbally Normal                       Personal Care Assistance Level of Assistance  Bathing, Feeding, Dressing Bathing Assistance: Maximum assistance   Dressing Assistance: Limited assistance     Functional Limitations Info  Sight, Hearing, Speech Sight Info: Adequate Hearing Info: Adequate Speech Info: Adequate    SPECIAL CARE FACTORS FREQUENCY  OT (By licensed OT), PT (By licensed PT)     PT Frequency: 5x/week OT Frequency: 5x/week            Contractures Contractures Info: Not present    Additional Factors Info  Code Status, Allergies Code Status Info: DNR-limited Allergies Info: Cotton fabric - rash, Asa (Aspirin ), Latex           Current Medications (05/26/2024):  This  is the current hospital active medication list Current Facility-Administered Medications  Medication Dose Route Frequency Provider Last Rate Last Admin   acetaminophen  (TYLENOL ) tablet 650 mg  650 mg Oral Q6H PRN Smith, Rondell A, MD       Or   acetaminophen  (TYLENOL ) suppository 650 mg  650 mg Rectal Q6H PRN Claudene Maximino LABOR, MD       albuterol  (PROVENTIL ) (2.5 MG/3ML) 0.083%  nebulizer solution 2.5 mg  2.5 mg Nebulization Q2H PRN Smith, Rondell A, MD       alum & mag hydroxide-simeth (MAALOX/MYLANTA) 200-200-20 MG/5ML suspension 30 mL  30 mL Oral Q6H PRN Singh, Prashant K, MD       atorvastatin  (LIPITOR ) tablet 40 mg  40 mg Oral Daily Claudene, Rondell A, MD   40 mg at 05/26/24 9156   azithromycin  (ZITHROMAX ) tablet 500 mg  500 mg Oral Daily Singh, Prashant K, MD   500 mg at 05/26/24 9156   docusate sodium  (COLACE) capsule 200 mg  200 mg Oral Daily Singh, Prashant K, MD       doxycycline  (VIBRA -TABS) tablet 100 mg  100 mg Oral Q12H Singh, Prashant K, MD   100 mg at 05/26/24 0843   feeding supplement (ENSURE PLUS HIGH PROTEIN) liquid 237 mL  237 mL Oral BID BM Singh, Prashant K, MD   237 mL at 05/26/24 0845   ferrous sulfate  tablet 325 mg  325 mg Oral BID WC Singh, Prashant K, MD   325 mg at 05/25/24 1831   folic acid  (FOLVITE ) tablet 1 mg  1 mg Oral Daily Singh, Prashant K, MD   1 mg at 05/26/24 9156   Gerhardt's butt cream   Topical BID Smith, Rondell A, MD   Given at 05/26/24 9153   guaiFENesin  (MUCINEX ) 12 hr tablet 600 mg  600 mg Oral BID Smith, Rondell A, MD   600 mg at 05/26/24 0844   heparin  injection 5,000 Units  5,000 Units Subcutaneous Q8H Singh, Prashant K, MD   5,000 Units at 05/26/24 0500   lidocaine  (LIDODERM ) 5 % 1 patch  1 patch Transdermal Q24H Segal, Jared E, DO   1 patch at 05/26/24 0630   loperamide  (IMODIUM ) capsule 2 mg  2 mg Oral Q6H PRN Singh, Prashant K, MD   2 mg at 05/25/24 2140   morphine  (PF) 2 MG/ML injection 1 mg  1 mg Intravenous Q4H PRN Singh, Prashant K, MD   1 mg at 05/26/24 0456   nitroGLYCERIN  (NITROGLYN) 2 % ointment 0.5 inch  0.5 inch Topical Q6H Singh, Prashant K, MD   0.5 inch at 05/26/24 0500   nitroGLYCERIN  (NITROSTAT ) SL tablet 0.4 mg  0.4 mg Sublingual Q5 min PRN Singh, Prashant K, MD   0.4 mg at 05/25/24 1531   pantoprazole  (PROTONIX ) EC tablet 40 mg  40 mg Oral BID AC Singh, Prashant K, MD   40 mg at 05/26/24 0843    polyethylene glycol (MIRALAX  / GLYCOLAX ) packet 17 g  17 g Oral Daily PRN Singh, Prashant K, MD       ranolazine  (RANEXA ) 12 hr tablet 500 mg  500 mg Oral BID Singh, Prashant K, MD   500 mg at 05/26/24 9156   sertraline  (ZOLOFT ) tablet 100 mg  100 mg Oral Daily Smith, Rondell A, MD   100 mg at 05/26/24 0843   sodium chloride  flush (NS) 0.9 % injection 3 mL  3 mL Intravenous Q12H Claudene Maximino A, MD   3 mL at 05/26/24 [phone]  ticagrelor  (BRILINTA ) tablet 90 mg  90 mg Oral BID Smith, Rondell A, MD   90 mg at 05/26/24 9155   trimethobenzamide  (TIGAN ) injection 200 mg  200 mg Intramuscular Q6H PRN Smith, Rondell A, MD         Discharge Medications: Please see discharge summary for a list of discharge medications.  Relevant Imaging Results:  Relevant Lab Results:   Additional Information SSN: [id number]  [name], LCSW

## 2024-05-27 LAB — CULTURE, BLOOD (ROUTINE X 2)
Culture: NO GROWTH
Culture: NO GROWTH

## 2024-05-27 LAB — LEGIONELLA PNEUMOPHILA SEROGP 1 UR AG: L. pneumophila Serogp 1 Ur Ag: POSITIVE — AB

## 2024-05-27 MED ORDER — PHENOL 1.4 % MT LIQD
1.0000 | OROMUCOSAL | Status: DC | PRN
  Filled 2024-05-27: qty 177

## 2024-05-27 NOTE — Progress Notes (Signed)
 PROGRESS NOTE     Patient Demographics:    Cassidy Larson, is a 68 y.o. female, DOB - 23-Feb-1956, FMW:[id number]  Outpatient Primary MD for the patient is [name], FNP    LOS - 5  Admit date - 05/22/2024    Chief Complaint  Patient presents with   Chest Pain       Brief Narrative (HPI from H&P)     68 y.o. 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   She was  just recently been hospitalized from 11/29-12/4 with concern for sepsis thought possibly secondary to a left-sided bacterial community-acquired pneumonia.  Blood cultures were negative and patient was treated with IV cefepime  and transition to oral Augmentin  to complete a 5-day course at discharge. Records note urine Legionella cultures were positive from admission the last admission.   In the ER her workup was consistent with NSTEMI, CHF with acute hypoxic respiratory failure, possible worsening pneumonia and she was admitted to the hospital for    Subjective:   Patient in bed, appears more comfortable today, somnolent, reports chest pain has improved, but still reports I am aching all over, has poor appetite as discussed with staff   Assessment  & Plan :   Acute respiratory failure with hypoxia Sepsis due to community-acquired pneumonia, recent Legionella pneumonia - Patient was recently diagnosed with Legionella pneumonia, CT scan shows worsening pulmonary edema and pneumonia. - Treated with appropriate antibiotic azithromycin  and Augmentin . - Encouraged use  incentive spirometry and flutter valve. - Still with significant oxygen requirement, still 5 to 6 L nasal cannula.  NSTEMI Acute on chronic diastolic congestive heart failure exacerbation -With evidence of volume overload, suspected acute on chronic.  - CTA of the chest noted concern for pulmonary edema with signs of right heart failure.  Echo noted with EF 60% and stable wall motion as reported. - NSTEMI, likely type II in the setting of respiratory failure, hypoxia with underlying CAD. -  Finished heparin  drip x 48 hours. 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   -Anemia panel suggestive of iron deficiency, placed on replacement, on PPI no signs of ongoing bleeding, poor candidate for EGD or colonoscopy at this time    Prolonged QT interval Chronic.  QTc 512 - Monitor electrolytes, minimize QT prolonging medications as much as practically possible, due to recent Legionella pneumonia and worsening pneumonia on CT despite treatment before azithromycin  x 7 days.   Systemic lupus Review of records note patient with a history of positive ANA back in 2019.  Previously had been on Plaquenil   Mixed hyperlipidemia - Continue Lipitor    Dysthymic disorder - Continue Zoloft  but held mirtazapine   Severe mitral valve regurgitation -Not a surgical candidate  Elevated liver enzymes Acute.  AST 47 and ALT 52.  Possibly secondary to passive congestion from patient being fluid overload. - Continue to monitor   Tobacco abuse Patient just recently stopped smoking in the last week. - Continue to encourage  cessation of tobacco use  Incidental finding on CT.  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 Consider follow-up imaging with repeat CT of the chest in 1 to 3 months to ensure stability and/or resolution.  PCP to monitor.   Goals of care discussion - Patient with very poor life quality, severely deconditioned, at this point she is currently dyspneic, uncomfortable with recurrent chest pain, though unlikely cardiac, she is requesting multiple doses of pain regimen due to discomfort, dyspnea, palliative care involved, medicine input greatly appreciated, no family could be located, but patient was clear about her decisions not to pursue any aggressive measures, at this point wants to focus on comfort, currently she is approaching hospice, she is significantly dyspneic, hypoxic, frail, with poor appetite and oral intake, failure to thrive.       Condition - Extremely Guarded  Family Communication  : No family, legal guardian listed as Mrs. Ascencion [phone], she was called on 05/23/2024 at 8:30 AM, she informs me that she is not a legal guardian or POA she is simply a production designer, theatre/television/film of the facility where patient lives, she confirms patient has no family and decides for herself.  Code Status : DNR  Consults  : Cardiology, palliative care  PUD Prophylaxis : PPI.   Procedures  :     TTE -  1. Left ventricular ejection fraction, by estimation, is 60 to 65%. The left ventricle has normal function. The left ventricle has no regional wall motion abnormalities. Left ventricular diastolic parameters are indeterminate.  2. Right ventricular systolic function is normal. The right ventricular size is normal.  3. Shadowing artifact in LA from MAC. Restricted posterior leaflet motion. Consider f/u TEE to further evaluate the degree of MR and MV morphology. The mitral valve is abnormal. Severe mitral valve regurgitation. No evidence of mitral stenosis. Severe mitral annular  calcification.  4. Tricuspid valve regurgitation is mild to moderate.  5. The aortic valve is normal in structure. Aortic valve regurgitation is not visualized. No aortic stenosis is present.  6. The inferior vena cava is normal in size with greater than  50% respiratory variability, suggesting right atrial pressure of 3 mmHg.  CT - 1. No evidence of acute aortic syndrome. 2. Progressive left upper lobe pneumonia. 3. Diffuse pulmonary edema and small left pleural effusion. Reflux of contrast material into the IVC and hepatic veins consistent with right heart failure. 4. No central pulmonary arterial occlusion. 5. Enlarging mediastinal lymph nodes are nonspecific in the setting of chf and pneumonia. 6. 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 Consider follow-up imaging with repeat CT of the chest in 1 to 3 months to ensure stability and/or resolution. 7. Aortic atherosclerotic calcifications and coronary artery calcifications. 8. Progressive increased size of bilateral inguinal and left external iliac lymph nodes compared with 05/17/2024. Nonspecific but favor reactive changes.      Disposition Plan  :    Status is: Inpatient  DVT Prophylaxis  :  Hep gtt   Lab Results  Component Value Date   PLT 424 (H) 05/26/2024    Diet :  Diet Order             DIET DYS 3 Room service appropriate? Yes; Fluid consistency: Thin  Diet effective now                    Inpatient Medications  Scheduled Meds:  feeding supplement  237 mL Oral BID BM   Gerhardt's butt cream   Topical BID   guaiFENesin   600 mg Oral BID   lidocaine   1 patch Transdermal Q24H   ranolazine   500 mg Oral BID   sertraline   100 mg Oral Daily   sodium chloride  flush  3 mL Intravenous Q12H   Continuous Infusions:   PRN Meds:.acetaminophen  **OR** acetaminophen , albuterol , alum & mag hydroxide-simeth, antiseptic oral rinse, artificial tears, bisacodyl,  diphenhydrAMINE, glycopyrrolate **OR** glycopyrrolate **OR** glycopyrrolate, haloperidol  **OR** haloperidol  **OR** haloperidol  lactate, HYDROmorphone  (DILAUDID ) injection, loperamide , methocarbamol  (ROBAXIN ) injection, morphine  injection, nitroGLYCERIN , nitroGLYCERIN , ondansetron  **OR** ondansetron  (ZOFRAN ) IV, phenol, polyethylene glycol, senna, trimethobenzamide     Objective:   Vitals:   05/26/24 2148 05/26/24 2319 05/27/24 0000 05/27/24 0400  BP:   98/71   Pulse:   86 98  Resp: (!) 25  13 13   Temp:  98.7 F (37.1 C)    TempSrc:  Axillary    SpO2: 97%  99% 100%  Weight:      Height:        Wt Readings from Last 3 Encounters:  05/26/24 45.7 kg  05/15/24 45.2 kg  10/20/22 43.5 kg     Intake/Output Summary (Last 24 hours) at 05/27/2024 1525 Last data filed at 05/27/2024 1039 Gross per 24 hour  Intake 423 ml  Output --  Net 423 ml     Physical Exam  Awake, alert, extremely frail, deconditioned, ill-appearing, appears more comfortable today Tachypneic, Diminished air entry with rales and crackles  Regular rate and rhythm,  Abdomen soft  Extremities with no edema        Data Review:    Recent Labs  Lab 05/22/24 0731 05/22/24 1304 05/24/24 0739 05/25/24 0407 05/26/24 0407  WBC 10.7* 10.8* 17.4* 15.6* 16.8*  HGB 9.1* 8.8* 8.4* 8.4* 9.3*  HCT 28.6* 26.6* 25.6* 25.2* 28.3*  PLT 284 265 367 378 424*  MCV 93.8 92.7 91.8 92.6 93.7  MCH 29.8 30.7 30.1 30.9 30.8  MCHC 31.8 33.1 32.8 33.3 32.9  RDW 13.8 13.8 14.5 14.3 14.7  LYMPHSABS 1.6 1.1 0.9 1.5 1.5  MONOABS 1.1* 1.0 1.2* 1.4*  1.8*  EOSABS 0.5 0.2 0.3 0.3 0.2  BASOSABS 0.0 0.0 0.0 0.0 0.1    Recent Labs  Lab 05/22/24 0732 05/22/24 1104 05/22/24 1304 05/23/24 0749 05/24/24 0739 05/25/24 0407 05/26/24 0407  NA 140  --   --  141 139 140 141  K 3.7  --   --  4.1 4.2 3.9 4.4  CL 110  --   --  118* 113* 114* 114*  CO2 17*  --   --  14* 15* 12* 19*  ANIONGAP 13  --   --  9 11 14 8   GLUCOSE 168*  --    --  120* 108* 148* 126*  BUN 22  --   --  21 26* 34* 43*  CREATININE 1.54*  --   --  1.51* 1.67* 1.71* 1.80*  AST 47*  --   --   --   --   --   --   ALT 52*  --   --   --   --   --   --   ALKPHOS 105  --   --   --   --   --   --   BILITOT 0.3  --   --   --   --   --   --   ALBUMIN 3.2*  --   --   --   --   --   --   CRP  --   --   --  10.2* 10.8* 11.1* 8.8*  PROCALCITON  --   --  0.17 0.22 0.15 0.12 0.12  LATICACIDVEN  --   --  1.0  --   --   --   --   INR 1.3*  --   --   --   --   --   --   HGBA1C 5.8*  --   --   --   --   --   --   BNP  --  801.9*  --  1,974.0* 2,005.7* 1,432.5* 1,815.1*  MG  --   --   --  2.3 2.1 1.8 2.1  CALCIUM  8.3*  --   --  7.8* 7.9* 8.1* 8.4*      Recent Labs  Lab 05/22/24 0732 05/22/24 1104 05/22/24 1304 05/23/24 0749 05/24/24 0739 05/25/24 0407 05/26/24 0407  CRP  --   --   --  10.2* 10.8* 11.1* 8.8*  PROCALCITON  --   --  0.17 0.22 0.15 0.12 0.12  LATICACIDVEN  --   --  1.0  --   --   --   --   INR 1.3*  --   --   --   --   --   --   HGBA1C 5.8*  --   --   --   --   --   --   BNP  --  801.9*  --  1,974.0* 2,005.7* 1,432.5* 1,815.1*  MG  --   --   --  2.3 2.1 1.8 2.1  CALCIUM  8.3*  --   --  7.8* 7.9* 8.1* 8.4*    --------------------------------------------------------------------------------------------------------------- Lab Results  Component Value Date   CHOL 95 05/22/2024   HDL 18 (L) 05/22/2024   LDLCALC 43 05/22/2024   TRIG 173 (H) 05/22/2024   CHOLHDL 5.3 05/22/2024    Lab Results  Component Value Date   HGBA1C 5.8 (H) 05/22/2024   No results for input(s): TSH, T4TOTAL, FREET4, T3FREE, THYROIDAB in the last 72  hours. No results for input(s): VITAMINB12, FOLATE, FERRITIN, TIBC, IRON, RETICCTPCT in the last 72 hours.  ------------------------------------------------------------------------------------------------------------------ Cardiac Enzymes No results for input(s): CKMB, TROPONINI, MYOGLOBIN in  the last 168 hours.  Invalid input(s): CK  Micro Results Recent Results (from the past 240 hours)  MRSA Next Gen by PCR, Nasal     Status: None   Collection Time: 05/22/24  7:03 AM  Result Value Ref Range Status   MRSA by PCR Next Gen NOT DETECTED NOT DETECTED Final    Comment: (NOTE) The GeneXpert MRSA Assay (FDA approved for NASAL specimens only), is one component of a comprehensive MRSA colonization surveillance program. It is not intended to diagnose MRSA infection nor to guide or monitor treatment for MRSA infections. Test performance is not FDA approved in patients less than 39 years old. Performed at [facility], [address] [place], [place] [address]   Blood culture (routine x 2)     Status: None   Collection Time: 05/22/24 10:55 AM   Specimen: BLOOD RIGHT ARM  Result Value Ref Range Status   Specimen Description BLOOD RIGHT ARM  Final   Special Requests   Final    BOTTLES DRAWN AEROBIC AND ANAEROBIC Blood Culture results may not be optimal due to an inadequate volume of blood received in culture bottles   Culture   Final    NO GROWTH 5 DAYS Performed at [facility], [address] [place], [place] [address]    Report Status 05/27/2024 FINAL  Final  Blood culture (routine x 2)     Status: None   Collection Time: 05/22/24 11:00 AM   Specimen: BLOOD LEFT ARM  Result Value Ref Range Status   Specimen Description BLOOD LEFT ARM  Final   Special Requests   Final    BOTTLES DRAWN AEROBIC AND ANAEROBIC Blood Culture results may not be optimal due to an inadequate volume of blood received in culture bottles   Culture   Final    NO GROWTH 5 DAYS Performed at [facility], [address], [place], [place] [address]    Report Status 05/27/2024 FINAL  Final    Radiology Report No results found.    Signature  -   Brayton Lye M.D on 05/27/2024 at 3:25 PM   -  To page go to [email or web address]

## 2024-05-27 NOTE — Plan of Care (Signed)
°  Problem: Health Behavior/Discharge Planning: Goal: Ability to manage health-related needs will improve Outcome: Not Progressing   Problem: Clinical Measurements: Goal: Diagnostic test results will improve Outcome: Not Progressing Goal: Cardiovascular complication will be avoided Outcome: Not Progressing   Problem: Activity: Goal: Risk for activity intolerance will decrease Outcome: Not Progressing   Problem: Nutrition: Goal: Adequate nutrition will be maintained Outcome: Not Progressing  Pt on comfort care

## 2024-05-27 NOTE — Progress Notes (Addendum)
 Daily Progress Note   Date: 05/27/2024   Patient Name: Diksha Tagliaferro  DOB: 09-26-1955  MRN: [id number]  Age / Sex: 68 y.o., female  Attending Physician: Sherlon Brayton RAMAN, MD Primary Care Physician: [name], FNP Admit Date: 05/22/2024 Length of Stay: 5 days  Reason for Follow-up: Establishing goals of care, Non pain symptom management, Pain control, and Terminal Care  Past Medical History:  Diagnosis Date   Anxiety    Blood transfusion without reported diagnosis    CAD in native artery residual post LAD stent, 100% RCA and 80% LCx.  03/30/2022   CHF (congestive heart failure) (HCC)    CKD (chronic kidney disease) stage 4, GFR 15-29 ml/min (HCC)    Cocaine abuse (HCC)    Essential hypertension    HLD (hyperlipidemia) 03/30/2022   Lupus    On mechanically assisted ventilation (HCC) extubated 03/28/22  03/28/2022   Polysubstance abuse (HCC)    Positive ANA (antinuclear antibody)    S/P angioplasty with stent to mLAD 03/27/22 03/30/2022    Assessment & Plan:   HPI/Patient Profile:   Myrella Fahs is a 68 y.o. female with medical history significant of ASCVD and MI status post PCI October 2023, RA, hypertension, hyperlipidemia, CKD 3B, SLE. Admitted with chest pain and difficulty breathing following a recent hospitalization for pneumonia. Palliative care has been asked to support additional goals of care conversations in the setting of sepsis.    SUMMARY OF RECOMMENDATIONS DNR-comfort TOC order placed for hospice evaluation, discussed with them, they plan to follow up 0.25-0.5 Dilaudid  PRN Q2 for pain/dyspnea Symptom management as below  Symptom Management:  Dilaudid  pain/dyspnea/increased work of breathing/RR>25 Tylenol  PRN pain/fever Biotin PRN daily Benadryl PRN itching Robinul PRN secretions Haldol  PRN agitation/delirium Ativan  PRN anxiety/seizure/sleep/distress Zofran  PRN nausea/vomiting Liquifilm Tears PRN dry eye  Phenol spray for sore  throat  Code Status: DNR - Comfort  Prognosis: < 2 weeks  Discharge Planning: To Be Determined  Discussed with: Elgergawy MD about plan to have patient assessed for inpatient hospice.   Subjective:   Subjective: Chart Reviewed. Updates received. Patient Assessed. Created space and opportunity for patient  and family to explore thoughts and feelings regarding current medical situation.  Received 0.5 mg IV dilaudid  on 06/05/2024 @ 2148 and no other administration since. Lidocaine  patch on chest. Continues to have poor PO intake at this time but this shift able to take in 420 mL of liquids. 10% of meal eaten.   Today's Discussion:  Met with patient today without any visitors bedside. Patient comfortable after full transition to comfort measures. Continues to have chest pain most likely associated with MSK rather than cardiac. Patient endorses continued sore throat - PRN mouthwash and spray ordered. Although today patient seems less decisional today and unable to repeat understanding of comfort measures and hospice. Patient reiterates that she would like to get a dog to care for in the future when asked about what she thought about getting out of the hospital and transferring to a hospice facility.   Review of Systems  HENT:  Positive for sore throat.   Cardiovascular:  Positive for chest pain.  All other systems reviewed and are negative.  Objective:   Primary Diagnoses: Present on Admission:  Sepsis due to pneumonia (HCC)  Chest pain  Elevated troponin  Transient hypotension  Acute on chronic systolic CHF (congestive heart failure) (HCC)  Acute respiratory failure with hypoxia (HCC)  Normocytic anemia  CKD (chronic kidney disease), stage III (HCC)  CAD in  native artery residual post LAD stent, 100% RCA and 80% LCx.   HLD (hyperlipidemia)  Tobacco abuse  Prolonged QT interval  Dysthymic disorder  Lupus (systemic lupus erythematosus) (HCC)   Vital Signs:  BP 98/71 (BP  Location: Right Arm)   Pulse 98   Temp 98.7 F (37.1 C) (Axillary)   Resp 13   Ht 5' (1.524 m)   Wt 45.7 kg   SpO2 100%   BMI 19.68 kg/m   Physical Exam Constitutional:      Appearance: She is ill-appearing.     Comments: Alert but requires frequent redirection to stay engaged in conversation. Cachectic.   HENT:     Head: Normocephalic.  Pulmonary:     Effort: Pulmonary effort is normal.     Comments: On 2 L/min [place] Chest:     Chest wall: Tenderness present.  Neurological:     Mental Status: She is alert. She is disoriented.    Palliative Assessment/Data: 20%   Existing Vynca/ACP Documentation: None  Thank you for allowing us  to participate in the care of Kewanna Kasprzak PMT will continue to support holistically.  I personally spent a total of 35 minutes in the care of the patient today including preparing to see the patient, performing a medically appropriate exam/evaluation, counseling and educating, placing orders, referring and communicating with other health care professionals, and documenting clinical information in the EHR.  [name]  Palliative Medicine Team  Team Phone # [phone] (Nights/Weekends) 05/27/2024 9:52 AM

## 2024-05-28 MED ORDER — LORAZEPAM 2 MG/ML IJ SOLN: 1.0000 mg | INTRAMUSCULAR | Status: DC | PRN

## 2024-05-28 MED ORDER — HYDROMORPHONE HCL 1 MG/ML IJ SOLN
0.5000 mg | INTRAMUSCULAR | Status: DC | PRN
  Administered 2024-05-29: 0.5 mg via INTRAVENOUS
  Administered 2024-05-29: 1 mg via INTRAVENOUS
  Administered 2024-05-30: 2 mg via INTRAVENOUS
  Administered 2024-05-31 (×2): 1 mg via INTRAVENOUS
  Administered 2024-06-01: 11:00:00 2 mg via INTRAVENOUS
  Administered 2024-06-03 (×2): 1 mg via INTRAVENOUS
  Filled 2024-05-28 (×3): qty 1
  Filled 2024-05-28: qty 2
  Filled 2024-05-28 (×2): qty 1
  Filled 2024-05-28: qty 2
  Filled 2024-05-28 (×2): qty 1
  Filled 2024-05-28: qty 2
  Filled 2024-05-28 (×2): qty 1

## 2024-05-28 NOTE — Plan of Care (Signed)
  Problem: Clinical Measurements: Goal: Diagnostic test results will improve Outcome: Not Progressing   Problem: Activity: Goal: Risk for activity intolerance will decrease Outcome: Not Progressing   Problem: Safety: Goal: Ability to remain free from injury will improve Outcome: Not Progressing   Problem: Skin Integrity: Goal: Risk for impaired skin integrity will decrease Outcome: Not Progressing

## 2024-05-28 NOTE — Progress Notes (Signed)
 PROGRESS NOTE     Patient Demographics:    Cassidy Larson, is a 68 y.o. female, DOB - September 05, 1955, FMW:[id number]  Outpatient Primary MD for the patient is [name], FNP    LOS - 6  Admit date - 05/22/2024    Chief Complaint  Patient presents with   Chest Pain       Brief Narrative (HPI from H&P)     68 y.o. 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   She was  just recently been hospitalized from 11/29-12/4 with concern for sepsis thought possibly secondary to a left-sided bacterial community-acquired pneumonia.  Blood cultures were negative and patient was treated with IV cefepime  and transition to oral Augmentin  to complete a 5-day course at discharge. Records note urine Legionella cultures were positive from admission the last admission.   In the ER her workup was consistent with NSTEMI, CHF with acute hypoxic respiratory failure, possible worsening pneumonia and she was admitted to the hospital for further management, please see discussion below.    Subjective:   Patient in bed, is more somnolent today, unable to provide any complaints, but appears comfortable, as discussed with staff, she has no oral intake, Foley catheter inserted this morning.    Assessment  & Plan :   Acute respiratory failure with hypoxia Sepsis due to community-acquired pneumonia, recent Legionella pneumonia - Patient was recently diagnosed with Legionella pneumonia, CT scan shows worsening pulmonary edema and pneumonia. - Treated with  appropriate antibiotic azithromycin  and Augmentin . - Encouraged use incentive spirometry and flutter valve. - Still with significant oxygen requirement, still 5 to 6 L nasal cannula.  NSTEMI Acute on chronic diastolic congestive heart failure exacerbation -With evidence of volume overload, suspected acute on chronic.  - CTA of the chest noted concern for pulmonary edema with signs of right heart failure.  Echo noted with EF 60% and stable wall motion as reported. - NSTEMI, likely type II in the setting  of respiratory failure, hypoxia with underlying CAD. - Finished heparin  drip x 48 hours. 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   -Anemia panel suggestive of iron deficiency, placed on replacement, on PPI no signs of ongoing bleeding, poor candidate for EGD or colonoscopy at this time    Prolonged QT interval Chronic.  QTc 512 - Monitor electrolytes, minimize QT prolonging medications as much as practically possible, due to recent Legionella pneumonia and worsening pneumonia on CT despite treatment before azithromycin  x 7 days.   Systemic lupus Review of records note patient with a history of positive ANA back in 2019.  Previously had been on Plaquenil   Mixed hyperlipidemia - Continue Lipitor    Dysthymic disorder - Continue Zoloft  but held mirtazapine   Severe mitral valve regurgitation -Not a surgical candidate  Elevated liver enzymes Acute.  AST 47 and ALT 52.  Possibly secondary to passive congestion from patient being fluid overload. - Continue to monitor   Tobacco abuse Patient just  recently stopped smoking in the last week. - Continue to encourage cessation of tobacco use  Incidental finding on CT.  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 Consider follow-up imaging with repeat CT of the chest in 1 to 3 months to ensure stability and/or resolution.  PCP to monitor.   Goals of care discussion End-of-life care - Patient with very poor life quality, severely deconditioned, at this point she is currently dyspneic, uncomfortable with recurrent chest pain, though unlikely cardiac, she is requesting multiple doses of pain regimen due to discomfort, dyspnea, palliative care involved, medicine input greatly appreciated, no family could be located, but patient was clear about her decisions not to pursue any aggressive measures, at this point wants to focus on comfort, she is at end-of-life, she is significantly dyspneic, hypoxic, frail, with poor appetite and oral intake, failure to thrive.  Plan for discharge to facility with hospice       Condition - Extremely Guarded  Family Communication  : No family, legal guardian listed as Mrs. Ascencion [phone], she was called on 05/23/2024 at 8:30 AM, she informs me that she is not a legal guardian or POA she is simply a production designer, theatre/television/film of the facility where patient lives, she confirms patient has no family and decides for herself.  Code Status : DNR  Consults  : Cardiology, palliative care  PUD Prophylaxis : PPI.   Procedures  :     TTE -  1. Left ventricular ejection fraction, by estimation, is 60 to 65%. The left ventricle has normal function. The left ventricle has no regional wall motion abnormalities. Left ventricular diastolic parameters are indeterminate.  2. Right ventricular systolic function is normal. The right ventricular size is normal.  3. Shadowing artifact in LA from MAC. Restricted posterior leaflet motion. Consider f/u TEE to further evaluate the degree of MR and MV morphology. The mitral  valve is abnormal. Severe mitral valve regurgitation. No evidence of mitral stenosis. Severe mitral annular calcification.  4. Tricuspid valve regurgitation is mild to moderate.  5. The aortic valve is normal in structure. Aortic valve regurgitation is not visualized. No  aortic stenosis is present.  6. The inferior vena cava is normal in size with greater than 50% respiratory variability, suggesting right atrial pressure of 3 mmHg.  CT - 1. No evidence of acute aortic syndrome. 2. Progressive left upper lobe pneumonia. 3. Diffuse pulmonary edema and small left pleural effusion. Reflux of contrast material into the IVC and hepatic veins consistent with right heart failure. 4. No central pulmonary arterial occlusion. 5. Enlarging mediastinal lymph nodes are nonspecific in the setting of chf and pneumonia. 6. 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 Consider follow-up imaging with repeat CT of the chest in 1 to 3 months to ensure stability and/or resolution. 7. Aortic atherosclerotic calcifications and coronary artery calcifications. 8. Progressive increased size of bilateral inguinal and left external iliac lymph nodes compared with 05/17/2024. Nonspecific but favor reactive changes.      Disposition Plan  :    Status is: Inpatient  DVT Prophylaxis  :  Hep gtt   Lab Results  Component Value Date   PLT 424 (H) 05/26/2024    Diet :  Diet Order             DIET DYS 3 Room service appropriate? Yes; Fluid consistency: Thin  Diet effective now                    Inpatient Medications  Scheduled Meds:  feeding supplement  237 mL Oral BID BM   Gerhardt's butt cream   Topical BID   guaiFENesin   600 mg Oral BID   lidocaine   1 patch Transdermal Q24H   sertraline   100 mg Oral Daily   sodium chloride  flush  3 mL Intravenous Q12H   Continuous Infusions:   PRN Meds:.acetaminophen  **OR** acetaminophen , albuterol ,  antiseptic oral rinse, artificial tears, bisacodyl, diphenhydrAMINE, glycopyrrolate **OR** glycopyrrolate **OR** glycopyrrolate, haloperidol  **OR** haloperidol  **OR** haloperidol  lactate, HYDROmorphone  (DILAUDID ) injection, loperamide , LORazepam , ondansetron  **OR** ondansetron  (ZOFRAN ) IV, phenol, polyethylene glycol, trimethobenzamide     Objective:   Vitals:   05/27/24 0000 05/27/24 0400 05/27/24 2029 05/28/24 0813  BP: 98/71  96/67 92/67  Pulse: 86 98 98 89  Resp: 13 13 13    Temp:   99.3 F (37.4 C) 98.8 F (37.1 C)  TempSrc:   Axillary Oral  SpO2: 99% 100%    Weight:      Height:        Wt Readings from Last 3 Encounters:  05/26/24 45.7 kg  05/15/24 45.2 kg  10/20/22 43.5 kg     Intake/Output Summary (Last 24 hours) at 05/28/2024 1559 Last data filed at 05/28/2024 1001 Gross per 24 hour  Intake 456 ml  Output 450 ml  Net 6 ml     Physical Exam  She is somnolent today, but appears comfortable  Diminished air entry with rales and crackles, but no respiratory distress Regular rate and rhythm,  Abdomen soft  Extremities with no edema        Data Review:    Recent Labs  Lab 05/22/24 0731 05/22/24 1304 05/24/24 0739 05/25/24 0407 05/26/24 0407  WBC 10.7* 10.8* 17.4* 15.6* 16.8*  HGB 9.1* 8.8* 8.4* 8.4* 9.3*  HCT 28.6* 26.6* 25.6* 25.2* 28.3*  PLT 284 265 367 378 424*  MCV 93.8 92.7 91.8 92.6 93.7  MCH 29.8 30.7 30.1 30.9 30.8  MCHC 31.8 33.1 32.8 33.3 32.9  RDW 13.8 13.8 14.5 14.3 14.7  LYMPHSABS 1.6 1.1 0.9 1.5 1.5  MONOABS 1.1* 1.0 1.2*  1.4* 1.8*  EOSABS 0.5 0.2 0.3 0.3 0.2  BASOSABS 0.0 0.0 0.0 0.0 0.1    Recent Labs  Lab 05/22/24 0732 05/22/24 1104 05/22/24 1304 05/23/24 0749 05/24/24 0739 05/25/24 0407 05/26/24 0407  NA 140  --   --  141 139 140 141  K 3.7  --   --  4.1 4.2 3.9 4.4  CL 110  --   --  118* 113* 114* 114*  CO2 17*  --   --  14* 15* 12* 19*  ANIONGAP 13  --   --  9 11 14 8   GLUCOSE 168*  --   --  120* 108* 148* 126*   BUN 22  --   --  21 26* 34* 43*  CREATININE 1.54*  --   --  1.51* 1.67* 1.71* 1.80*  AST 47*  --   --   --   --   --   --   ALT 52*  --   --   --   --   --   --   ALKPHOS 105  --   --   --   --   --   --   BILITOT 0.3  --   --   --   --   --   --   ALBUMIN 3.2*  --   --   --   --   --   --   CRP  --   --   --  10.2* 10.8* 11.1* 8.8*  PROCALCITON  --   --  0.17 0.22 0.15 0.12 0.12  LATICACIDVEN  --   --  1.0  --   --   --   --   INR 1.3*  --   --   --   --   --   --   HGBA1C 5.8*  --   --   --   --   --   --   BNP  --  801.9*  --  1,974.0* 2,005.7* 1,432.5* 1,815.1*  MG  --   --   --  2.3 2.1 1.8 2.1  CALCIUM  8.3*  --   --  7.8* 7.9* 8.1* 8.4*      Recent Labs  Lab 05/22/24 0732 05/22/24 1104 05/22/24 1304 05/23/24 0749 05/24/24 0739 05/25/24 0407 05/26/24 0407  CRP  --   --   --  10.2* 10.8* 11.1* 8.8*  PROCALCITON  --   --  0.17 0.22 0.15 0.12 0.12  LATICACIDVEN  --   --  1.0  --   --   --   --   INR 1.3*  --   --   --   --   --   --   HGBA1C 5.8*  --   --   --   --   --   --   BNP  --  801.9*  --  1,974.0* 2,005.7* 1,432.5* 1,815.1*  MG  --   --   --  2.3 2.1 1.8 2.1  CALCIUM  8.3*  --   --  7.8* 7.9* 8.1* 8.4*    --------------------------------------------------------------------------------------------------------------- Lab Results  Component Value Date   CHOL 95 05/22/2024   HDL 18 (L) 05/22/2024   LDLCALC 43 05/22/2024   TRIG 173 (H) 05/22/2024   CHOLHDL 5.3 05/22/2024    Lab Results  Component Value Date   HGBA1C 5.8 (H) 05/22/2024   No results for input(s): TSH, T4TOTAL, FREET4, T3FREE, THYROIDAB in the last  72 hours. No results for input(s): VITAMINB12, FOLATE, FERRITIN, TIBC, IRON, RETICCTPCT in the last 72 hours.  ------------------------------------------------------------------------------------------------------------------ Cardiac Enzymes No results for input(s): CKMB, TROPONINI, MYOGLOBIN in the last 168  hours.  Invalid input(s): CK  Micro Results Recent Results (from the past 240 hours)  MRSA Next Gen by PCR, Nasal     Status: None   Collection Time: 05/22/24  7:03 AM  Result Value Ref Range Status   MRSA by PCR Next Gen NOT DETECTED NOT DETECTED Final    Comment: (NOTE) The GeneXpert MRSA Assay (FDA approved for NASAL specimens only), is one component of a comprehensive MRSA colonization surveillance program. It is not intended to diagnose MRSA infection nor to guide or monitor treatment for MRSA infections. Test performance is not FDA approved in patients less than 1 years old. Performed at [facility], [address], [place], [place] [address]   Blood culture (routine x 2)     Status: None   Collection Time: 05/22/24 10:55 AM   Specimen: BLOOD RIGHT ARM  Result Value Ref Range Status   Specimen Description BLOOD RIGHT ARM  Final   Special Requests   Final    BOTTLES DRAWN AEROBIC AND ANAEROBIC Blood Culture results may not be optimal due to an inadequate volume of blood received in culture bottles   Culture   Final    NO GROWTH 5 DAYS Performed at [facility], [address], [place], [place] [address]    Report Status 05/27/2024 FINAL  Final  Blood culture (routine x 2)     Status: None   Collection Time: 05/22/24 11:00 AM   Specimen: BLOOD LEFT ARM  Result Value Ref Range Status   Specimen Description BLOOD LEFT ARM  Final   Special Requests   Final    BOTTLES DRAWN AEROBIC AND ANAEROBIC Blood Culture results may not be optimal due to an inadequate volume of blood received in culture bottles   Culture   Final    NO GROWTH 5 DAYS Performed at [facility], [address] [place], [place] [address]    Report Status 05/27/2024 FINAL  Final    Radiology Report No results found.    Signature  -   Brayton Lye M.D on 05/28/2024 at 3:59 PM   -  To page go to [email or web address]

## 2024-05-28 NOTE — TOC Progression Note (Signed)
 Transition of Care [facility]) - Progression Note    Patient Details  Name: Cassidy Larson MRN: [id number] Date of Birth: 27-Feb-1956  Transition [facility] CM/SW Contact  [name], [place] Phone Number: 05/28/2024, 12:43 PM  Clinical Narrative:     CSW spoke with group home manager, Cassidy Larson, about plan for residential hospice facility for pt.  Cassidy Larson agreeable to [facility] in [place]. Referral made to [facility]. They will have a nurse come and assess pt today.      Social Drivers of Health (SDOH) Interventions SDOH Screenings   Food Insecurity: No Food Insecurity (05/22/2024)  Housing: Unknown (05/22/2024)  Transportation Needs: No Transportation Needs (05/22/2024)  Recent Concern: Transportation Needs - Unmet Transportation Needs (05/15/2024)  Utilities: Not At Risk (05/22/2024)  Financial Resource Strain: Not on File (11/14/2022)   Received from [facility]  Physical Activity: Not on File (11/14/2022)   Received from [facility]  Social Connections: Socially Isolated (05/22/2024)  Stress: Not on File (11/14/2022)   Received from [facility]  Tobacco Use: High Risk (05/15/2024)    Readmission Risk Interventions    05/16/2024    2:59 PM  Readmission Risk Prevention Plan  Transportation Screening Complete  PCP or Specialist Appt within 5-7 Days Not Complete  Home Care Screening Complete  Medication Review (RN CM) Complete

## 2024-05-28 NOTE — TOC Progression Note (Signed)
 Transition of Care [facility]) - Progression Note    Patient Details  Name: Cassidy Larson MRN: [id number] Date of Birth: [birth year]  Transition of Care [facility]) CM/SW Contact  [name], [place] Phone Number: 05/28/2024, 3:39 PM  Clinical Narrative:     [facility] RN Beth came and assessed pt. CSW met with her at nursing station. She states that pt is appropriate for their facility and they have a bed though  though that barrier is they do not have appropriate person to sign pt in. She explains [facility] would need DSS to sign off on patient admitting to their facility. She also stated that group home manager had called DSS and was told not to sign patient in either. Hospital staff have been unable to identify any next of kin or friends to help make decisions for pt.    CSW made APS report to [facility].   1530: Received call from [facility] and informed they accepted the report and transferred it to [facility].          Social Drivers of Health (SDOH) Interventions SDOH Screenings   Food Insecurity: No Food Insecurity (05/22/2024)  Housing: Unknown (05/22/2024)  Transportation Needs: No Transportation Needs (05/22/2024)  Recent Concern: Transportation Needs - Unmet Transportation Needs (05/15/2024)  Utilities: Not At Risk (05/22/2024)  Financial Resource Strain: Not on File (11/14/2022)   Received from [facility]  Physical Activity: Not on File (11/14/2022)   [facility]  Social Connections: Socially Isolated (05/22/2024)  Stress: Not on File (11/14/2022)   Received from [facility]  Tobacco Use: High Risk (05/15/2024)    Readmission Risk Interventions    05/16/2024    2:59 PM  Readmission Risk Prevention Plan  Transportation Screening Complete  PCP or Specialist Appt within 5-7 Days Not Complete  Home Care Screening Complete  Medication Review (RN CM) Complete

## 2024-05-28 NOTE — Progress Notes (Addendum)
 Palliative Medicine Inpatient Follow Up Note   HPI: Patient is a 68 year old female with medical history significant of ASCVD and myocardial infarction status post PCI October 2023, RA, hypertension, hyperlipidemia, CKD stage IIIb, SLE.  She was admitted with chest pain and difficulty of breathing following a recent hospitalization for pneumonia.  Palliative team has been asked to support additional goals of care conversations in the setting of sepsis.   Today's Discussion 05/28/2024   I have reviewed medical records including: EPIC notes: Reviewed hospitalist progress note from 05/27/2024, detailing plan of care.  Per note patient is approaching hospice, she is significantly dyspneic, hypoxic, frail, with poor appetite and oral intake, failure to thrive. Vital signs: Reviewed from 05/28/2024 at 8:13 AM: Temperature 98.8, blood pressure 92/67, heart rate 89, oxygen saturation 100% on 3 L nasal cannula. MAR: Reviewed medicines taken over the last 24 hours.  Noted for some medications not given due to refusal.  As needed Dilaudid  0.5 mg given last night at 2102, this morning at 0635, and at 0958.  Reviewed to assess effectiveness of symptom management. Available advanced directives in ACP: None   Visited patient at bedside today.  No family members present.  Patient appears chronically ill, laying comfortably in a Semi-Fowler's position.  Not in any form of acute distress.  No gestures or signs revealing pain or discomfort.  She is currently getting O2 supplement at 2 L per nasal cannula.  I tried to engage in a conversation with patient, however patient is unable to engage in a conversation.  When asked how she was doing, she replied that she is feeling tired and sleepy.  She denies any pain.  When asked if there are any needs that she wants me to help her with, she denies.  Shortly after, I coordinated with the bedside RN on the plan of care.  RN reports that there are no significant changes  overnight.  She reports the patient continues to be drowsy with a poor appetite.  Not able to tolerate p.o. medications.  She reports patient having generalized body pain and may need adjustments with the current pain regimen.  She reports the patient may need a Foley catheter for end-of-life.  Goals of care remain unchanged.  No family members present.  Patient to continue comfort focused care.  Pain regimen will be adjusted in accordance with effectiveness.  TOC consult has been placed yesterday to assist with hospice inpatient evaluation.  Discussed with RN to titrate O2 supplement down as needed.  Questions and concerns addressed   Palliative Support Provided.   Objective Assessment: Vital Signs Vitals:   05/27/24 2029 05/28/24 0813  BP: 96/67 92/67  Pulse: 98 89  Resp: 13   Temp: 99.3 F (37.4 C) 98.8 F (37.1 C)  SpO2:      Intake/Output Summary (Last 24 hours) at 05/28/2024 1056 Last data filed at 05/28/2024 1001 Gross per 24 hour  Intake 816 ml  Output 450 ml  Net 366 ml   Last Weight  Most recent update: 05/26/2024  4:43 AM    Weight  45.7 kg (100 lb 12 oz)             Gen: Chronically ill-appearing, on O2 supplement 2 L per nasal cannula, cachectic HEENT: Mucous membranes are moist CV: Regular rate and rhythm PULM: Clear but diminished bilaterally ABD: Soft and nontender EXT: No edema Neuro: Drowsy  SUMMARY OF RECOMMENDATIONS    # Complex medical decision-making/goals of care  Code Status: Transition  to DNR-Comfort No family members or surrogate decision maker, LCSW mentioned utilizing group home manager to be patient's decision maker.  Continue to monitor for placement inpatient hospice versus in-patient hospital death.  [facility] plans to visit patient today to conduct evaluation for inpatient hospice eligibility. [name].  Unrestricted visitations in the setting of EOL (per policy) Reviewed current  medications, discontinued non-essentials to optimize comforts  Symptom Management: Acetaminophen  PRN for mild pain Hydromorphone  PRN for pain/air hunger/comfort, adjusted dose to optimize comforts. Consider dose titration, if current dosing ineffective.  Robinul PRN for excessive secretions Ativan  PRN for agitation/anxiety Zofran  PRN for nausea Liquifilm tears PRN for dry eyes Haldol  PRN for agitation/anxiety Dulcolax PRN for constipation Benadryl PRN for itching May have comfort feeding Oxygen PRN 2L or less for comfort. No escalation, consider weaning off.  Insert foley catheter for end of life care   # Psychosocial support  Provided to patient.  # Discharge planning  Unable to be determined at this time, likely in-patient hospice, or anticipate hospital death if patient progressively declines over the next 24 to 48 hours. It also appears that given cognitive decline, patient may not able to be in capacity to provide consent for disposition or placement, favoring hospital death in this case.    I personally spent a total of 35 minutes in the care of the patient today including preparing to see the patient, getting/reviewing separately obtained history, performing a medically appropriate exam/evaluation, counseling and educating, placing orders, referring and communicating with other health care professionals, documenting clinical information in the EHR, communicating results, and coordinating care.    ______________________________________________________________________________________ Kathlyne Bolder NP-C [facility] Palliative Medicine Team Team Cell Phone: [phone] Please utilize secure chat with additional questions, if there is no response within 30 minutes please call the above phone number  Palliative Medicine Team providers are available by phone from 7am to 7pm daily and can be reached through the team cell phone.  Should this patient require assistance outside of  these hours, please call the patient's attending physician.

## 2024-05-29 NOTE — Progress Notes (Signed)
 PROGRESS NOTE     Patient Demographics:    Cassidy Larson, is a 68 y.o. female, DOB - 06-Feb-1956, FMW:[id number]  Outpatient Primary MD for the patient is [name], FNP    LOS - 7  Admit date - 05/22/2024    Chief Complaint  Patient presents with   Chest Pain       Brief Narrative (HPI from H&P)     68 y.o. 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   She was  just recently been hospitalized from 11/29-12/4 with concern for sepsis thought possibly secondary to a left-sided bacterial community-acquired pneumonia.  Blood cultures were negative and patient was treated with IV cefepime  and transition to oral Augmentin  to complete a 5-day course at discharge. Records note urine Legionella cultures were positive from admission the last admission.   In the ER her workup was consistent with NSTEMI, CHF with acute hypoxic respiratory failure, possible worsening pneumonia and she was admitted to the hospital for further management, please see discussion below.    Subjective:   No significant events overnight, she denies any complaints today, asking for orange juice   Assessment  & Plan :   Acute respiratory failure with hypoxia Sepsis due to community-acquired pneumonia, recent Legionella pneumonia - Patient was recently diagnosed with Legionella pneumonia, CT scan shows worsening pulmonary edema and pneumonia. - Treated with appropriate antibiotic azithromycin  and Augmentin . - Encouraged use incentive spirometry and flutter  valve. - Still with significant oxygen requirement, still 5 to 6 L nasal cannula.  NSTEMI Acute on chronic diastolic congestive heart failure exacerbation -With evidence of volume overload, suspected acute on chronic.  - CTA of the chest noted concern for pulmonary edema with signs of right heart failure.  Echo noted with EF 60% and stable wall motion as reported. - NSTEMI, likely type II in the setting of respiratory failure, hypoxia with underlying CAD. - Finished heparin  drip x 48 hours. 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   -Anemia panel suggestive of iron deficiency, placed on replacement, on PPI no signs of ongoing bleeding, poor candidate for EGD or colonoscopy at this time    Prolonged QT interval Chronic.  QTc 512 - Monitor electrolytes, minimize QT prolonging medications as much as practically possible, due to recent Legionella pneumonia and worsening pneumonia on CT despite treatment before azithromycin  x 7 days.   Systemic lupus Review of records note patient with a history of positive ANA back in 2019.  Previously had been on Plaquenil   Mixed hyperlipidemia - Continue Lipitor    Dysthymic disorder - Continue Zoloft  but held mirtazapine   Severe mitral valve regurgitation -Not a surgical candidate  Elevated liver enzymes Acute.  AST 47 and ALT 52.  Possibly secondary to passive congestion from patient being fluid overload. - Continue to monitor   Tobacco abuse Patient just recently stopped smoking in the last week. - Continue to encourage cessation of tobacco  use  Incidental finding on CT.  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 Consider follow-up imaging with repeat CT of the chest in 1 to 3 months to ensure stability and/or resolution.  PCP to monitor.   Goals of care discussion End-of-life care - Patient with very poor life quality, severely deconditioned, at this point she is currently dyspneic, uncomfortable with recurrent chest pain, though unlikely cardiac, she is requesting multiple doses of pain regimen due to discomfort, dyspnea, palliative care involved, medicine input greatly appreciated, no family could be located, but patient was clear about her decisions not to pursue any aggressive measures, at this point wants to focus on comfort, she is at end-of-life, she is significantly dyspneic, hypoxic, frail, with poor appetite and oral intake, failure to thrive.  Plan for discharge to facility with hospice, social worker filed APS report on 05/28/2024 she can be transferred back to her facility with [facility]       Condition - Extremely Guarded  Family Communication  : No family, legal guardian listed as Mrs. Ascencion [phone], she was called on 05/23/2024 at 8:30 AM, she informs me that she is not a legal guardian or POA she is simply a production designer, theatre/television/film of the facility where patient lives, she confirms patient has no family and decides for herself.  Code Status : DNR  Consults  : Cardiology, palliative care  PUD Prophylaxis : PPI.   Procedures  :     TTE -  1. Left ventricular ejection fraction, by estimation, is 60 to 65%. The left ventricle has normal function. The left ventricle has no regional wall motion abnormalities. Left ventricular diastolic parameters are indeterminate.  2. Right ventricular systolic function is normal. The right ventricular size is normal.  3. Shadowing artifact in LA from MAC. Restricted posterior leaflet motion. Consider f/u TEE to further evaluate the degree of MR and MV  morphology. The mitral valve is abnormal. Severe mitral valve regurgitation. No evidence of mitral stenosis. Severe mitral annular calcification.  4. Tricuspid valve regurgitation is mild to moderate.  5. The aortic valve is normal in structure. Aortic valve regurgitation is not visualized.  No aortic stenosis is present.  6. The inferior vena cava is normal in size with greater than 50% respiratory variability, suggesting right atrial pressure of 3 mmHg.  CT - 1. No evidence of acute aortic syndrome. 2. Progressive left upper lobe pneumonia. 3. Diffuse pulmonary edema and small left pleural effusion. Reflux of contrast material into the IVC and hepatic veins consistent with right heart failure. 4. No central pulmonary arterial occlusion. 5. Enlarging mediastinal lymph nodes are nonspecific in the setting of chf and pneumonia. 6. 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 Consider follow-up imaging with repeat CT of the chest in 1 to 3 months to ensure stability and/or resolution. 7. Aortic atherosclerotic calcifications and coronary artery calcifications. 8. Progressive increased size of bilateral inguinal and left external iliac lymph nodes compared with 05/17/2024. Nonspecific but favor reactive changes.      Disposition Plan  :    Status is: Inpatient  DVT Prophylaxis  :  Hep gtt   Lab Results  Component Value Date   PLT 424 (H) 05/26/2024    Diet :  Diet Order             DIET DYS 3 Room service appropriate? Yes; Fluid consistency: Thin  Diet effective now                    Inpatient Medications  Scheduled Meds:  feeding supplement  237 mL Oral BID BM   Gerhardt's butt cream   Topical BID   guaiFENesin   600 mg Oral BID   lidocaine   1 patch Transdermal Q24H   sertraline   100 mg Oral Daily   sodium chloride  flush  3 mL Intravenous Q12H   Continuous Infusions:   PRN Meds:.acetaminophen  **OR**  acetaminophen , albuterol , antiseptic oral rinse, artificial tears, bisacodyl , diphenhydrAMINE , glycopyrrolate  **OR** glycopyrrolate  **OR** glycopyrrolate , haloperidol  **OR** haloperidol  **OR** haloperidol  lactate, HYDROmorphone  (DILAUDID ) injection, loperamide , LORazepam , ondansetron  **OR** ondansetron  (ZOFRAN ) IV, phenol, polyethylene glycol, trimethobenzamide     Objective:   Vitals:   05/27/24 2029 05/28/24 0813 05/28/24 2036 05/29/24 0753  BP: 96/67 92/67 96/69  91/62  Pulse: 98 89 85 80  Resp: 13  17 16   Temp: 99.3 F (37.4 C) 98.8 F (37.1 C) 98.4 F (36.9 C) 97.6 F (36.4 C)  TempSrc: Axillary Oral Oral Oral  SpO2:      Weight:      Height:        Wt Readings from Last 3 Encounters:  05/26/24 45.7 kg  05/15/24 45.2 kg  10/20/22 43.5 kg     Intake/Output Summary (Last 24 hours) at 05/29/2024 1343 Last data filed at 05/28/2024 2106 Gross per 24 hour  Intake 243 ml  Output --  Net 243 ml     Physical Exam  She is somnolent, but wakes up, appears comfortable Diminished air entry Regular rate and rhythm,  Abdomen soft  Extremities with no edema        Data Review:    Recent Labs  Lab 05/24/24 0739 05/25/24 0407 05/26/24 0407  WBC 17.4* 15.6* 16.8*  HGB 8.4* 8.4* 9.3*  HCT 25.6* 25.2* 28.3*  PLT 367 378 424*  MCV 91.8 92.6 93.7  MCH 30.1 30.9 30.8  MCHC 32.8 33.3 32.9  RDW 14.5 14.3 14.7  LYMPHSABS 0.9 1.5 1.5  MONOABS 1.2* 1.4* 1.8*  EOSABS 0.3 0.3 0.2  BASOSABS 0.0 0.0 0.1    Recent Labs  Lab 05/23/24 0749 05/24/24 0739 05/25/24 0407 05/26/24  0407  NA 141 139 140 141  K 4.1 4.2 3.9 4.4  CL 118* 113* 114* 114*  CO2 14* 15* 12* 19*  ANIONGAP 9 11 14 8   GLUCOSE 120* 108* 148* 126*  BUN 21 26* 34* 43*  CREATININE 1.51* 1.67* 1.71* 1.80*  CRP 10.2* 10.8* 11.1* 8.8*  PROCALCITON 0.22 0.15 0.12 0.12  BNP 1,974.0* 2,005.7* 1,432.5* 1,815.1*  MG 2.3 2.1 1.8 2.1  CALCIUM  7.8* 7.9* 8.1* 8.4*      Recent Labs  Lab 05/23/24 0749  05/24/24 0739 05/25/24 0407 05/26/24 0407  CRP 10.2* 10.8* 11.1* 8.8*  PROCALCITON 0.22 0.15 0.12 0.12  BNP 1,974.0* 2,005.7* 1,432.5* 1,815.1*  MG 2.3 2.1 1.8 2.1  CALCIUM  7.8* 7.9* 8.1* 8.4*    --------------------------------------------------------------------------------------------------------------- Lab Results  Component Value Date   CHOL 95 05/22/2024   HDL 18 (L) 05/22/2024   LDLCALC 43 05/22/2024   TRIG 173 (H) 05/22/2024   CHOLHDL 5.3 05/22/2024    Lab Results  Component Value Date   HGBA1C 5.8 (H) 05/22/2024   No results for input(s): TSH, T4TOTAL, FREET4, T3FREE, THYROIDAB in the last 72 hours. No results for input(s): VITAMINB12, FOLATE, FERRITIN, TIBC, IRON, RETICCTPCT in the last 72 hours.  ------------------------------------------------------------------------------------------------------------------ Cardiac Enzymes No results for input(s): CKMB, TROPONINI, MYOGLOBIN in the last 168 hours.  Invalid input(s): CK  Micro Results Recent Results (from the past 240 hours)  MRSA Next Gen by PCR, Nasal     Status: None   Collection Time: 05/22/24  7:03 AM  Result Value Ref Range Status   MRSA by PCR Next Gen NOT DETECTED NOT DETECTED Final    Comment: (NOTE) The GeneXpert MRSA Assay (FDA approved for NASAL specimens only), is one component of a comprehensive MRSA colonization surveillance program. It is not intended to diagnose MRSA infection nor to guide or monitor treatment for MRSA infections. Test performance is not FDA approved in patients less than 10 years old. Performed at [facility], [address], [place], [place] [address]   Blood culture (routine x 2)     Status: None   Collection Time: 05/22/24 10:55 AM   Specimen: BLOOD RIGHT ARM  Result Value Ref Range Status   Specimen Description BLOOD RIGHT ARM  Final   Special Requests   Final    BOTTLES DRAWN AEROBIC AND ANAEROBIC Blood Culture results may  not be optimal due to an inadequate volume of blood received in culture bottles   Culture   Final    NO GROWTH 5 DAYS Performed at [facility], [address] [place], [place] [address]    Report Status 05/27/2024 FINAL  Final  Blood culture (routine x 2)     Status: None   Collection Time: 05/22/24 11:00 AM   Specimen: BLOOD LEFT ARM  Result Value Ref Range Status   Specimen Description BLOOD LEFT ARM  Final   Special Requests   Final    BOTTLES DRAWN AEROBIC AND ANAEROBIC Blood Culture results may not be optimal due to an inadequate volume of blood received in culture bottles   Culture   Final    NO GROWTH 5 DAYS Performed at [facility], [address], [place], [place] [address]    Report Status 05/27/2024 FINAL  Final    Radiology Report No results found.    Signature  -   Brayton Lye M.D on 05/29/2024 at 1:43 PM   -  To page go to [email or web address]

## 2024-05-29 NOTE — Progress Notes (Addendum)
 Palliative Medicine Inpatient Follow Up Note   HPI: Patient is a 68 year old female with medical history significant of ASCVD and myocardial infarction status post PCI October 2023, rheumatoid arthritis, hypertension, hyperlipidemia, CKD stage IIIb, SLP.  She was admitted with chest pain and difficulty breathing following a recent hospitalization for pneumonia.  Palliative team has been asked to support additional goals of care conversation in the setting of sepsis.  Hospital stay #7.  Patient is a 7-day readmission, 30-day readmission, and has had 2 inpatient admissions in the last 6 months.  Today's Discussion 05/29/2024  I have reviewed medical records including:  EPIC notes: Reviewed hospitalist progress notes from 05/28/2024 detailing treatment plan.  Patient is currently at end-of-life, with plan for discharge to a facility with hospice.  Reviewed TOC progress note from 05/28/2024 indicating that patient was accepted by [facility], but reports that DSS needs to sign off on patient in order to be admitted into their facility.  CSW made an APS report to [facility] At 1530, [place] informed that they accepted the report and was transferred to [facility]. Awaiting for feedback. Reviewed to further determine disposition.   Vital signs: Reviewed from 05/29/2024 at 7:53 AM: Blood pressure 91/62, temperature 97.6, heart rate 80, respiration rate 16, oxygen saturation at 100% on 3 L of oxygen via nasal cannula.  MAR: Reviewed PRN meds received over the last 24 hours.  Patient received 3 doses of as needed Dilaudid  over the last 24 hours, a total of 1.5 mg.  Reviewed to assess needs for medication adjustment to optimize comfort.   Available advanced directives in ACP: None  The patient was visited at bedside, no family members present.  She was observed sleeping comfortably, breathing spontaneously and evenly, with no signs or gestures indicating pain or distress.   She was easily arousable but tended to fall back to sleep quickly.  When asked about her current condition, she stated, I am tired and lazy.  She reported feeling thirsty and was offered cold water, which she was able to sleep before returning to sleep.  Overall, she appeared chronically ill but remained comfortable.  Coordination with the bedside RN revealed that patient continues to have a poor appetite, and does not engage in meaningful conversation.  The goals of care remain unchanged, will continue to focus on comfort measures.  The team is currently awaiting DSS guardianship to facilitate admission to an [place] hospice.   Palliative Support Provided.   Objective Assessment: Vital Signs Vitals:   05/28/24 2036 05/29/24 0753  BP: 96/69 91/62  Pulse: 85 80  Resp: 17 16  Temp: 98.4 F (36.9 C) 97.6 F (36.4 C)  SpO2:      Intake/Output Summary (Last 24 hours) at 05/29/2024 9048 Last data filed at 05/28/2024 2106 Gross per 24 hour  Intake 363 ml  Output --  Net 363 ml   Last Weight  Most recent update: 05/26/2024  4:43 AM    Weight  45.7 kg (100 lb 12 oz)             Gen: Chronically ill-appearing, somnolent, appears comfortable HEENT: Mucous membranes are moist CV: Regular rate and rhythm PULM: Diminished, clear to auscultation bilaterally ABD: Soft and nontender EXT: No edema Neuro: Somnolent  SUMMARY OF RECOMMENDATIONS    # Complex medical decision-making/goals of care CODE STATUS: [name] Per [facility] RN, patient is appropriate to be admitted into their facility, however in need of appropriate person to sign patient  in. No family members or surrogate decision maker.  LSW filed APS report on 05/28/2024 to [facility] DSS, report then transferred to [facility].  Per TOC note, DSS to sign off on patient admitting to [facility].  Awaiting feedback from [facility] DSS. [name]   # Symptom  management    Acetaminophen  as needed for mild pain Hydromorphone  as needed for pain/air hunger/comfort, to adjust dose to optimize comfort as needed. Robinul  as needed for excessive secretions Ativan  as needed for agitation/anxiety Zofran  as needed for nausea Liquifilm tears as needed for dry eyes Haldol  as needed for agitation/anxiety Dulcolax as needed for constipation Benadryl  as needed for itching May have comfort feeding Oxygen as needed 2 L or less for comfort, no escalation, consider weaning off. Maintain Foley catheter for end-of-life care  # Psychosocial support Provided to patient  # Discharge planning  Likely inpatient hospice [facility] hospice) once DSS will sign patient in for admission.   Discussed treatment plan with bedside RN and medical team.    I personally spent a total of 35 minutes in the care of the patient today including preparing to see the patient, getting/reviewing separately obtained history, performing a medically appropriate exam/evaluation, counseling and educating, referring and communicating with other health care professionals, documenting clinical information in the EHR, independently interpreting results, and coordinating care.   ______________________________________________________________________________________ Kathlyne Bolder NP-C [place] Palliative Medicine Team Team Cell Phone: [phone] Please utilize secure chat with additional questions, if there is no response within 30 minutes please call the above phone number  Palliative Medicine Team providers are available by phone from 7am to 7pm daily and can be reached through the team cell phone.  Should this patient require assistance outside of these hours, please call the patient's attending physician.

## 2024-05-29 NOTE — Plan of Care (Signed)
  Problem: Clinical Measurements: Goal: Will remain free from infection Outcome: Progressing   Problem: Elimination: Goal: Will not experience complications related to bowel motility Outcome: Progressing Goal: Will not experience complications related to urinary retention Outcome: Progressing   Problem: Pain Managment: Goal: General experience of comfort will improve and/or be controlled Outcome: Progressing   Problem: Safety: Goal: Ability to remain free from injury will improve Outcome: Progressing

## 2024-05-29 NOTE — Plan of Care (Signed)

## 2024-05-30 DIAGNOSIS — A419 Sepsis, unspecified organism: Secondary | ICD-10-CM | POA: Diagnosis not present

## 2024-05-30 DIAGNOSIS — J189 Pneumonia, unspecified organism: Secondary | ICD-10-CM | POA: Diagnosis not present

## 2024-05-30 NOTE — Progress Notes (Signed)
 Palliative Medicine Inpatient Follow Up Note   HPI: Patient is a 68 year old female with medical history significant of ASCVD and myocardial infarction status post PCI October 2023, rheumatoid arthritis, hypertension, hyperlipidemia, CKD stage IIIb, SLP.  She was admitted with chest pain and difficulty breathing following a recent hospitalization for pneumonia.   Palliative team has been asked to support additional goals of care conversation in the setting of sepsis.   Hospital stay #8.  Patient is a 7-day readmission, 30-day readmission, and has had 2 inpatient admissions in the last 6 months.   Today's Discussion 05/30/2024   I have reviewed medical records including:  EPIC notes: Reviewed hospitalist progress notes from 05/30/2024.  No significant events overnight patient appears comfortable, denies any complaints.  On end-of-life care Vital signs: 05/30/2024 7:23 AM: Temperature 97.6 F, blood pressure 98/67 mmHg, pulse rate 88 bpm, respiration 14 breaths/min, O2 100% on room air.  Pain assessment score: 0 MAR: Reviewed PRN meds received over the last 24 hours.  Patient received Dilaudid  0.5 mg yesterday at 0439, and 1 mg at 2305.  Also noted the [name]  this morning at 0637. Reviewed to assess needs for medication adjustment to optimize comfort.  Available advanced directives in ACP: None   Visited patient at bedside. No family member present. Patient observed sleeping comfortably, breathing spontaneously and evenly. No signs or gestures indicating pain or distress. [name] to promote and optimize comfort and rest.  Goals of care unchanged. Continue with comfort-focused care. Awaiting for [place] DSS guardianship, in order for them to admit patient to [facility] in-patient facility.   Questions and concerns addressed   Palliative Support Provided.   Objective Assessment: Vital Signs Vitals:   05/29/24 2235 05/30/24 0723  BP:  (!) 84/61 98/64  Pulse:  88  Resp:  14  Temp:  97.6 F (36.4 C)  SpO2:     No intake or output data in the 24 hours ending 05/30/24 1429 Last Weight  Most recent update: 05/26/2024  4:43 AM    Weight  45.7 kg (100 lb 12 oz)             Gen: Chronically ill-appearing HEENT: Mucous membranes are moist CV: Regular rate and rhythm PULM: Clear, but diminished bilaterally ABD: Soft and nontender EXT: No edema Neuro: Grossly  SUMMARY OF RECOMMENDATIONS    # Complex medical decision-making/goals of care CODE STATUS: [name] Per [facility] hosp[facility] RN, patient is appropriate to be admitted into their facility, however in need of an appropriate person to sign patient in to their hospice facility. No family members or surrogate decision maker available at this time.  Social worker filed APS report on 05/28/2024, and hopes for DSS guardianship.  Awaiting feedback from [facility] DSS. [name].  # Symptom management Acetaminophen  as needed for mild pain Hydromorphone  as needed for pain/air hunger/comfort, to adjust dose to optimize comfort as needed Robinul  as needed for excessive secretions Ativan  as needed for agitation/anxiety Zofran  as needed for nausea Liquifilm Tears as needed for dry eyes Haldol  as needed for agitation/anxiety Dulcolax as needed for constipation Benadryl  as needed for itching May have comfort feeding Oxygen as needed for comfort, no escalation, consider weaning off Maintain Foley catheter for end-of-life care   # Psychosocial support Provided to patient  # Discharge planning Likely inpatient hospice [facility]) once DSS will sign patient in for admission.  Discussed with: Nursing, and medical team.  I personally spent a total of 35 minutes in the care of the patient today including preparing to see the patient, getting/reviewing separately obtained history, performing a medically appropriate  exam/evaluation, placing orders, documenting clinical information in the EHR, and coordinating care.      ______________________________________________________________________________________ Kathlyne Bolder NP-C [place] Palliative Medicine Team Team Cell Phone: [phone] Please utilize secure chat with additional questions, if there is no response within 30 minutes please call the above phone number  Palliative Medicine Team providers are available by phone from 7am to 7pm daily and can be reached through the team cell phone.  Should this patient require assistance outside of these hours, please call the patient's attending physician.

## 2024-05-30 NOTE — Plan of Care (Signed)
°  Problem: Education: Goal: Knowledge of General Education information will improve Description: Including pain rating scale, medication(s)/side effects and non-pharmacologic comfort measures Outcome: Progressing   Problem: Clinical Measurements: Goal: Ability to maintain clinical measurements within normal limits will improve Outcome: Progressing Goal: Will remain free from infection Outcome: Progressing Goal: Diagnostic test results will improve Outcome: Progressing Goal: Respiratory complications will improve Outcome: Progressing Goal: Cardiovascular complication will be avoided Outcome: Progressing   Problem: Clinical Measurements: Goal: Diagnostic test results will improve Outcome: Progressing   Problem: Clinical Measurements: Goal: Respiratory complications will improve Outcome: Progressing   Problem: Clinical Measurements: Goal: Will remain free from infection Outcome: Progressing   Problem: Pain Managment: Goal: General experience of comfort will improve and/or be controlled Outcome: Progressing   Problem: Safety: Goal: Ability to remain free from injury will improve Outcome: Progressing   Problem: Elimination: Goal: Will not experience complications related to bowel motility Outcome: Progressing Goal: Will not experience complications related to urinary retention Outcome: Progressing

## 2024-05-30 NOTE — Progress Notes (Signed)
 PROGRESS NOTE     Patient Demographics:    Cassidy Larson, is a 68 y.o. female, DOB - [birth year], FMW:[id number]  Outpatient Primary MD for the patient is [name], FNP    LOS - 8  Admit date - 05/22/2024    Chief Complaint  Patient presents with   Chest Pain       Brief Narrative (HPI from H&P)     68 y.o. 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   She was  just recently been hospitalized from 11/29-12/4 with concern for sepsis thought possibly secondary to a left-sided bacterial community-acquired pneumonia.  Blood cultures were negative and patient was treated with IV cefepime  and transition to oral Augmentin  to complete a 5-day course at discharge. Records note urine Legionella cultures were positive from admission the last admission.   In the ER her workup was consistent with NSTEMI, CHF with acute hypoxic respiratory failure, possible worsening pneumonia and she was admitted to the hospital for further management, please see discussion below.    Subjective:   No significant events overnight, she denies any complaints today   Assessment  & Plan :   Acute respiratory failure with hypoxia Sepsis due to community-acquired pneumonia, recent Legionella pneumonia - Patient was recently diagnosed with Legionella pneumonia, CT scan shows worsening pulmonary edema and pneumonia. - Treated with appropriate antibiotic azithromycin  and Augmentin . - Encouraged use incentive spirometry and flutter valve. - Still with  significant oxygen requirement, still 5 to 6 L nasal cannula.  NSTEMI Acute on chronic diastolic congestive heart failure exacerbation -With evidence of volume overload, suspected acute on chronic.  - CTA of the chest noted concern for pulmonary edema with signs of right heart failure.  Echo noted with EF 60% and stable wall motion as reported. - NSTEMI, likely type II in the setting of respiratory failure, hypoxia with underlying CAD. - Finished heparin  drip x 48 hours. 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   -Anemia panel suggestive of iron deficiency, placed on replacement, on PPI no signs of ongoing bleeding, poor candidate for EGD or colonoscopy at this time    Prolonged QT interval Chronic.  QTc 512 - Monitor electrolytes, minimize QT prolonging medications as much as practically possible, due to recent Legionella pneumonia and worsening pneumonia on CT despite treatment before azithromycin  x 7 days.   Systemic lupus Review of records note patient with a history of positive ANA back in 2019.  Previously had been on Plaquenil   Mixed hyperlipidemia - Continue Lipitor    Dysthymic disorder - Continue Zoloft  but held mirtazapine   Severe mitral valve regurgitation -Not a surgical candidate  Elevated liver enzymes Acute.  AST 47 and ALT 52.  Possibly secondary to passive congestion from patient being fluid overload. - Continue to monitor   Tobacco abuse Patient just recently stopped smoking in the last week. - Continue to encourage cessation of tobacco use  Incidental finding on CT.   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 Consider follow-up imaging with repeat CT of the chest in 1 to 3 months to ensure stability and/or resolution.  PCP to monitor.   Goals of care discussion End-of-life care - Patient with very poor life quality, severely deconditioned, at this point she is currently dyspneic, uncomfortable with recurrent chest pain, though unlikely cardiac, she is requesting multiple doses of pain regimen due to discomfort, dyspnea, palliative care involved, medicine input greatly appreciated, no family could be located, but patient was clear about her decisions not to pursue any aggressive measures, at this point wants to focus on comfort, she is at end-of-life, she is significantly dyspneic, hypoxic, frail, with poor appetite and oral intake, failure to thrive.  Plan for discharge to facility with hospice, social worker filed APS report on 05/28/2024 she can be transferred back to her facility with [facility]       Condition - Extremely Guarded  Family Communication  : No family, legal guardian listed as Mrs. Ascencion [phone], she was called on 05/23/2024 at 8:30 AM, she informs me that she is not a legal guardian or POA she is simply a production designer, theatre/television/film of the facility where patient lives, she confirms patient has no family and decides for herself.  Code Status : DNR  Consults  : Cardiology, palliative care  PUD Prophylaxis : PPI.   Procedures  :     TTE -  1. Left ventricular ejection fraction, by estimation, is 60 to 65%. The left ventricle has normal function. The left ventricle has no regional wall motion abnormalities. Left ventricular diastolic parameters are indeterminate.  2. Right ventricular systolic function is normal. The right ventricular size is normal.  3. Shadowing artifact in LA from MAC. Restricted posterior leaflet motion. Consider f/u TEE to further evaluate the degree of MR and MV morphology. The mitral valve is  abnormal. Severe mitral valve regurgitation. No evidence of mitral stenosis. Severe mitral annular calcification.  4. Tricuspid valve regurgitation is mild to moderate.  5. The aortic valve is normal in structure. Aortic valve regurgitation is not visualized. No aortic stenosis is  present.  6. The inferior vena cava is normal in size with greater than 50% respiratory variability, suggesting right atrial pressure of 3 mmHg.  CT - 1. No evidence of acute aortic syndrome. 2. Progressive left upper lobe pneumonia. 3. Diffuse pulmonary edema and small left pleural effusion. Reflux of contrast material into the IVC and hepatic veins consistent with right heart failure. 4. No central pulmonary arterial occlusion. 5. Enlarging mediastinal lymph nodes are nonspecific in the setting of chf and pneumonia. 6. 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 Consider follow-up imaging with repeat CT of the chest in 1 to 3 months to ensure stability and/or resolution. 7. Aortic atherosclerotic calcifications and coronary artery calcifications. 8. Progressive increased size of bilateral inguinal and left external iliac lymph nodes compared with 05/17/2024. Nonspecific but favor reactive changes.      Disposition Plan  :    Status is: Inpatient  DVT Prophylaxis  : Comfort   Lab Results  Component Value Date   PLT 424 (H) 05/26/2024    Diet :  Diet Order             DIET DYS 3 Room service appropriate? Yes; Fluid consistency: Thin  Diet effective now                    Inpatient Medications  Scheduled Meds:  feeding supplement  237 mL Oral BID BM   Gerhardt's butt cream   Topical BID   guaiFENesin   600 mg Oral BID   lidocaine   1 patch Transdermal Q24H   sertraline   100 mg Oral Daily   sodium chloride  flush  3 mL Intravenous Q12H   Continuous Infusions:   PRN Meds:.acetaminophen  **OR** acetaminophen , albuterol , antiseptic  oral rinse, artificial tears, bisacodyl , diphenhydrAMINE , glycopyrrolate  **OR** glycopyrrolate  **OR** glycopyrrolate , haloperidol  **OR** haloperidol  **OR** haloperidol  lactate, HYDROmorphone  (DILAUDID ) injection, loperamide , LORazepam , ondansetron  **OR** ondansetron  (ZOFRAN ) IV, phenol, polyethylene glycol, trimethobenzamide     Objective:   Vitals:   05/28/24 2036 05/29/24 0753 05/29/24 2235 05/30/24 0723  BP: 96/69 91/62 (!) 84/61 98/64  Pulse: 85 80  88  Resp: 17 16  14   Temp: 98.4 F (36.9 C) 97.6 F (36.4 C)  97.6 F (36.4 C)  TempSrc: Oral Oral  Oral  SpO2:      Weight:      Height:        Wt Readings from Last 3 Encounters:  05/26/24 45.7 kg  05/15/24 45.2 kg  10/20/22 43.5 kg    No intake or output data in the 24 hours ending 05/30/24 1335    Physical Exam  Alert, comfortable, no apparent distress  Good air entry  Abdomen soft        Data Review:    Recent Labs  Lab 05/24/24 0739 05/25/24 0407 05/26/24 0407  WBC 17.4* 15.6* 16.8*  HGB 8.4* 8.4* 9.3*  HCT 25.6* 25.2* 28.3*  PLT 367 378 424*  MCV 91.8 92.6 93.7  MCH 30.1 30.9 30.8  MCHC 32.8 33.3 32.9  RDW 14.5 14.3 14.7  LYMPHSABS 0.9 1.5 1.5  MONOABS 1.2* 1.4* 1.8*  EOSABS 0.3 0.3 0.2  BASOSABS 0.0 0.0 0.1    Recent Labs  Lab 05/24/24 0739 05/25/24 0407 05/26/24 0407  NA 139 140 141  K 4.2 3.9 4.4  CL 113* 114* 114*  CO2 15* 12* 19*  ANIONGAP 11 14 8   GLUCOSE 108* 148* 126*  BUN 26* 34* 43*  CREATININE 1.67* 1.71*  1.80*  CRP 10.8* 11.1* 8.8*  PROCALCITON 0.15 0.12 0.12  BNP 2,005.7* 1,432.5* 1,815.1*  MG 2.1 1.8 2.1  CALCIUM  7.9* 8.1* 8.4*      Recent Labs  Lab 05/24/24 0739 05/25/24 0407 05/26/24 0407  CRP 10.8* 11.1* 8.8*  PROCALCITON 0.15 0.12 0.12  BNP 2,005.7* 1,432.5* 1,815.1*  MG 2.1 1.8 2.1  CALCIUM  7.9* 8.1* 8.4*    --------------------------------------------------------------------------------------------------------------- Lab Results  Component Value  Date   CHOL 95 05/22/2024   HDL 18 (L) 05/22/2024   LDLCALC 43 05/22/2024   TRIG 173 (H) 05/22/2024   CHOLHDL 5.3 05/22/2024    Lab Results  Component Value Date   HGBA1C 5.8 (H) 05/22/2024   No results for input(s): TSH, T4TOTAL, FREET4, T3FREE, THYROIDAB in the last 72 hours. No results for input(s): VITAMINB12, FOLATE, FERRITIN, TIBC, IRON, RETICCTPCT in the last 72 hours.  ------------------------------------------------------------------------------------------------------------------ Cardiac Enzymes No results for input(s): CKMB, TROPONINI, MYOGLOBIN in the last 168 hours.  Invalid input(s): CK  Micro Results Recent Results (from the past 240 hours)  MRSA Next Gen by PCR, Nasal     Status: None   Collection Time: 05/22/24  7:03 AM  Result Value Ref Range Status   MRSA by PCR Next Gen NOT DETECTED NOT DETECTED Final    Comment: (NOTE) The GeneXpert MRSA Assay (FDA approved for NASAL specimens only), is one component of a comprehensive MRSA colonization surveillance program. It is not intended to diagnose MRSA infection nor to guide or monitor treatment for MRSA infections. Test performance is not FDA approved in patients less than 63 years old. Performed at [facility], [address] [place], [place] [address]   Blood culture (routine x 2)     Status: None   Collection Time: 05/22/24 10:55 AM   Specimen: BLOOD RIGHT ARM  Result Value Ref Range Status   Specimen Description BLOOD RIGHT ARM  Final   Special Requests   Final    BOTTLES DRAWN AEROBIC AND ANAEROBIC Blood Culture results may not be optimal due to an inadequate volume of blood received in culture bottles   Culture   Final    NO GROWTH 5 DAYS Performed at [facility], [address], [place], [place] [address]    Report Status 05/27/2024 FINAL  Final  Blood culture (routine x 2)     Status: None   Collection Time: 05/22/24 11:00 AM   Specimen: BLOOD LEFT ARM   Result Value Ref Range Status   Specimen Description BLOOD LEFT ARM  Final   Special Requests   Final    BOTTLES DRAWN AEROBIC AND ANAEROBIC Blood Culture results may not be optimal due to an inadequate volume of blood received in culture bottles   Culture   Final    NO GROWTH 5 DAYS Performed at [facility], [address], [place], [place] [address]    Report Status 05/27/2024 FINAL  Final    Radiology Report No results found.    Signature  -   Brayton Lye M.D on 05/30/2024 at 1:35 PM   -  To page go to [email or web address]

## 2024-05-31 MED ORDER — DIPHENHYDRAMINE HCL 50 MG/ML IJ SOLN
12.5000 mg | Freq: Once | INTRAMUSCULAR | Status: AC
  Administered 2024-06-01: 01:00:00 12.5 mg via INTRAVENOUS
  Filled 2024-05-31: qty 1

## 2024-05-31 NOTE — TOC Progression Note (Signed)
 Transition of Care [facility]) - Progression Note    Patient Details  Name: Cassidy Larson MRN: [id number] Date of Birth: 01/08/1956  Transition of [facility] CM/SW Contact  Inocente GORMAN Kindle, LCSW Phone Number: 05/31/2024, 10:34 AM  Clinical Narrative:    [facility] APS has accepted the case and is pending a engineer, site.    Expected Discharge Plan: Hospice Medical Facility Barriers to Discharge: Other (must enter comment), Family Issues, DSS Guardianship               Expected Discharge Plan and Services In-house Referral: Clinical Social Work, Hospice / Palliative Care   Post Acute Care Choice: Hospice Living arrangements for the past 2 months: Group Home                                       Social Drivers of Health (SDOH) Interventions SDOH Screenings   Food Insecurity: No Food Insecurity (05/22/2024)  Housing: Unknown (05/22/2024)  Transportation Needs: No Transportation Needs (05/22/2024)  Recent Concern: Transportation Needs - Unmet Transportation Needs (05/15/2024)  Utilities: Not At Risk (05/22/2024)  Financial Resource Strain: Not on File (11/14/2022)   Received from [facility]  Physical Activity: Not on File (11/14/2022)   [facility] from [facility]  Social Connections: Socially Isolated (05/22/2024)  Stress: Not on File (11/14/2022)   Received from [facility]  Tobacco Use: High Risk (05/15/2024)    Readmission Risk Interventions    05/16/2024    2:59 PM  Readmission Risk Prevention Plan  Transportation Screening Complete  PCP or Specialist Appt within 5-7 Days Not Complete  Home Care Screening Complete  Medication Review (RN CM) Complete

## 2024-05-31 NOTE — Progress Notes (Signed)
 PROGRESS NOTE     Patient Demographics:    Cassidy Larson, is a 68 y.o. female, DOB - 09-06-1955, FMW:[id number]  Outpatient Primary MD for the patient is [name], FNP    LOS - 9  Admit date - 05/22/2024    Chief Complaint  Patient presents with   Chest Pain       Brief Narrative (HPI from H&P)     68 y.o. 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   She was  just recently been hospitalized from 11/29-12/4 with concern for sepsis thought possibly secondary to a left-sided bacterial community-acquired pneumonia.  Blood cultures were negative and patient was treated with IV cefepime  and transition to oral Augmentin  to complete a 5-day course at discharge. Records note urine Legionella cultures were positive from admission the last admission.   In the ER her workup was consistent with NSTEMI, CHF with acute hypoxic respiratory failure, possible worsening pneumonia and she was admitted to the hospital for further management, please see discussion below.    Subjective:   No significant events overnight, she denies any complaints   Assessment  & Plan :   Acute respiratory failure with hypoxia Sepsis due to community-acquired pneumonia, recent Legionella pneumonia - Patient was recently diagnosed with Legionella pneumonia, CT scan shows worsening pulmonary edema and pneumonia. - Treated with appropriate antibiotic azithromycin  and Augmentin . - Encouraged use incentive spirometry and flutter valve. - He is with significant  oxygen requirement 5 to 6 L this has improved, currently on room air.  NSTEMI Acute on chronic diastolic congestive heart failure exacerbation -With evidence of volume overload, suspected acute on chronic.  - CTA of the chest noted concern for pulmonary edema with signs of right heart failure.  Echo noted with EF 60% and stable wall motion as reported. - NSTEMI, likely type II in the setting of respiratory failure, hypoxia with underlying CAD. - Finished heparin  drip x 48 hours. 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   -Anemia panel suggestive of iron deficiency, placed on replacement, on PPI no signs of ongoing bleeding, poor candidate for EGD or colonoscopy at this time    Prolonged QT interval Chronic.  QTc 512 - Monitor electrolytes, minimize QT prolonging medications as much as practically possible, due to recent Legionella pneumonia and worsening pneumonia on CT despite treatment before azithromycin  x 7 days.   Systemic lupus Review of records note patient with a history of positive ANA back in 2019.  Previously had been on Plaquenil   Mixed hyperlipidemia - Continue Lipitor    Dysthymic disorder - Continue Zoloft  but held mirtazapine   Severe mitral valve regurgitation -Not a surgical candidate  Elevated liver enzymes Acute.  AST 47 and ALT 52.  Possibly secondary to passive congestion from patient being fluid overload. - Continue to monitor   Tobacco abuse Patient just recently stopped smoking in the last week. - Continue to encourage cessation of tobacco use  Incidental finding on  CT.  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 Consider follow-up imaging with repeat CT of the chest in 1 to 3 months to ensure stability and/or resolution.  PCP to monitor.   Goals of care discussion End-of-life care - Patient with very poor life quality, severely deconditioned, at this point she is currently dyspneic, uncomfortable with recurrent chest pain, though unlikely cardiac, she is requesting multiple doses of pain regimen due to discomfort, dyspnea, palliative care involved, medicine input greatly appreciated, no family could be located, but patient was clear about her decisions not to pursue any aggressive measures, at this point wants to focus on comfort, she is at end-of-life, she is significantly dyspneic, hypoxic, frail, with poor appetite and oral intake, failure to thrive.  Plan for discharge to facility with hospice, social worker filed APS report on 05/28/2024 she can be transferred back to her facility with [facility]       Condition - Extremely Guarded  Family Communication  : No family, legal guardian listed as Mrs. Ascencion [phone], she was called on 05/23/2024 at 8:30 AM, she informs me that she is not a legal guardian or POA she is simply a production designer, theatre/television/film of the facility where patient lives, she confirms patient has no family and decides for herself.  Code Status : DNR  Consults  : Cardiology, palliative care  PUD Prophylaxis : PPI.   Procedures  :     TTE -  1. Left ventricular ejection fraction, by estimation, is 60 to 65%. The left ventricle has normal function. The left ventricle has no regional wall motion abnormalities. Left ventricular diastolic parameters are indeterminate.  2. Right ventricular systolic function is normal. The right ventricular size is normal.  3. Shadowing artifact in LA from MAC. Restricted posterior leaflet motion. Consider f/u TEE to further evaluate the degree of MR and MV morphology. The mitral valve is  abnormal. Severe mitral valve regurgitation. No evidence of mitral stenosis. Severe mitral annular calcification.  4. Tricuspid valve regurgitation is mild to moderate.  5. The aortic valve is normal in structure. Aortic valve regurgitation is not visualized.  No aortic stenosis is present.  6. The inferior vena cava is normal in size with greater than 50% respiratory variability, suggesting right atrial pressure of 3 mmHg.  CT - 1. No evidence of acute aortic syndrome. 2. Progressive left upper lobe pneumonia. 3. Diffuse pulmonary edema and small left pleural effusion. Reflux of contrast material into the IVC and hepatic veins consistent with right heart failure. 4. No central pulmonary arterial occlusion. 5. Enlarging mediastinal lymph nodes are nonspecific in the setting of chf and pneumonia. 6. 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 Consider follow-up imaging with repeat CT of the chest in 1 to 3 months to ensure stability and/or resolution. 7. Aortic atherosclerotic calcifications and coronary artery calcifications. 8. Progressive increased size of bilateral inguinal and left external iliac lymph nodes compared with 05/17/2024. Nonspecific but favor reactive changes.      Disposition Plan  :    Status is: Inpatient  DVT Prophylaxis  : Comfort   Lab Results  Component Value Date   PLT 424 (H) 05/26/2024    Diet :  Diet Order             DIET DYS 3 Room service appropriate? Yes; Fluid consistency: Thin  Diet effective now                    Inpatient Medications  Scheduled Meds:  feeding supplement  237 mL Oral BID BM   Gerhardt's butt cream   Topical BID   guaiFENesin   600 mg Oral BID   lidocaine   1 patch Transdermal Q24H   sertraline   100 mg Oral Daily   sodium chloride  flush  3 mL Intravenous Q12H   Continuous Infusions:   PRN Meds:.acetaminophen  **OR** acetaminophen , albuterol , antiseptic  oral rinse, artificial tears, bisacodyl , diphenhydrAMINE , glycopyrrolate  **OR** glycopyrrolate  **OR** glycopyrrolate , haloperidol  **OR** haloperidol  **OR** haloperidol  lactate, HYDROmorphone  (DILAUDID ) injection, loperamide , LORazepam , ondansetron  **OR** ondansetron  (ZOFRAN ) IV, phenol, polyethylene glycol, trimethobenzamide     Objective:   Vitals:   05/29/24 0753 05/29/24 2235 05/30/24 0723 05/31/24 0830  BP: 91/62 (!) 84/61 98/64 (!) 88/56  Pulse: 80  88 90  Resp: 16  14 14   Temp: 97.6 F (36.4 C)  97.6 F (36.4 C) 97.6 F (36.4 C)  TempSrc: Oral  Oral Oral  SpO2:      Weight:      Height:        Wt Readings from Last 3 Encounters:  05/26/24 45.7 kg  05/15/24 45.2 kg  10/20/22 43.5 kg    No intake or output data in the 24 hours ending 05/31/24 1156    Physical Exam  Awake, more conversant today, appears comfortable, oriented x 2 Good air entry, no wheezing Abdomen soft No edema       Data Review:    Recent Labs  Lab 05/25/24 0407 05/26/24 0407  WBC 15.6* 16.8*  HGB 8.4* 9.3*  HCT 25.2* 28.3*  PLT 378 424*  MCV 92.6 93.7  MCH 30.9 30.8  MCHC 33.3 32.9  RDW 14.3 14.7  LYMPHSABS 1.5 1.5  MONOABS 1.4* 1.8*  EOSABS 0.3 0.2  BASOSABS 0.0 0.1    Recent Labs  Lab 05/25/24 0407 05/26/24 0407  NA 140 141  K 3.9 4.4  CL 114* 114*  CO2 12* 19*  ANIONGAP 14 8  GLUCOSE 148* 126*  BUN 34* 43*  CREATININE 1.71* 1.80*  CRP 11.1* 8.8*  PROCALCITON 0.12 0.12  BNP 1,432.5* 1,815.1*  MG 1.8 2.1  CALCIUM  8.1* 8.4*      Recent Labs  Lab 05/25/24 0407 05/26/24 0407  CRP 11.1* 8.8*  PROCALCITON 0.12 0.12  BNP 1,432.5* 1,815.1*  MG 1.8 2.1  CALCIUM  8.1* 8.4*    --------------------------------------------------------------------------------------------------------------- Lab Results  Component Value Date   CHOL 95 05/22/2024   HDL 18 (L) 05/22/2024   LDLCALC 43 05/22/2024   TRIG 173 (H) 05/22/2024   CHOLHDL 5.3 05/22/2024    Lab Results   Component Value Date   HGBA1C 5.8 (H) 05/22/2024   No results for input(s): TSH, T4TOTAL, FREET4, T3FREE, THYROIDAB in the last 72 hours. No results for input(s): VITAMINB12, FOLATE, FERRITIN, TIBC, IRON, RETICCTPCT in the last 72 hours.  ------------------------------------------------------------------------------------------------------------------ Cardiac Enzymes No results for input(s): CKMB, TROPONINI, MYOGLOBIN in the last 168 hours.  Invalid input(s): CK  Micro Results Recent Results (from the past 240 hours)  MRSA Next Gen by PCR, Nasal     Status: None   Collection Time: 05/22/24  7:03 AM  Result Value Ref Range Status   MRSA by PCR Next Gen NOT DETECTED NOT DETECTED Final    Comment: (NOTE) The GeneXpert MRSA Assay (FDA approved for NASAL specimens only), is one component of a comprehensive MRSA colonization surveillance program. It is not intended to diagnose MRSA infection nor to guide or monitor treatment for MRSA infections. Test performance is not FDA approved in patients less than 75 years old. Performed at [facility], [address], [place], [place] [address]   Blood culture (routine x 2)     Status: None   Collection Time: 05/22/24 10:55 AM   Specimen: BLOOD RIGHT ARM  Result Value Ref Range Status   Specimen Description BLOOD RIGHT ARM  Final   Special Requests   Final    BOTTLES DRAWN AEROBIC AND ANAEROBIC Blood Culture results may not be optimal due to an inadequate volume of blood received in culture bottles   Culture   Final    NO GROWTH 5 DAYS Performed at [facility], [address], [place], [place] [address]    Report Status 05/27/2024 FINAL  Final  Blood culture (routine x 2)     Status: None   Collection Time: 05/22/24 11:00 AM   Specimen: BLOOD LEFT ARM  Result Value Ref Range Status   Specimen Description BLOOD LEFT ARM  Final   Special Requests   Final    BOTTLES DRAWN AEROBIC AND ANAEROBIC  Blood Culture results may not be optimal due to an inadequate volume of blood received in culture bottles   Culture   Final    NO GROWTH 5 DAYS Performed at [facility], [address] [place], [place] [address]    Report Status 05/27/2024 FINAL  Final    Radiology Report No results found.    Signature  -   Brayton Lye M.D on 05/31/2024 at 11:56 AM   -  To page go to [email or web address]

## 2024-05-31 NOTE — Plan of Care (Signed)
°  Problem: Education: Goal: Knowledge of General Education information will improve Description: Including pain rating scale, medication(s)/side effects and non-pharmacologic comfort measures Outcome: Progressing   Problem: Health Behavior/Discharge Planning: Goal: Ability to manage health-related needs will improve Outcome: Progressing   Problem: Clinical Measurements: Goal: Ability to maintain clinical measurements within normal limits will improve Outcome: Progressing Goal: Will remain free from infection Outcome: Progressing Goal: Diagnostic test results will improve Outcome: Progressing Goal: Respiratory complications will improve Outcome: Progressing Goal: Cardiovascular complication will be avoided Outcome: Progressing   Problem: Activity: Goal: Risk for activity intolerance will decrease Outcome: Progressing   Problem: Nutrition: Goal: Adequate nutrition will be maintained Outcome: Progressing   Problem: Coping: Goal: Level of anxiety will decrease Outcome: Progressing   Problem: Elimination: Goal: Will not experience complications related to bowel motility Outcome: Progressing Goal: Will not experience complications related to urinary retention Outcome: Progressing   Problem: Pain Managment: Goal: General experience of comfort will improve and/or be controlled Outcome: Progressing   Problem: Safety: Goal: Ability to remain free from injury will improve Outcome: Progressing   Problem: Skin Integrity: Goal: Risk for impaired skin integrity will decrease Outcome: Progressing   Problem: Education: Goal: Knowledge of the prescribed therapeutic regimen will improve Outcome: Progressing   Problem: Coping: Goal: Ability to identify and develop effective coping behavior will improve Outcome: Progressing   Problem: Clinical Measurements: Goal: Quality of life will improve Outcome: Progressing   Problem: Respiratory: Goal: Verbalizations of increased ease  of respirations will increase Outcome: Progressing   Problem: Role Relationship: Goal: Family's ability to cope with current situation will improve Outcome: Progressing Goal: Ability to verbalize concerns, feelings, and thoughts to partner or family member will improve Outcome: Progressing   Problem: Pain Management: Goal: Satisfaction with pain management regimen will improve Outcome: Progressing   Problem: Clinical Measurements: Goal: Quality of life will improve Outcome: Progressing   Problem: Pain Management: Goal: Satisfaction with pain management regimen will improve Outcome: Progressing

## 2024-05-31 NOTE — Progress Notes (Signed)
 Daily Progress Note   Date: 05/31/2024   Patient Name: Cassidy Larson  DOB: 07-09-1955  MRN: [id number]  Age / Sex: 68 y.o., female  Attending Physician: Cassidy Brayton RAMAN, Larson Primary Care Physician: Cassidy Lovena Mason, FNP Admit Date: 05/22/2024 Length of Stay: 9 days  Reason for Follow-up: Establishing goals of care, Non pain symptom management, Pain control, and Terminal Care  Past Medical History:  Diagnosis Date   Anxiety    Blood transfusion without reported diagnosis    CAD in native artery residual post LAD stent, 100% RCA and 80% LCx.  03/30/2022   CHF (congestive heart failure) (HCC)    CKD (chronic kidney disease) stage 4, GFR 15-29 ml/min (HCC)    Cocaine abuse (HCC)    Essential hypertension    HLD (hyperlipidemia) 03/30/2022   Lupus    On mechanically assisted ventilation (HCC) extubated 03/28/22  03/28/2022   Polysubstance abuse (HCC)    Positive ANA (antinuclear antibody)    S/P angioplasty with stent to mLAD 03/27/22 03/30/2022    Assessment & Plan:   HPI/Patient Profile:  68 year old female with medical history significant of ASCVD and myocardial infarction status post PCI October 2023, rheumatoid arthritis, hypertension, hyperlipidemia, CKD stage IIIb, SLP.  She was admitted with chest pain and difficulty breathing following a recent hospitalization for pneumonia.   Palliative team has been asked to support additional goals of care conversation in the setting of sepsis.  SUMMARY OF RECOMMENDATIONS DNR-comfort Pending DSS and caseworker assignment to sign agreement for [facility] Symptom management as below  Symptom Management:  Acetaminophen  as needed for mild pain Hydromorphone  as needed for pain/air hunger/comfort, to adjust dose to optimize comfort as needed Robinul  as needed for excessive secretions Ativan  as needed for agitation/anxiety Zofran  as needed for nausea Liquifilm Tears as needed for dry eyes Haldol  as needed for  agitation/anxiety Dulcolax as needed for constipation Benadryl  as needed for itching Phenol spray as needed for sore throat May have comfort feeding Oxygen as needed for comfort, no escalation, consider weaning off Maintain Foley catheter for end-of-life care  Code Status: DNR - Comfort  Prognosis: < 2 weeks  Discharge Planning: Hospice facility  Discussed with: Cassidy Larson 05/31/2024 about patient's current pending placement for hospice due to DSS and caseworker assignment.   Subjective:   Subjective: Chart Reviewed. Updates received. Patient Assessed. Created space and opportunity for patient  and family to explore thoughts and feelings regarding current medical situation.  Received dilaudid  1 mg x1 this shift. Patient on room air. Vital signs stable with BP at 88/56 and on room air. Continues to have poor PO intake.   Today's Discussion:  Met with patient without any visitors bedside. Patient resting comfortably without any complaints during visit.   Review of Systems  Constitutional:  Positive for fatigue.  All other systems reviewed and are negative.   Objective:   Primary Diagnoses: Present on Admission:  Sepsis due to pneumonia (HCC)  Chest pain  Elevated troponin  Transient hypotension  Acute on chronic systolic CHF (congestive heart failure) (HCC)  Acute respiratory failure with hypoxia (HCC)  Normocytic anemia  CKD (chronic kidney disease), stage III (HCC)  CAD in native artery residual post LAD stent, 100% RCA and 80% LCx.   HLD (hyperlipidemia)  Tobacco abuse  Prolonged QT interval  Dysthymic disorder  Lupus (systemic lupus erythematosus) (HCC)   Vital Signs:  BP (!) 88/56 (BP Location: Left Arm)   Pulse 90   Temp 97.6 F (36.4 C) (Oral)  Resp 14   Ht 5' (1.524 m)   Wt 45.7 kg   SpO2 100%   BMI 19.68 kg/m   Physical Exam Constitutional:      Appearance: She is ill-appearing.     Comments: Somnolent but awakes to voice.   HENT:      Head: Normocephalic.  Cardiovascular:     Rate and Rhythm: Normal rate.  Pulmonary:     Effort: Pulmonary effort is normal.     Comments: Room air Skin:    General: Skin is warm.  Neurological:     Mental Status: She is disoriented.    Palliative Assessment/Data: 20%   Existing Vynca/ACP Documentation: None  Thank you for allowing us  to participate in the care of Cassidy Larson PMT will continue to support holistically.  I personally spent a total of 25 minutes in the care of the patient today including preparing to see the patient, performing a medically appropriate exam/evaluation, referring and communicating with other health care professionals, and documenting clinical information in the EHR.   Cassidy Larson  Palliative Medicine Team  Team Phone # [phone] (Nights/Weekends) 05/31/2024 2:11 PM

## 2024-06-01 MED ORDER — CHLORHEXIDINE GLUCONATE CLOTH 2 % EX PADS
6.0000 | MEDICATED_PAD | Freq: Every day | CUTANEOUS | Status: DC
  Administered 2024-06-01 – 2024-06-03 (×3): 6 via TOPICAL

## 2024-06-01 NOTE — Progress Notes (Signed)
 PROGRESS NOTE        PATIENT DETAILS Name: Cassidy Larson Age: 68 y.o. Sex: female Date of Birth: 07-05-1955 Admit Date: 05/22/2024 Admitting Physician Maximino DELENA Sharps, MD ERE:FrWzpo, Lovena Mason, FNP  Brief Summary: Patient is a 68 y.o.  female with history of CAD s/p PCI October 2023, HTN, HLD, CKD stage IIIb, SLE, RA presented with chest pain/shortness of breath-felt to be secondary to progressive PNA/HFpEF exacerbation-hospital course complicated by persistent severe chest pain-failure to thrive syndrome-debility-after extensive discussion with patient-transitioned to full comfort measures.  Significant events: 11/29-12/4>> hospitalization for sepsis secondary to PNA 12/6>> admit to [facility]-chest pain 12/10>> transitioned to comfort care  Significant studies: 12/6>> CT angio chest/abdomen/pelvis: No evidence of acute aortic syndrome, no central PE-progressive left upper lobe pneumonia-diffuse pulmonary edema 12/6>> echo: EF 60-65%, severe mitral valve regurgitation, mild to moderate tricuspid valve vegetation 12/8>> CT chest: Interval progression of dense consolidation left upper lobe-with more confluent airspace opacity posterior left upper lobe.  Interval progression of bilateral lower lobe consolidation.  Significant microbiology data: 12/6>> blood culture: No growth 12/6>> urine Legionella antigen: Positive  Procedures: None  Consults: Cardiology Palliative care  Subjective: Lying comfortably in bed-denies any chest pain or shortness of breath.  Objective: Vitals: Blood pressure (!) 142/106, pulse 90, temperature (!) 97.5 F (36.4 C), resp. rate 16, height 5' (1.524 m), weight 45.7 kg, SpO2 100%.   Exam: Gen Exam:Alert awake-not in any distress HEENT:atraumatic, normocephalic Chest: B/L clear to auscultation anteriorly CVS:S1S2 regular Abdomen:soft non tender, non distended Extremities:no edema Neurology: Non focal Skin: no  rash  Pertinent Labs/Radiology:    Latest Ref Rng & Units 05/26/2024    4:07 AM 05/25/2024    4:07 AM 05/24/2024    7:39 AM  CBC  WBC 4.0 - 10.5 K/uL 16.8  15.6  17.4   Hemoglobin 12.0 - 15.0 g/dL 9.3  8.4  8.4   Hematocrit 36.0 - 46.0 % 28.3  25.2  25.6   Platelets 150 - 400 K/uL 424  378  367     Lab Results  Component Value Date   NA 141 05/26/2024   K 4.4 05/26/2024   CL 114 (H) 05/26/2024   CO2 19 (L) 05/26/2024      Assessment/Plan: Acute hypoxic respiratory failure Secondary to PNA/HFpEF exacerbation FiO2 requirements have improved somewhat with supportive care Has been transitioned to full comfort measures-see below  Sepsis secondary to Legionella pneumonia Unfortunately-worsened in spite of receiving appropriate antimicrobial therapy Has been transitioned to full comfort measures  HFpEF exacerbation Treated with Lasix -volume status improved Due to continued deterioration-has been transitioned to full comfort measures-longer on diuretics  Non-STEMI History of CAD-requiring PCI October 2023 Initially medically managed-completed IV heparin  x 48 hours Continues to have intermittent chest pain-which is likely musculoskeletal Due to continued deterioration-now transitioned to full comfort measures  Prolonged QTc Supportive care  HLD No longer on statin-comfort care  Multifactorial anemia Secondary to chronic disease/iron deficiency To be a good candidate for endoscopy evaluation Has been transitioned to full comfort measures  History of SLE No longer on DMARD-comfort care  History of tobacco abuse  Mood disorder Remeron -comfort care  2.3 cm left adrenal nodule Incidental finding on CT imaging Radiology-unchanged from 2011-likely a benign adenoma  Palliative care Severely deconditioned-poor quality of life-was dyspneic-uncomfortable with recurrent chest pain-requiring multiple doses of narcotics-no family located-but per prior  notes-patient was clear  about her decision not to pursue any aggressive measures-evaluated by palliative care-comfort measures in place-arrangements ongoing with APS/our [facility] team to see if we can get her to.  Underweight: Estimated body mass index is 19.68 kg/m as calculated from the following:   Height as of this encounter: 5' (1.524 m).   Weight as of this encounter: 45.7 kg.   Code status:   Code Status: Do not attempt resuscitation (DNR) - Comfort care   DVT Prophylaxis: Not needed-comfort care  Family Communication: None at bedside   Disposition Plan: Status is: Inpatient Remains inpatient appropriate because: Severity of illness   Planned Discharge Destination: Hospice care   Diet: Diet Order             DIET DYS 3 Room service appropriate? Yes; Fluid consistency: Thin  Diet effective now                     Antimicrobial agents: Anti-infectives (From admission, onward)    Start     Dose/Rate Route Frequency Ordered Stop   05/24/24 1000  azithromycin  (ZITHROMAX ) tablet 500 mg  Status:  Discontinued        500 mg Oral Daily 05/23/24 0849 05/26/24 1412   05/23/24 1300  ceFEPIme  (MAXIPIME ) 2 g in sodium chloride  0.9 % 100 mL IVPB  Status:  Discontinued        2 g 200 mL/hr over 30 Minutes Intravenous Every 24 hours 05/22/24 1309 05/23/24 0832   05/23/24 1000  azithromycin  (ZITHROMAX ) 500 mg in sodium chloride  0.9 % 250 mL IVPB  Status:  Discontinued        500 mg 250 mL/hr over 60 Minutes Intravenous Every 24 hours 05/23/24 0533 05/23/24 0832   05/23/24 1000  azithromycin  (ZITHROMAX ) tablet 500 mg  Status:  Discontinued        500 mg Oral Daily 05/23/24 0833 05/23/24 0849   05/23/24 1000  doxycycline  (VIBRA -TABS) tablet 100 mg  Status:  Discontinued        100 mg Oral Every 12 hours 05/23/24 0833 05/26/24 1412   05/22/24 1200  doxycycline  (VIBRAMYCIN ) 100 mg in sodium chloride  0.9 % 250 mL IVPB  Status:  Discontinued        100 mg 125 mL/hr over 120 Minutes Intravenous Every 12  hours 05/22/24 1159 05/23/24 0533   05/22/24 1130  ceFEPIme  (MAXIPIME ) 2 g in sodium chloride  0.9 % 100 mL IVPB        2 g 200 mL/hr over 30 Minutes Intravenous  Once 05/22/24 1116 05/22/24 1330        MEDICATIONS: Scheduled Meds:  Chlorhexidine  Gluconate Cloth  6 each Topical Daily   feeding supplement  237 mL Oral BID BM   Gerhardt's butt cream   Topical BID   guaiFENesin   600 mg Oral BID   lidocaine   1 patch Transdermal Q24H   sertraline   100 mg Oral Daily   sodium chloride  flush  3 mL Intravenous Q12H   Continuous Infusions: PRN Meds:.acetaminophen  **OR** acetaminophen , albuterol , antiseptic oral rinse, artificial tears, bisacodyl , diphenhydrAMINE , glycopyrrolate  **OR** glycopyrrolate  **OR** glycopyrrolate , haloperidol  **OR** haloperidol  **OR** haloperidol  lactate, HYDROmorphone  (DILAUDID ) injection, loperamide , LORazepam , ondansetron  **OR** ondansetron  (ZOFRAN ) IV, phenol, polyethylene glycol, trimethobenzamide    I have personally reviewed following labs and imaging studies  LABORATORY DATA: CBC: Recent Labs  Lab 05/26/24 0407  WBC 16.8*  NEUTROABS 12.9*  HGB 9.3*  HCT 28.3*  MCV 93.7  PLT 424*    Basic Metabolic Panel:  Recent Labs  Lab 05/26/24 0407  NA 141  K 4.4  CL 114*  CO2 19*  GLUCOSE 126*  BUN 43*  CREATININE 1.80*  CALCIUM  8.4*  MG 2.1    GFR: Estimated Creatinine Clearance: 22.1 mL/min (A) (by C-G formula based on SCr of 1.8 mg/dL (H)).  Liver Function Tests: No results for input(s): AST, ALT, ALKPHOS, BILITOT, PROT, ALBUMIN in the last 168 hours. No results for input(s): LIPASE, AMYLASE in the last 168 hours. No results for input(s): AMMONIA in the last 168 hours.  Coagulation Profile: No results for input(s): INR, PROTIME in the last 168 hours.  Cardiac Enzymes: No results for input(s): CKTOTAL, CKMB, CKMBINDEX, TROPONINI in the last 168 hours.  BNP (last 3 results) No results for input(s): PROBNP in  the last 8760 hours.  Lipid Profile: No results for input(s): CHOL, HDL, LDLCALC, TRIG, CHOLHDL, LDLDIRECT in the last 72 hours.  Thyroid Function Tests: No results for input(s): TSH, T4TOTAL, FREET4, T3FREE, THYROIDAB in the last 72 hours.  Anemia Panel: No results for input(s): VITAMINB12, FOLATE, FERRITIN, TIBC, IRON, RETICCTPCT in the last 72 hours.  Urine analysis:    Component Value Date/Time   COLORURINE YELLOW 05/15/2024 2116   APPEARANCEUR HAZY (A) 05/15/2024 2116   LABSPEC 1.016 05/15/2024 2116   PHURINE 5.0 05/15/2024 2116   GLUCOSEU NEGATIVE 05/15/2024 2116   HGBUR NEGATIVE 05/15/2024 2116   BILIRUBINUR NEGATIVE 05/15/2024 2116   KETONESUR NEGATIVE 05/15/2024 2116   PROTEINUR 30 (A) 05/15/2024 2116   UROBILINOGEN 1.0 01/12/2014 1940   NITRITE NEGATIVE 05/15/2024 2116   LEUKOCYTESUR NEGATIVE 05/15/2024 2116    Sepsis Labs: Lactic Acid, Venous    Component Value Date/Time   LATICACIDVEN 1.0 05/22/2024 1304    MICROBIOLOGY: No results found for this or any previous visit (from the past 240 hours).  RADIOLOGY STUDIES/RESULTS: No results found.   LOS: 10 days   Donalda Applebaum, MD  [facility]    To contact the attending provider between 7A-7P or the covering provider during after hours 7P-7A, please log into the web site [email or web address] and access using universal [place] password for that web site. If you do not have the password, please call the hospital operator.  06/01/2024, 9:44 PM

## 2024-06-01 NOTE — TOC Progression Note (Addendum)
 Transition of Care [facility]) - Progression Note    Patient Details  Name: Cassidy Larson MRN: [id number] Date of Birth: [birth year]  [facility] of [facility]) CM/SW Contact  Inocente GORMAN Kindle, LCSW Phone Number: 06/01/2024, 11:24 AM  Clinical Narrative:    CSW spoke with patient's assigned [facility] APS worker, Hildreth Sharps [phone]) and discussed the situation. She will begin investigating contacts to see if anyone would be willing to sign patient in to Hospice.   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 Hildreth was unable to locate any family. She has petitioned the court for emergency guardianship. CSW provided palliative and MD note securely as approved by ICM Supervisor to expedite the process.    Expected Discharge Plan: Hospice Medical Facility Barriers to Discharge: Other (must enter comment), Family Issues, DSS Guardianship               Expected Discharge Plan and Services In-house Referral: Clinical Social Work, Hospice / Palliative Care   Post Acute Care Choice: Hospice Living arrangements for the past 2 months: Group Home                                       Social Drivers of Health (SDOH) Interventions SDOH Screenings   Food Insecurity: No Food Insecurity (05/22/2024)  Housing: Unknown (05/22/2024)  Transportation Needs: No Transportation Needs (05/22/2024)  Recent Concern: Transportation Needs - Unmet Transportation Needs (05/15/2024)  Utilities: Not At Risk (05/22/2024)  Financial Resource Strain: Not on File (11/14/2022)   Received from [facility]  Physical Activity: Not on File (11/14/2022)   Received from [facility]  Social Connections: Socially Isolated (05/22/2024)  Stress: Not on File (11/14/2022)   Received from [facility]  Tobacco Use: High Risk (05/15/2024)    Readmission Risk Interventions    05/16/2024    2:59 PM  Readmission Risk Prevention Plan  Transportation Screening Complete  PCP  or Specialist Appt within 5-7 Days Not Complete  Home Care Screening Complete  Medication Review (RN CM) Complete

## 2024-06-01 NOTE — Progress Notes (Signed)
 PROGRESS NOTE     Patient Demographics:    Cassidy Larson, is a 68 y.o. female, DOB - 1956-01-23, FMW:[id number]  Outpatient Primary MD for the patient is [name], FNP    LOS - 10  Admit date - 05/22/2024    Chief Complaint  Patient presents with   Chest Pain       Brief Narrative (HPI from H&P)     68 y.o. 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   She was  just recently been hospitalized from 11/29-12/4 with concern for sepsis thought possibly secondary to a left-sided bacterial community-acquired pneumonia.  Blood cultures were negative and patient was treated with IV cefepime  and transition to oral Augmentin  to complete a 5-day course at discharge. Records note urine Legionella cultures were positive from admission the last admission.   In the ER her workup was consistent with NSTEMI, CHF with acute hypoxic respiratory failure, possible worsening pneumonia and she was admitted to the hospital for further management, but overall she is very weak, frail, and significant musculoskeletal chest pain on presentation, dyspneic with oxygen requirement, palliative medicine were consulted and she was transition to full comfort measures.    Subjective:   No significant events overnight, she denies any complaints this morning   Assessment  & Plan :   Acute respiratory failure with hypoxia Sepsis due to community-acquired pneumonia, recent Legionella pneumonia - Patient was recently diagnosed with Legionella pneumonia, CT scan  shows worsening pulmonary edema and pneumonia. - Treated with appropriate antibiotic azithromycin  and Augmentin . - Encouraged use incentive spirometry and flutter valve. - He is with significant oxygen requirement 5 to 6 L this has improved, currently on room air.  NSTEMI Acute on chronic diastolic congestive heart failure exacerbation -With evidence of volume overload, suspected acute on chronic.  - CTA of the chest noted concern for pulmonary edema with signs of right heart failure.  Echo noted with EF 60% and stable wall  motion as reported. - NSTEMI, likely type II in the setting of respiratory failure, hypoxia with underlying CAD. - Finished heparin  drip x 48 hours. - Will evidence of volume overload, Lasix  as tolerated - Significant chest pain, but appears musculoskeletal, on as needed pain regimen  Hypotension Supportive care Limiting diuresis and optimizing cardiac meds   AKI on chronic kidney disease stage IIIb Anemia continues to trend up, 1.8 today, this is secondary to hypotension, diuresis   CAD s/p PCI Remote history of stenting of the mid LAD - Continue Brilinta , Lipitor    Anemia of chronic disease.   -Anemia panel suggestive of iron deficiency, placed on replacement, on PPI no signs of ongoing bleeding, poor candidate for EGD or colonoscopy at this time    Prolonged QT interval Chronic.  QTc 512 - Monitor electrolytes, minimize QT prolonging medications as much as practically possible, due to recent Legionella pneumonia and worsening pneumonia on CT despite treatment before azithromycin  x 7 days.   Systemic lupus Review of records note patient with a history of positive ANA back in 2019.  Previously had been on Plaquenil   Mixed hyperlipidemia - Continue Lipitor    Dysthymic disorder - Continue Zoloft  but held mirtazapine   Severe mitral valve regurgitation -Not a surgical candidate  Elevated liver enzymes Acute.  AST 47 and ALT 52.  Possibly secondary to  passive congestion from patient being fluid overload. - Continue to monitor   Tobacco abuse Patient just recently stopped smoking in the last week. - Continue to encourage cessation of tobacco use  Incidental finding on CT.  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 Consider follow-up imaging with repeat CT of the chest in 1 to 3 months to ensure stability and/or resolution.  PCP to monitor.   Goals of care discussion End-of-life care - Patient with very poor life quality, severely deconditioned, at this point she is currently dyspneic, uncomfortable with recurrent chest pain, though unlikely cardiac, she is requesting multiple doses of pain regimen due to discomfort, dyspnea, palliative care involved, medicine input greatly appreciated, no family could be located, but patient was clear about her decisions not to pursue any aggressive measures, at this point wants to focus on comfort, she is at end-of-life, she is significantly dyspneic, hypoxic, frail, with poor appetite and oral intake, failure to thrive.  Plan for discharge to facility with hospice, social worker filed APS report on 05/28/2024 she can be transferred back to her facility with [facility]       Condition - Extremely Guarded  Family Communication  : No family, legal guardian listed, social worker in contact with DSS has plan to arrange for residential hospice discharge  Code Status : DNR  Consults  : Cardiology, palliative care  PUD Prophylaxis : PPI.   Procedures  :     TTE -  1. Left ventricular ejection fraction, by estimation, is 60 to 65%. The left ventricle has normal function. The left ventricle has no regional wall motion abnormalities. Left ventricular diastolic parameters are indeterminate.  2. Right ventricular systolic function is normal. The right ventricular size is normal.  3. Shadowing artifact in LA from MAC. Restricted posterior leaflet motion. Consider f/u TEE to further  evaluate the degree of MR and MV morphology. The mitral valve is abnormal. Severe mitral valve regurgitation. No evidence of mitral stenosis. Severe mitral annular calcification.  4. Tricuspid valve regurgitation is mild to moderate.  5. The aortic valve is normal in structure. Aortic valve regurgitation is  not visualized. No aortic stenosis is present.  6. The inferior vena cava is normal in size with greater than 50% respiratory variability, suggesting right atrial pressure of 3 mmHg.  CT - 1. No evidence of acute aortic syndrome. 2. Progressive left upper lobe pneumonia. 3. Diffuse pulmonary edema and small left pleural effusion. Reflux of contrast material into the IVC and hepatic veins consistent with right heart failure. 4. No central pulmonary arterial occlusion. 5. Enlarging mediastinal lymph nodes are nonspecific in the setting of chf and pneumonia. 6. 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 Consider follow-up imaging with repeat CT of the chest in 1 to 3 months to ensure stability and/or resolution. 7. Aortic atherosclerotic calcifications and coronary artery calcifications. 8. Progressive increased size of bilateral inguinal and left external iliac lymph nodes compared with 05/17/2024. Nonspecific but favor reactive changes.      Disposition Plan  :    Status is: Inpatient  DVT Prophylaxis  : Comfort   Lab Results  Component Value Date   PLT 424 (H) 05/26/2024    Diet :  Diet Order             DIET DYS 3 Room service appropriate? Yes; Fluid consistency: Thin  Diet effective now                    Inpatient Medications  Scheduled Meds:  Chlorhexidine  Gluconate Cloth  6 each Topical Daily   feeding supplement  237 mL Oral BID BM   Gerhardt's butt cream   Topical BID   guaiFENesin   600 mg Oral BID   lidocaine   1 patch Transdermal Q24H   sertraline   100 mg Oral Daily   sodium chloride  flush  3 mL  Intravenous Q12H   Continuous Infusions:   PRN Meds:.acetaminophen  **OR** acetaminophen , albuterol , antiseptic oral rinse, artificial tears, bisacodyl , diphenhydrAMINE , glycopyrrolate  **OR** glycopyrrolate  **OR** glycopyrrolate , haloperidol  **OR** haloperidol  **OR** haloperidol  lactate, HYDROmorphone  (DILAUDID ) injection, loperamide , LORazepam , ondansetron  **OR** ondansetron  (ZOFRAN ) IV, phenol, polyethylene glycol, trimethobenzamide     Objective:   Vitals:   05/29/24 2235 05/30/24 0723 05/31/24 0830 05/31/24 2042  BP: (!) 84/61 98/64 (!) 88/56 (!) 102/57  Pulse:  88 90   Resp:  14 14 16   Temp:  97.6 F (36.4 C) 97.6 F (36.4 C) (!) 97.4 F (36.3 C)  TempSrc:  Oral Oral Oral  SpO2:      Weight:      Height:        Wt Readings from Last 3 Encounters:  05/26/24 45.7 kg  05/15/24 45.2 kg  10/20/22 43.5 kg    No intake or output data in the 24 hours ending 06/01/24 1439    Physical Exam  Awake, alert, altered and somnolent but overall more talkative today, appears comfortable  Good air entry, no wheezing  Abdomen soft  No edema        Data Review:    Recent Labs  Lab 05/26/24 0407  WBC 16.8*  HGB 9.3*  HCT 28.3*  PLT 424*  MCV 93.7  MCH 30.8  MCHC 32.9  RDW 14.7  LYMPHSABS 1.5  MONOABS 1.8*  EOSABS 0.2  BASOSABS 0.1    Recent Labs  Lab 05/26/24 0407  NA 141  K 4.4  CL 114*  CO2 19*  ANIONGAP 8  GLUCOSE 126*  BUN 43*  CREATININE 1.80*  CRP 8.8*  PROCALCITON 0.12  BNP 1,815.1*  MG 2.1  CALCIUM  8.4*  Recent Labs  Lab 05/26/24 0407  CRP 8.8*  PROCALCITON 0.12  BNP 1,815.1*  MG 2.1  CALCIUM  8.4*    --------------------------------------------------------------------------------------------------------------- Lab Results  Component Value Date   CHOL 95 05/22/2024   HDL 18 (L) 05/22/2024   LDLCALC 43 05/22/2024   TRIG 173 (H) 05/22/2024   CHOLHDL 5.3 05/22/2024    Lab Results  Component Value Date   HGBA1C 5.8 (H)  05/22/2024   No results for input(s): TSH, T4TOTAL, FREET4, T3FREE, THYROIDAB in the last 72 hours. No results for input(s): VITAMINB12, FOLATE, FERRITIN, TIBC, IRON, RETICCTPCT in the last 72 hours.  ------------------------------------------------------------------------------------------------------------------ Cardiac Enzymes No results for input(s): CKMB, TROPONINI, MYOGLOBIN in the last 168 hours.  Invalid input(s): CK  Micro Results No results found for this or any previous visit (from the past 240 hours).   Radiology Report No results found.    Signature  -   Brayton Lye M.D on 06/01/2024 at 2:39 PM   -  To page go to [email or web address]

## 2024-06-01 NOTE — Progress Notes (Signed)
 [facility] night cross cover note:   I was notified by the patient's RN that this patient, who is on comfort care, is complaining of some pruritus.  She has a history of latex allergy, and currently has a latex based Foley catheter.  She has some residual pruritus following dose of IV Benadryl  per existing prn order.  No report of any rash or airway compromise. Will exchange Foley catheter to a nonlatex based Foley catheter.  Order placed for such.  Additionally, I ordered an additional dose of IV Benadryl  for residual pruritus.     Eva Pore, DO Hospitalist

## 2024-06-01 NOTE — Progress Notes (Signed)
 Daily Progress Note   Date: 06/01/2024   Patient Name: Cassidy Larson  DOB: 02-03-1956  MRN: [id number]  Age / Sex: 68 y.o., female  Attending Physician: Sherlon Brayton RAMAN, MD Primary Care Physician: [name], FNP Admit Date: 05/22/2024 Length of Stay: 10 days  Reason for Follow-up: Establishing goals of care, Non pain symptom management, Pain control, and Terminal Care  Past Medical History:  Diagnosis Date   Anxiety    Blood transfusion without reported diagnosis    CAD in native artery residual post LAD stent, 100% RCA and 80% LCx.  03/30/2022   CHF (congestive heart failure) (HCC)    CKD (chronic kidney disease) stage 4, GFR 15-29 ml/min (HCC)    Cocaine abuse (HCC)    Essential hypertension    HLD (hyperlipidemia) 03/30/2022   Lupus    On mechanically assisted ventilation (HCC) extubated 03/28/22  03/28/2022   Polysubstance abuse (HCC)    Positive ANA (antinuclear antibody)    S/P angioplasty with stent to mLAD 03/27/22 03/30/2022    Assessment & Plan:   HPI/Patient Profile:   68 year old female with medical history significant of ASCVD and myocardial infarction status post PCI October 2023, rheumatoid arthritis, hypertension, hyperlipidemia, CKD stage IIIb, SLP.  She was admitted with chest pain and difficulty breathing following a recent hospitalization for pneumonia.   Palliative team has been asked to support additional goals of care conversation in the setting of sepsis.   SUMMARY OF RECOMMENDATIONS DNR-comfort DSS caseworker Beecher Claudene [phone]) attempting to petition court for emergency guardianship of patient to sign paperwork for [facility] Symptom management as below  Symptom Management:  Acetaminophen  as needed for mild pain Hydromorphone  as needed for pain/air hunger/comfort, to adjust dose to optimize comfort as needed Robinul  as needed for excessive secretions Ativan  as needed for agitation/anxiety Zofran  as needed for  nausea Liquifilm Tears as needed for dry eyes Haldol  as needed for agitation/anxiety Dulcolax as needed for constipation Benadryl  as needed for itching Phenol spray as needed for sore throat May have comfort feeding Oxygen as needed for comfort, no escalation, consider weaning off Maintain Foley catheter for end-of-life care  Code Status: DNR - Comfort  Prognosis: < 2 weeks  Discharge Planning: Hospice facility  Discussed with: Elgergawy MD about continued efforts for DSS to work on signing paperwork for hospice.   Subjective:   Subjective: Chart Reviewed. Updates received. Patient Assessed. Created space and opportunity for patient  and family to explore thoughts and feelings regarding current medical situation.  Today's Discussion:  Met with patient at bedside without any visitors. Discussed with primary RN and he shared that patient overall doing well with the exception of pruritus due to latex allergy from foley catheter. IV benedryl and dilaudid  administered to help alleviate pruritus with some success. Patient resting comfortably during visit. Continues to have liquid PO intake but no significant PO intake to reverse overall FTT and poor prognosis.   Review of Systems  Unable to perform ROS   Objective:   Primary Diagnoses: Present on Admission:  Sepsis due to pneumonia (HCC)  Chest pain  Elevated troponin  Transient hypotension  Acute on chronic systolic CHF (congestive heart failure) (HCC)  Acute respiratory failure with hypoxia (HCC)  Normocytic anemia  CKD (chronic kidney disease), stage III (HCC)  CAD in native artery residual post LAD stent, 100% RCA and 80% LCx.   HLD (hyperlipidemia)  Tobacco abuse  Prolonged QT interval  Dysthymic disorder  Lupus (systemic lupus erythematosus) (HCC)  Vital Signs:  BP (!) 102/57 (BP Location: Left Arm)   Pulse 90   Temp (!) 97.4 F (36.3 C) (Oral)   Resp 16   Ht 5' (1.524 m)   Wt 45.7 kg   SpO2 100%   BMI  19.68 kg/m   Physical Exam  Palliative Assessment/Data: 20%   Existing Vynca/ACP Documentation: None  Thank you for allowing us  to participate in the care of Cassidy Larson PMT will continue to support holistically.  I personally spent a total of 35 minutes in the care of the patient today including preparing to see the patient, getting/reviewing separately obtained history, performing a medically appropriate exam/evaluation, referring and communicating with other health care professionals, and documenting clinical information in the EHR.  [name]  Palliative Medicine Team  Team Phone # [phone] (Nights/Weekends) 06/01/2024 4:24 PM

## 2024-06-01 NOTE — Plan of Care (Signed)
   Problem: Nutrition: Goal: Adequate nutrition will be maintained Outcome: Progressing   Problem: Elimination: Goal: Will not experience complications related to bowel motility Outcome: Progressing Goal: Will not experience complications related to urinary retention Outcome: Progressing

## 2024-06-02 DIAGNOSIS — Z515 Encounter for palliative care: Secondary | ICD-10-CM | POA: Diagnosis not present

## 2024-06-02 DIAGNOSIS — Z66 Do not resuscitate: Secondary | ICD-10-CM | POA: Diagnosis not present

## 2024-06-02 NOTE — Plan of Care (Signed)
   Problem: Education: Goal: Knowledge of General Education information will improve Description Including pain rating scale, medication(s)/side effects and non-pharmacologic comfort measures Outcome: Progressing

## 2024-06-02 NOTE — Progress Notes (Signed)
 Daily Progress Note   Date: 06/02/2024   Patient Name: Cassidy Larson  DOB: [birth year]  MRN: [id number]  Age / Sex: 68 y.o., female  Attending Physician: Raenelle Donalda HERO, MD Primary Care Physician: [name], FNP Admit Date: 05/22/2024 Length of Stay: 11 days  Reason for Follow-up: Establishing goals of care, Non pain symptom management, Pain control, and Terminal Care  Past Medical History:  Diagnosis Date   Anxiety    Blood transfusion without reported diagnosis    CAD in native artery residual post LAD stent, 100% RCA and 80% LCx.  03/30/2022   CHF (congestive heart failure) (HCC)    CKD (chronic kidney disease) stage 4, GFR 15-29 ml/min (HCC)    Cocaine abuse (HCC)    Essential hypertension    HLD (hyperlipidemia) 03/30/2022   Lupus    On mechanically assisted ventilation (HCC) extubated 03/28/22  03/28/2022   Polysubstance abuse (HCC)    Positive ANA (antinuclear antibody)    S/P angioplasty with stent to mLAD 03/27/22 03/30/2022    Assessment & Plan:   HPI/Patient Profile:   68 year old female with medical history significant of ASCVD and myocardial infarction status post PCI October 2023, rheumatoid arthritis, hypertension, hyperlipidemia, CKD stage IIIb, SLP.  She was admitted with chest pain and difficulty breathing following a recent hospitalization for pneumonia.   Palliative team has been asked to support additional goals of care conversation in the setting of sepsis.   SUMMARY OF RECOMMENDATIONS DNR-comfort DSS caseworker Beecher Claudene [phone]) attempting to petition court for emergency guardianship of patient to sign paperwork for [facility] Symptom management as below  Symptom Management:  Acetaminophen  as needed for mild pain Hydromorphone  as needed for pain/air hunger/comfort, to adjust dose to optimize comfort as needed Robinul  as needed for excessive secretions Ativan  as needed for agitation/anxiety Zofran  as needed for  nausea Liquifilm Tears as needed for dry eyes Haldol  as needed for agitation/anxiety Dulcolax as needed for constipation Benadryl  as needed for itching Phenol spray as needed for sore throat May have comfort feeding Oxygen as needed for comfort, no escalation, consider weaning off Maintain Foley catheter for end-of-life care  Code Status: DNR - Comfort  Prognosis: days to weeks  Discharge Planning: Hospice facility  Discussed with: Ghimire MD about patient's disposition and continues to look comfortable on current symptom regimen.   Subjective:   Subjective: Chart Reviewed. Updates received. Patient Assessed. Created space and opportunity for patient  and family to explore thoughts and feelings regarding current medical situation.  Today's Discussion: Met with patient bedside without visitors. Hard to arouse but looks comfortable resting. No PRN medications administered for comfort besides IV benadryl  from prior shift due to latex allergy from foley catheter. Liquid PO intake remains stable, but overall inadequate intake to meet caloric needs to sustain the patient.   Review of Systems  Unable to perform ROS   Objective:   Primary Diagnoses: Present on Admission:  Sepsis due to pneumonia (HCC)  Chest pain  Elevated troponin  Transient hypotension  Acute on chronic systolic CHF (congestive heart failure) (HCC)  Acute respiratory failure with hypoxia (HCC)  Normocytic anemia  CKD (chronic kidney disease), stage III (HCC)  CAD in native artery residual post LAD stent, 100% RCA and 80% LCx.   HLD (hyperlipidemia)  Tobacco abuse  Prolonged QT interval  Dysthymic disorder  Lupus (systemic lupus erythematosus) (HCC)   Vital Signs:  BP (!) 91/56 (BP Location: Left Arm)   Pulse 76   Temp (!) 97.5  F (36.4 C) (Axillary)   Resp 16   Ht 5' (1.524 m)   Wt 45.7 kg   SpO2 92%   BMI 19.68 kg/m   Physical Exam Constitutional:      Appearance: She is ill-appearing.      Comments: Cachectic  HENT:     Head: Normocephalic.  Cardiovascular:     Rate and Rhythm: Normal rate.  Pulmonary:     Effort: Pulmonary effort is normal.  Abdominal:     Palpations: Abdomen is soft.  Skin:    General: Skin is dry.    Palliative Assessment/Data: 20%   Existing Vynca/ACP Documentation: None  Thank you for allowing us  to participate in the care of [name] PMT will continue to support holistically.  I personally spent a total of 25 minutes in the care of the patient today including preparing to see the patient, performing a medically appropriate exam/evaluation, referring and communicating with other health care professionals, and documenting clinical information in the EHR.   [name]  Palliative Medicine Team  Team Phone # [phone] (Nights/Weekends) 06/02/2024 5:11 PM

## 2024-06-02 NOTE — Plan of Care (Signed)
°  Problem: Pain Managment: Goal: General experience of comfort will improve and/or be controlled Outcome: Progressing   Problem: Safety: Goal: Ability to remain free from injury will improve Outcome: Progressing   Problem: Respiratory: Goal: Verbalizations of increased ease of respirations will increase Outcome: Progressing   Problem: Pain Management: Goal: Satisfaction with pain management regimen will improve Outcome: Progressing

## 2024-06-02 NOTE — TOC Progression Note (Addendum)
 Transition of Care [facility]) - Progression Note    Patient Details  Name: Cassidy Larson MRN: [id number] Date of Birth: 1955-10-03  Transition of [facility]) CM/SW Contact  Inocente GORMAN Kindle, LCSW Phone Number: 06/02/2024, 3:19 PM  Clinical Narrative:    Hildreth with APS was able to secure a court appointment for emergency Guardianship tomorrow and stated she would likely be able to sign the patient in to Hospice after that if [facility] can still accept patient then.  CSW requested follow up from [facility]) on bed availability.   CSW spoke with Lucie at [facility] [phone]) who confirmed they do have a bed for patient tomorrow if Hildreth is able to sign her in.  Expected Discharge Plan: Hospice Medical Facility Barriers to Discharge: Other (must enter comment), Family Issues, DSS Guardianship               Expected Discharge Plan and Services In-house Referral: Clinical Social Work, Hospice / Palliative Care   Post Acute Care Choice: Hospice Living arrangements for the past 2 months: Group Home                                       Social Drivers of Health (SDOH) Interventions SDOH Screenings   Food Insecurity: No Food Insecurity (05/22/2024)  Housing: Unknown (05/22/2024)  Transportation Needs: No Transportation Needs (05/22/2024)  Recent Concern: Transportation Needs - Unmet Transportation Needs (05/15/2024)  Utilities: Not At Risk (05/22/2024)  Financial Resource Strain: Not on File (11/14/2022)   [facility] from [facility]  Physical Activity: Not on File (11/14/2022)   Received from [facility]  Social Connections: Socially Isolated (05/22/2024)  Stress: Not on File (11/14/2022)   Received from [facility]  Tobacco Use: High Risk (05/15/2024)    Readmission Risk Interventions    05/16/2024    2:59 PM  Readmission Risk Prevention Plan  Transportation Screening Complete  PCP or Specialist Appt within 5-7 Days Not Complete  Home Care Screening  Complete  Medication Review (RN CM) Complete

## 2024-06-03 MED ORDER — ONDANSETRON 4 MG PO TBDP: 4.0000 mg | ORAL_TABLET | Freq: Four times a day (QID) | ORAL | Status: AC | PRN

## 2024-06-03 MED ORDER — GLYCOPYRROLATE 1 MG PO TABS: 1.0000 mg | ORAL_TABLET | ORAL | Status: AC | PRN

## 2024-06-03 MED ORDER — HYDROMORPHONE HCL 1 MG/ML PO LIQD: 1.0000 mg | ORAL | Status: AC | PRN

## 2024-06-03 MED ORDER — HALOPERIDOL LACTATE 2 MG/ML PO CONC: 0.5000 mg | ORAL | Status: AC | PRN

## 2024-06-03 MED ORDER — LIDOCAINE 5 % EX PTCH: 1.0000 | MEDICATED_PATCH | CUTANEOUS | Status: AC

## 2024-06-03 NOTE — Progress Notes (Signed)
 PROGRESS NOTE        PATIENT DETAILS Name: Cassidy Larson Age: 68 y.o. Sex: female Date of Birth: Jul 17, 1955 Admit Date: 05/22/2024 Admitting Physician Maximino DELENA Sharps, MD ERE:FrWzpo, Lovena Mason, FNP  Brief Summary: Patient is a 68 y.o.  female with history of CAD s/p PCI October 2023, HTN, HLD, CKD stage IIIb, SLE, RA presented with chest pain/shortness of breath-felt to be secondary to progressive PNA/HFpEF exacerbation-hospital course complicated by persistent severe chest pain-failure to thrive syndrome-debility-after extensive discussion with patient-transitioned to full comfort measures.  Significant events: 11/29-12/4>> hospitalization for sepsis secondary to PNA 12/6>> admit to [facility]-chest pain 12/10>> transitioned to comfort care  Significant studies: 12/6>> CT angio chest/abdomen/pelvis: No evidence of acute aortic syndrome, no central PE-progressive left upper lobe pneumonia-diffuse pulmonary edema 12/6>> echo: EF 60-65%, severe mitral valve regurgitation, mild to moderate tricuspid valve vegetation 12/8>> CT chest: Interval progression of dense consolidation left upper lobe-with more confluent airspace opacity posterior left upper lobe.  Interval progression of bilateral lower lobe consolidation.  Significant microbiology data: 12/6>> blood culture: No growth 12/6>> urine Legionella antigen: Positive  Procedures: None  Consults: Cardiology Palliative care  Subjective: Constant nausea-no oral intake for the past several days-vomited yesterday.  Continues to have abdominal pain-received Zofran /Dilaudid  earlier this morning.  Objective: Vitals: Blood pressure (!) 84/62, pulse 89, temperature (!) 97.2 F (36.2 C), temperature source Oral, resp. rate 16, height 5' (1.524 m), weight 45.7 kg, SpO2 92%.   Exam: Chronically frail Abdomen soft nontender   Pertinent Labs/Radiology:    Latest Ref Rng & Units 05/26/2024    4:07 AM 05/25/2024     4:07 AM 05/24/2024    7:39 AM  CBC  WBC 4.0 - 10.5 K/uL 16.8  15.6  17.4   Hemoglobin 12.0 - 15.0 g/dL 9.3  8.4  8.4   Hematocrit 36.0 - 46.0 % 28.3  25.2  25.6   Platelets 150 - 400 K/uL 424  378  367     Lab Results  Component Value Date   NA 141 05/26/2024   K 4.4 05/26/2024   CL 114 (H) 05/26/2024   CO2 19 (L) 05/26/2024      Assessment/Plan: Acute hypoxic respiratory failure Secondary to PNA/HFpEF exacerbation FiO2 requirements have improved somewhat with supportive care Has been transitioned to full comfort measures-see below  Sepsis secondary to Legionella pneumonia Unfortunately-worsened in spite of receiving appropriate antimicrobial therapy Has been transitioned to full comfort measures  HFpEF exacerbation Treated with Lasix -volume status improved Due to continued deterioration-has been transitioned to full comfort measures-longer on diuretics  Non-STEMI History of CAD-requiring PCI October 2023 Initially medically managed-completed IV heparin  x 48 hours Continues to have intermittent chest pain-which is likely musculoskeletal Due to continued deterioration-now transitioned to full comfort measures  Prolonged QTc Supportive care  HLD No longer on statin-comfort care  Multifactorial anemia Secondary to chronic disease/iron deficiency To be a good candidate for endoscopy evaluation Has been transitioned to full comfort measures  History of SLE No longer on DMARD-comfort care  History of tobacco abuse  Mood disorder Remeron -comfort care  2.3 cm left adrenal nodule Incidental finding on CT imaging Radiology-unchanged from 2011-likely a benign adenoma  Severe failure to thrive syndrome/generalized weakness Continues to have very poor oral intake-per nursing staff-did not eat anything at all yesterday and today.  Continues to have nausea-vomited yesterday-now having abdominal  pain.  Continue antiemetics/as needed narcotics.  Palliative  care Severely deconditioned-poor quality of life-was dyspneic-uncomfortable with recurrent chest pain-requiring multiple doses of narcotics-no family located-but per prior notes-patient was clear about her decision not to pursue any aggressive measures-evaluated by palliative care-comfort measures in place-arrangements ongoing with APS/our [facility] team to see if we can get her to.  Underweight: Estimated body mass index is 19.68 kg/m as calculated from the following:   Height as of this encounter: 5' (1.524 m).   Weight as of this encounter: 45.7 kg.   Code status:   Code Status: Do not attempt resuscitation (DNR) - Comfort care   DVT Prophylaxis: Not needed-comfort care  Family Communication: None at bedside   Disposition Plan: Status is: Inpatient Remains inpatient appropriate because: Severity of illness   Planned Discharge Destination: Hospice care   Diet: Diet Order             DIET DYS 3 Room service appropriate? Yes; Fluid consistency: Thin  Diet effective now                     Antimicrobial agents: Anti-infectives (From admission, onward)    Start     Dose/Rate Route Frequency Ordered Stop   05/24/24 1000  azithromycin  (ZITHROMAX ) tablet 500 mg  Status:  Discontinued        500 mg Oral Daily 05/23/24 0849 05/26/24 1412   05/23/24 1300  ceFEPIme  (MAXIPIME ) 2 g in sodium chloride  0.9 % 100 mL IVPB  Status:  Discontinued        2 g 200 mL/hr over 30 Minutes Intravenous Every 24 hours 05/22/24 1309 05/23/24 0832   05/23/24 1000  azithromycin  (ZITHROMAX ) 500 mg in sodium chloride  0.9 % 250 mL IVPB  Status:  Discontinued        500 mg 250 mL/hr over 60 Minutes Intravenous Every 24 hours 05/23/24 0533 05/23/24 0832   05/23/24 1000  azithromycin  (ZITHROMAX ) tablet 500 mg  Status:  Discontinued        500 mg Oral Daily 05/23/24 0833 05/23/24 0849   05/23/24 1000  doxycycline  (VIBRA -TABS) tablet 100 mg  Status:  Discontinued        100 mg Oral Every 12 hours 05/23/24  0833 05/26/24 1412   05/22/24 1200  doxycycline  (VIBRAMYCIN ) 100 mg in sodium chloride  0.9 % 250 mL IVPB  Status:  Discontinued        100 mg 125 mL/hr over 120 Minutes Intravenous Every 12 hours 05/22/24 1159 05/23/24 0533   05/22/24 1130  ceFEPIme  (MAXIPIME ) 2 g in sodium chloride  0.9 % 100 mL IVPB        2 g 200 mL/hr over 30 Minutes Intravenous  Once 05/22/24 1116 05/22/24 1330        MEDICATIONS: Scheduled Meds:  Chlorhexidine  Gluconate Cloth  6 each Topical Daily   feeding supplement  237 mL Oral BID BM   Gerhardt's butt cream   Topical BID   guaiFENesin   600 mg Oral BID   lidocaine   1 patch Transdermal Q24H   sertraline   100 mg Oral Daily   sodium chloride  flush  3 mL Intravenous Q12H   Continuous Infusions: PRN Meds:.acetaminophen  **OR** acetaminophen , albuterol , antiseptic oral rinse, artificial tears, bisacodyl , diphenhydrAMINE , glycopyrrolate  **OR** glycopyrrolate  **OR** glycopyrrolate , haloperidol  **OR** haloperidol  **OR** haloperidol  lactate, HYDROmorphone  (DILAUDID ) injection, loperamide , LORazepam , ondansetron  **OR** ondansetron  (ZOFRAN ) IV, phenol, polyethylene glycol, trimethobenzamide    I have personally reviewed following labs and imaging studies  LABORATORY DATA: CBC: No results for  input(s): WBC, NEUTROABS, HGB, HCT, MCV, PLT in the last 168 hours.   Basic Metabolic Panel: No results for input(s): NA, K, CL, CO2, GLUCOSE, BUN, CREATININE, CALCIUM , MG, PHOS in the last 168 hours.   GFR: Estimated Creatinine Clearance: 22.1 mL/min (A) (by C-G formula based on SCr of 1.8 mg/dL (H)).  Liver Function Tests: No results for input(s): AST, ALT, ALKPHOS, BILITOT, PROT, ALBUMIN in the last 168 hours. No results for input(s): LIPASE, AMYLASE in the last 168 hours. No results for input(s): AMMONIA in the last 168 hours.  Coagulation Profile: No results for input(s): INR, PROTIME in the last 168  hours.  Cardiac Enzymes: No results for input(s): CKTOTAL, CKMB, CKMBINDEX, TROPONINI in the last 168 hours.  BNP (last 3 results) No results for input(s): PROBNP in the last 8760 hours.  Lipid Profile: No results for input(s): CHOL, HDL, LDLCALC, TRIG, CHOLHDL, LDLDIRECT in the last 72 hours.  Thyroid Function Tests: No results for input(s): TSH, T4TOTAL, FREET4, T3FREE, THYROIDAB in the last 72 hours.  Anemia Panel: No results for input(s): VITAMINB12, FOLATE, FERRITIN, TIBC, IRON, RETICCTPCT in the last 72 hours.  Urine analysis:    Component Value Date/Time   COLORURINE YELLOW 05/15/2024 2116   APPEARANCEUR HAZY (A) 05/15/2024 2116   LABSPEC 1.016 05/15/2024 2116   PHURINE 5.0 05/15/2024 2116   GLUCOSEU NEGATIVE 05/15/2024 2116   HGBUR NEGATIVE 05/15/2024 2116   BILIRUBINUR NEGATIVE 05/15/2024 2116   KETONESUR NEGATIVE 05/15/2024 2116   PROTEINUR 30 (A) 05/15/2024 2116   UROBILINOGEN 1.0 01/12/2014 1940   NITRITE NEGATIVE 05/15/2024 2116   LEUKOCYTESUR NEGATIVE 05/15/2024 2116    Sepsis Labs: Lactic Acid, Venous    Component Value Date/Time   LATICACIDVEN 1.0 05/22/2024 1304    MICROBIOLOGY: No results found for this or any previous visit (from the past 240 hours).  RADIOLOGY STUDIES/RESULTS: No results found.   LOS: 12 days   Donalda Applebaum, MD  [facility]    To contact the attending provider between 7A-7P or the covering provider during after hours 7P-7A, please log into the web site [email or web address] and access using universal [place] password for that web site. If you do not have the password, please call the hospital operator.  06/03/2024, 11:44 AM

## 2024-06-03 NOTE — TOC Progression Note (Addendum)
 Transition of Care [facility]) - Progression Note    Patient Details  Name: Cassidy Larson MRN: [id number] Date of Birth: [birth year]  [facility]) CM/SW Contact  Inocente GORMAN Kindle, LCSW Phone Number: 06/03/2024, 12:15 PM  Clinical Narrative:    12:15 PM-Camilla Claudene with [facility] was able to successfully obtain emergency Guardianship of patient at court today. CSW notified [facility] who is emailing consents to [place]. Their MD will come assess patient to ensure she still qualifies and if so can admit today.   1:12 PM-CSW received call from [facility] with [facility] stating the patient has been accepted and can come today. CSW provided RN with report number and made MD and Legal Guardian Camilla aware.    Expected Discharge Plan: Hospice Medical Facility Barriers to Discharge: Other (must enter comment), Family Issues, DSS Guardianship               Expected Discharge Plan and Services In-house Referral: Clinical Social Work, Hospice / Palliative Care   Post Acute Care Choice: Hospice Living arrangements for the past 2 months: Group Home                                       Social Drivers of Health (SDOH) Interventions SDOH Screenings   Food Insecurity: No Food Insecurity (05/22/2024)  Housing: Unknown (05/22/2024)  Transportation Needs: No Transportation Needs (05/22/2024)  Recent Concern: Transportation Needs - Unmet Transportation Needs (05/15/2024)  Utilities: Not At Risk (05/22/2024)  Financial Resource Strain: Not on File (11/14/2022)   Received from [facility]  Physical Activity: Not on File (11/14/2022)   Received from [facility]  Social Connections: Socially Isolated (05/22/2024)  Stress: Not on File (11/14/2022)   Received from [facility]  Tobacco Use: High Risk (05/15/2024)    Readmission Risk Interventions    05/16/2024    2:59 PM  Readmission Risk Prevention Plan  Transportation Screening Complete  PCP or Specialist Appt within 5-7 Days Not  Complete  Home Care Screening Complete  Medication Review (RN CM) Complete

## 2024-06-03 NOTE — Progress Notes (Signed)
 Daily Progress Note   Date: 06/03/2024   Patient Name: Cassidy Larson  DOB: [birth year]  MRN: [id number]  Age / Sex: 68 y.o., female  Attending Physician: Raenelle Donalda HERO, MD Primary Care Physician: [name], FNP Admit Date: 05/22/2024 Length of Stay: 12 days  Reason for Follow-up: Establishing goals of care  Past Medical History:  Diagnosis Date   Anxiety    Blood transfusion without reported diagnosis    CAD in native artery residual post LAD stent, 100% RCA and 80% LCx.  03/30/2022   CHF (congestive heart failure) (HCC)    CKD (chronic kidney disease) stage 4, GFR 15-29 ml/min (HCC)    Cocaine abuse (HCC)    Essential hypertension    HLD (hyperlipidemia) 03/30/2022   Lupus    On mechanically assisted ventilation (HCC) extubated 03/28/22  03/28/2022   Polysubstance abuse (HCC)    Positive ANA (antinuclear antibody)    S/P angioplasty with stent to mLAD 03/27/22 03/30/2022    Assessment & Plan:   HPI/Patient Profile:   68 year old female with medical history significant of ASCVD and myocardial infarction status post PCI October 2023, rheumatoid arthritis, hypertension, hyperlipidemia, CKD stage IIIb, SLP.  She was admitted with chest pain and difficulty breathing following a recent hospitalization for pneumonia.   Palliative team has been asked to support additional goals of care conversation in the setting of sepsis.   SUMMARY OF RECOMMENDATIONS DNR-comfort DSS caseworker Beecher Claudene [phone]) completed consents for [facility] Symptom management as below  Symptom Management:   Acetaminophen  as needed for mild pain Hydromorphone  as needed for pain/air hunger/comfort, to adjust dose to optimize comfort as needed Robinul  as needed for excessive secretions Ativan  as needed for agitation/anxiety Zofran  as needed for nausea Liquifilm Tears as needed for dry eyes Haldol  as needed for agitation/anxiety Dulcolax as needed for  constipation Benadryl  as needed for itching Phenol spray as needed for sore throat May have comfort feeding Oxygen as needed for comfort, no escalation, consider weaning off Maintain Foley catheter for end-of-life care   Code Status: DNR - Comfort  Prognosis: days to weeks  Discharge Planning: Hospice facility  Discussed with: Ghimire MD 06/03/2024 about patient's continued general overall comfort and remains stable for discharge.   Subjective:   Subjective: Chart Reviewed. Updates received. Patient Assessed. Created space and opportunity for patient  and family to explore thoughts and feelings regarding current medical situation.  Continues to have some liquid PO intake. Received dilaudid  4 mg x1, 1 mg x2, and benadryl  12.5 mg IV.   Today's Discussion:  Met with patient today at bedside without any visitors. Patient remains on room air. Complains of continued nausea and inability to eat. Reports that she continues to feel pruritus from her prior latex foley catheter which she was allergic to. Notified RN for PRN benadryl  as well as antinausea medication to help patient eat.   Review of Systems  Constitutional:  Positive for activity change, appetite change and fatigue.  Genitourinary:        Pruritus from latex foley allergy.     Objective:   Primary Diagnoses: Present on Admission:  Sepsis due to pneumonia (HCC)  Chest pain  Elevated troponin  Transient hypotension  Acute on chronic systolic CHF (congestive heart failure) (HCC)  Acute respiratory failure with hypoxia (HCC)  Normocytic anemia  CKD (chronic kidney disease), stage III (HCC)  CAD in native artery residual post LAD stent, 100% RCA and 80% LCx.   HLD (hyperlipidemia)  Tobacco abuse  Prolonged QT interval  Dysthymic disorder  Lupus (systemic lupus erythematosus) (HCC)   Vital Signs:  BP (!) 84/62 (BP Location: Right Arm)   Pulse 89   Temp (!) 97.2 F (36.2 C) (Oral)   Resp 16   Ht 5' (1.524 m)    Wt 45.7 kg   SpO2 92%   BMI 19.68 kg/m   Physical Exam Constitutional:      Appearance: She is ill-appearing.     Comments: Cachectic  HENT:     Head: Normocephalic and atraumatic.  Eyes:     Extraocular Movements: Extraocular movements intact.  Pulmonary:     Effort: Pulmonary effort is normal.  Abdominal:     Palpations: Abdomen is soft.  Skin:    General: Skin is dry.  Neurological:     Mental Status: She is alert and oriented to person, place, and time.    Palliative Assessment/Data: 20%   Existing Vynca/ACP Documentation: None  Thank you for allowing us  to participate in the care of Cassidy Larson PMT will continue to support holistically.  I personally spent a total of 35 minutes in the care of the patient today including preparing to see the patient, getting/reviewing separately obtained history, performing a medically appropriate exam/evaluation, counseling and educating, referring and communicating with other health care professionals, and documenting clinical information in the EHR.  [name]  Palliative Medicine Team  Team Phone # [phone] (Nights/Weekends) 06/03/2024 11:29 AM

## 2024-06-03 NOTE — Progress Notes (Signed)
° °  Brief Progress Note   _____________________________________________________________________________________________________________  Patient Name: Coretta Leisey Patient DOB: April 16, 1956 Date: 06/03/2024    Data: Patient to be transported via [facility].    Action: Called [facility] for update, patient is 2nd on their list at this time.    Response:  Sent secure chat to [employer] giving update of ETA for [facility].  _____________________________________________________________________________________________________________  The [facility] RN Expeditor Jeremy Mclamb Please contact us  directly via secure chat (search for [facility]) or by calling us  at [phone] [facility]).

## 2024-06-03 NOTE — Discharge Summary (Signed)
 PATIENT DETAILS Name: Cassidy Larson Age: 68 y.o. Sex: female Date of Birth: [birth year] MRN: [id number]. Admitting Physician: Maximino DELENA Sharps, MD ERE:FrWzpo, Cassidy Mason, FNP  Admit Date: 05/22/2024 Discharge date: 06/03/2024  Recommendations for Outpatient Follow-up:  Follow up with PCP in 1-2 weeks Please obtain CMP/CBC in one week  Admitted From:  Home  Disposition: Hospice care   Discharge Condition: poor  CODE STATUS:   Code Status: Do not attempt resuscitation (DNR) - Comfort care   Diet recommendation:  Diet Order             DIET DYS 3 Room service appropriate? Yes; Fluid consistency: Thin  Diet effective now                    Brief Summary: Patient is a 68 y.o.  female with history of CAD s/p PCI October 2023, HTN, HLD, CKD stage IIIb, SLE, RA presented with chest pain/shortness of breath-felt to be secondary to progressive PNA/HFpEF exacerbation-hospital course complicated by persistent severe chest pain-failure to thrive syndrome-debility-after extensive discussion with patient-transitioned to full comfort measures.   Significant events: 11/29-12/4>> hospitalization for sepsis secondary to PNA 12/6>> admit to [facility]-chest pain 12/10>> transitioned to comfort care   Significant studies: 12/6>> CT angio chest/abdomen/pelvis: No evidence of acute aortic syndrome, no central PE-progressive left upper lobe pneumonia-diffuse pulmonary edema 12/6>> echo: EF 60-65%, severe mitral valve regurgitation, mild to moderate tricuspid valve vegetation 12/8>> CT chest: Interval progression of dense consolidation left upper lobe-with more confluent airspace opacity posterior left upper lobe.  Interval progression of bilateral lower lobe consolidation.   Significant microbiology data: 12/6>> blood culture: No growth 12/6>> urine Legionella antigen: Positive   Procedures: None   Consults: Cardiology Palliative care  Brief Hospital Course: Acute hypoxic  respiratory failure Secondary to PNA/HFpEF exacerbation FiO2 requirements have improved somewhat with supportive care Has been transitioned to full comfort measures-see below   Sepsis secondary to Legionella pneumonia Unfortunately-worsened in spite of receiving appropriate antimicrobial therapy Has been transitioned to full comfort measures   HFpEF exacerbation Treated with Lasix -volume status improved Due to continued deterioration-has been transitioned to full comfort measures-longer on diuretics   Non-STEMI History of CAD-requiring PCI October 2023 Initially medically managed-completed IV heparin  x 48 hours Continues to have intermittent chest pain-which is likely musculoskeletal Due to continued deterioration-now transitioned to full comfort measures   Prolonged QTc Supportive care   HLD No longer on statin-comfort care   Multifactorial anemia Secondary to chronic disease/iron deficiency To be a good candidate for endoscopy evaluation Has been transitioned to full comfort measures   History of SLE No longer on DMARD-comfort care   History of tobacco abuse   Mood disorder Remeron -comfort care   2.3 cm left adrenal nodule Incidental finding on CT imaging Radiology-unchanged from 2011-likely a benign adenoma   Severe failure to thrive syndrome/generalized weakness Very poor oral intake continues-continues to have intermittent nausea/vomiting-continue supportive care-comfort measures in place.     Palliative care Severely deconditioned-poor quality of life-was dyspneic-uncomfortable with recurrent chest pain-requiring multiple doses of narcotics-no family located-but per prior notes-patient was clear about her decision not to pursue any aggressive measures-evaluated by palliative care-comfort measures in place-arrangements ongoing with APS/our [facility] team to see if we can get her to residential hospice when bed available.  Unfortunate   Underweight: Estimated body mass  index is 19.68 kg/m as calculated from the following:   Height as of this encounter: 5' (1.524 m).   Weight as of this  encounter: 45.7 kg.   Discharge Diagnoses:  Principal Problem:   Sepsis due to pneumonia [name] Active Problems:   Acute respiratory failure with hypoxia (HCC)   Acute on chronic systolic CHF (congestive heart failure) (HCC)   Chest pain   Elevated troponin   Transient hypotension   Normocytic anemia   CKD (chronic kidney disease), stage III (HCC)   CAD in native artery residual post LAD stent, 100% RCA and 80% LCx.    Prolonged QT interval   Lupus (systemic lupus erythematosus) (HCC)   HLD (hyperlipidemia)   Dysthymic disorder   Tobacco abuse   Discharge Instructions:  Activity:  As tolerated with Full fall precautions use walker/cane & assistance as needed  Discharge Instructions     Increase activity slowly   Complete by: As directed    No wound care   Complete by: As directed       Allergies as of 06/03/2024       Reactions   Asa [aspirin ] Diarrhea, Nausea And Vomiting, Other (See Comments)   Severe stomach cramps Diarrhea (sometimes with blood)   Latex Itching   Other Rash   Cotton fabric - rash        Medication List     STOP taking these medications    adalimumab 40 MG/0.8ML Ajkt pen Commonly known as: HUMIRA   amLODipine 2.5 MG tablet Commonly known as: NORVASC   amoxicillin -clavulanate 875-125 MG tablet Commonly known as: AUGMENTIN    atorvastatin  40 MG tablet Commonly known as: LIPITOR    carvedilol  6.25 MG tablet Commonly known as: COREG    dextromethorphan -guaiFENesin  30-600 MG 12hr tablet Commonly known as: MUCINEX  DM   GoodSense Pain Relief Extra St 500 MG tablet Generic drug: acetaminophen    hydrALAZINE  25 MG tablet Commonly known as: APRESOLINE    hydrOXYzine 50 MG tablet Commonly known as: ATARAX   isosorbide  mononitrate 30 MG 24 hr tablet Commonly known as: IMDUR    mirtazapine  7.5 MG tablet Commonly  known as: REMERON    Oyster Shell Calcium  500 MG Tabs   sertraline  100 MG tablet Commonly known as: ZOLOFT    ticagrelor  90 MG Tabs tablet Commonly known as: BRILINTA        TAKE these medications    glycopyrrolate  1 MG tablet Commonly known as: ROBINUL  Take 1 tablet (1 mg total) by mouth every 4 (four) hours as needed (excessive secretions).   haloperidol  2 MG/ML solution Commonly known as: HALDOL  Place 0.3 mLs (0.6 mg total) under the tongue every 4 (four) hours as needed for agitation (or delirium).   HYDROmorphone  HCl 1 MG/ML Liqd Commonly known as: Dilaudid  Take 1 mL (1 mg total) by mouth every 4 (four) hours as needed for severe pain (pain score 7-10).   lidocaine  5 % Commonly known as: LIDODERM  Place 1 patch onto the skin daily. Remove & Discard patch within 12 hours or as directed by MD Start taking on: June 04, 2024   loperamide  2 MG capsule Commonly known as: IMODIUM  Take 1 capsule (2 mg total) by mouth as needed for diarrhea or loose stools.   LORazepam  2 MG/ML concentrated solution Commonly known as: LORazepam  Intensol Take 0.5 mLs (1 mg total) by mouth every 4 (four) hours as needed for anxiety, seizure, sedation or sleep.   ondansetron  4 MG disintegrating tablet Commonly known as: ZOFRAN -ODT Take 1 tablet (4 mg total) by mouth every 6 (six) hours as needed for nausea.        Follow-up Information     Lenora Cassidy Mason, FNP. Schedule  an appointment as soon as possible for a visit.   Specialty: Family Medicine Why: As needed Contact information: [address] [place] [address] [phone]                Allergies[1]   Other Procedures/Studies: CT CHEST WO CONTRAST Result Date: 05/24/2024 CLINICAL DATA:  Pneumonia. EXAM: CT CHEST WITHOUT CONTRAST TECHNIQUE: Multidetector CT imaging of the chest was performed following the standard protocol without IV contrast. RADIATION DOSE REDUCTION: This exam was performed according to  the departmental dose-optimization program which includes automated exposure control, adjustment of the mA and/or kV according to patient size and/or use of iterative reconstruction technique. COMPARISON:  05/22/2024 FINDINGS: Cardiovascular: The heart size is normal. No substantial pericardial effusion. Coronary artery calcification is evident. No substantial pericardial effusion. Mediastinum/Nodes: 12 mm short axis right paratracheal lymph node. 12 mm short axis subcarinal lymph node. Hilar regions not well evaluated on noncontrast imaging. The esophagus has normal imaging features. Multinodular thyroid enlargement is similar to prior. Small axillary nodes evident bilaterally. Lungs/Pleura: Interval progression of dense consolidative opacity in the left upper lobe with left upper lobe dependent interstitial thickening similar to prior. There is more confluent airspace disease in the posterior left upper lobe today. Interval progression of bilateral lower lobe collapse/consolidation. Small to moderate bilateral pleural effusions are progressive. Upper Abdomen: 2.3 cm left adrenal nodule cannot be definitively characterized. Musculoskeletal: No worrisome lytic or sclerotic osseous abnormality. IMPRESSION: 1. Interval progression of dense consolidative opacity in the left upper lobe with more confluent airspace disease in the posterior left upper lobe today. Imaging features compatible with progressive pneumonia. 2. Interval progression of bilateral lower lobe collapse/consolidation. 3. Small to moderate bilateral pleural effusions are progressive. 4. Mild mediastinal lymphadenopathy, likely reactive. Follow-up recommended to ensure resolution. 5. 2.3 cm left adrenal nodule cannot be definitively characterized. This is not substantially changed since a CT of 01/01/2010 compatible with benign etiology such as adenoma. 6. Multinodular thyroid enlargement is similar to prior. Electronically Signed   By: Camellia Candle  M.D.   On: 05/24/2024 12:50   DG Chest Port 1 View Result Date: 05/24/2024 CLINICAL DATA:  Shortness of breath. EXAM: PORTABLE CHEST 1 VIEW COMPARISON:  05/22/2024 FINDINGS: Focal masslike consolidative opacity in the left upper lung is similar to prior. The cardio pericardial silhouette is enlarged. New diffuse interstitial opacity suggests edema. Left pleural effusion evident. Telemetry leads overlie the chest. IMPRESSION: 1. New diffuse interstitial opacity suggests edema. 2. Persistent masslike consolidative opacity in the left upper lung. 3. Left pleural effusion. Electronically Signed   By: Camellia Candle M.D.   On: 05/24/2024 10:32   ECHOCARDIOGRAM COMPLETE Result Date: 05/23/2024    ECHOCARDIOGRAM REPORT   Patient Name:   Cassidy Larson Date of Exam: 05/23/2024 Medical Rec #:  [id number]        Height:       60.0 in Accession #:    [id number]       Weight:       109.8 lb Date of Birth:  [birth year]        BSA:          1.447 m Patient Age:    68 years         BP:           101/73 mmHg Patient Gender: F                HR:           81  bpm. Exam Location:  Inpatient Procedure: 2D Echo, Cardiac Doppler and Color Doppler (Both Spectral and Color            Flow Doppler were utilized during procedure). Indications:    CHF-Acute Systolic I50.21  History:        Patient has prior history of Echocardiogram examinations, most                 recent 04/22/2022. CHF, CAD; Risk Factors:Hypertension.  Sonographer:    Jayson Gaskins Referring Phys: [id number] RONDELL A SMITH IMPRESSIONS  1. Left ventricular ejection fraction, by estimation, is 60 to 65%. The left ventricle has normal function. The left ventricle has no regional wall motion abnormalities. Left ventricular diastolic parameters are indeterminate.  2. Right ventricular systolic function is normal. The right ventricular size is normal.  3. Shadowing artifact in LA from MAC. Restricted posterior leaflet motion. Consider f/u TEE to further evaluate the degree of  MR and MV morphology. The mitral valve is abnormal. Severe mitral valve regurgitation. No evidence of mitral stenosis. Severe mitral annular calcification.  4. Tricuspid valve regurgitation is mild to moderate.  5. The aortic valve is normal in structure. Aortic valve regurgitation is not visualized. No aortic stenosis is present.  6. The inferior vena cava is normal in size with greater than 50% respiratory variability, suggesting right atrial pressure of 3 mmHg. FINDINGS  Left Ventricle: Left ventricular ejection fraction, by estimation, is 60 to 65%. The left ventricle has normal function. The left ventricle has no regional wall motion abnormalities. Strain was performed and the global longitudinal strain is indeterminate. The left ventricular internal cavity size was normal in size. There is no left ventricular hypertrophy. Left ventricular diastolic parameters are indeterminate. Right Ventricle: The right ventricular size is normal. No increase in right ventricular wall thickness. Right ventricular systolic function is normal. Left Atrium: Left atrial size was normal in size. Right Atrium: Right atrial size was normal in size. Pericardium: There is no evidence of pericardial effusion. Mitral Valve: Shadowing artifact in LA from MAC. Restricted posterior leaflet motion. Consider f/u TEE to further evaluate the degree of MR and MV morphology. The mitral valve is abnormal. There is moderate thickening of the mitral valve leaflet(s). There is moderate calcification of the mitral valve leaflet(s). Severe mitral annular calcification. Severe mitral valve regurgitation. No evidence of mitral valve stenosis. Tricuspid Valve: The tricuspid valve is normal in structure. Tricuspid valve regurgitation is mild to moderate. No evidence of tricuspid stenosis. Aortic Valve: The aortic valve is normal in structure. Aortic valve regurgitation is not visualized. No aortic stenosis is present. Aortic valve mean gradient measures  2.0 mmHg. Aortic valve peak gradient measures 4.2 mmHg. Aortic valve area, by VTI measures 2.34 cm. Pulmonic Valve: The pulmonic valve was normal in structure. Pulmonic valve regurgitation is not visualized. No evidence of pulmonic stenosis. Aorta: The aortic root is normal in size and structure. Venous: The inferior vena cava is normal in size with greater than 50% respiratory variability, suggesting right atrial pressure of 3 mmHg. IAS/Shunts: No atrial level shunt detected by color flow Doppler. Additional Comments: 3D was performed not requiring image post processing on an independent workstation and was indeterminate.  LEFT VENTRICLE PLAX 2D LVIDd:         3.90 cm   Diastology LVIDs:         2.90 cm   LV e' medial:    8.92 cm/s LV PW:  0.90 cm   LV E/e' medial:  10.6 LV IVS:        0.90 cm   LV e' lateral:   8.05 cm/s LVOT diam:     1.70 cm   LV E/e' lateral: 11.8 LV SV:         37 LV SV Index:   25 LVOT Area:     2.27 cm  RIGHT VENTRICLE            IVC RV S prime:     8.70 cm/s  IVC diam: 1.80 cm TAPSE (M-mode): 2.3 cm LEFT ATRIUM             Index        RIGHT ATRIUM           Index LA Vol (A2C):   30.9 ml 21.36 ml/m  RA Area:     10.10 cm LA Vol (A4C):   25.6 ml 17.70 ml/m  RA Volume:   21.00 ml  14.52 ml/m LA Biplane Vol: 29.0 ml 20.05 ml/m  AORTIC VALVE AV Area (Vmax):    1.78 cm AV Area (Vmean):   1.59 cm AV Area (VTI):     2.34 cm AV Vmax:           102.00 cm/s AV Vmean:          75.400 cm/s AV VTI:            0.156 m AV Peak Grad:      4.2 mmHg AV Mean Grad:      2.0 mmHg LVOT Vmax:         79.80 cm/s LVOT Vmean:        52.700 cm/s LVOT VTI:          0.161 m LVOT/AV VTI ratio: 1.03  AORTA Ao Root diam: 2.30 cm MITRAL VALVE MV Area (PHT): 4.77 cm    SHUNTS MV Decel Time: 159 msec    Systemic VTI:  0.16 m MV E velocity: 94.90 cm/s  Systemic Diam: 1.70 cm MV A velocity: 48.20 cm/s MV E/A ratio:  1.97 Maude Emmer MD Electronically signed by Maude Emmer MD Signature Date/Time:  05/23/2024/12:26:43 PM    Final    CT Angio Chest/Abd/Pel for Dissection W and/or W/WO Result Date: 05/22/2024 EXAM: CTA CHEST, ABDOMEN AND PELVIS WITHOUT AND WITH CONTRAST 05/22/2024 09:35:07 AM TECHNIQUE: CTA of the chest was performed without and with the administration of intravenous contrast. CTA of the abdomen and pelvis was performed without and with the administration of intravenous contrast. Multiplanar reformatted images are provided for review. MIP images are provided for review. Automated exposure control, iterative reconstruction, and/or weight based adjustment of the mA/kV was utilized to reduce the radiation dose to as low as reasonably achievable. COMPARISON: CT chest 05/15/2024 and CT abdomen and pelvis 05/17/2024. CLINICAL HISTORY: Acute aortic syndrome (AAS) suspected. FINDINGS: VASCULATURE: AORTA: Aortic atherosclerosis. No acute finding. No abdominal aortic aneurysm. No dissection. PULMONARY ARTERIES: The central pulmonary arteries are patent. No pulmonary embolism with the limits of this exam. GREAT VESSELS OF AORTIC ARCH: No acute finding. No dissection. No arterial occlusion or significant stenosis. CELIAC TRUNK: No acute finding. No occlusion or significant stenosis. SUPERIOR MESENTERIC ARTERY: Atherosclerotic calcifications involving the superior mesenteric artery and its branches noted without significant stenosis. INFERIOR MESENTERIC ARTERY: No acute finding. No occlusion or significant stenosis. RENAL ARTERIES: The left renal artery is patent. Approximately 30 percent stenosis of the right renal artery due to calcified plaque at the origin.  ILIAC ARTERIES: Bilateral iliacs artery calcifications without significant stenosis. CHEST: MEDIASTINUM: Heart size is normal. No pericardial effusion. Coronary artery atherosclerotic calcifications. Enlarged thyroid gland is again noted with retrosternal extension. Similar to previous study. Increased soft tissue thickening extending along the left  prevascular space is noted and may reflect thymic hyperplasia this appears unchanged from 05/15/2024 but has progressed when compared with more remote study from 06/20/2023. LUNGS AND PLEURA: The central airways are patent. Small left pleural effusion, increased from previous exam. Emphysema. Diffuse interlobular septal thickening and ground-glass attenuation is noted throughout both lungs concerning for interstitial edema. Within the left upper lobe there is progressive ground-glass attenuation interstitial thickening and consolidative change concerning for pneumonia. Interval reexpansion of previous left lower lobe atelectasis. No pneumothorax. THORACIC BONES AND SOFT TISSUES: No acute bone or soft tissue abnormality. ABDOMEN AND PELVIS: LIVER: Reflux of contrast material into the IVC and hepatic veins suggests right heart dysfunction. GALLBLADDER AND BILE DUCTS: Gallbladder is unremarkable. No biliary ductal dilatation. SPLEEN: The spleen is within normal limits in size and appearance. PANCREAS: The pancreas is normal in size and contour without focal lesion or ductal dilatation. ADRENAL GLANDS: Unchanged appearance of left adrenal nodule measuring 2.3 cm, image 140/7. This is stable when compared with 07/03/2022 compatible with a likely benign process. KIDNEYS, URETERS AND BLADDER: No stones in the kidneys or ureters. No hydronephrosis. No perinephric or periureteral stranding. Urinary bladder is unremarkable. GI AND BOWEL: Stomach and duodenal sweep demonstrate no acute abnormality. No pathologic dilatation of the large or small bowel loops. No bowel inflammation. Colonic diverticula noted without signs of acute diverticulitis. There is no bowel obstruction. No abnormal bowel wall thickening or distension. REPRODUCTIVE: The uterus and adnexal structures are unremarkable. PERITONEUM AND RETROPERITONEUM: No significant free fluid or fluid collections are noted. No signs of pneumoperitoneum. LYMPH NODES: Enlarging  subcarinal carina lymph node now measures 1.5 cm versus 1.0 cm previously. Enlarging right paratracheal lymph node measures 1.6 cm versus 1.0 cm previously. Prominent bilateral inguinal lymph nodes are identified including 1 cm left inguinal node, image 262/2. The left external iliac lymph node has increased in size measuring 1.2 cm, image 33.86. previously 0.9 cm. ABDOMINAL BONES AND SOFT TISSUES: Degenerative changes are noted within both hips. Small fat-containing umbilical hernia. No acute soft tissue abnormality. IMPRESSION: 1. No evidence of acute aortic syndrome. 2. Progressive left upper lobe pneumonia. 3. Diffuse pulmonary edema and small left pleural effusion. Reflux of contrast material into the IVC and hepatic veins consistent with right heart failure. 4. No central pulmonary arterial occlusion. 5. Enlarging mediastinal lymph nodes are nonspecific in the setting of chf and pneumonia. 6. 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 Consider follow-up imaging with repeat CT of the chest in 1 to 3 months to ensure stability and/or resolution. 7. Aortic atherosclerotic calcifications and coronary artery calcifications. 8. Progressive increased size of bilateral inguinal and left external iliac lymph nodes compared with 05/17/2024. Nonspecific but favor reactive changes. Electronically signed by: Waddell Calk MD 05/22/2024 10:27 AM EST RP Workstation: [id number]   DG Chest Port 1 View Result Date: 05/22/2024 EXAM: 1 VIEW(S) XRAY OF THE CHEST 05/22/2024 07:39:00 AM COMPARISON: 05/15/2024 CLINICAL HISTORY: chest pain FINDINGS: LUNGS AND PLEURA: Interval increase in airspace opacity within left mid lung zone. No pleural effusion. No pneumothorax. HEART AND MEDIASTINUM: Atherosclerotic plaque noted. No acute abnormality of the cardiac and mediastinal silhouettes. BONES AND SOFT TISSUES: No acute osseous abnormality. IMPRESSION: 1.  Interval  increase in airspace opacity within the left mid lung zone. Electronically signed by: Evalene Coho MD 05/22/2024 07:58 AM EST RP Workstation: [id number]   CT ABDOMEN PELVIS W CONTRAST Result Date: 05/17/2024 CLINICAL DATA:  Diarrhea.  Abdominal pain. EXAM: CT ABDOMEN AND PELVIS WITH CONTRAST TECHNIQUE: Multidetector CT imaging of the abdomen and pelvis was performed using the standard protocol following bolus administration of intravenous contrast. RADIATION DOSE REDUCTION: This exam was performed according to the departmental dose-optimization program which includes automated exposure control, adjustment of the mA and/or kV according to patient size and/or use of iterative reconstruction technique. CONTRAST:  80mL OMNIPAQUE  IOHEXOL  300 MG/ML  SOLN COMPARISON:  CT 06/20/2023, chest CT 05/15/2024, abdominopelvic CT 07/03/2022 FINDINGS: Lower chest: Left greater than right pleural effusions have developed from prior chest CT. Consolidation in the left lower lobe is greater than typically seen with atelectasis. Hepatobiliary: Normal appearance of the liver. Decompressed gallbladder. No calcified gallstone. No pericholecystic inflammation. No biliary dilatation. Pancreas: Unremarkable. No pancreatic ductal dilatation or surrounding inflammatory changes. Spleen: Normal in size without focal abnormality. Adrenals/Urinary Tract: Heterogeneous left adrenal nodule measures 2.3 x 2.1 cm, series 2, image 22 previously 2.3 x 1.9 cm. This has both hypodense and enhancing components. The right adrenal gland is normal. No hydronephrosis or renal calculi. Scattered areas of scarring in the right kidney again seen. No suspicious renal lesion. Unremarkable urinary bladder. Stomach/Bowel: Decompressed stomach, equivocal areas of gastric wall thickening. No small bowel obstruction, wall thickening, or inflammation. Administered enteric contrast is seen throughout the colon. Colonic diverticulosis without diverticulitis. Sigmoid  colon is redundant. The appendix is normal. Vascular/Lymphatic: Aortic atherosclerosis. No aortic aneurysm. Patent portal, splenic, and mesenteric veins. No abdominopelvic adenopathy. Reproductive: Atrophic uterus, normal for age. Suspected fluid in the vaginal canal. No adnexal mass. Other: Small amount of free fluid in the pelvis. No abdominal ascites. Small fat containing umbilical hernia. Musculoskeletal: Diffuse facet hypertrophy. Chronic bilateral hip arthropathy. There are no acute or suspicious osseous abnormalities. IMPRESSION: 1. Left greater than right pleural effusions have developed from prior chest CT. Consolidation in the left lower lobe is greater than typically seen with atelectasis, suspicious for pneumonia. 2. Equivocal areas of gastric wall thickening, can be seen with gastritis. 3. Colonic diverticulosis without diverticulitis. 4. Heterogeneous left adrenal nodule measures 2.3 x 2.1 cm, previously 2.3 x 1.9 cm. This has both hypodense and enhancing components. This is not significantly changed in size from January 2024 CT, favoring a benign etiology. 5. Small amount of free fluid in the pelvis, nonspecific. Aortic Atherosclerosis (ICD10-I70.0). Electronically Signed   By: Andrea Gasman M.D.   On: 05/17/2024 23:48   CT CHEST WO CONTRAST Result Date: 05/15/2024 EXAM: CT CHEST WITHOUT CONTRAST 05/15/2024 10:07:13 PM TECHNIQUE: CT of the chest was performed without the administration of intravenous contrast. Multiplanar reformatted images are provided for review. Automated exposure control, iterative reconstruction, and/or weight based adjustment of the mA/kV was utilized to reduce the radiation dose to as low as reasonably achievable. COMPARISON: Comparison with prior CT dated 06/20/2023 and same day x-ray. CLINICAL HISTORY: Abnormal xray - lung opacity/opacities. FINDINGS: MEDIASTINUM: Heart and pericardium are unremarkable. Coronary artery and aortic atherosclerotic calcifications.  Enlarged heterogeneous thyroid, 2 cm more than prior. Debris in the trachea extending into the bilateral mainstem bronchi. LYMPH NODES: No mediastinal, hilar or axillary lymphadenopathy. LUNGS AND PLEURA: Centrilobular and paraseptal emphysema. Irregular consolidation and associated interlobular septal thickening in the left upper lobe. The solid area of consolidation measures 2.9 x 2.3  cm on series 2 image 57. Bibasilar atelectasis/scarring. No pleural effusion or pneumothorax. SOFT TISSUES/BONES: No acute abnormality of the bones or soft tissues. UPPER ABDOMEN: Limited images of the upper abdomen demonstrates no acute abnormality. IMPRESSION: 1. Irregular left upper lobe consolidation with associated interlobular septal thickening measuring 2.9 x 2.3 cm. Primary differential considerations are pneumonia versus malignancy. Recommend short-interval non-contrast chest CT in 6-8 weeks to ensure resolution after antibiotic treatment. 2. Debris within the trachea extending into both mainstem bronchi, which may reflect aspiration. 3. Enlarged heterogeneous thyroid. Non-emergent thyroid ultrasound is recommended if not performed previously. Electronically signed by: Norman Gatlin MD 05/15/2024 10:21 PM EST RP Workstation: [id number]   DG Chest Port 1 View Result Date: 05/15/2024 EXAM: 1 VIEW(S) XRAY OF THE CHEST 05/15/2024 08:30:57 PM COMPARISON: 06/20/2023 CLINICAL HISTORY: Questionable sepsis - evaluate for abnormality FINDINGS: LUNGS AND PLEURA: Interval development of hazy airspace opacity within left mid lung with associated interstitial thickening in the left lung. No pleural effusion. No pneumothorax. HEART AND MEDIASTINUM: Atherosclerotic plaque noted. No acute abnormality of the cardiac and mediastinal silhouettes. BONES AND SOFT TISSUES: No acute osseous abnormality. IMPRESSION: 1. Interval development of hazy airspace opacity within the left mid lung with associated interstitial thickening. This is favored  due to pneumonia however a mass is not excluded. Recommend CT for further evaluation. Electronically signed by: Norman Gatlin MD 05/15/2024 08:36 PM EST RP Workstation: [id number]     TODAY-DAY OF DISCHARGE:  Subjective:   Cassidy Larson today has no headache,no chest abdominal pain,no new weakness tingling or numbness, feels much better wants to go home today.   Objective:   Blood pressure (!) 84/62, pulse 89, temperature (!) 97.2 F (36.2 C), temperature source Oral, resp. rate 16, height 5' (1.524 m), weight 45.7 kg, SpO2 92%. No intake or output data in the 24 hours ending 06/03/24 1143 Filed Weights   05/24/24 0500 05/25/24 0456 05/26/24 0400  Weight: 46.8 kg 46.9 kg 45.7 kg    Exam: Awake Alert, Oriented *3, No new F.N deficits, Normal affect [place].AT,PERRAL Supple Neck,No JVD, No cervical lymphadenopathy appriciated.  Symmetrical Chest wall movement, Good air movement bilaterally, CTAB RRR,No Gallops,Rubs or new Murmurs, No Parasternal Heave +ve B.Sounds, Abd Soft, Non tender, No organomegaly appriciated, No rebound -guarding or rigidity. No Cyanosis, Clubbing or edema, No new Rash or bruise   PERTINENT RADIOLOGIC STUDIES: No results found.   PERTINENT LAB RESULTS: CBC: No results for input(s): WBC, HGB, HCT, PLT in the last 72 hours. CMET CMP     Component Value Date/Time   NA 141 05/26/2024 0407   K 4.4 05/26/2024 0407   CL 114 (H) 05/26/2024 0407   CO2 19 (L) 05/26/2024 0407   GLUCOSE 126 (H) 05/26/2024 0407   BUN 43 (H) 05/26/2024 0407   CREATININE 1.80 (H) 05/26/2024 0407   CALCIUM  8.4 (L) 05/26/2024 0407   CALCIUM  9.2 07/30/2023 0000   PROT 6.9 05/22/2024 0732   ALBUMIN 3.2 (L) 05/22/2024 0732   AST 47 (H) 05/22/2024 0732   ALT 52 (H) 05/22/2024 0732   ALKPHOS 105 05/22/2024 0732   BILITOT 0.3 05/22/2024 0732   EGFR 46.0 07/30/2023 0000   GFRNONAA 31 (L) 05/26/2024 0407    GFR Estimated Creatinine Clearance: 22.1 mL/min (A) (by C-G  formula based on SCr of 1.8 mg/dL (H)). No results for input(s): LIPASE, AMYLASE in the last 72 hours. No results for input(s): CKTOTAL, CKMB, CKMBINDEX, TROPONINI in the last 72 hours. Invalid input(s): POCBNP No results for  input(s): DDIMER in the last 72 hours. No results for input(s): HGBA1C in the last 72 hours. No results for input(s): CHOL, HDL, LDLCALC, TRIG, CHOLHDL, LDLDIRECT in the last 72 hours. No results for input(s): TSH, T4TOTAL, T3FREE, THYROIDAB in the last 72 hours.  Invalid input(s): FREET3 No results for input(s): VITAMINB12, FOLATE, FERRITIN, TIBC, IRON, RETICCTPCT in the last 72 hours. Coags: No results for input(s): INR in the last 72 hours.  Invalid input(s): PT Microbiology: No results found for this or any previous visit (from the past 240 hours).  FURTHER DISCHARGE INSTRUCTIONS:  Get Medicines reviewed and adjusted: Please take all your medications with you for your next visit with your Primary MD  Laboratory/radiological data: Please request your Primary MD to go over all hospital tests and procedure/radiological results at the follow up, please ask your Primary MD to get all Hospital records sent to his/her office.  In some cases, they will be blood work, cultures and biopsy results pending at the time of your discharge. Please request that your primary care M.D. goes through all the records of your hospital data and follows up on these results.  Also Note the following: If you experience worsening of your admission symptoms, develop shortness of breath, life threatening emergency, suicidal or homicidal thoughts you must seek medical attention immediately by calling 911 or calling your MD immediately  if symptoms less severe.  You must read complete instructions/literature along with all the possible adverse reactions/side effects for all the Medicines you take and that have been prescribed to you.  Take any new Medicines after you have completely understood and accpet all the possible adverse reactions/side effects.   Do not drive when taking Pain medications or sleeping medications (Benzodaizepines)  Do not take more than prescribed Pain, Sleep and Anxiety Medications. It is not advisable to combine anxiety,sleep and pain medications without talking with your primary care practitioner  Special Instructions: If you have smoked or chewed Tobacco  in the last 2 yrs please stop smoking, stop any regular Alcohol   and or any Recreational drug use.  Wear Seat belts while driving.  Please note: You were cared for by a hospitalist during your hospital stay. Once you are discharged, your primary care physician will handle any further medical issues. Please note that NO REFILLS for any discharge medications will be authorized once you are discharged, as it is imperative that you return to your primary care physician (or establish a relationship with a primary care physician if you do not have one) for your post hospital discharge needs so that they can reassess your need for medications and monitor your lab values.  Total Time spent coordinating discharge including counseling, education and face to face time equals greater than 30 minutes.  Signed: Donalda Applebaum 06/03/2024 11:43 AM      [1]  Allergies Allergen Reactions   Dorethia Gulling ] Diarrhea, Nausea And Vomiting and Other (See Comments)    Severe stomach cramps Diarrhea (sometimes with blood)   Latex Itching   Other Rash    Cotton fabric - rash

## 2024-06-03 NOTE — TOC Transition Note (Signed)
 Transition of Care [facility]) - Discharge Note   Patient Details  Name: Cassidy Larson MRN: [id number] Date of Birth: 1955/08/04  Transition of Care [facility] CM/SW Contact:  Inocente GORMAN Kindle, LCSW Phone Number: 06/03/2024, 1:30 PM   Clinical Narrative:    Patient will DC to: [facility] Anticipated DC date: 06/03/24 Family notified: Guardian, [place] Hildreth Sharps Transport by: [place]   Per MD patient ready for DC to [facility]. RN to call report prior to discharge [phone]). RN, patient, patient's family, and facility notified of DC. Discharge Summary sent to facility. DC packet on chart including signed DNR. Ambulance transport requested for patient.   CSW will sign off for now as social work intervention is no longer needed. Please consult us  again if new needs arise.     Final next level of care: Hospice Medical Facility Barriers to Discharge: Barriers Resolved   Patient Goals and CMS Choice Patient states their goals for this hospitalization and ongoing recovery are:: COMFORT CARE          Discharge Placement                Patient to be transferred to facility by: [facility] Name of family member notified: Guardian DSS Patient and family notified of of transfer: 06/03/24  Discharge Plan and Services Additional resources added to the After Visit Summary for   In-house Referral: Clinical Social Work, Hospice / Palliative Care   Post Acute Care Choice: Hospice                               Social Drivers of Health (SDOH) Interventions SDOH Screenings   Food Insecurity: No Food Insecurity (05/22/2024)  Housing: Unknown (05/22/2024)  Transportation Needs: No Transportation Needs (05/22/2024)  Recent Concern: Transportation Needs - Unmet Transportation Needs (05/15/2024)  Utilities: Not At Risk (05/22/2024)  Financial Resource Strain: Not on File (11/14/2022)   Received from [facility]  Physical Activity: Not on File (11/14/2022)   Received from [facility]  Social  Connections: Socially Isolated (05/22/2024)  Stress: Not on File (11/14/2022)   Received from [facility]  Tobacco Use: High Risk (05/15/2024)     Readmission Risk Interventions    05/16/2024    2:59 PM  Readmission Risk Prevention Plan  Transportation Screening Complete  PCP or Specialist Appt within 5-7 Days Not Complete  Home Care Screening Complete  Medication Review (RN CM) Complete

## 2024-06-03 NOTE — Plan of Care (Signed)
°  Problem: Clinical Measurements: Goal: Will remain free from infection Outcome: Progressing   Problem: Pain Managment: Goal: General experience of comfort will improve and/or be controlled Outcome: Progressing   Problem: Respiratory: Goal: Verbalizations of increased ease of respirations will increase Outcome: Progressing
